# Patient Record
Sex: Male | Born: 1955
Health system: Southern US, Community
[De-identification: ages and names within clinical notes are randomized; demographics above are authoritative.]

## PROBLEM LIST (undated history)

## (undated) DIAGNOSIS — B019 Varicella without complication: Secondary | ICD-10-CM

## (undated) DIAGNOSIS — A389 Scarlet fever, uncomplicated: Secondary | ICD-10-CM

## (undated) DIAGNOSIS — H269 Unspecified cataract: Secondary | ICD-10-CM

## (undated) DIAGNOSIS — B069 Rubella without complication: Secondary | ICD-10-CM

## (undated) DIAGNOSIS — E1065 Type 1 diabetes mellitus with hyperglycemia: Secondary | ICD-10-CM

## (undated) DIAGNOSIS — R519 Headache, unspecified: Secondary | ICD-10-CM

## (undated) DIAGNOSIS — N1411 Contrast-induced nephropathy: Secondary | ICD-10-CM

## (undated) DIAGNOSIS — I509 Heart failure, unspecified: Secondary | ICD-10-CM

## (undated) DIAGNOSIS — I951 Orthostatic hypotension: Secondary | ICD-10-CM

## (undated) DIAGNOSIS — B269 Mumps without complication: Secondary | ICD-10-CM

## (undated) DIAGNOSIS — E785 Hyperlipidemia, unspecified: Secondary | ICD-10-CM

## (undated) DIAGNOSIS — G4733 Obstructive sleep apnea (adult) (pediatric): Principal | ICD-10-CM

## (undated) DIAGNOSIS — T508X5A Adverse effect of diagnostic agents, initial encounter: Secondary | ICD-10-CM

## (undated) DIAGNOSIS — I219 Acute myocardial infarction, unspecified: Secondary | ICD-10-CM

## (undated) DIAGNOSIS — IMO0002 Reserved for concepts with insufficient information to code with codable children: Secondary | ICD-10-CM

## (undated) DIAGNOSIS — E11319 Type 2 diabetes mellitus with unspecified diabetic retinopathy without macular edema: Secondary | ICD-10-CM

## (undated) DIAGNOSIS — I2510 Atherosclerotic heart disease of native coronary artery without angina pectoris: Secondary | ICD-10-CM

## (undated) DIAGNOSIS — N141 Nephropathy induced by other drugs, medicaments and biological substances: Secondary | ICD-10-CM

## (undated) DIAGNOSIS — R51 Headache: Secondary | ICD-10-CM

## (undated) HISTORY — DX: Atherosclerotic heart disease of native coronary artery without angina pectoris: I25.10

## (undated) HISTORY — DX: Varicella without complication: B01.9

## (undated) HISTORY — DX: Headache, unspecified: R51.9

## (undated) HISTORY — DX: Acute myocardial infarction, unspecified: I21.9

## (undated) HISTORY — DX: Scarlet fever, uncomplicated: A38.9

## (undated) HISTORY — DX: Hyperlipidemia, unspecified: E78.5

## (undated) HISTORY — PX: WISDOM TOOTH EXTRACTION: SHX21

## (undated) HISTORY — DX: Type 1 diabetes mellitus with hyperglycemia: E10.65

## (undated) HISTORY — DX: Unspecified cataract: H26.9

## (undated) HISTORY — DX: Mumps without complication: B26.9

## (undated) HISTORY — DX: Obstructive sleep apnea (adult) (pediatric): G47.33

## (undated) HISTORY — DX: Type 2 diabetes mellitus with unspecified diabetic retinopathy without macular edema: E11.319

## (undated) HISTORY — DX: Orthostatic hypotension: I95.1

## (undated) HISTORY — DX: Reserved for concepts with insufficient information to code with codable children: IMO0002

## (undated) HISTORY — DX: Rubella without complication: B06.9

## (undated) HISTORY — DX: Contrast-induced nephropathy: N14.11

## (undated) HISTORY — PX: CORONARY ANGIOPLASTY WITH STENT PLACEMENT: SHX49

## (undated) HISTORY — PX: REFRACTIVE SURGERY: SHX103

## (undated) HISTORY — DX: Adverse effect of diagnostic agents, initial encounter: T50.8X5A

## (undated) HISTORY — DX: Headache: R51

## (undated) HISTORY — DX: Nephropathy induced by other drugs, medicaments and biological substances: N14.1

---
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
## 2012-11-24 DIAGNOSIS — I219 Acute myocardial infarction, unspecified: Secondary | ICD-10-CM

## 2012-11-24 HISTORY — DX: Acute myocardial infarction, unspecified: I21.9

## 2013-03-29 ENCOUNTER — Encounter: Payer: Self-pay | Admitting: Physician Assistant

## 2013-03-29 ENCOUNTER — Ambulatory Visit (INDEPENDENT_AMBULATORY_CARE_PROVIDER_SITE_OTHER): Payer: No Typology Code available for payment source | Admitting: Physician Assistant

## 2013-03-29 VITALS — BP 82/58 | HR 68 | Temp 98.6°F | Resp 16 | Ht 66.0 in | Wt 169.2 lb

## 2013-03-29 DIAGNOSIS — Z299 Encounter for prophylactic measures, unspecified: Secondary | ICD-10-CM

## 2013-03-29 DIAGNOSIS — F3289 Other specified depressive episodes: Secondary | ICD-10-CM

## 2013-03-29 DIAGNOSIS — G4700 Insomnia, unspecified: Secondary | ICD-10-CM

## 2013-03-29 DIAGNOSIS — G589 Mononeuropathy, unspecified: Secondary | ICD-10-CM

## 2013-03-29 DIAGNOSIS — Z7189 Other specified counseling: Secondary | ICD-10-CM

## 2013-03-29 DIAGNOSIS — E785 Hyperlipidemia, unspecified: Secondary | ICD-10-CM

## 2013-03-29 DIAGNOSIS — G2581 Restless legs syndrome: Secondary | ICD-10-CM

## 2013-03-29 DIAGNOSIS — E119 Type 2 diabetes mellitus without complications: Secondary | ICD-10-CM

## 2013-03-29 DIAGNOSIS — F329 Major depressive disorder, single episode, unspecified: Secondary | ICD-10-CM

## 2013-03-29 DIAGNOSIS — G894 Chronic pain syndrome: Secondary | ICD-10-CM

## 2013-03-29 DIAGNOSIS — I951 Orthostatic hypotension: Secondary | ICD-10-CM

## 2013-03-29 DIAGNOSIS — Z7689 Persons encountering health services in other specified circumstances: Secondary | ICD-10-CM

## 2013-03-29 DIAGNOSIS — G629 Polyneuropathy, unspecified: Secondary | ICD-10-CM

## 2013-03-29 DIAGNOSIS — I2510 Atherosclerotic heart disease of native coronary artery without angina pectoris: Secondary | ICD-10-CM

## 2013-03-29 DIAGNOSIS — F32A Depression, unspecified: Secondary | ICD-10-CM

## 2013-03-29 LAB — CBC WITH DIFFERENTIAL/PLATELET
BASOS ABS: 0 10*3/uL (ref 0.0–0.1)
Basophils Relative: 1 % (ref 0–1)
EOS ABS: 0.2 10*3/uL (ref 0.0–0.7)
EOS PCT: 3 % (ref 0–5)
HEMATOCRIT: 39.3 % (ref 39.0–52.0)
Hemoglobin: 13.2 g/dL (ref 13.0–17.0)
LYMPHS ABS: 1.6 10*3/uL (ref 0.7–4.0)
Lymphocytes Relative: 27 % (ref 12–46)
MCH: 28.2 pg (ref 26.0–34.0)
MCHC: 33.6 g/dL (ref 30.0–36.0)
MCV: 84 fL (ref 78.0–100.0)
Monocytes Absolute: 0.4 10*3/uL (ref 0.1–1.0)
Monocytes Relative: 6 % (ref 3–12)
Neutro Abs: 3.8 10*3/uL (ref 1.7–7.7)
Neutrophils Relative %: 63 % (ref 43–77)
Platelets: 226 10*3/uL (ref 150–400)
RBC: 4.68 MIL/uL (ref 4.22–5.81)
RDW: 15 % (ref 11.5–15.5)
WBC: 6 10*3/uL (ref 4.0–10.5)

## 2013-03-29 MED ORDER — HYDROCODONE-ACETAMINOPHEN 10-325 MG PO TABS: 1.0000 | ORAL_TABLET | Freq: Three times a day (TID) | ORAL | Status: DC | PRN

## 2013-03-29 NOTE — Patient Instructions (Addendum)
Please obtain labs.  I will call you with your results. Continue medications.  Increase Midodrine to 10 mg three times daily.  Continue increasing fluids.  You will be contacted by Cardiology, Sleep Study and Pain management.  I will let you know when I have reviewed your records.  Hypertension As your heart beats, it forces blood through your arteries. This force is your blood pressure. If the pressure is too high, it is called hypertension (HTN) or high blood pressure. HTN is dangerous because you may have it and not know it. High blood pressure may mean that your heart has to work harder to pump blood. Your arteries may be narrow or stiff. The extra work puts you at risk for heart disease, stroke, and other problems.  Blood pressure consists of two numbers, a higher number over a lower, 110/72, for example. It is stated as "110 over 72." The ideal is below 120 for the top number (systolic) and under 80 for the bottom (diastolic). Write down your blood pressure today. You should pay close attention to your blood pressure if you have certain conditions such as:  Heart failure.  Prior heart attack.  Diabetes  Chronic kidney disease.  Prior stroke.  Multiple risk factors for heart disease. To see if you have HTN, your blood pressure should be measured while you are seated with your arm held at the level of the heart. It should be measured at least twice. A one-time elevated blood pressure reading (especially in the Emergency Department) does not mean that you need treatment. There may be conditions in which the blood pressure is different between your right and left arms. It is important to see your caregiver soon for a recheck. Most people have essential hypertension which means that there is not a specific cause. This type of high blood pressure may be lowered by changing lifestyle factors such as:  Stress.  Smoking.  Lack of exercise.  Excessive weight.  Drug/tobacco/alcohol  use.  Eating less salt. Most people do not have symptoms from high blood pressure until it has caused damage to the body. Effective treatment can often prevent, delay or reduce that damage. TREATMENT  When a cause has been identified, treatment for high blood pressure is directed at the cause. There are a large number of medications to treat HTN. These fall into several categories, and your caregiver will help you select the medicines that are best for you. Medications may have side effects. You should review side effects with your caregiver. If your blood pressure stays high after you have made lifestyle changes or started on medicines,   Your medication(s) may need to be changed.  Other problems may need to be addressed.  Be certain you understand your prescriptions, and know how and when to take your medicine.  Be sure to follow up with your caregiver within the time frame advised (usually within two weeks) to have your blood pressure rechecked and to review your medications.  If you are taking more than one medicine to lower your blood pressure, make sure you know how and at what times they should be taken. Taking two medicines at the same time can result in blood pressure that is too low. SEEK IMMEDIATE MEDICAL CARE IF:  You develop a severe headache, blurred or changing vision, or confusion.  You have unusual weakness or numbness, or a faint feeling.  You have severe chest or abdominal pain, vomiting, or breathing problems. MAKE SURE YOU:   Understand these instructions.  Will watch your condition.  Will get help right away if you are not doing well or get worse. Document Released: 02/28/2005 Document Revised: 05/23/2011 Document Reviewed: 10/19/2007 [facility] Patient Information 2014 [place].

## 2013-03-29 NOTE — Progress Notes (Signed)
Pre visit review using our clinic review tool, if applicable. No additional management support is needed unless otherwise documented below in the visit note/SLS  

## 2013-03-30 LAB — BASIC METABOLIC PANEL
BUN: 15 mg/dL (ref 6–23)
CHLORIDE: 101 meq/L (ref 96–112)
CO2: 31 meq/L (ref 19–32)
CREATININE: 1.14 mg/dL (ref 0.50–1.35)
Calcium: 8.8 mg/dL (ref 8.4–10.5)
Glucose, Bld: 113 mg/dL — ABNORMAL HIGH (ref 70–99)
Potassium: 4.6 mEq/L (ref 3.5–5.3)
Sodium: 139 mEq/L (ref 135–145)

## 2013-03-30 LAB — FERRITIN: Ferritin: 87 ng/mL (ref 22–322)

## 2013-04-01 ENCOUNTER — Telehealth: Payer: Self-pay | Admitting: *Deleted

## 2013-04-01 NOTE — Telephone Encounter (Signed)
Pt's spouse reports that they forgot to request refill on Sertraline when in office last week; pharmacy states that we need to send Rx in, patient does not need until next week/SLS Will Hold until time for refill.

## 2013-04-02 ENCOUNTER — Telehealth: Payer: Self-pay | Admitting: Physician Assistant

## 2013-04-02 DIAGNOSIS — E1140 Type 2 diabetes mellitus with diabetic neuropathy, unspecified: Secondary | ICD-10-CM

## 2013-04-02 NOTE — Telephone Encounter (Signed)
Patient wife called in stating that she hasn't heard from our office regarding neurology,pain clinic, and cardiology referrals. I do not see any referrals in patient chart.

## 2013-04-02 NOTE — Telephone Encounter (Signed)
Notified pt's wife and she voices understanding. She states pt continues to be in a lot of pain from his restless leg and diabetic neuropathy. They alternated the hydrocodone with tylenol through the night as she states the pain was too intense and pt was not able to go to sleep until 5 am this morning.

## 2013-04-02 NOTE — Telephone Encounter (Signed)
Referrals placed -- The orders had pended for some reason.  I made sure they were signed. They will be contacted hopefully at some point tomorrow or Thursday.  Pain Clinic referral may take longer. I will be back in the morning and call the patient and his wife personally to discuss patient's options for treatment of restless leg syndrome.  His iron level looked good.

## 2013-04-03 MED ORDER — DULOXETINE HCL 30 MG PO CPEP: ORAL_CAPSULE | ORAL | Status: DC

## 2013-04-03 NOTE — Telephone Encounter (Signed)
Spoke with patient and his wife about medications. Will start 30 mg capsule of Cymbalta daily for 7 days, while decreasing Zoloft to 1/2 tablet daily (50 mg) for 1 week.  Will discontinue Zoloft after 7 days, while increasing to 60 mg of the Cymbalta.

## 2013-04-03 NOTE — Telephone Encounter (Signed)
Requesting lab work results °

## 2013-04-05 MED ORDER — HYDROCODONE-ACETAMINOPHEN 10-325 MG PO TABS: 1.0000 | ORAL_TABLET | Freq: Four times a day (QID) | ORAL | Status: DC | PRN

## 2013-04-07 DIAGNOSIS — F32A Depression, unspecified: Secondary | ICD-10-CM | POA: Insufficient documentation

## 2013-04-07 DIAGNOSIS — G4700 Insomnia, unspecified: Secondary | ICD-10-CM | POA: Insufficient documentation

## 2013-04-07 DIAGNOSIS — E785 Hyperlipidemia, unspecified: Secondary | ICD-10-CM | POA: Insufficient documentation

## 2013-04-07 DIAGNOSIS — I951 Orthostatic hypotension: Secondary | ICD-10-CM | POA: Insufficient documentation

## 2013-04-07 DIAGNOSIS — Z9861 Coronary angioplasty status: Secondary | ICD-10-CM

## 2013-04-07 DIAGNOSIS — F329 Major depressive disorder, single episode, unspecified: Secondary | ICD-10-CM | POA: Insufficient documentation

## 2013-04-07 DIAGNOSIS — Z7689 Persons encountering health services in other specified circumstances: Secondary | ICD-10-CM | POA: Insufficient documentation

## 2013-04-07 DIAGNOSIS — I2510 Atherosclerotic heart disease of native coronary artery without angina pectoris: Secondary | ICD-10-CM | POA: Insufficient documentation

## 2013-04-07 DIAGNOSIS — E1129 Type 2 diabetes mellitus with other diabetic kidney complication: Secondary | ICD-10-CM | POA: Insufficient documentation

## 2013-04-07 DIAGNOSIS — G629 Polyneuropathy, unspecified: Secondary | ICD-10-CM | POA: Insufficient documentation

## 2013-04-07 DIAGNOSIS — G894 Chronic pain syndrome: Secondary | ICD-10-CM | POA: Insufficient documentation

## 2013-04-07 NOTE — Assessment & Plan Note (Signed)
Severe pain.  Possible concomitant RLS.  Will check iron level.  Discussed possibility of addition of Cymbalta in place of Sertraline for depressive symptoms to help with neuropathic pain.  Referral to Neurology.

## 2013-04-07 NOTE — Assessment & Plan Note (Signed)
BP low in clinic.  Will make referral to Cardiology for medication management of Anti-HTN agents due to patient's significant history of multiple MI.  Will increase Midodrine to 10 mg TID.  Follow-up in 2 weeks.

## 2013-04-07 NOTE — Assessment & Plan Note (Signed)
Good relief with Ambien. Will continue current medication regimen for now.

## 2013-04-07 NOTE — Assessment & Plan Note (Signed)
Patient followed by Endocrinology.  Will continue current regimen.

## 2013-04-07 NOTE — Assessment & Plan Note (Signed)
Rx Norco.  Will place referral to Pain Clinic in case longer-term pain medications are needed.

## 2013-04-07 NOTE — Progress Notes (Signed)
Patient presents to clinic today to establish care.  Acute Concerns: Diabetic Neuropathy -- Sever pain.  Patient currently on Gabapentin 900 mg at breakfast and dinner and 600 mg at lunch.  Endorses continued symptoms.  Patient needs referral to Neurology and Pain Clinic.  Patient endorses trying multiple medications in the past both oral and topical.  Patient has not been on combination of Cymbalta and Gabapentin. Patient also endorses RLS.  Denies history of iron deficiency.  Worst burning symptoms are in his hands and fingers bilaterally.   Chronic Issues: Diabetes Mellitus -- Patient currently on insulin pump of Humalog. Followed by Endocrinology at [facility].  Patient has complications of neuropathy and retinopathy. Patient followed by Opthalmology. Denies vision changes.  Insomnia -- Patient currently on Ambien nightly.  Patient rests well with medication except when he is having a flare-up of his neuropathic pain.  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 Patient currently on ASA 81 mg, Carvedilol 6.25 mg BID, Plavix 75 mg, Crestor 10 mg daily.  Patient also on Midodrine to help prevent Orthostatic hypotension.  Patient's BP is 82/58 in clinic today.  Denies lightheadedness, dizziness, chest pain, palpitations or shortness of breath.  Needs cardiology referral placed.  Health Maintenance: Dental -- UTD Vision -- UTD Immunizations -- UTD Colonoscopy -- UTD.  Past Medical History  Diagnosis Date  . Diabetes type 1, uncontrolled   . Frequent headaches   . Heart disease   . Heart attack 09.13.2014    x5  . Hypertension   . Hyperlipidemia   . Chicken pox   . Mumps   . Measles, [place] (rubella)   . Scarlet fever   . Diabetic retinopathy   . Cataracts, bilateral     Past Surgical History  Procedure Laterality Date  . Coronary angioplasty with stent placement    . Appendectomy  1966  . Wisdom tooth extraction    . Refractive  surgery      Retinopathy    No current outpatient prescriptions on file prior to visit.   No current facility-administered medications on file prior to visit.    No Known Allergies  Family History  Problem Relation Age of Onset  . Vascular Disease Father 49    Deceased  . Transient ischemic attack Father   . Hypertension  74    Deceased  . Liver cancer Paternal Grandfather   . Breast cancer Sister   . Heart disease Sister   . Healthy Brother     x1    History   Social History  . Marital Status: Married    Spouse Name: N/A    Number of Children: N/A  . Years of Education: N/A   Occupational History  . Not on file.   Social History Main Topics  . Smoking status: Never Smoker   . Smokeless tobacco: Not on file  . Alcohol Use: Not on file  . Drug Use: Not on file  . Sexual Activity: Not on file   Other Topics Concern  . Not on file   Social History Narrative  . No narrative on file   Review of Systems  Constitutional: Positive for malaise/fatigue. Negative for fever and weight loss.  HENT: Negative for ear pain, hearing loss and tinnitus.   Eyes: Negative for blurred vision, double vision, photophobia and pain.  Respiratory: Negative for cough, shortness of breath and wheezing.   Cardiovascular: Negative for chest pain and palpitations.  Gastrointestinal: Negative for heartburn, nausea, vomiting, abdominal pain, diarrhea, constipation, blood in stool and melena.  Genitourinary: Negative.   Musculoskeletal: Positive for myalgias.  Neurological: Positive for tingling and sensory change. Negative for dizziness, tremors, speech change, focal weakness, seizures, loss of consciousness and headaches.  Endo/Heme/Allergies: Negative for environmental allergies.  Psychiatric/Behavioral: Positive for depression. Negative for suicidal ideas, hallucinations and substance abuse. The patient has insomnia. The patient is not nervous/anxious.    BP 82/58  Pulse 68   Temp(Src) 98.6 F (37 C) (Oral)  Resp 16  Ht 5' 6"  (1.676 m)  Wt 169 lb 4 oz (76.771 kg)  BMI 27.33 kg/m2  SpO2 98%  Physical Exam  Vitals reviewed. Constitutional: He is oriented to person, place, and time and well-developed, well-nourished, and in no distress.  HENT:  Head: Normocephalic and atraumatic.  Right Ear: External ear normal.  Left Ear: External ear normal.  Nose: Nose normal.  Mouth/Throat: Oropharynx is clear and moist. No oropharyngeal exudate.  Eyes: Conjunctivae are normal. Pupils are equal, round, and reactive to light.  Neck: Neck supple. No thyromegaly present.  Cardiovascular: Normal rate, regular rhythm, normal heart sounds and intact distal pulses.   Pulmonary/Chest: Effort normal and breath sounds normal. No respiratory distress. He has no wheezes. He has no rales. He exhibits no tenderness.  Lymphadenopathy:    He has no cervical adenopathy.  Neurological: He is alert and oriented to person, place, and time. No cranial nerve deficit.  Skin: Skin is warm and dry. No rash noted.  Psychiatric: Affect normal.    Recent Results (from the past 2160 hour(s))  CBC WITH DIFFERENTIAL     Status: None   Collection Time    03/29/13  5:23 PM      Result Value Range   WBC 6.0  4.0 - 10.5 K/uL   RBC 4.68  4.22 - 5.81 MIL/uL   Hemoglobin 13.2  13.0 - 17.0 g/dL   HCT 39.3  39.0 - 52.0 %   MCV 84.0  78.0 - 100.0 fL   MCH 28.2  26.0 - 34.0 pg   MCHC 33.6  30.0 - 36.0 g/dL   RDW 15.0  11.5 - 15.5 %   Platelets 226  150 - 400 K/uL   Neutrophils Relative % 63  43 - 77 %   Neutro Abs 3.8  1.7 - 7.7 K/uL   Lymphocytes Relative 27  12 - 46 %   Lymphs Abs 1.6  0.7 - 4.0 K/uL   Monocytes Relative 6  3 - 12 %   Monocytes Absolute 0.4  0.1 - 1.0 K/uL   Eosinophils Relative 3  0 - 5 %   Eosinophils Absolute 0.2  0.0 - 0.7 K/uL   Basophils Relative 1  0 - 1 %   Basophils Absolute 0.0  0.0 - 0.1 K/uL   Smear Review Criteria for review not met    BASIC METABOLIC PANEL      Status: Abnormal   Collection Time    03/29/13  5:23 PM      Result Value Range   Sodium 139  135 - 145 mEq/L   Potassium 4.6  3.5 - 5.3 mEq/L   Chloride 101  96 - 112 mEq/L   CO2 31  19 - 32 mEq/L   Glucose, Bld 113 (*) 70 - 99 mg/dL   BUN 15  6 - 23 mg/dL   Creat 1.14  0.50 - 1.35 mg/dL   Calcium 8.8  8.4 - 10.5  mg/dL  FERRITIN     Status: None   Collection Time    03/29/13  5:23 PM      Result Value Range   Ferritin 87  22 - 322 ng/mL    Assessment/Plan: No problem-specific assessment & plan notes found for this encounter.

## 2013-04-07 NOTE — Assessment & Plan Note (Signed)
Symptoms controlled on Sertraline.

## 2013-04-07 NOTE — Assessment & Plan Note (Signed)
Medical history reviewed.  Health Maintenance UTD.  Will obtain records from previous PCP and specialists.  Will obtain labs.

## 2013-04-07 NOTE — Assessment & Plan Note (Signed)
Continue current medications.  Will place referral to Cardiology.

## 2013-04-07 NOTE — Assessment & Plan Note (Signed)
Patient currently on Crestor.  Denies myalgias.

## 2013-04-08 ENCOUNTER — Telehealth: Payer: Self-pay | Admitting: *Deleted

## 2013-04-08 DIAGNOSIS — R945 Abnormal results of liver function studies: Principal | ICD-10-CM

## 2013-04-08 DIAGNOSIS — R748 Abnormal levels of other serum enzymes: Secondary | ICD-10-CM

## 2013-04-08 DIAGNOSIS — R7989 Other specified abnormal findings of blood chemistry: Secondary | ICD-10-CM

## 2013-04-08 NOTE — Telephone Encounter (Signed)
LMOM with contact name and number for return call RE: scheduling appointment and further provider instructions/SLS

## 2013-04-08 NOTE — Telephone Encounter (Signed)
Pain Clinic referral in place.  Patient started on new pain medications which will take time to work effectively.  Tessalon Perles should be ok.  I do think he should be seen for an appointment.  Make it a 30-minute appointment as patient is very complex.

## 2013-04-08 NOTE — Telephone Encounter (Signed)
Returning call, states no message was left

## 2013-04-08 NOTE — Telephone Encounter (Signed)
Timothy ClimesWilliam Cody Martin, PA-C at 04/03/2013 6:35 PM     Status: Signed        Spoke with patient and his wife about medications. Will start 30 mg capsule of Cymbalta daily for 7 days, while decreasing Zoloft to 1/2 tablet daily (50 mg) for 1 week. Will discontinue Zoloft after 7 days, while increasing to 60 mg of the Cymbalta.

## 2013-04-08 NOTE — Telephone Encounter (Signed)
Patient's spouse called reporting that: 1) pt's pain [where??] was bad this weekend, but that he had taken the medication px for it 2) Pt's CBG was elevated on Saturday up to 498, but now resolved 3) Pt started coughing bad last night, she states as a nurse, she checked him with her stethoscope and found No wheezing and/or rails; called in refill on tussin pearles [?? Not on med list] to pharmacy; is Rx Ok to take with other medications? 4) Pt's cough is dry and with chest pain [from cough], does patient need to come in for appointment [?], as would like to avoid another respiratory infection like one he had in hospital [?] Please Advise/SLS

## 2013-04-09 ENCOUNTER — Emergency Department (HOSPITAL_BASED_OUTPATIENT_CLINIC_OR_DEPARTMENT_OTHER)
Admission: EM | Admit: 2013-04-09 | Discharge: 2013-04-09 | Disposition: A | Discharge status: Home / Self Care | Payer: No Typology Code available for payment source | Attending: Emergency Medicine | Admitting: Emergency Medicine

## 2013-04-09 ENCOUNTER — Encounter: Payer: Self-pay | Admitting: Family

## 2013-04-09 ENCOUNTER — Ambulatory Visit (INDEPENDENT_AMBULATORY_CARE_PROVIDER_SITE_OTHER): Payer: No Typology Code available for payment source | Admitting: Family

## 2013-04-09 ENCOUNTER — Emergency Department (HOSPITAL_BASED_OUTPATIENT_CLINIC_OR_DEPARTMENT_OTHER): Payer: No Typology Code available for payment source

## 2013-04-09 ENCOUNTER — Encounter (HOSPITAL_BASED_OUTPATIENT_CLINIC_OR_DEPARTMENT_OTHER): Payer: Self-pay | Admitting: Emergency Medicine

## 2013-04-09 VITALS — BP 156/78 | HR 71 | Temp 98.2°F | Resp 16 | Ht 66.0 in | Wt 173.1 lb

## 2013-04-09 DIAGNOSIS — IMO0002 Reserved for concepts with insufficient information to code with codable children: Secondary | ICD-10-CM | POA: Insufficient documentation

## 2013-04-09 DIAGNOSIS — Z8619 Personal history of other infectious and parasitic diseases: Secondary | ICD-10-CM | POA: Insufficient documentation

## 2013-04-09 DIAGNOSIS — E1039 Type 1 diabetes mellitus with other diabetic ophthalmic complication: Secondary | ICD-10-CM | POA: Insufficient documentation

## 2013-04-09 DIAGNOSIS — I252 Old myocardial infarction: Secondary | ICD-10-CM | POA: Insufficient documentation

## 2013-04-09 DIAGNOSIS — J189 Pneumonia, unspecified organism: Secondary | ICD-10-CM

## 2013-04-09 DIAGNOSIS — Z7982 Long term (current) use of aspirin: Secondary | ICD-10-CM | POA: Insufficient documentation

## 2013-04-09 DIAGNOSIS — R0789 Other chest pain: Secondary | ICD-10-CM

## 2013-04-09 DIAGNOSIS — J159 Unspecified bacterial pneumonia: Secondary | ICD-10-CM | POA: Insufficient documentation

## 2013-04-09 DIAGNOSIS — R059 Cough, unspecified: Secondary | ICD-10-CM

## 2013-04-09 DIAGNOSIS — I10 Essential (primary) hypertension: Secondary | ICD-10-CM | POA: Insufficient documentation

## 2013-04-09 DIAGNOSIS — Z79899 Other long term (current) drug therapy: Secondary | ICD-10-CM | POA: Insufficient documentation

## 2013-04-09 DIAGNOSIS — Z7902 Long term (current) use of antithrombotics/antiplatelets: Secondary | ICD-10-CM | POA: Insufficient documentation

## 2013-04-09 DIAGNOSIS — E11319 Type 2 diabetes mellitus with unspecified diabetic retinopathy without macular edema: Secondary | ICD-10-CM | POA: Insufficient documentation

## 2013-04-09 DIAGNOSIS — R05 Cough: Secondary | ICD-10-CM | POA: Insufficient documentation

## 2013-04-09 DIAGNOSIS — H269 Unspecified cataract: Secondary | ICD-10-CM | POA: Insufficient documentation

## 2013-04-09 DIAGNOSIS — R079 Chest pain, unspecified: Secondary | ICD-10-CM

## 2013-04-09 DIAGNOSIS — Z794 Long term (current) use of insulin: Secondary | ICD-10-CM | POA: Insufficient documentation

## 2013-04-09 DIAGNOSIS — E785 Hyperlipidemia, unspecified: Secondary | ICD-10-CM | POA: Insufficient documentation

## 2013-04-09 DIAGNOSIS — E1065 Type 1 diabetes mellitus with hyperglycemia: Secondary | ICD-10-CM

## 2013-04-09 LAB — BASIC METABOLIC PANEL
BUN: 15 mg/dL (ref 6–23)
CALCIUM: 9 mg/dL (ref 8.4–10.5)
CO2: 26 mEq/L (ref 19–32)
Chloride: 99 mEq/L (ref 96–112)
Creatinine, Ser: 0.8 mg/dL (ref 0.50–1.35)
GFR calc Af Amer: >90 mL/min (ref >90)
GLUCOSE: 260 mg/dL — AB (ref 70–99)
Potassium: 4.2 mEq/L (ref 3.7–5.3)
SODIUM: 139 meq/L (ref 137–147)

## 2013-04-09 LAB — CBC WITH DIFFERENTIAL/PLATELET
Basophils Absolute: 0 10*3/uL (ref 0.0–0.1)
Basophils Relative: 1 % (ref 0–1)
EOS ABS: 0.2 10*3/uL (ref 0.0–0.7)
Eosinophils Relative: 4 % (ref 0–5)
HCT: 35.3 % — ABNORMAL LOW (ref 39.0–52.0)
Hemoglobin: 11.8 g/dL — ABNORMAL LOW (ref 13.0–17.0)
LYMPHS ABS: 1.1 10*3/uL (ref 0.7–4.0)
Lymphocytes Relative: 19 % (ref 12–46)
MCH: 28.9 pg (ref 26.0–34.0)
MCHC: 33.4 g/dL (ref 30.0–36.0)
MCV: 86.3 fL (ref 78.0–100.0)
MONOS PCT: 10 % (ref 3–12)
Monocytes Absolute: 0.6 10*3/uL (ref 0.1–1.0)
Neutro Abs: 3.9 10*3/uL (ref 1.7–7.7)
Neutrophils Relative %: 67 % (ref 43–77)
Platelets: 204 10*3/uL (ref 150–400)
RBC: 4.09 MIL/uL — AB (ref 4.22–5.81)
RDW: 13.4 % (ref 11.5–15.5)
WBC: 5.8 10*3/uL (ref 4.0–10.5)

## 2013-04-09 LAB — TROPONIN I: Troponin I: <0.3 ng/mL (ref <0.30)

## 2013-04-09 MED ORDER — FLUDROCORTISONE ACETATE 0.1 MG PO TABS: 0.1000 mg | ORAL_TABLET | Freq: Two times a day (BID) | ORAL | Status: DC

## 2013-04-09 MED ORDER — CEFTRIAXONE SODIUM 1 G IJ SOLR
INTRAMUSCULAR | Status: AC
  Filled 2013-04-09: qty 10

## 2013-04-09 MED ORDER — AZITHROMYCIN 250 MG PO TABS: 250.0000 mg | ORAL_TABLET | Freq: Every day | ORAL | Status: DC

## 2013-04-09 MED ORDER — AZITHROMYCIN 250 MG PO TABS
500.0000 mg | ORAL_TABLET | Freq: Once | ORAL | Status: AC
  Administered 2013-04-09: 500 mg via ORAL
  Filled 2013-04-09: qty 2

## 2013-04-09 MED ORDER — ONDANSETRON HCL 4 MG/2ML IJ SOLN
4.0000 mg | Freq: Once | INTRAMUSCULAR | Status: AC
  Administered 2013-04-09: 4 mg via INTRAMUSCULAR
  Filled 2013-04-09: qty 2

## 2013-04-09 MED ORDER — DEXTROSE 5 % IV SOLN
1.0000 g | INTRAVENOUS | Status: DC
  Administered 2013-04-09: 1 g via INTRAVENOUS

## 2013-04-09 MED ORDER — HYDROMORPHONE HCL PF 1 MG/ML IJ SOLN
1.0000 mg | Freq: Once | INTRAMUSCULAR | Status: AC
  Administered 2013-04-09: 1 mg via INTRAVENOUS
  Filled 2013-04-09: qty 1

## 2013-04-09 MED ORDER — HYDROCODONE-ACETAMINOPHEN 5-325 MG PO TABS: 2.0000 | ORAL_TABLET | ORAL | Status: DC | PRN

## 2013-04-09 NOTE — Progress Notes (Signed)
Pre visit review using our clinic review tool, if applicable. No additional management support is needed unless otherwise documented below in the visit note. 

## 2013-04-09 NOTE — Telephone Encounter (Signed)
Pt was seen in clinic by NP today for cough. Do you still need to see him for a 30 minute visit?

## 2013-04-09 NOTE — ED Provider Notes (Signed)
CSN: [id number]     Arrival date & time 04/09/13  1442 History   First MD Initiated Contact with Patient 04/09/13 1548     Chief Complaint  Patient presents with  . Shortness of Breath   (Consider location/radiation/quality/duration/timing/severity/associated sxs/prior Treatment) Patient is a 58 y.o. male presenting with shortness of breath. The history is provided by the patient. No language interpreter was used.  Shortness of Breath Severity:  Moderate Onset quality:  Gradual Duration:  3 days Timing:  Constant Progression:  Worsening Chronicity:  New Relieved by:  Nothing Worsened by:  Nothing tried Ineffective treatments:  None tried Associated symptoms: cough   Associated symptoms: no vomiting   Pt complains of a cough and congestion.  Pt reports he is short of breath today.    Past Medical History  Diagnosis Date  . Diabetes type 1, uncontrolled   . Frequent headaches   . Heart disease   . Heart attack 09.13.2014    x5  . Hypertension   . Hyperlipidemia   . Chicken pox   . Mumps   . Measles, [place] (rubella)   . Scarlet fever   . Diabetic retinopathy   . Cataracts, bilateral    Past Surgical History  Procedure Laterality Date  . Coronary angioplasty with stent placement    . Appendectomy  1966  . Wisdom tooth extraction    . Refractive surgery      Retinopathy   Family History  Problem Relation Age of Onset  . Vascular Disease Father 43    Deceased  . Transient ischemic attack Father   . Hypertension  38    Deceased  . Liver cancer Paternal Grandfather   . Breast cancer Sister   . Heart disease Sister   . Healthy Brother     x1   History  Substance Use Topics  . Smoking status: Never Smoker   . Smokeless tobacco: Not on file  . Alcohol Use: Yes    Review of Systems  Respiratory: Positive for cough and shortness of breath.   Gastrointestinal: Negative for vomiting.  All other systems reviewed and are negative.    Allergies  Review of  patient's allergies indicates no known allergies.  Home Medications   Current Outpatient Rx  Name  Route  Sig  Dispense  Refill  . aspirin 81 MG tablet   Oral   Take 81 mg by mouth daily.         . benzonatate (TESSALON) 100 MG capsule   Oral   Take by mouth 3 (three) times daily as needed for cough.         . carvedilol (COREG) 12.5 MG tablet   Oral   Take 6.25 mg by mouth 2 (two) times daily with a meal.         . clopidogrel (PLAVIX) 75 MG tablet   Oral   Take 75 mg by mouth daily with breakfast.         . co-enzyme Q-10 30 MG capsule   Oral   Take 10 mg by mouth daily.         . DULoxetine (CYMBALTA) 30 MG capsule      Take 1 capsule by mouth for 7 days (25m).  Then increase to 2 capsules daily (615m.   60 capsule   0   . fludrocortisone (FLORINEF) 0.1 MG tablet   Oral   Take 1 tablet (0.1 mg total) by mouth 2 (two) times daily.   60 tablet  2   . gabapentin (NEURONTIN) 300 MG capsule   Oral   Take 300 mg by mouth as directed. Take 2 caps at Breakfast and 3 caps at Night         . glucagon 1 MG injection   Intravenous   Inject 1 mg into the vein once as needed (Emergency Test Kit).         Marland Kitchen glucose blood test strip   Other   1 each by Other route as directed. Use as instructed FREESTYLE IN VITRO         . HYDROcodone-acetaminophen (NORCO) 10-325 MG per tablet   Oral   Take 1 tablet by mouth every 6 (six) hours as needed for moderate pain or severe pain (pain).   60 tablet   0     Ok to fill on 04/05/13   . insulin lispro (HUMALOG) 100 UNIT/ML injection   Subcutaneous   Inject into the skin as directed. Use in Insulin Pump, Mas daily dose: 100 units/day         . L-Methylfolate-Algae-B12-B6 (METANX) 3-90.314-2-35 MG CAPS   Oral   Take 1 capsule by mouth 2 (two) times daily.         . midodrine (PROAMATINE) 10 MG tablet   Oral   Take 10 mg by mouth 2 (two) times daily.         . rosuvastatin (CRESTOR) 10 MG tablet    Oral   Take 10 mg by mouth daily.         Marland Kitchen zolpidem (AMBIEN CR) 12.5 MG CR tablet   Oral   Take 12.5 mg by mouth at bedtime.          BP 165/71  Pulse 73  Temp(Src) 98.3 F (36.8 C) (Oral)  Resp 16  Ht _0  (1.676 m)  Wt 173 lb (78.472 kg)  BMI 27.94 kg/m2  SpO2 95% Physical Exam  Nursing note and vitals reviewed. Constitutional: He is oriented to person, place, and time. He appears well-developed and well-nourished.  HENT:  Head: Normocephalic and atraumatic.  Right Ear: External ear normal.  Left Ear: External ear normal.  Nose: Nose normal.  Mouth/Throat: Oropharynx is clear and moist.  Eyes: Conjunctivae and EOM are normal. Pupils are equal, round, and reactive to light.  Neck: Normal range of motion. Neck supple.  Cardiovascular: Normal rate, regular rhythm and normal heart sounds.   Pulmonary/Chest: Effort normal and breath sounds normal. He exhibits tenderness.  Abdominal: He exhibits no distension.  Musculoskeletal: Normal range of motion.  Neurological: He is alert and oriented to person, place, and time.  Skin: Skin is warm.  Psychiatric: He has a normal mood and affect.    ED Course  Procedures (including critical care time) Labs Review Labs Reviewed  CBC WITH DIFFERENTIAL - Abnormal; Notable for the following:    RBC 4.09 (*)    Hemoglobin 11.8 (*)    HCT 35.3 (*)    All other components within normal limits  BASIC METABOLIC PANEL  TROPONIN I   Imaging Review Dg Chest 2 View  04/09/2013   CLINICAL DATA:  Cough, shortness of Breath  EXAM: CHEST  2 VIEW  COMPARISON:  01/22/2013  FINDINGS: Cardiomediastinal silhouette is stable. There is bilateral perihilar streaky airspace disease. Pneumonitis or perihilar infiltrates cannot be excluded. Follow-up to resolution recommended. No convincing pulmonary edema.  IMPRESSION: There is bilateral perihilar streaky airspace disease. Pneumonitis or perihilar infiltrates cannot be excluded. Follow-up to resolution  recommended.  No convincing pulmonary edema.   Electronically Signed   By: Lahoma Crocker M.D.   On: 04/09/2013 16:43    EKG Interpretation   None     Pt reports some relief from pain medication.   Pt counseled on pneumonia and follow up  MDM   1. Community acquired pneumonia     Perihilar infiltrates bilat,   Pt given rocephin and zithromax.   PORT score is 69 points,  Risk class II outpt tx reasonable.    Pt given rx for zithromax.  Hydrocodone for pain and cough   [place], [place] 04/09/13 2021

## 2013-04-09 NOTE — Assessment & Plan Note (Signed)
Likely viral etiology, but will need CXR- will defer to ED staff.

## 2013-04-09 NOTE — Telephone Encounter (Signed)
I had just told them to make him a 30-minute patient as he is complex.  He is supposed to have scheduled follow-up in a few weeks from last visit since we started new medications

## 2013-04-09 NOTE — Discharge Instructions (Signed)

## 2013-04-09 NOTE — Assessment & Plan Note (Addendum)
Pt has strong hx of CAD.  EKG performed today in the office notes anterior fascicular block.   No acute changes noted.  Unfortunately, there is no EKG on file for comparison.  While his chest pain and SOB could be pulmonary in nature, I am concerned re: his strong cardiac hx and have recommended that he proceed to the ED for further evaluation and to rule out MI.  Pt and wife are agreeable.  Report given to charge nurse in ED.

## 2013-04-09 NOTE — Progress Notes (Signed)
Subjective:    Patient ID: Timothy Alvarez, male    DOB: [birth year], 58 y.o.   MRN: [id number]  HPI  Mr. Bencomo is a 58 yr old male who presents today with chief complaint of cough. Cough started 2 days ago.  Wife reports that cough is dry. Wife reports improvement with tessalon pearls.  Pt reports that it feels "like a dog sitting on my chest."  Cough is worse with exertion.  He reports + DOE which is new for him. DOE started 2 days ago.  Friday afternoon his pain worsened (shoulders and hands- has peripheral neuropathy.) Wife reports that pt needs help since his last MI in September ([facility]) with his ADL's.  Reports that his blood sugars went into the 400's this weekend his pain.  Wife had to increas his pain medication dosing.  Reports that his blood sugars have been around 165.  Pt has had low grade temp 99.5 last night with associated chills.  He did have a flu shot this year.    Moved here from [facility].   He has upcoming apts for consult with neurology and cardiology.    Review of Systems    see HPI  Past Medical History  Diagnosis Date  . Diabetes type 1, uncontrolled   . Frequent headaches   . Heart disease   . Heart attack 09.13.2014    x5  . Hypertension   . Hyperlipidemia   . Chicken pox   . Mumps   . Measles, [place] (rubella)   . Scarlet fever   . Diabetic retinopathy   . Cataracts, bilateral     History   Social History  . Marital Status: Married    Spouse Name: N/A    Number of Children: N/A  . Years of Education: N/A   Occupational History  . Not on file.   Social History Main Topics  . Smoking status: Never Smoker   . Smokeless tobacco: Not on file  . Alcohol Use: Not on file  . Drug Use: Not on file  . Sexual Activity: Not on file   Other Topics Concern  . Not on file   Social History Narrative  . No narrative on file    Past Surgical History  Procedure Laterality Date  . Coronary angioplasty with stent placement    . Appendectomy   1966  . Wisdom tooth extraction    . Refractive surgery      Retinopathy    Family History  Problem Relation Age of Onset  . Vascular Disease Father 24    Deceased  . Transient ischemic attack Father   . Hypertension  31    Deceased  . Liver cancer Paternal Grandfather   . Breast cancer Sister   . Heart disease Sister   . Healthy Brother     x1    No Known Allergies  Current Outpatient Prescriptions on File Prior to Visit  Medication Sig Dispense Refill  . aspirin 81 MG tablet Take 81 mg by mouth daily.      . carvedilol (COREG) 12.5 MG tablet Take 6.25 mg by mouth 2 (two) times daily with a meal.      . clopidogrel (PLAVIX) 75 MG tablet Take 75 mg by mouth daily with breakfast.      . co-enzyme Q-10 30 MG capsule Take 10 mg by mouth daily.      . DULoxetine (CYMBALTA) 30 MG capsule Take 1 capsule by mouth for 7 days (64m).  Then increase to 2 capsules daily (32m).  60 capsule  0  . fludrocortisone (FLORINEF) 0.1 MG tablet Take 0.1 mg by mouth 2 (two) times daily.      .Marland Kitchengabapentin (NEURONTIN) 300 MG capsule Take 300 mg by mouth as directed. Take 2 caps at Breakfast and 3 caps at Night      . glucagon 1 MG injection Inject 1 mg into the vein once as needed (Emergency Test Kit).      .Marland Kitchenglucose blood test strip 1 each by Other route as directed. Use as instructed FREESTYLE IN VITRO      . HYDROcodone-acetaminophen (NORCO) 10-325 MG per tablet Take 1 tablet by mouth every 6 (six) hours as needed for moderate pain or severe pain (pain).  60 tablet  0  . insulin lispro (HUMALOG) 100 UNIT/ML injection Inject into the skin as directed. Use in Insulin Pump, Mas daily dose: 100 units/day      . L-Methylfolate-Algae-B12-B6 (METANX) 3-90.314-2-35 MG CAPS Take 1 capsule by mouth 2 (two) times daily.      . midodrine (PROAMATINE) 10 MG tablet Take 10 mg by mouth 2 (two) times daily.      . rosuvastatin (CRESTOR) 10 MG tablet Take 10 mg by mouth daily.      .Marland Kitchenzolpidem (AMBIEN CR) 12.5 MG CR  tablet Take 12.5 mg by mouth at bedtime.       No current facility-administered medications on file prior to visit.    BP 156/78  Pulse 71  Temp(Src) 98.2 F (36.8 C) (Oral)  Resp 16  Ht 5' 6"  (1.676 m)  Wt 173 lb 1.9 oz (78.527 kg)  BMI 27.96 kg/m2  SpO2 94%    Objective:   Physical Exam  Constitutional: He is oriented to person, place, and time.  Chronically ill appearing white male, appears tired but in NAD.  Laying on exam table  HENT:  Head: Normocephalic and atraumatic.  Right Ear: Tympanic membrane and ear canal normal.  Left Ear: Tympanic membrane and ear canal normal.  Mouth/Throat: No oropharyngeal exudate, posterior oropharyngeal edema or posterior oropharyngeal erythema.  Eyes: No scleral icterus.  Pulmonary/Chest: Effort normal and breath sounds normal.  Abdominal: Soft. Bowel sounds are normal.  Musculoskeletal: He exhibits no edema.  Neurological: He is alert and oriented to person, place, and time.  Skin: Skin is warm and dry.  Psychiatric: He has a normal mood and affect. His behavior is normal. Judgment and thought content normal.          Assessment & Plan:

## 2013-04-09 NOTE — ED Provider Notes (Signed)
Medical screening examination/treatment/procedure(s) were performed by non-physician practitioner and as supervising physician I was immediately available for consultation/collaboration.  EKG Interpretation   None         Noe Goyer N Jernie Schutt, DO 04/09/13 2328 

## 2013-04-09 NOTE — Patient Instructions (Signed)
Please proceed directly the ED on the first floor.

## 2013-04-09 NOTE — ED Notes (Signed)
Pt sent from [facility] in building-pt states he has hx of chronic bilat shoulder and arm pain-worse over the last 3-4 days-also c/o SOB, cough, elevated BS over the past 3-4 days-pt states once he c/o SOB with PCP, pt was sent to ED

## 2013-04-10 NOTE — Telephone Encounter (Signed)
Patient scheduled an ED follow up for Friday.

## 2013-04-12 ENCOUNTER — Encounter: Payer: Self-pay | Admitting: Physician Assistant

## 2013-04-12 ENCOUNTER — Ambulatory Visit (INDEPENDENT_AMBULATORY_CARE_PROVIDER_SITE_OTHER): Payer: No Typology Code available for payment source | Admitting: Physician Assistant

## 2013-04-12 VITALS — BP 133/72 | HR 74 | Temp 98.2°F | Resp 16 | Ht 66.0 in | Wt 168.0 lb

## 2013-04-12 DIAGNOSIS — G894 Chronic pain syndrome: Secondary | ICD-10-CM

## 2013-04-12 DIAGNOSIS — J189 Pneumonia, unspecified organism: Secondary | ICD-10-CM

## 2013-04-12 DIAGNOSIS — I951 Orthostatic hypotension: Secondary | ICD-10-CM

## 2013-04-12 DIAGNOSIS — R899 Unspecified abnormal finding in specimens from other organs, systems and tissues: Secondary | ICD-10-CM

## 2013-04-12 DIAGNOSIS — R6889 Other general symptoms and signs: Secondary | ICD-10-CM

## 2013-04-12 DIAGNOSIS — E109 Type 1 diabetes mellitus without complications: Secondary | ICD-10-CM

## 2013-04-12 LAB — COMPLETE METABOLIC PANEL WITH GFR
ALT: 145 U/L — AB (ref 0–53)
AST: 69 U/L — AB (ref 0–37)
Albumin: 3.5 g/dL (ref 3.5–5.2)
Alkaline Phosphatase: 198 U/L — ABNORMAL HIGH (ref 39–117)
BUN: 15 mg/dL (ref 6–23)
CO2: 29 meq/L (ref 19–32)
CREATININE: 1.08 mg/dL (ref 0.50–1.35)
Calcium: 8.8 mg/dL (ref 8.4–10.5)
Chloride: 101 mEq/L (ref 96–112)
GFR, Est African American: 86 mL/min
GFR, Est Non African American: 75 mL/min
Glucose, Bld: 370 mg/dL — ABNORMAL HIGH (ref 70–99)
Potassium: 4.4 mEq/L (ref 3.5–5.3)
Sodium: 137 mEq/L (ref 135–145)
Total Bilirubin: 0.7 mg/dL (ref 0.2–1.2)
Total Protein: 6.2 g/dL (ref 6.0–8.3)

## 2013-04-12 LAB — CBC WITH DIFFERENTIAL/PLATELET
BASOS ABS: 0 10*3/uL (ref 0.0–0.1)
Basophils Relative: 0 % (ref 0–1)
EOS ABS: 0.1 10*3/uL (ref 0.0–0.7)
EOS PCT: 2 % (ref 0–5)
HCT: 37.3 % — ABNORMAL LOW (ref 39.0–52.0)
Hemoglobin: 12.3 g/dL — ABNORMAL LOW (ref 13.0–17.0)
Lymphocytes Relative: 10 % — ABNORMAL LOW (ref 12–46)
Lymphs Abs: 0.9 10*3/uL (ref 0.7–4.0)
MCH: 28.2 pg (ref 26.0–34.0)
MCHC: 33 g/dL (ref 30.0–36.0)
MCV: 85.6 fL (ref 78.0–100.0)
Monocytes Absolute: 0.6 10*3/uL (ref 0.1–1.0)
Monocytes Relative: 6 % (ref 3–12)
Neutro Abs: 7.1 10*3/uL (ref 1.7–7.7)
Neutrophils Relative %: 82 % — ABNORMAL HIGH (ref 43–77)
PLATELETS: 282 10*3/uL (ref 150–400)
RBC: 4.36 MIL/uL (ref 4.22–5.81)
RDW: 15.1 % (ref 11.5–15.5)
WBC: 8.7 10*3/uL (ref 4.0–10.5)

## 2013-04-12 MED ORDER — OXYCODONE HCL 5 MG PO CAPS: 5.0000 mg | ORAL_CAPSULE | Freq: Four times a day (QID) | ORAL | Status: DC | PRN

## 2013-04-12 NOTE — Patient Instructions (Signed)
Please finish antibiotic.  Increase fluid intake.  Rest.  Saline nasal spray. Place a humidifier in the bedroom.  Please obtain labs. I will call you with your results. Return in 1 month for follow-up.  Hopefully you will hear from [facility] soon.

## 2013-04-12 NOTE — Progress Notes (Signed)
Pre visit review using our clinic review tool, if applicable. No additional management support is needed unless otherwise documented below in the visit note/SLS  

## 2013-04-12 NOTE — Progress Notes (Signed)
Patient presents to clinic today for ER follow-up.  Patient was seen in clinic by NP on 04/09/13 for chest pressure and fatigue.  Giving patient's chest complaints and history of numerous MI, patient was sent to the ER for more immediate evaluation of symptoms.  Patient was diagnosed with Pneumonia via CXR.  Started on IV Rocephin and Azithromycin.  Additional workup was negative for acute findings.  Patient was discharged from the ER with PO Azithromycin and RX for tessalon perles.  Patient given instruction to follow-up with PCP.  Since ER discharge, patient states he has been doing better.  Still endorses a cough but is now non-productive.  Denies fever, chills sweats, chest pain or shortness of breath.  Feels his energy is coming back.  Is taking antibiotic as prescribed.  Has a few more days of antibiotic left.  Patient states he was given a refill of his Norco by the ER physician.  The pharmacy would not allow the prescription to be filled stating he should not have tylenol due to liver function.  There is no abnormal liver function tests in our records.  Will need to change medication until labs can be obtained.  Past Medical History  Diagnosis Date  . Diabetes type 1, uncontrolled   . Frequent headaches   . Heart disease   . Heart attack 09.13.2014    x5  . Hypertension   . Hyperlipidemia   . Chicken pox   . Mumps   . Measles, [place] (rubella)   . Scarlet fever   . Diabetic retinopathy   . Cataracts, bilateral     Current Outpatient Prescriptions on File Prior to Visit  Medication Sig Dispense Refill  . aspirin 81 MG tablet Take 81 mg by mouth daily.      Marland Kitchen azithromycin (ZITHROMAX) 250 MG tablet Take 1 tablet (250 mg total) by mouth daily. Take first 2 tablets together, then 1 every day until finished.  6 tablet  0  . benzonatate (TESSALON) 100 MG capsule Take by mouth 3 (three) times daily as needed for cough.      . carvedilol (COREG) 12.5 MG tablet Take 6.25 mg by mouth 2 (two)  times daily with a meal.      . clopidogrel (PLAVIX) 75 MG tablet Take 75 mg by mouth daily with breakfast.      . co-enzyme Q-10 30 MG capsule Take 10 mg by mouth daily.      . DULoxetine (CYMBALTA) 30 MG capsule Take 1 capsule by mouth for 7 days (108m).  Then increase to 2 capsules daily (663m.  60 capsule  0  . fludrocortisone (FLORINEF) 0.1 MG tablet Take 1 tablet (0.1 mg total) by mouth 2 (two) times daily.  60 tablet  2  . gabapentin (NEURONTIN) 300 MG capsule Take 300 mg by mouth as directed. Take 2 caps at Breakfast and 3 caps at Night      . glucagon 1 MG injection Inject 1 mg into the vein once as needed (Emergency Test Kit).      . Marland Kitchenlucose blood test strip 1 each by Other route as directed. Use as instructed FREESTYLE IN VITRO      . insulin lispro (HUMALOG) 100 UNIT/ML injection Inject into the skin as directed. Use in Insulin Pump, Mas daily dose: 100 units/day      . L-Methylfolate-Algae-B12-B6 (METANX) 3-90.314-2-35 MG CAPS Take 1 capsule by mouth 2 (two) times daily.      . midodrine (PROAMATINE) 10 MG tablet Take  10 mg by mouth 2 (two) times daily.      . rosuvastatin (CRESTOR) 10 MG tablet Take 10 mg by mouth daily.      Marland Kitchen zolpidem (AMBIEN CR) 12.5 MG CR tablet Take 12.5 mg by mouth at bedtime.       No current facility-administered medications on file prior to visit.    No Known Allergies  Family History  Problem Relation Age of Onset  . Vascular Disease Father 72    Deceased  . Transient ischemic attack Father   . Hypertension  53    Deceased  . Liver cancer Paternal Grandfather   . Breast cancer Sister   . Heart disease Sister   . Healthy Brother     x1    History   Social History  . Marital Status: Married    Spouse Name: N/A    Number of Children: N/A  . Years of Education: N/A   Social History Main Topics  . Smoking status: Never Smoker   . Smokeless tobacco: None  . Alcohol Use: Yes  . Drug Use: No  . Sexual Activity: None   Other Topics  Concern  . None   Social History Narrative  . None   Review of Systems - See HPI.  All other ROS are negative.  Filed Vitals:   04/12/13 1344  BP: 133/72  Pulse: 74  Temp: 98.2 F (36.8 C)  Resp: 16   Physical Exam  Vitals reviewed. Constitutional: He is oriented to person, place, and time and well-developed, well-nourished, and in no distress.  HENT:  Head: Normocephalic and atraumatic.  Right Ear: External ear normal.  Left Ear: External ear normal.  Nose: Nose normal.  Mouth/Throat: Oropharynx is clear and moist. No oropharyngeal exudate.  Eyes: Conjunctivae and EOM are normal. Pupils are equal, round, and reactive to light.  Neck: Neck supple.  Cardiovascular: Normal rate, regular rhythm, normal heart sounds and intact distal pulses.   Pulmonary/Chest: Effort normal and breath sounds normal. No respiratory distress. He has no wheezes. He has no rales. He exhibits no tenderness.  Lymphadenopathy:    He has no cervical adenopathy.  Neurological: He is alert and oriented to person, place, and time.  Skin: Skin is warm and dry. No rash noted.  Psychiatric: Affect normal.   Recent Results (from the past 2160 hour(s))  CBC WITH DIFFERENTIAL     Status: None   Collection Time    03/29/13  5:23 PM      Result Value Range   WBC 6.0  4.0 - 10.5 K/uL   RBC 4.68  4.22 - 5.81 MIL/uL   Hemoglobin 13.2  13.0 - 17.0 g/dL   HCT 39.3  39.0 - 52.0 %   MCV 84.0  78.0 - 100.0 fL   MCH 28.2  26.0 - 34.0 pg   MCHC 33.6  30.0 - 36.0 g/dL   RDW 15.0  11.5 - 15.5 %   Platelets 226  150 - 400 K/uL   Neutrophils Relative % 63  43 - 77 %   Neutro Abs 3.8  1.7 - 7.7 K/uL   Lymphocytes Relative 27  12 - 46 %   Lymphs Abs 1.6  0.7 - 4.0 K/uL   Monocytes Relative 6  3 - 12 %   Monocytes Absolute 0.4  0.1 - 1.0 K/uL   Eosinophils Relative 3  0 - 5 %   Eosinophils Absolute 0.2  0.0 - 0.7 K/uL   Basophils Relative 1  0 - 1 %   Basophils Absolute 0.0  0.0 - 0.1 K/uL   Smear Review Criteria  for review not met    BASIC METABOLIC PANEL     Status: Abnormal   Collection Time    03/29/13  5:23 PM      Result Value Range   Sodium 139  135 - 145 mEq/L   Potassium 4.6  3.5 - 5.3 mEq/L   Chloride 101  96 - 112 mEq/L   CO2 31  19 - 32 mEq/L   Glucose, Bld 113 (*) 70 - 99 mg/dL   BUN 15  6 - 23 mg/dL   Creat 1.14  0.50 - 1.35 mg/dL   Calcium 8.8  8.4 - 10.5 mg/dL  FERRITIN     Status: None   Collection Time    03/29/13  5:23 PM      Result Value Range   Ferritin 87  22 - 322 ng/mL  CBC WITH DIFFERENTIAL     Status: Abnormal   Collection Time    04/09/13  4:30 PM      Result Value Range   WBC 5.8  4.0 - 10.5 K/uL   RBC 4.09 (*) 4.22 - 5.81 MIL/uL   Hemoglobin 11.8 (*) 13.0 - 17.0 g/dL   HCT 35.3 (*) 39.0 - 52.0 %   MCV 86.3  78.0 - 100.0 fL   MCH 28.9  26.0 - 34.0 pg   MCHC 33.4  30.0 - 36.0 g/dL   RDW 13.4  11.5 - 15.5 %   Platelets 204  150 - 400 K/uL   Neutrophils Relative % 67  43 - 77 %   Neutro Abs 3.9  1.7 - 7.7 K/uL   Lymphocytes Relative 19  12 - 46 %   Lymphs Abs 1.1  0.7 - 4.0 K/uL   Monocytes Relative 10  3 - 12 %   Monocytes Absolute 0.6  0.1 - 1.0 K/uL   Eosinophils Relative 4  0 - 5 %   Eosinophils Absolute 0.2  0.0 - 0.7 K/uL   Basophils Relative 1  0 - 1 %   Basophils Absolute 0.0  0.0 - 0.1 K/uL  BASIC METABOLIC PANEL     Status: Abnormal   Collection Time    04/09/13  4:30 PM      Result Value Range   Sodium 139  137 - 147 mEq/L   Potassium 4.2  3.7 - 5.3 mEq/L   Chloride 99  96 - 112 mEq/L   CO2 26  19 - 32 mEq/L   Glucose, Bld 260 (*) 70 - 99 mg/dL   BUN 15  6 - 23 mg/dL   Creatinine, Ser 0.80  0.50 - 1.35 mg/dL   Calcium 9.0  8.4 - 10.5 mg/dL   GFR calc non Af Amer >90  >90 mL/min   GFR calc Af Amer >90  >90 mL/min   Comment: (NOTE)     The eGFR has been calculated using the CKD EPI equation.     This calculation has not been validated in all clinical situations.     eGFR's persistently <90 mL/min signify possible Chronic Kidney      Disease.  TROPONIN I     Status: None   Collection Time    04/09/13  4:30 PM      Result Value Range   Troponin I <0.30  <0.30 ng/mL   Comment:            Due  to the release kinetics of cTnI,     a negative result within the first hours     of the onset of symptoms does not rule out     myocardial infarction with certainty.     If myocardial infarction is still suspected,     repeat the test at appropriate intervals.  CBC WITH DIFFERENTIAL     Status: Abnormal   Collection Time    04/12/13  2:42 PM      Result Value Range   WBC 8.7  4.0 - 10.5 K/uL   RBC 4.36  4.22 - 5.81 MIL/uL   Hemoglobin 12.3 (*) 13.0 - 17.0 g/dL   HCT 37.3 (*) 39.0 - 52.0 %   MCV 85.6  78.0 - 100.0 fL   MCH 28.2  26.0 - 34.0 pg   MCHC 33.0  30.0 - 36.0 g/dL   RDW 15.1  11.5 - 15.5 %   Platelets 282  150 - 400 K/uL   Neutrophils Relative % 82 (*) 43 - 77 %   Neutro Abs 7.1  1.7 - 7.7 K/uL   Lymphocytes Relative 10 (*) 12 - 46 %   Lymphs Abs 0.9  0.7 - 4.0 K/uL   Monocytes Relative 6  3 - 12 %   Monocytes Absolute 0.6  0.1 - 1.0 K/uL   Eosinophils Relative 2  0 - 5 %   Eosinophils Absolute 0.1  0.0 - 0.7 K/uL   Basophils Relative 0  0 - 1 %   Basophils Absolute 0.0  0.0 - 0.1 K/uL   Smear Review Criteria for review not met    COMPLETE METABOLIC PANEL WITH GFR     Status: Abnormal   Collection Time    04/12/13  2:42 PM      Result Value Range   Sodium 137  135 - 145 mEq/L   Potassium 4.4  3.5 - 5.3 mEq/L   Chloride 101  96 - 112 mEq/L   CO2 29  19 - 32 mEq/L   Glucose, Bld 370 (*) 70 - 99 mg/dL   BUN 15  6 - 23 mg/dL   Creat 1.08  0.50 - 1.35 mg/dL   Total Bilirubin 0.7  0.2 - 1.2 mg/dL   Comment: ** Please note change in reference range(s). **   Alkaline Phosphatase 198 (*) 39 - 117 U/L   AST 69 (*) 0 - 37 U/L   ALT 145 (*) 0 - 53 U/L   Total Protein 6.2  6.0 - 8.3 g/dL   Albumin 3.5  3.5 - 5.2 g/dL   Calcium 8.8  8.4 - 10.5 mg/dL   GFR, Est African American 86     GFR, Est Non African  American 75     Comment:       The estimated GFR is a calculation valid for adults (>=39 years old)     that uses the CKD-EPI algorithm to adjust for age and sex. It is       not to be used for children, pregnant women, hospitalized patients,        patients on dialysis, or with rapidly changing kidney function.     According to the NKDEP, eGFR >89 is normal, 60-89 shows mild     impairment, 30-59 shows moderate impairment, 15-29 shows severe     impairment and <15 is ESRD.         Assessment/Plan: Orthostatic hypotension BP much improved with increase in Midodrine.  CAP (community acquired  pneumonia) Diagnosed via CXR in ER on 1/27. Patient to finish antibiotic.  Increase fluid intake.  Rest.  Saline nasal spray.  Probiotic.  Tessalon for cough.  Humidifier in the bedroom.  Patient is to return if symptoms do not continue to improve.  Diabetes mellitus type 1 Will recheck blood glucose giving recent infection.  Abnormal laboratory test result Will recheck CBC and CMP.  Chronic pain syndrome Will stop Hydrocodone-APAP due to patient's new revelation of hx of elevated liver enzymes.  Will Rx Oxycodone. Will obtain CMP to assess liver function.

## 2013-04-13 DIAGNOSIS — J189 Pneumonia, unspecified organism: Secondary | ICD-10-CM | POA: Insufficient documentation

## 2013-04-13 DIAGNOSIS — R899 Unspecified abnormal finding in specimens from other organs, systems and tissues: Secondary | ICD-10-CM | POA: Insufficient documentation

## 2013-04-13 NOTE — Assessment & Plan Note (Signed)
BP much improved with increase in Midodrine.

## 2013-04-13 NOTE — Assessment & Plan Note (Signed)
Will recheck CBC and CMP

## 2013-04-13 NOTE — Assessment & Plan Note (Signed)
Will stop Hydrocodone-APAP due to patient's new revelation of hx of elevated liver enzymes.  Will Rx Oxycodone. Will obtain CMP to assess liver function.

## 2013-04-13 NOTE — Assessment & Plan Note (Signed)
Diagnosed via CXR in ER on 1/27. Patient to finish antibiotic.  Increase fluid intake.  Rest.  Saline nasal spray.  Probiotic.  Tessalon for cough.  Humidifier in the bedroom.  Patient is to return if symptoms do not continue to improve.

## 2013-04-13 NOTE — Assessment & Plan Note (Signed)
Will recheck blood glucose giving recent infection.

## 2013-04-15 NOTE — Telephone Encounter (Signed)
Patient Unavailable; spouse [Martha] informed, understood & agreed; new lab orders placed/SLS

## 2013-04-15 NOTE — Telephone Encounter (Signed)
Message copied by Regis BillSCATES, Sister Carbone L on Mon Apr 15, 2013  9:14 AM ------      Message from: Marcelline MatesMARTIN, WILLIAM      Created: Sun Apr 14, 2013  9:54 PM       Evidence of elevated liver enzymes.  Will need previous medical records for review.  Patient should continue Oxycodone for pain.  Will have to avoid products with tylenol in them.  No alcohol.  Will need to repeat liver function testing and hepatitis panel in 1-2 weeks now that he is off of tylenol.  Will need imaging if elevation persists. ------

## 2013-04-16 ENCOUNTER — Telehealth: Payer: Self-pay

## 2013-04-16 NOTE — Telephone Encounter (Signed)
Relevant patient education assigned to patient using Emmi. ° °

## 2013-04-19 ENCOUNTER — Ambulatory Visit: Payer: No Typology Code available for payment source | Admitting: Cardiology

## 2013-04-23 ENCOUNTER — Telehealth: Payer: Self-pay | Admitting: Physician Assistant

## 2013-04-23 DIAGNOSIS — J189 Pneumonia, unspecified organism: Secondary | ICD-10-CM

## 2013-04-23 NOTE — Telephone Encounter (Signed)
OK, placed x ray order.  Please verify that he does not have fever, or shortness of breath.  If so, he will also need office visit.

## 2013-04-23 NOTE — Telephone Encounter (Signed)
Pt is coming in tomorrow for lab work. Pt is still experiencing a cough. Wife is requesting this be done tomorrow as well.

## 2013-04-23 NOTE — Telephone Encounter (Signed)
Ordering a chest x-ray. 

## 2013-04-23 NOTE — Telephone Encounter (Signed)
Spoke with pt's wife. She denies fever but states pt is having some shortness of breath on exertion. Advised her that pt would need evaluation in the office as well and we could work pt in around 7:30am. She states it is very difficult to get pt moving early in the morning. Advised her that 9am was the latest we could work him in and she was agreeable to take that time.

## 2013-04-24 ENCOUNTER — Ambulatory Visit: Payer: No Typology Code available for payment source | Admitting: Family

## 2013-04-24 ENCOUNTER — Encounter: Payer: Self-pay | Admitting: Family

## 2013-04-24 ENCOUNTER — Ambulatory Visit (HOSPITAL_BASED_OUTPATIENT_CLINIC_OR_DEPARTMENT_OTHER)
Admission: RE | Admit: 2013-04-24 | Discharge: 2013-04-24 | Disposition: A | Discharge status: Home / Self Care | Payer: No Typology Code available for payment source | Source: Ambulatory Visit | Attending: Family | Admitting: Family

## 2013-04-24 ENCOUNTER — Ambulatory Visit (INDEPENDENT_AMBULATORY_CARE_PROVIDER_SITE_OTHER): Payer: No Typology Code available for payment source | Admitting: Family

## 2013-04-24 VITALS — BP 96/68 | HR 76 | Temp 98.1°F | Resp 14 | Ht 66.0 in | Wt 175.0 lb

## 2013-04-24 DIAGNOSIS — J9819 Other pulmonary collapse: Secondary | ICD-10-CM | POA: Insufficient documentation

## 2013-04-24 DIAGNOSIS — J189 Pneumonia, unspecified organism: Secondary | ICD-10-CM

## 2013-04-24 DIAGNOSIS — G894 Chronic pain syndrome: Secondary | ICD-10-CM

## 2013-04-24 LAB — HEPATIC FUNCTION PANEL
ALT: 32 U/L (ref 0–53)
AST: 26 U/L (ref 0–37)
Albumin: 3.6 g/dL (ref 3.5–5.2)
Alkaline Phosphatase: 103 U/L (ref 39–117)
BILIRUBIN DIRECT: 0.1 mg/dL (ref 0.0–0.3)
Indirect Bilirubin: 0.3 mg/dL (ref 0.2–1.2)
Total Bilirubin: 0.4 mg/dL (ref 0.2–1.2)
Total Protein: 6.2 g/dL (ref 6.0–8.3)

## 2013-04-24 LAB — HEPATITIS PANEL, ACUTE
HCV AB: NEGATIVE
HEP A IGM: NONREACTIVE
Hep B C IgM: NONREACTIVE
Hepatitis B Surface Ag: NEGATIVE

## 2013-04-24 MED ORDER — OXYCODONE HCL 5 MG PO CAPS
5.0000 mg | ORAL_CAPSULE | Freq: Four times a day (QID) | ORAL | Status: DC | PRN
Start: 2013-04-24 — End: 2013-05-02

## 2013-04-24 MED ORDER — SENNA 8.6 MG PO TABS: 1.0000 | ORAL_TABLET | Freq: Every day | ORAL | Status: DC

## 2013-04-24 MED ORDER — MIDODRINE HCL 10 MG PO TABS: 10.0000 mg | ORAL_TABLET | Freq: Two times a day (BID) | ORAL | Status: DC

## 2013-04-24 NOTE — Progress Notes (Signed)
Subjective:    Patient ID: Timothy Alvarez, male    DOB: 11-24-1955, 58 y.o.   MRN: [id number]  HPI  Mr. Mazzie is a 58 yr old male who presents today with chief complaint of cough.  I last saw patient on 04/09/13 when he reported chest discomfort along with cough. Due to his cardiac history, he was referred to the ED for further evaluation.  Tryponin was negative in the ED but CXR revealed bilateral airspace disease. He was treated with rocephin and zithromax in the ED.  Sent home with rx for PO zithromax, and hydrocodone for pain/cough. He was seen back in the office on 04/12/13. At that appointment he noted overall improvement but still had cough.    Wife reports that 2 nights ago he started that "horrible coughing again. Wife started tessalon perles and vaporizor.  Last night after cough medicine wore off he started coughing again.  Feels fatigued since his last MI.  Denies fever. Reports some some shortness of breath, but not as bad as the day we sent him to the ED.  Reports some chest heaviness, but not "heart pain." Pt reports some sob with exertion.  Thinks SOB is slightly worse than his baseline.    Review of Systems See HPI  Past Medical History  Diagnosis Date  . Diabetes type 1, uncontrolled   . Frequent headaches   . Heart disease   . Heart attack 09.13.2014    x5  . Hypertension   . Hyperlipidemia   . Chicken pox   . Mumps   . Measles, [place] (rubella)   . Scarlet fever   . Diabetic retinopathy   . Cataracts, bilateral     History   Social History  . Marital Status: Married    Spouse Name: N/A    Number of Children: N/A  . Years of Education: N/A   Occupational History  . Not on file.   Social History Main Topics  . Smoking status: Never Smoker   . Smokeless tobacco: Not on file  . Alcohol Use: Yes  . Drug Use: No  . Sexual Activity: Not on file   Other Topics Concern  . Not on file   Social History Narrative  . No narrative on file    Past Surgical  History  Procedure Laterality Date  . Coronary angioplasty with stent placement    . Appendectomy  1966  . Wisdom tooth extraction    . Refractive surgery      Retinopathy    Family History  Problem Relation Age of Onset  . Vascular Disease Father 36    Deceased  . Transient ischemic attack Father   . Hypertension  52    Deceased  . Liver cancer Paternal Grandfather   . Breast cancer Sister   . Heart disease Sister   . Healthy Brother     x1    No Known Allergies  Current Outpatient Prescriptions on File Prior to Visit  Medication Sig Dispense Refill  . aspirin 81 MG tablet Take 81 mg by mouth daily.      . benzonatate (TESSALON) 100 MG capsule Take by mouth 3 (three) times daily as needed for cough.      . carvedilol (COREG) 12.5 MG tablet Take 6.25 mg by mouth 2 (two) times daily with a meal.      . clopidogrel (PLAVIX) 75 MG tablet Take 75 mg by mouth daily with breakfast.      . co-enzyme Q-10 30  MG capsule Take 10 mg by mouth daily.      . DULoxetine (CYMBALTA) 30 MG capsule Take 1 capsule by mouth for 7 days (22m).  Then increase to 2 capsules daily (61m.  60 capsule  0  . fludrocortisone (FLORINEF) 0.1 MG tablet Take 1 tablet (0.1 mg total) by mouth 2 (two) times daily.  60 tablet  2  . gabapentin (NEURONTIN) 300 MG capsule Take 300 mg by mouth as directed. Take 2 caps at Breakfast and 3 caps at Night      . glucagon 1 MG injection Inject 1 mg into the vein once as needed (Emergency Test Kit).      . Marland Kitchenlucose blood test strip 1 each by Other route as directed. Use as instructed FREESTYLE IN VITRO      . insulin lispro (HUMALOG) 100 UNIT/ML injection Inject into the skin as directed. Use in Insulin Pump, Mas daily dose: 100 units/day      . L-Methylfolate-Algae-B12-B6 (METANX) 3-90.314-2-35 MG CAPS Take 1 capsule by mouth 2 (two) times daily.      . midodrine (PROAMATINE) 10 MG tablet Take 10 mg by mouth 2 (two) times daily.      . Marland Kitchenxycodone (OXY-IR) 5 MG capsule Take  1 capsule (5 mg total) by mouth every 6 (six) hours as needed for pain.  60 capsule  0  . rosuvastatin (CRESTOR) 10 MG tablet Take 10 mg by mouth daily.      . Marland Kitchenolpidem (AMBIEN CR) 12.5 MG CR tablet Take 12.5 mg by mouth at bedtime.       No current facility-administered medications on file prior to visit.    BP 96/68  Pulse 76  Temp(Src) 98.1 F (36.7 C) (Oral)  Resp 14  Ht 5' 6"  (1.676 m)  Wt 175 lb 0.6 oz (79.398 kg)  BMI 28.27 kg/m2  SpO2 96%       Objective:   Physical Exam  Constitutional: He is oriented to person, place, and time. He appears well-developed and well-nourished. No distress.  Cardiovascular: Normal rate and regular rhythm.   No murmur heard. Pulmonary/Chest: Effort normal and breath sounds normal. No respiratory distress. He has no wheezes. He has no rales. He exhibits no tenderness.  Musculoskeletal: He exhibits no edema.  Neurological: He is alert and oriented to person, place, and time.  Psychiatric: He has a normal mood and affect. His behavior is normal. Judgment and thought content normal.          Assessment & Plan:

## 2013-04-24 NOTE — Patient Instructions (Signed)
Please complete lab work and x ray before leaving. We will contact you with the results of your chest x ray. Call if symptoms worsen or if no improvement in 2-3 days.

## 2013-04-24 NOTE — Progress Notes (Signed)
Pre visit review using our clinic review tool, if applicable. No additional management support is needed unless otherwise documented below in the visit note. 

## 2013-04-24 NOTE — Assessment & Plan Note (Signed)
Obtain follow up chest x-ray due to failure to improve.  If persistent infiltrates, plan additional antibiotic therapy.

## 2013-04-30 ENCOUNTER — Ambulatory Visit: Payer: No Typology Code available for payment source | Admitting: Neurology

## 2013-05-02 ENCOUNTER — Telehealth: Payer: Self-pay | Admitting: Physician Assistant

## 2013-05-02 DIAGNOSIS — G894 Chronic pain syndrome: Secondary | ICD-10-CM

## 2013-05-02 MED ORDER — OXYCODONE HCL 5 MG PO CAPS
5.0000 mg | ORAL_CAPSULE | Freq: Four times a day (QID) | ORAL | Status: DC | PRN
Start: 2013-05-02 — End: 2013-05-16

## 2013-05-02 NOTE — Telephone Encounter (Signed)
Please Advise

## 2013-05-02 NOTE — Telephone Encounter (Signed)
Patient Unavailable; pt's spouse informed, understood & agreed, have Neuro rescheduled for Thursday next week & seeing Endo on Tues of next week/SLS

## 2013-05-02 NOTE — Telephone Encounter (Signed)
Patient was given 60 tablets on 04/24/13.  If taken as directed, this would be a 15 day supply.  Patient therefore should not be out of medication until 05/09/2013.  I will refill the medication and leave it at the front desk.  However, the pharmacy will not be able to fill the prescription until Monday.  He will need to stretch the pills out until medication can be obtained.  We will need to figure out other option for pain control if he is needing more pain medication.

## 2013-05-02 NOTE — Telephone Encounter (Signed)
The neurologist rescheduled his appointment because of the weather to next week.  He will be out of pain meds on Saturday

## 2013-05-03 ENCOUNTER — Encounter: Payer: Self-pay | Admitting: *Deleted

## 2013-05-03 ENCOUNTER — Other Ambulatory Visit: Payer: Self-pay | Admitting: Physician Assistant

## 2013-05-03 NOTE — Telephone Encounter (Signed)
Refill sent per [facility] refill protocol/SLS  

## 2013-05-07 ENCOUNTER — Encounter: Payer: Self-pay | Admitting: Cardiology

## 2013-05-07 ENCOUNTER — Ambulatory Visit (INDEPENDENT_AMBULATORY_CARE_PROVIDER_SITE_OTHER): Payer: No Typology Code available for payment source | Admitting: Neurology

## 2013-05-07 ENCOUNTER — Encounter: Payer: Self-pay | Admitting: Neurology

## 2013-05-07 VITALS — BP 118/70 | HR 76 | Ht 66.0 in | Wt 171.0 lb

## 2013-05-07 DIAGNOSIS — E1142 Type 2 diabetes mellitus with diabetic polyneuropathy: Secondary | ICD-10-CM

## 2013-05-07 DIAGNOSIS — E1140 Type 2 diabetes mellitus with diabetic neuropathy, unspecified: Secondary | ICD-10-CM

## 2013-05-07 DIAGNOSIS — E1149 Type 2 diabetes mellitus with other diabetic neurological complication: Secondary | ICD-10-CM

## 2013-05-07 MED ORDER — GABAPENTIN 600 MG PO TABS: 600.0000 mg | ORAL_TABLET | Freq: Three times a day (TID) | ORAL | Status: DC

## 2013-05-07 NOTE — Patient Instructions (Addendum)
1.  EMG of the right > left arm 2.  Start taking gabapentin as follows:    AM    Afternoon   PM  Week 1: Gabapentin 600mg   600mg    900mg   Week 2:    Gabapentin 900mg   600mg    900mg   Week 3:   Gabapentin 900mg   900mg    900mg  and continue thereafter 3.  If you develop side effects to medication (increased sleepiness, lightheadedness) stay at the lower dose 4.  Return to clinic 6-weeks

## 2013-05-07 NOTE — Progress Notes (Signed)
[facility] Clinic Note - Initial Visit   Date: 05/10/2013    Timothy Alvarez MRN: [id number] DOB: 1955/11/29   Dear Elyn Aquas, PA:  Thank you for your kind referral of Timothy Alvarez for consultation of diabetic neuropathy. Although his history is well known to you, please allow [place] to reiterate it for the purpose of our medical record. The patient was accompanied to the clinic by wife who also provides collateral information.     History of Present Illness: Timothy Alvarez is a 58 y.o. right-handed Caucasian male with history of long history of diabetes type I (HbA1c 6.9, followed at [facility]) with insulin pump c/b diabetic neuropathy and retinopathy, CAD with several MIs s/p stent, hypertension, hyperlipidemia, RLS, and insomnia presenting for evaluation of dysesthesias.  He reports having stabbing pain in the feet and numbness starting in the feet in late 1980s which has slowly progressed into the level of the mid-calf.  Around 2013, he developed pain/tingling/numbness of the hands in additional to bilateral hand weakness.  Pain also involves the shoulders which radiates into the back, which started in September 2014 after his 5th MI.  He had a 10-day ICU stay and had worsening pain since then.  His hospital course was notable for elevated transaminitis.  No pain in the forearms.  Pain is constant and is improved by massage, moist heat, and stretching the hands.  Denies any exacerbating factors. No relief with lidocaine ointment.  He has been taking gabapentin600-900 and was recently started on Cymbalta 47m daily.  He also has been on hydrocodone for the past 4-years for pain, but due to elevated LFTs, he was switched to oxyocodone.  No recent falls.  He uses a cane as needed for balance.   His hand weakness limits his ability to grip and perform fine motor tasks and he has noticed wasting of the hands.  There has no been a significant loss of weight or  severe or sudden onset of severe excruciating pain.  [place] [facility] [facility]  [place].    Out-side paper records, electronic medical record, and images have been reviewed where available and summarized as:  Lab Results  Component Value Date   ALT 32 04/24/2013   AST 26 04/24/2013   ALKPHOS 103 04/24/2013   BILITOT 0.4 04/24/2013   Lab Results  Component Value Date   FERRITIN 87 03/29/2013   Lab Results  Component Value Date   CREATININE 1.08 04/12/2013     Past Medical History  Diagnosis Date  . Diabetes type 1, uncontrolled   . Frequent headaches   . Heart attack 09.13.2014    x5  . Hyperlipidemia   . Chicken pox   . Mumps   . Measles, [place](rubella)   . Scarlet fever   . Diabetic retinopathy   . Cataracts, bilateral   . Orthostatic hypotension   . CAD (coronary artery disease)   . Contrast dye induced nephropathy     Past Surgical History  Procedure Laterality Date  . Coronary angioplasty with stent placement    . Appendectomy  1966  . Wisdom tooth extraction    . Refractive surgery      Retinopathy     Medications:  Current Outpatient Prescriptions on File Prior to Visit  Medication Sig Dispense Refill  . aspirin 81 MG tablet Take  81 mg by mouth daily.      . benzonatate (TESSALON) 100 MG capsule Take by mouth 3 (three) times daily as needed for cough.      . carvedilol (COREG) 12.5 MG tablet Take 6.25 mg by mouth 2 (two) times daily with a meal.      . clopidogrel (PLAVIX) 75 MG tablet Take 75 mg by mouth daily with breakfast.      . co-enzyme Q-10 30 MG capsule Take 10 mg by mouth daily.      . fludrocortisone (FLORINEF) 0.1 MG tablet Take 1 tablet (0.1 mg total) by mouth 2 (two) times daily.  60 tablet  2  . glucagon 1 MG injection Inject 1 mg into the vein once as needed (Emergency Test Kit).      Marland Kitchen glucose blood test strip  1 each by Other route as directed. Use as instructed FREESTYLE IN VITRO      . insulin lispro (HUMALOG) 100 UNIT/ML injection Inject into the skin as directed. Use in Insulin Pump, Mas daily dose: 100 units/day      . L-Methylfolate-Algae-B12-B6 (METANX) 3-90.314-2-35 MG CAPS Take 1 capsule by mouth 2 (two) times daily.      . midodrine (PROAMATINE) 10 MG tablet Take 1 tablet (10 mg total) by mouth 2 (two) times daily.  30 tablet  2  . oxycodone (OXY-IR) 5 MG capsule Take 1 capsule (5 mg total) by mouth every 6 (six) hours as needed for pain.  60 capsule  0  . rosuvastatin (CRESTOR) 10 MG tablet Take 10 mg by mouth daily.      Marland Kitchen senna (SENOKOT) 8.6 MG TABS tablet Take 1 tablet (8.6 mg total) by mouth daily.  30 each  2  . zolpidem (AMBIEN CR) 12.5 MG CR tablet Take 12.5 mg by mouth at bedtime.       No current facility-administered medications on file prior to visit.    Allergies: No Known Allergies  Family History: Family History  Problem Relation Age of Onset  . Vascular Disease Father 24    Deceased  . Transient ischemic attack Father   . Hypertension Mother 36    Deceased  . Liver cancer Paternal Grandfather   . Breast cancer Sister   . Heart disease Sister     Ablation for rhythm disturbance  . Healthy Brother     x1  . Other Mother     s/p PPM    Social History: History   Social History  . Marital Status: Married    Spouse Name: N/A    Number of Children: 1  . Years of Education: N/A   Occupational History  . Not on file.   Social History Main Topics  . Smoking status: Never Smoker   . Smokeless tobacco: Not on file  . Alcohol Use: Yes     Comment: Rare  . Drug Use: No  . Sexual Activity: Not on file   Other Topics Concern  . 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  They have one grown daughter.   Highest level of education:  Master's Degree    Review of Systems:  CONSTITUTIONAL: No  fevers, chills, night sweats, or weight loss.   EYES: +visual changes or eye pain ENT: No hearing changes.  No history of nose bleeds.   RESPIRATORY: No cough, wheezing and shortness of breath.   CARDIOVASCULAR: Negative for chest  pain, and palpitations.   GI: Negative for abdominal discomfort, blood in stools or black stools.  No recent change in bowel habits.   GU:  No history of incontinence.   MUSCLOSKELETAL: No history of joint pain or swelling.  No myalgias.   SKIN: Negative for lesions, rash, and itching.   HEMATOLOGY/ONCOLOGY: Negative for prolonged bleeding, bruising easily, and swollen nodes.   ENDOCRINE: +cold or heat intolerance, no polydipsia or goiter.   PSYCH:  + depression or anxiety symptoms.   NEURO: As Above.   Vital Signs:  BP 118/70  Pulse 76  Ht $R'5\' 6"'GC$  (1.676 m)  Wt 171 lb (77.565 kg)  BMI 27.61 kg/m2   Neurological Exam: MENTAL STATUS including orientation to time, place, person, recent and remote memory, attention span and concentration, language, and fund of knowledge is normal.  Speech is not dysarthric.  CRANIAL NERVES: II:  No visual field defects.  Retinopathy noted bilaterally.     III-IV-VI: Pupils equal round and reactive to light.  Normal conjugate, extra-ocular eye movements in all directions of gaze.  No nystagmus.  No ptosis.   V:  Normal facial sensation.    VII:  Normal facial symmetry and movements.    VIII:  Normal hearing and vestibular function.   IX-X:  Normal palatal movement.   XI:  Normal shoulder shrug and head rotation.   XII:  Normal tongue strength and range of motion, no deviation or fasciculation.  MOTOR:  Marked atrophy of instrinsic hand muscles (ADM, APB, FDI, distal forearm muscles, mild atrophy of TA and Med gastroc).  No fasciculations or abnormal movements.   Tone is normal.    Right Upper Extremity:    Left Upper Extremity:    Deltoid  5/5   Deltoid  5/5   Biceps  5/5   Biceps  5/5   Triceps  5/5   Triceps  5/5     Wrist extensors  5/5   Wrist extensors  5/5   Wrist flexors  5/5   Wrist flexors  5/5   Finger extensors  4/5   Finger extensors  4/5   Finger flexors  5-/5   Finger flexors  5-/5   Dorsal interossei  4-/5   Dorsal interossei  4-/5   Abductor pollicis  3/5   Abductor pollicis  3/5   Tone (Ashworth scale)  0  Tone (Ashworth scale)  0   Right Lower Extremity:    Left Lower Extremity:    Hip flexors  5/5   Hip flexors  5/5   Hip extensors  5/5   Hip extensors  5/5   Knee flexors  5/5   Knee flexors  5/5   Knee extensors  5/5   Knee extensors  5/5   Dorsiflexors  4/5   Dorsiflexors  4/5   Plantarflexors  4+/5   Plantarflexors  4+/5   Toe extensors  3+/5   Toe extensors  3+/5   Toe flexors  4/5   Toe flexors  4/5   Tone (Ashworth scale)  0  Tone (Ashworth scale)  0   MSRs:  Right                                                                 Left brachioradialis  2+  brachioradialis 2+  biceps 2+  biceps 2+  triceps 0  triceps 0  patellar 0  Patellar 0  ankle jerk 0  ankle jerk 0  Hoffman no  Hoffman no  plantar response mute  plantar response mute   SENSORY:  Vibration is 100% at elbows, 40% at MCP, trace at knees and absent distal to ankles bilaterally. Pin prick is reduced distal to the mid-thigh bilaterally, over entire left arm, and distal to right elbow in a circumfrential pattern.  Proprioception impaired in the legs.  Proprioception intact at the DIP.  Temperature intact only in proximal arms. Romberg's sign present.   COORDINATION/GAIT: Normal finger-to- nose-finger and heel-to-shin. Imprecision with finger tapping bilaterally due to weakness.  Able to rise from a chair without using arms.  Slight dragging of the L > R foot.  He is unable to perform stressed gait and very unsteady with tandem gait.   IMPRESSION: 1.  Severe painful diabetic sensorimotor neuropathy  Marked distal and symmetric weakness of hands and feet which is most likely due to his neuropathy, however I am  surprised at the extent of atrophy involving the hands than compared to the legs.  I would to make sure there is no a superimposed C8/T1 radiculopathy so will obtain EMG of the upper extremities  Discussed symptomatic management with nerve membrane stabilizing medications (Lyrica, would avoid TCAs given cardiac history, carbamazepine, alpha lipoic acid), but I would like to optimize neurontin before adding other agents.  His most recent LFTs and renal function was normal, so I feel going up on neurontin is reasonable.  Since he is already on oxycodone and continues to have pain, I have also recommended he sees pain management. 2.  Diabetes type I on insulin pump 3.  Diabetic retinopathy, followed by Dr. Antionette Fairy 4.  History of CAD with 5 MIs in the past, most recently had cardiac stent placed in 2014 5.  RLS 6.  HTN 7.  HPL   PLAN/RECOMMENDATIONS:  1.  EMG of the right > left arm 2.  Start taking gabapentin as follows:    AM    Afternoon   PM  Week 1: Gabapentin 660m  6072m  90029mWeek 2:    Gabapentin 900m74m00mg61m00mg 65mk 3:   Gabapentin 900mg  25mg   20mg and40mtinue thereafter 3.  Return to clinic 6-weeks    The duration of this appointment visit was 50 minutes of face-to-face time with the patient.  Greater than 50% of this time was spent in counseling, explanation of diagnosis, planning of further management, and coordination of care.   Thank you for allowing me to participate in patient's care.  If I can answer any additional questions, I would be pleased to do so.    Sincerely,    Donika K. Patel, DOPosey Pronto

## 2013-05-08 ENCOUNTER — Telehealth: Payer: Self-pay | Admitting: *Deleted

## 2013-05-08 ENCOUNTER — Ambulatory Visit (HOSPITAL_BASED_OUTPATIENT_CLINIC_OR_DEPARTMENT_OTHER)
Admission: RE | Admit: 2013-05-08 | Discharge: 2013-05-08 | Disposition: A | Discharge status: Home / Self Care | Payer: No Typology Code available for payment source | Source: Ambulatory Visit | Attending: Cardiology | Admitting: Cardiology

## 2013-05-08 ENCOUNTER — Ambulatory Visit (INDEPENDENT_AMBULATORY_CARE_PROVIDER_SITE_OTHER): Payer: No Typology Code available for payment source | Admitting: Cardiology

## 2013-05-08 ENCOUNTER — Encounter: Payer: Self-pay | Admitting: Cardiology

## 2013-05-08 VITALS — BP 118/70 | HR 74 | Ht 66.0 in | Wt 171.0 lb

## 2013-05-08 DIAGNOSIS — N1411 Contrast-induced nephropathy: Secondary | ICD-10-CM

## 2013-05-08 DIAGNOSIS — N170 Acute kidney failure with tubular necrosis: Secondary | ICD-10-CM

## 2013-05-08 DIAGNOSIS — I2510 Atherosclerotic heart disease of native coronary artery without angina pectoris: Secondary | ICD-10-CM

## 2013-05-08 DIAGNOSIS — N141 Nephropathy induced by other drugs, medicaments and biological substances: Secondary | ICD-10-CM

## 2013-05-08 DIAGNOSIS — E785 Hyperlipidemia, unspecified: Secondary | ICD-10-CM

## 2013-05-08 DIAGNOSIS — R0989 Other specified symptoms and signs involving the circulatory and respiratory systems: Secondary | ICD-10-CM

## 2013-05-08 DIAGNOSIS — T508X5A Adverse effect of diagnostic agents, initial encounter: Secondary | ICD-10-CM

## 2013-05-08 DIAGNOSIS — I6529 Occlusion and stenosis of unspecified carotid artery: Secondary | ICD-10-CM | POA: Insufficient documentation

## 2013-05-08 DIAGNOSIS — I951 Orthostatic hypotension: Secondary | ICD-10-CM

## 2013-05-08 LAB — LIPID PANEL
Cholesterol: 139 mg/dL (ref 0–200)
HDL: 57 mg/dL (ref >39)
LDL Cholesterol: 66 mg/dL (ref 0–99)
TRIGLYCERIDES: 79 mg/dL (ref <150)
Total CHOL/HDL Ratio: 2.4 Ratio
VLDL: 16 mg/dL (ref 0–40)

## 2013-05-08 NOTE — Assessment & Plan Note (Addendum)
Continue Florinef and midodrine. I will not add an ACE inhibitor given orthostasis.

## 2013-05-08 NOTE — Assessment & Plan Note (Signed)
Patient had contrast nephropathy at time of previous PCI. [place] [facility]. His most recent laboratories show that his renal function has improved. This will need to be considered if followup cardiac catheterization is necessary.

## 2013-05-08 NOTE — Assessment & Plan Note (Signed)
Check carotid Dopplers °

## 2013-05-08 NOTE — Assessment & Plan Note (Signed)
Continue aspirin Plavix and statin.

## 2013-05-08 NOTE — Telephone Encounter (Signed)
Pt's spouse called to inform that they had appt w/Dr. Allena KatzPatel and they appreciate the referral; pt's Gabapentin was increased to 900 mg daily; they also have seen Cardio & Endo. Endo believe the stress related pain is having affect on his CBGs and are having him up his baseline on Insulin pump & check blood sugars every 2-3 hours. Caller would like to know if it would be possible to have a pain patch prescribed for consistency on pain relief, as Oxycodone is not giving relief [not as good as Hydrocodone was]/SLS Please Advise.

## 2013-05-08 NOTE — Patient Instructions (Addendum)
Your physician wants you to follow-up in: 6 MONTHS WITH DR Jens SomRENSHAW [place] You will receive a reminder letter in the mail two months in advance. If you don't receive a letter, please call our office to schedule the follow-up appointment.   Your physician has requested that you have a carotid duplex. This test is an ultrasound of the carotid arteries in your neck. It looks at blood flow through these arteries that supply the brain with blood. Allow one hour for this exam. There are no restrictions or special instructions.   Your physician recommends that you HAVE LAB WORK TODAY

## 2013-05-08 NOTE — Progress Notes (Signed)
    HPI: 57 yo male for evaluation of CAD. Patient previously lived in [place]. He had his first myocardial infarction in 2010. He has had subsequent events. He moved here in May of 2014. This past fall he was admitted to [facility] with a myocardial infarction. He had PCI. Procedure was complicated by contrast nephropathy requiring transient dialysis. I do not have those records available. Since then he does have some fatigue. He has dyspnea with more extreme activities. No orthopnea, PND, pedal edema, claudication or chest pain. Note his symptoms at the time of his previous infarct were severe nausea and diaphoresis.   Current Outpatient Prescriptions  Medication Sig Dispense Refill  . aspirin 81 MG tablet Take 81 mg by mouth daily.      . benzonatate (TESSALON) 100 MG capsule Take by mouth 3 (three) times daily as needed for cough.      . carvedilol (COREG) 12.5 MG tablet Take 6.25 mg by mouth 2 (two) times daily with a meal.      . clopidogrel (PLAVIX) 75 MG tablet Take 75 mg by mouth daily with breakfast.      . co-enzyme Q-10 30 MG capsule Take 10 mg by mouth daily.      . DULoxetine (CYMBALTA) 30 MG capsule Take 2 capsules (60 mg total) by mouth daily.  60 capsule  1  . fludrocortisone (FLORINEF) 0.1 MG tablet Take 1 tablet (0.1 mg total) by mouth 2 (two) times daily.  60 tablet  2  . gabapentin (NEURONTIN) 600 MG tablet Take 1 tablet (600 mg total) by mouth 3 (three) times daily.  90 tablet  6  . glucagon 1 MG injection Inject 1 mg into the vein once as needed (Emergency Test Kit).      . glucose blood test strip 1 each by Other route as directed. Use as instructed FREESTYLE IN VITRO      . insulin lispro (HUMALOG) 100 UNIT/ML injection Inject into the skin as directed. Use in Insulin Pump, Mas daily dose: 100 units/day      . L-Methylfolate-Algae-B12-B6 (METANX) 3-90.314-2-35 MG CAPS Take 1 capsule by mouth 2 (two) times daily.      . midodrine (PROAMATINE) 10 MG tablet Take  1 tablet (10 mg total) by mouth 2 (two) times daily.  30 tablet  2  . oxycodone (OXY-IR) 5 MG capsule Take 1 capsule (5 mg total) by mouth every 6 (six) hours as needed for pain.  60 capsule  0  . rosuvastatin (CRESTOR) 10 MG tablet Take 10 mg by mouth daily.      . senna (SENOKOT) 8.6 MG TABS tablet Take 1 tablet (8.6 mg total) by mouth daily.  30 each  2  . zolpidem (AMBIEN CR) 12.5 MG CR tablet Take 12.5 mg by mouth at bedtime.       No current facility-administered medications for this visit.     Past Medical History  Diagnosis Date  . Diabetes type 1, uncontrolled   . Frequent headaches   . Heart attack 09.13.2014    x5  . Hyperlipidemia   . Chicken pox   . Mumps   . Measles, [place] (rubella)   . Scarlet fever   . Diabetic retinopathy   . Cataracts, bilateral   . Orthostatic hypotension   . CAD (coronary artery disease)   . Contrast dye induced nephropathy     Past Surgical History  Procedure Laterality Date  . Coronary angioplasty with stent placement    .   Appendectomy  1966  . Wisdom tooth extraction    . Refractive surgery      Retinopathy    History   Social History  . Marital Status: Married    Spouse Name: N/A    Number of Children: 1  . Years of Education: N/A   Occupational History  . Not on file.   Social History Main Topics  . Smoking status: Never Smoker   . Smokeless tobacco: Not on file  . Alcohol Use: Yes     Comment: Rare  . Drug Use: No  . Sexual Activity: Not on file   Other Topics Concern  . 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  They have one grown daughter.   Highest level of education:  Master's Degree    ROS: fatigue but no fevers or chills, productive cough, hemoptysis, dysphasia, odynophagia, melena, hematochezia, dysuria, hematuria, rash, seizure activity, orthopnea, PND, pedal edema, claudication. Remaining systems are negative.  Physical  Exam: Well-developed well-nourished in no acute distress.  Skin is warm and dry.  Back normal HEENT is normal. Normal eyelids Neck is supple. Positive carotid bruit. No jugular venous distention. No thyromegaly. Chest is clear to auscultation with normal expansion.  Cardiovascular exam is regular rate and rhythm. No murmurs rubs or gallops noted. Abdominal exam nontender or distended. No masses palpated. 2+ femoral pulses. No bruits noted. Extremities show no edema. 2+ dorsalis pedis pulses. 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

## 2013-05-08 NOTE — Assessment & Plan Note (Signed)
Check lipids. Increase Crestor as needed.

## 2013-05-10 ENCOUNTER — Other Ambulatory Visit: Payer: Self-pay | Admitting: Physician Assistant

## 2013-05-10 DIAGNOSIS — M792 Neuralgia and neuritis, unspecified: Secondary | ICD-10-CM

## 2013-05-10 MED ORDER — DULOXETINE HCL 60 MG PO CPEP: 60.0000 mg | ORAL_CAPSULE | Freq: Every day | ORAL | Status: DC

## 2013-05-10 NOTE — Progress Notes (Signed)
Note faxed.

## 2013-05-10 NOTE — Telephone Encounter (Signed)
Patient Unavailable; spouse informed, understood & agreed; appt scheduled Thurs, 03.05.15 at 1:30p/SLS

## 2013-05-10 NOTE — Telephone Encounter (Signed)
Patient was denied by pain clinic, stating he was not appropriate for their clinic.  I think a patch would be a great option for him in addition to his Gabapentin and Cymbalta.  I would like to start him on Butrans patches once his current narcotic medication prescription runs out.  Would like to see patient before addition of new medication so we can talk about accurate patch placement and correct dosing of medication.  They should just try to schedule appointment before his medication is about to run out.

## 2013-05-14 ENCOUNTER — Encounter: Payer: Self-pay | Admitting: *Deleted

## 2013-05-14 ENCOUNTER — Telehealth: Payer: Self-pay | Admitting: Cardiology

## 2013-05-14 NOTE — Telephone Encounter (Signed)
[facility]Dr.Timothy Gardner @ [phone]  Call back [phone]

## 2013-05-14 NOTE — Telephone Encounter (Signed)
[facility] to [facility] [phone]

## 2013-05-16 ENCOUNTER — Other Ambulatory Visit: Payer: Self-pay | Admitting: *Deleted

## 2013-05-16 ENCOUNTER — Encounter: Payer: Self-pay | Admitting: Physician Assistant

## 2013-05-16 ENCOUNTER — Telehealth: Payer: Self-pay | Admitting: Physician Assistant

## 2013-05-16 ENCOUNTER — Telehealth: Payer: Self-pay | Admitting: Neurology

## 2013-05-16 ENCOUNTER — Ambulatory Visit (INDEPENDENT_AMBULATORY_CARE_PROVIDER_SITE_OTHER): Payer: No Typology Code available for payment source | Admitting: Physician Assistant

## 2013-05-16 VITALS — BP 132/82 | HR 82 | Temp 98.1°F | Resp 16 | Ht 66.0 in | Wt 165.2 lb

## 2013-05-16 DIAGNOSIS — G894 Chronic pain syndrome: Secondary | ICD-10-CM

## 2013-05-16 DIAGNOSIS — H3581 Retinal edema: Secondary | ICD-10-CM

## 2013-05-16 MED ORDER — BUPRENORPHINE 5 MCG/HR TD PTWK: 5.0000 ug | MEDICATED_PATCH | TRANSDERMAL | Status: DC

## 2013-05-16 MED ORDER — BUPRENORPHINE 10 MCG/HR TD PTWK: 10.0000 ug | MEDICATED_PATCH | TRANSDERMAL | Status: DC

## 2013-05-16 NOTE — Telephone Encounter (Signed)
Verified with pharmacy that patient had p/u medication with new dosing & therapy instructions [changed to 60 mg once daily]; pt p/u Rx on 02.27.15 at [facility] pharmacy, faxed this information to PA requesting pharmacy [[facility]]/SLS

## 2013-05-16 NOTE — Telephone Encounter (Signed)
Returned call to Dr. Arnetha Gulaadionchenko.  She says that patient has severe macular edema and literature suggests that OSA can be a contributing factor so was requesting sleep study, which my office can coordinate.  Donika K. Allena KatzPatel, DO

## 2013-05-16 NOTE — Progress Notes (Signed)
Pre visit review using our clinic review tool, if applicable. No additional management support is needed unless otherwise documented below in the visit note/SLS  

## 2013-05-16 NOTE — Telephone Encounter (Signed)
Dr. Milinda CaveYulia Radionchenko called regarding this patient. She would like to speak with Dr. Allena KatzPatel. CB# [phone] / Sherri S.

## 2013-05-16 NOTE — Telephone Encounter (Signed)
Received PA form for Duloxetine DR 30mg , form forward to nurse

## 2013-05-16 NOTE — Patient Instructions (Addendum)
Please continue medications as prescribed.  Follow-up with Neurology as scheduled.  I am discontinuing your Oxy-IR as we are starting a new medication.   Please apply the 5mg  Butrans patch to either shoulder.  You need to read the instructions on the packaging for correct use.  After 1 week, you will remove the patch and then apply the next patch (10mg ) on the other arm.  You will change patch weekly. I need to see you within a month to assess your symptoms.  Also try adding a topical Salon Pas spray to your hands for the hand pain.

## 2013-05-16 NOTE — Telephone Encounter (Signed)
Called to set up sleep study.  LM [facility] to call me back.

## 2013-05-16 NOTE — Telephone Encounter (Signed)
PSG is set up for March 31 at 8 pm.  [facility].  They will be sending him a packet in the mail with instructions.  Phone: [phone]  LM with pt's wife for him to call me back.

## 2013-05-16 NOTE — Progress Notes (Signed)
Patient presents to clinic today to discuss new medication.  Patient with chronic pain syndrome and diabetic neuropathy, mostly affecting hands bilaterally.  Has taken multiple oral medications for pain in the past.  Patient also on multiple medications for chronic health problems.  Would like to begin a transdermal medication for better pain control.  Patient has not tried a transdermal medication before.  Has tried other topical preparations with minimal relief of symptoms.  Past Medical History  Diagnosis Date  . Diabetes type 1, uncontrolled   . Frequent headaches   . Heart attack 09.13.2014    x5  . Hyperlipidemia   . Chicken pox   . Mumps   . Measles, [place] (rubella)   . Scarlet fever   . Diabetic retinopathy   . Cataracts, bilateral   . Orthostatic hypotension   . CAD (coronary artery disease)   . Contrast dye induced nephropathy     Current Outpatient Prescriptions on File Prior to Visit  Medication Sig Dispense Refill  . aspirin 81 MG tablet Take 81 mg by mouth daily.      . carvedilol (COREG) 12.5 MG tablet Take 6.25 mg by mouth 2 (two) times daily with a meal.      . clopidogrel (PLAVIX) 75 MG tablet Take 75 mg by mouth daily with breakfast.      . co-enzyme Q-10 30 MG capsule Take 10 mg by mouth daily.      . DULoxetine (CYMBALTA) 60 MG capsule Take 1 capsule (60 mg total) by mouth daily.  30 capsule  2  . fludrocortisone (FLORINEF) 0.1 MG tablet Take 1 tablet (0.1 mg total) by mouth 2 (two) times daily.  60 tablet  2  . gabapentin (NEURONTIN) 600 MG tablet Take 1 tablet (600 mg total) by mouth 3 (three) times daily.  90 tablet  6  . glucagon 1 MG injection Inject 1 mg into the vein once as needed (Emergency Test Kit).      Marland Kitchen glucose blood test strip 1 each by Other route as directed. Use as instructed FREESTYLE IN VITRO      . insulin lispro (HUMALOG) 100 UNIT/ML injection Inject into the skin as directed. Use in Insulin Pump, Mas daily dose: 100 units/day      .  L-Methylfolate-Algae-B12-B6 (METANX) 3-90.314-2-35 MG CAPS Take 1 capsule by mouth 2 (two) times daily.      . midodrine (PROAMATINE) 10 MG tablet Take 1 tablet (10 mg total) by mouth 2 (two) times daily.  30 tablet  2  . rosuvastatin (CRESTOR) 10 MG tablet Take 10 mg by mouth daily.      Marland Kitchen senna (SENOKOT) 8.6 MG TABS tablet Take 1 tablet (8.6 mg total) by mouth daily.  30 each  2  . zolpidem (AMBIEN CR) 12.5 MG CR tablet Take 12.5 mg by mouth at bedtime.       No current facility-administered medications on file prior to visit.    No Known Allergies  Family History  Problem Relation Age of Onset  . Vascular Disease Father 75    Deceased  . Transient ischemic attack Father   . Hypertension Mother 66    Deceased  . Liver cancer Paternal Grandfather   . Breast cancer Sister   . Heart disease Sister     Ablation for rhythm disturbance  . Healthy Brother     x1  . Other Mother     s/p PPM    History   Social History  . Marital  Status: Married    Spouse Name: N/A    Number of Children: 1  . Years of Education: N/A   Social History Main Topics  . Smoking status: Never Smoker   . Smokeless tobacco: None  . Alcohol Use: Yes     Comment: Rare  . Drug Use: No  . Sexual Activity: None   Other Topics Concern  . 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  They have one grown daughter.   Highest level of education:  Master's Degree    Review of Systems - See HPI.  All other ROS are negative.  BP 132/82  Pulse 82  Temp(Src) 98.1 F (36.7 C) (Oral)  Resp 16  Ht 5' 6"  (1.676 m)  Wt 165 lb 4 oz (74.957 kg)  BMI 26.68 kg/m2  SpO2 98%  Physical Exam  Vitals reviewed. Constitutional: He is oriented to person, place, and time and well-developed, well-nourished, and in no distress.  HENT:  Head: Normocephalic and atraumatic.  Eyes: Conjunctivae are normal. Pupils are equal, round, and reactive to light.  Neck:  Neck supple.  Cardiovascular: Normal rate, regular rhythm and normal heart sounds.   Pulmonary/Chest: Effort normal.  Neurological: He is alert and oriented to person, place, and time.  Skin: Skin is warm and dry. No rash noted.  Psychiatric: Affect normal.    Recent Results (from the past 2160 hour(s))  CBC WITH DIFFERENTIAL     Status: None   Collection Time    03/29/13  5:23 PM      Result Value Ref Range   WBC 6.0  4.0 - 10.5 K/uL   RBC 4.68  4.22 - 5.81 MIL/uL   Hemoglobin 13.2  13.0 - 17.0 g/dL   HCT 39.3  39.0 - 52.0 %   MCV 84.0  78.0 - 100.0 fL   MCH 28.2  26.0 - 34.0 pg   MCHC 33.6  30.0 - 36.0 g/dL   RDW 15.0  11.5 - 15.5 %   Platelets 226  150 - 400 K/uL   Neutrophils Relative % 63  43 - 77 %   Neutro Abs 3.8  1.7 - 7.7 K/uL   Lymphocytes Relative 27  12 - 46 %   Lymphs Abs 1.6  0.7 - 4.0 K/uL   Monocytes Relative 6  3 - 12 %   Monocytes Absolute 0.4  0.1 - 1.0 K/uL   Eosinophils Relative 3  0 - 5 %   Eosinophils Absolute 0.2  0.0 - 0.7 K/uL   Basophils Relative 1  0 - 1 %   Basophils Absolute 0.0  0.0 - 0.1 K/uL   Smear Review Criteria for review not met    BASIC METABOLIC PANEL     Status: Abnormal   Collection Time    03/29/13  5:23 PM      Result Value Ref Range   Sodium 139  135 - 145 mEq/L   Potassium 4.6  3.5 - 5.3 mEq/L   Chloride 101  96 - 112 mEq/L   CO2 31  19 - 32 mEq/L   Glucose, Bld 113 (*) 70 - 99 mg/dL   BUN 15  6 - 23 mg/dL   Creat 1.14  0.50 - 1.35 mg/dL   Calcium 8.8  8.4 - 10.5 mg/dL  FERRITIN     Status: None   Collection Time    03/29/13  5:23 PM  Result Value Ref Range   Ferritin 87  22 - 322 ng/mL  CBC WITH DIFFERENTIAL     Status: Abnormal   Collection Time    04/09/13  4:30 PM      Result Value Ref Range   WBC 5.8  4.0 - 10.5 K/uL   RBC 4.09 (*) 4.22 - 5.81 MIL/uL   Hemoglobin 11.8 (*) 13.0 - 17.0 g/dL   HCT 35.3 (*) 39.0 - 52.0 %   MCV 86.3  78.0 - 100.0 fL   MCH 28.9  26.0 - 34.0 pg   MCHC 33.4  30.0 - 36.0 g/dL    RDW 13.4  11.5 - 15.5 %   Platelets 204  150 - 400 K/uL   Neutrophils Relative % 67  43 - 77 %   Neutro Abs 3.9  1.7 - 7.7 K/uL   Lymphocytes Relative 19  12 - 46 %   Lymphs Abs 1.1  0.7 - 4.0 K/uL   Monocytes Relative 10  3 - 12 %   Monocytes Absolute 0.6  0.1 - 1.0 K/uL   Eosinophils Relative 4  0 - 5 %   Eosinophils Absolute 0.2  0.0 - 0.7 K/uL   Basophils Relative 1  0 - 1 %   Basophils Absolute 0.0  0.0 - 0.1 K/uL  BASIC METABOLIC PANEL     Status: Abnormal   Collection Time    04/09/13  4:30 PM      Result Value Ref Range   Sodium 139  137 - 147 mEq/L   Potassium 4.2  3.7 - 5.3 mEq/L   Chloride 99  96 - 112 mEq/L   CO2 26  19 - 32 mEq/L   Glucose, Bld 260 (*) 70 - 99 mg/dL   BUN 15  6 - 23 mg/dL   Creatinine, Ser 0.80  0.50 - 1.35 mg/dL   Calcium 9.0  8.4 - 10.5 mg/dL   GFR calc non Af Amer >90  >90 mL/min   GFR calc Af Amer >90  >90 mL/min   Comment: (NOTE)     The eGFR has been calculated using the CKD EPI equation.     This calculation has not been validated in all clinical situations.     eGFR's persistently <90 mL/min signify possible Chronic Kidney     Disease.  TROPONIN I     Status: None   Collection Time    04/09/13  4:30 PM      Result Value Ref Range   Troponin I <0.30  <0.30 ng/mL   Comment:            Due to the release kinetics of cTnI,     a negative result within the first hours     of the onset of symptoms does not rule out     myocardial infarction with certainty.     If myocardial infarction is still suspected,     repeat the test at appropriate intervals.  CBC WITH DIFFERENTIAL     Status: Abnormal   Collection Time    04/12/13  2:42 PM      Result Value Ref Range   WBC 8.7  4.0 - 10.5 K/uL   RBC 4.36  4.22 - 5.81 MIL/uL   Hemoglobin 12.3 (*) 13.0 - 17.0 g/dL   HCT 37.3 (*) 39.0 - 52.0 %   MCV 85.6  78.0 - 100.0 fL   MCH 28.2  26.0 - 34.0 pg   MCHC 33.0  30.0 -  36.0 g/dL   RDW 15.1  11.5 - 15.5 %   Platelets 282  150 - 400 K/uL    Neutrophils Relative % 82 (*) 43 - 77 %   Neutro Abs 7.1  1.7 - 7.7 K/uL   Lymphocytes Relative 10 (*) 12 - 46 %   Lymphs Abs 0.9  0.7 - 4.0 K/uL   Monocytes Relative 6  3 - 12 %   Monocytes Absolute 0.6  0.1 - 1.0 K/uL   Eosinophils Relative 2  0 - 5 %   Eosinophils Absolute 0.1  0.0 - 0.7 K/uL   Basophils Relative 0  0 - 1 %   Basophils Absolute 0.0  0.0 - 0.1 K/uL   Smear Review Criteria for review not met    COMPLETE METABOLIC PANEL WITH GFR     Status: Abnormal   Collection Time    04/12/13  2:42 PM      Result Value Ref Range   Sodium 137  135 - 145 mEq/L   Potassium 4.4  3.5 - 5.3 mEq/L   Chloride 101  96 - 112 mEq/L   CO2 29  19 - 32 mEq/L   Glucose, Bld 370 (*) 70 - 99 mg/dL   BUN 15  6 - 23 mg/dL   Creat 1.08  0.50 - 1.35 mg/dL   Total Bilirubin 0.7  0.2 - 1.2 mg/dL   Comment: ** Please note change in reference range(s). **   Alkaline Phosphatase 198 (*) 39 - 117 U/L   AST 69 (*) 0 - 37 U/L   ALT 145 (*) 0 - 53 U/L   Total Protein 6.2  6.0 - 8.3 g/dL   Albumin 3.5  3.5 - 5.2 g/dL   Calcium 8.8  8.4 - 10.5 mg/dL   GFR, Est African American 86     GFR, Est Non African American 75     Comment:       The estimated GFR is a calculation valid for adults (>=68 years old)     that uses the CKD-EPI algorithm to adjust for age and sex. It is       not to be used for children, pregnant women, hospitalized patients,        patients on dialysis, or with rapidly changing kidney function.     According to the NKDEP, eGFR >89 is normal, 60-89 shows mild     impairment, 30-59 shows moderate impairment, 15-29 shows severe     impairment and <15 is ESRD.        HEPATIC FUNCTION PANEL     Status: None   Collection Time    04/24/13  9:58 AM      Result Value Ref Range   Total Bilirubin 0.4  0.2 - 1.2 mg/dL   Comment: ** Please note change in reference range(s). **   Bilirubin, Direct 0.1  0.0 - 0.3 mg/dL   Indirect Bilirubin 0.3  0.2 - 1.2 mg/dL   Comment: ** Please note change  in reference range(s). **   Alkaline Phosphatase 103  39 - 117 U/L   AST 26  0 - 37 U/L   ALT 32  0 - 53 U/L   Total Protein 6.2  6.0 - 8.3 g/dL   Albumin 3.6  3.5 - 5.2 g/dL  HEPATITIS PANEL, ACUTE     Status: None   Collection Time    04/24/13  9:58 AM      Result Value Ref Range   Hepatitis  B Surface Ag NEGATIVE  NEGATIVE   HCV Ab NEGATIVE  NEGATIVE   Hep B C IgM NON REACTIVE  NON REACTIVE   Comment: High levels of Hepatitis B Core IgM antibody are detectable     during the acute stage of Hepatitis B. This antibody is used     to differentiate current from past HBV infection.         Hep A IgM NON REACTIVE  NON REACTIVE  LIPID PANEL     Status: None   Collection Time    05/08/13 11:43 AM      Result Value Ref Range   Cholesterol 139  0 - 200 mg/dL   Comment: ATP III Classification:           < 200        mg/dL        Desirable          200 - 239     mg/dL        Borderline High          >= 240        mg/dL        High         Triglycerides 79  <150 mg/dL   HDL 57  >39 mg/dL   Total CHOL/HDL Ratio 2.4     VLDL 16  0 - 40 mg/dL   LDL Cholesterol 66  0 - 99 mg/dL   Comment:       Total Cholesterol/HDL Ratio:CHD Risk                            Coronary Heart Disease Risk Table                                            Men       Women              1/2 Average Risk              3.4        3.3                  Average Risk              5.0        4.4               2X Average Risk              9.6        7.1               3X Average Risk             23.4       11.0     Use the calculated Patient Ratio above and the CHD Risk table      to determine the patient's CHD Risk.     ATP III Classification (LDL):           < 100        mg/dL         Optimal          100 - 129     mg/dL         Near or Above Optimal  130 - 159     mg/dL         Borderline High          160 - 189     mg/dL         High           > 190        mg/dL         Very High          Assessment/Plan: Chronic pain syndrome Patient is not opioid naive.  Will start with Butrans 5 mg patch x 1 week.  Then increase to 10 mg patch.  Patient instructed on proper application of patch.  Follow-up in 1 month.  Again encouraged topical capsaicin for relief of hand pain.

## 2013-05-17 ENCOUNTER — Encounter: Payer: Self-pay | Admitting: *Deleted

## 2013-05-17 NOTE — Telephone Encounter (Signed)
Mailed letter about sleep study.  Will also remind him on Monday when he comes in for EMG.

## 2013-05-20 ENCOUNTER — Encounter: Payer: Self-pay | Admitting: Neurology

## 2013-05-20 ENCOUNTER — Ambulatory Visit (INDEPENDENT_AMBULATORY_CARE_PROVIDER_SITE_OTHER): Payer: No Typology Code available for payment source | Admitting: Neurology

## 2013-05-20 DIAGNOSIS — E1149 Type 2 diabetes mellitus with other diabetic neurological complication: Secondary | ICD-10-CM

## 2013-05-20 DIAGNOSIS — E1140 Type 2 diabetes mellitus with diabetic neuropathy, unspecified: Secondary | ICD-10-CM

## 2013-05-20 DIAGNOSIS — E1142 Type 2 diabetes mellitus with diabetic polyneuropathy: Secondary | ICD-10-CM

## 2013-05-20 NOTE — Progress Notes (Signed)
See procedure note for EMG results.  Donika K. Patel, DO  

## 2013-05-20 NOTE — Procedures (Signed)
[facility]  [address] [place], [address]  [place], [place] [address] Tel: [phone] Fax:  [phone] Test Date:  05/20/2013  Patient: Timothy Alvarez DOB: February 17, 1956 Physician: Nita Sickle, DO  Sex: Male Height: 5\' 6"  Ref Phys: Nita Sickle  ID#: [id number] Temp: 33.2C Technician:    Patient Complaints: This is a 58 year-old gentleman with bilateral hand paresthesia, weakness, and atrophy.  NCV & EMG Findings: Extensive evaluation of the right upper extremity with additional studies of the left reveals: 1. Absent median, ulnar, and radial responses bilaterally. 2. Absent median motor responses and barely present ulnar motor responses bilaterally. 3. Chronic motor axon loss changes are seen affecting the first dorsal interosseous, abductor digiti minimi, extensor indicis proprius, and triceps muscles bilaterally.  No motor units were recruited in the abductor pollicis brevis.  There is no evidence of active denervation.  Impression: There is electrophysiological evidence of a severe distal, symmetric, axonal polyneuropathy affecting bilateral upper extremities.   There is also evidence of a superimposed mild C8 radiculopathy.    ___________________________ Nita Sickle, DO    Nerve Conduction Studies Anti Sensory Summary Table   Site NR Peak (ms) Norm Peak (ms) P-T Amp (V) Norm P-T Amp  Left Median Anti Sensory (2nd Digit)  Wrist NR  <3.6  >15  Right Median Anti Sensory (2nd Digit)  Wrist NR  <3.6  >15  Left Radial Anti Sensory (Base 1st Digit)  Wrist NR  <2.7  >14  Right Radial Anti Sensory (Base 1st Digit)  Wrist NR  <2.7  >14  Left Ulnar Anti Sensory (5th Digit)  Wrist NR  <3.1  >10  Right Ulnar Anti Sensory (5th Digit)  Wrist NR  <3.1  >10   Motor Summary Table   Site NR Onset (ms) Norm Onset (ms) O-P Amp (mV) Norm O-P Amp Site1 Site2 Delta-0 (ms) Dist (cm) Vel (m/s) Norm Vel (m/s)  Left Median Motor (Abd Poll Brev)  Wrist NR  <4.0  >6 Elbow Wrist   0.0  >50  Elbow NR            Right Median Motor (Abd Poll Brev)  Wrist NR  <4.0  >6 Elbow Wrist  30.0  >50  Elbow NR            Left Ulnar Motor (Abd Dig Minimi)  Wrist    3.6 <3.1 0.8 >7 B Elbow Wrist 5.2 0.0  >50  B Elbow    8.8  0.4  A Elbow B Elbow  0.0  >50  A Elbow NR            Right Ulnar Motor (Abd Dig Minimi)  Wrist    4.3 <3.1 1.1 >7 B Elbow Wrist 5.7 25.0 44 >50  B Elbow    10.0  0.3  A Elbow B Elbow 3.8 10.0 26 >50  A Elbow    13.8  0.0  Post-exercise A Elbow 9.7 0.0    Post-exercise    4.1  1.0         Right Ulnar (FDI) Motor (1st DI)  Wrist    4.2 <4.5 0.1 >7         EMG   Side Muscle Ins Act Fibs Psw Fasc Number Recrt Dur Dur. Amp Amp. Poly Poly. Comment  Right 1stDorInt Nml Nml Nml Nml [facility]U Rapid All 1+ Nml Nml Nml Nml N/A  Right Abd Poll Brev Nml Nml Nml Nml NE None - - - - - - N/A  Right FlexPolLong Nml Nml Nml Nml 1- Mod-V Few 1- Nml Nml Nml Nml N/A  Right ABD Dig Min Nml Nml Nml Nml SMU Rapid All 1+ Nml Nml Nml Nml N/A  Right Ext Indicis Nml Nml Nml Nml 1- Mod-R Some 1+ Nml Nml Nml Nml N/A  Right PronatorTeres Nml Nml Nml Nml Nml Nml Nml Nml Nml Nml Nml Nml N/A  Right Biceps Nml Nml Nml Nml Nml Nml Nml Nml Nml Nml Nml Nml N/A  Right Triceps Nml Nml Nml Nml 1- Mod-R Some 1+ Nml Nml Nml Nml N/A  Right Deltoid Nml Nml Nml Nml Nml Nml Nml Nml Nml Nml Nml Nml N/A  Left 1stDorInt Nml Nml Nml Nml SMU Rapid All 1+ Nml Nml Nml Nml N/A  Left FlexPolLong Nml Nml Nml Nml Nml Nml Nml Nml Nml Nml Nml Nml N/A  Left Ext Indicis Nml Nml Nml Nml 1- Mod-R Some 1+ Nml Nml Nml Nml N/A  Left Triceps Nml Nml Nml Nml 1- Mod-R Some 1+ Nml Nml Nml Nml N/A  Left Abd Poll Brev Nml Nml Nml Nml NE None - - - - - - N/A      Waveforms:

## 2013-05-26 NOTE — Assessment & Plan Note (Signed)
Patient is not opioid naive.  Will start with Butrans 5 mg patch x 1 week.  Then increase to 10 mg patch.  Patient instructed on proper application of patch.  Follow-up in 1 month.  Again encouraged topical capsaicin for relief of hand pain.

## 2013-05-31 ENCOUNTER — Telehealth: Payer: Self-pay | Admitting: Cardiology

## 2013-05-31 NOTE — Telephone Encounter (Signed)
Records rec From Dr.Gardner Office gave to Victorio PalmDebra M 3.20.15/kdm

## 2013-06-06 ENCOUNTER — Ambulatory Visit: Payer: No Typology Code available for payment source | Admitting: Physician Assistant

## 2013-06-11 ENCOUNTER — Ambulatory Visit (HOSPITAL_BASED_OUTPATIENT_CLINIC_OR_DEPARTMENT_OTHER): Payer: No Typology Code available for payment source | Attending: Neurology | Admitting: Radiology

## 2013-06-11 VITALS — Ht 66.0 in | Wt 167.0 lb

## 2013-06-11 DIAGNOSIS — H3581 Retinal edema: Secondary | ICD-10-CM | POA: Insufficient documentation

## 2013-06-12 ENCOUNTER — Ambulatory Visit (INDEPENDENT_AMBULATORY_CARE_PROVIDER_SITE_OTHER): Payer: No Typology Code available for payment source | Admitting: Physician Assistant

## 2013-06-12 ENCOUNTER — Telehealth: Payer: Self-pay | Admitting: Physician Assistant

## 2013-06-12 ENCOUNTER — Encounter: Payer: Self-pay | Admitting: Physician Assistant

## 2013-06-12 VITALS — BP 118/76 | HR 79 | Temp 97.9°F | Resp 16 | Ht 66.0 in | Wt 168.8 lb

## 2013-06-12 DIAGNOSIS — G894 Chronic pain syndrome: Secondary | ICD-10-CM

## 2013-06-12 DIAGNOSIS — Z299 Encounter for prophylactic measures, unspecified: Secondary | ICD-10-CM

## 2013-06-12 DIAGNOSIS — M542 Cervicalgia: Secondary | ICD-10-CM

## 2013-06-12 MED ORDER — BUPRENORPHINE 10 MCG/HR TD PTWK: 10.0000 ug | MEDICATED_PATCH | TRANSDERMAL | Status: DC

## 2013-06-12 NOTE — Telephone Encounter (Signed)
Paperwork received for PA on Duloxetine HCL, form forward to nurse

## 2013-06-12 NOTE — Assessment & Plan Note (Signed)
Patient doing very well on Butrans 10 mg.  Increase fluid intake and take a daily fiber supplement to help prevent constipation. Will check CMP and Vitamin D level in 1 month.  Follow-up 3 months.

## 2013-06-12 NOTE — Telephone Encounter (Signed)
Received paperwork for PA on Butrans , forward to nurse

## 2013-06-12 NOTE — Progress Notes (Signed)
Patient presents to clinic today for follow-up of chronic pain.  At last visit patient was starts on Butrans patch.  Patient states the patch is controlling his pain well.  Denies nausea, vomiting or localized skin reaction. Patient endorses occasional constipation with medication.  Patient still taking other medications as directed.  Is walking more now that the weather has improved.  Would like to go to physical therapy to help with stretching and pain relief exercises.  Past Medical History  Diagnosis Date  . Diabetes type 1, uncontrolled   . Frequent headaches   . Heart attack 09.13.2014    x5  . Hyperlipidemia   . Chicken pox   . Mumps   . Measles, [place] (rubella)   . Scarlet fever   . Diabetic retinopathy   . Cataracts, bilateral   . Orthostatic hypotension   . CAD (coronary artery disease)   . Contrast dye induced nephropathy     Current Outpatient Prescriptions on File Prior to Visit  Medication Sig Dispense Refill  . aspirin 81 MG tablet Take 81 mg by mouth daily.      . carvedilol (COREG) 12.5 MG tablet Take 6.25 mg by mouth 2 (two) times daily with a meal.      . clopidogrel (PLAVIX) 75 MG tablet Take 75 mg by mouth daily with breakfast.      . co-enzyme Q-10 30 MG capsule Take 10 mg by mouth daily.      . DULoxetine (CYMBALTA) 60 MG capsule Take 1 capsule (60 mg total) by mouth daily.  30 capsule  2  . fludrocortisone (FLORINEF) 0.1 MG tablet Take 1 tablet (0.1 mg total) by mouth 2 (two) times daily.  60 tablet  2  . gabapentin (NEURONTIN) 600 MG tablet Take 1 tablet (600 mg total) by mouth 3 (three) times daily.  90 tablet  6  . glucagon 1 MG injection Inject 1 mg into the vein once as needed (Emergency Test Kit).      Marland Kitchen glucose blood test strip 1 each by Other route as directed. Use as instructed FREESTYLE IN VITRO      . insulin lispro (HUMALOG) 100 UNIT/ML injection Inject into the skin as directed. Use in Insulin Pump, Mas daily dose: 100 units/day      .  L-Methylfolate-Algae-B12-B6 (METANX) 3-90.314-2-35 MG CAPS Take 1 capsule by mouth 2 (two) times daily.      . midodrine (PROAMATINE) 10 MG tablet Take 1 tablet (10 mg total) by mouth 2 (two) times daily.  30 tablet  2  . rosuvastatin (CRESTOR) 10 MG tablet Take 10 mg by mouth daily.      Marland Kitchen senna (SENOKOT) 8.6 MG TABS tablet Take 1 tablet (8.6 mg total) by mouth daily.  30 each  2  . zolpidem (AMBIEN CR) 12.5 MG CR tablet Take 12.5 mg by mouth at bedtime.       No current facility-administered medications on file prior to visit.   No Known Allergies  Family History  Problem Relation Age of Onset  . Vascular Disease Father 76    Deceased  . Transient ischemic attack Father   . Hypertension Mother 63    Deceased  . Liver cancer Paternal Grandfather   . Breast cancer Sister   . Heart disease Sister     Ablation for rhythm disturbance  . Healthy Brother     x1  . Other Mother     s/p PPM    History   Social History  .  Marital Status: Married    Spouse Name: N/A    Number of Children: 1  . Years of Education: N/A   Social History Main Topics  . Smoking status: Never Smoker   . Smokeless tobacco: None  . Alcohol Use: Yes     Comment: Rare  . Drug Use: No  . Sexual Activity: None   Other Topics Concern  . 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  They have one grown daughter.   Highest level of education:  Master's Degree   Review of Systems - See HPI.  All other ROS are negative.  BP 118/76  Pulse 79  Temp(Src) 97.9 F (36.6 C) (Oral)  Resp 16  Ht 5' 6"  (1.676 m)  Wt 168 lb 12 oz (76.544 kg)  BMI 27.25 kg/m2  SpO2 99%  Physical Exam  Vitals reviewed. Constitutional: He is oriented to person, place, and time and well-developed, well-nourished, and in no distress.  HENT:  Head: Normocephalic and atraumatic.  Eyes: Conjunctivae are normal. Pupils are equal, round, and reactive to light.  Neck: Neck  supple.  Cardiovascular: Normal rate, regular rhythm, normal heart sounds and intact distal pulses.   Pulmonary/Chest: Effort normal and breath sounds normal. No respiratory distress. He has no wheezes. He has no rales. He exhibits no tenderness.  Neurological: He is alert and oriented to person, place, and time.  Psychiatric: Affect normal.    Recent Results (from the past 2160 hour(s))  CBC WITH DIFFERENTIAL     Status: None   Collection Time    03/29/13  5:23 PM      Result Value Ref Range   WBC 6.0  4.0 - 10.5 K/uL   RBC 4.68  4.22 - 5.81 MIL/uL   Hemoglobin 13.2  13.0 - 17.0 g/dL   HCT 39.3  39.0 - 52.0 %   MCV 84.0  78.0 - 100.0 fL   MCH 28.2  26.0 - 34.0 pg   MCHC 33.6  30.0 - 36.0 g/dL   RDW 15.0  11.5 - 15.5 %   Platelets 226  150 - 400 K/uL   Neutrophils Relative % 63  43 - 77 %   Neutro Abs 3.8  1.7 - 7.7 K/uL   Lymphocytes Relative 27  12 - 46 %   Lymphs Abs 1.6  0.7 - 4.0 K/uL   Monocytes Relative 6  3 - 12 %   Monocytes Absolute 0.4  0.1 - 1.0 K/uL   Eosinophils Relative 3  0 - 5 %   Eosinophils Absolute 0.2  0.0 - 0.7 K/uL   Basophils Relative 1  0 - 1 %   Basophils Absolute 0.0  0.0 - 0.1 K/uL   Smear Review Criteria for review not met    BASIC METABOLIC PANEL     Status: Abnormal   Collection Time    03/29/13  5:23 PM      Result Value Ref Range   Sodium 139  135 - 145 mEq/L   Potassium 4.6  3.5 - 5.3 mEq/L   Chloride 101  96 - 112 mEq/L   CO2 31  19 - 32 mEq/L   Glucose, Bld 113 (*) 70 - 99 mg/dL   BUN 15  6 - 23 mg/dL   Creat 1.14  0.50 - 1.35 mg/dL   Calcium 8.8  8.4 - 10.5 mg/dL  FERRITIN     Status: None  Collection Time    03/29/13  5:23 PM      Result Value Ref Range   Ferritin 87  22 - 322 ng/mL  CBC WITH DIFFERENTIAL     Status: Abnormal   Collection Time    04/09/13  4:30 PM      Result Value Ref Range   WBC 5.8  4.0 - 10.5 K/uL   RBC 4.09 (*) 4.22 - 5.81 MIL/uL   Hemoglobin 11.8 (*) 13.0 - 17.0 g/dL   HCT 35.3 (*) 39.0 - 52.0 %    MCV 86.3  78.0 - 100.0 fL   MCH 28.9  26.0 - 34.0 pg   MCHC 33.4  30.0 - 36.0 g/dL   RDW 13.4  11.5 - 15.5 %   Platelets 204  150 - 400 K/uL   Neutrophils Relative % 67  43 - 77 %   Neutro Abs 3.9  1.7 - 7.7 K/uL   Lymphocytes Relative 19  12 - 46 %   Lymphs Abs 1.1  0.7 - 4.0 K/uL   Monocytes Relative 10  3 - 12 %   Monocytes Absolute 0.6  0.1 - 1.0 K/uL   Eosinophils Relative 4  0 - 5 %   Eosinophils Absolute 0.2  0.0 - 0.7 K/uL   Basophils Relative 1  0 - 1 %   Basophils Absolute 0.0  0.0 - 0.1 K/uL  BASIC METABOLIC PANEL     Status: Abnormal   Collection Time    04/09/13  4:30 PM      Result Value Ref Range   Sodium 139  137 - 147 mEq/L   Potassium 4.2  3.7 - 5.3 mEq/L   Chloride 99  96 - 112 mEq/L   CO2 26  19 - 32 mEq/L   Glucose, Bld 260 (*) 70 - 99 mg/dL   BUN 15  6 - 23 mg/dL   Creatinine, Ser 0.80  0.50 - 1.35 mg/dL   Calcium 9.0  8.4 - 10.5 mg/dL   GFR calc non Af Amer >90  >90 mL/min   GFR calc Af Amer >90  >90 mL/min   Comment: (NOTE)     The eGFR has been calculated using the CKD EPI equation.     This calculation has not been validated in all clinical situations.     eGFR's persistently <90 mL/min signify possible Chronic Kidney     Disease.  TROPONIN I     Status: None   Collection Time    04/09/13  4:30 PM      Result Value Ref Range   Troponin I <0.30  <0.30 ng/mL   Comment:            Due to the release kinetics of cTnI,     a negative result within the first hours     of the onset of symptoms does not rule out     myocardial infarction with certainty.     If myocardial infarction is still suspected,     repeat the test at appropriate intervals.  CBC WITH DIFFERENTIAL     Status: Abnormal   Collection Time    04/12/13  2:42 PM      Result Value Ref Range   WBC 8.7  4.0 - 10.5 K/uL   RBC 4.36  4.22 - 5.81 MIL/uL   Hemoglobin 12.3 (*) 13.0 - 17.0 g/dL   HCT 37.3 (*) 39.0 - 52.0 %   MCV 85.6  78.0 - 100.0 fL  MCH 28.2  26.0 - 34.0 pg   MCHC  33.0  30.0 - 36.0 g/dL   RDW 15.1  11.5 - 15.5 %   Platelets 282  150 - 400 K/uL   Neutrophils Relative % 82 (*) 43 - 77 %   Neutro Abs 7.1  1.7 - 7.7 K/uL   Lymphocytes Relative 10 (*) 12 - 46 %   Lymphs Abs 0.9  0.7 - 4.0 K/uL   Monocytes Relative 6  3 - 12 %   Monocytes Absolute 0.6  0.1 - 1.0 K/uL   Eosinophils Relative 2  0 - 5 %   Eosinophils Absolute 0.1  0.0 - 0.7 K/uL   Basophils Relative 0  0 - 1 %   Basophils Absolute 0.0  0.0 - 0.1 K/uL   Smear Review Criteria for review not met    COMPLETE METABOLIC PANEL WITH GFR     Status: Abnormal   Collection Time    04/12/13  2:42 PM      Result Value Ref Range   Sodium 137  135 - 145 mEq/L   Potassium 4.4  3.5 - 5.3 mEq/L   Chloride 101  96 - 112 mEq/L   CO2 29  19 - 32 mEq/L   Glucose, Bld 370 (*) 70 - 99 mg/dL   BUN 15  6 - 23 mg/dL   Creat 1.08  0.50 - 1.35 mg/dL   Total Bilirubin 0.7  0.2 - 1.2 mg/dL   Comment: ** Please note change in reference range(s). **   Alkaline Phosphatase 198 (*) 39 - 117 U/L   AST 69 (*) 0 - 37 U/L   ALT 145 (*) 0 - 53 U/L   Total Protein 6.2  6.0 - 8.3 g/dL   Albumin 3.5  3.5 - 5.2 g/dL   Calcium 8.8  8.4 - 10.5 mg/dL   GFR, Est African American 86     GFR, Est Non African American 75     Comment:       The estimated GFR is a calculation valid for adults (>=61 years old)     that uses the CKD-EPI algorithm to adjust for age and sex. It is       not to be used for children, pregnant women, hospitalized patients,        patients on dialysis, or with rapidly changing kidney function.     According to the NKDEP, eGFR >89 is normal, 60-89 shows mild     impairment, 30-59 shows moderate impairment, 15-29 shows severe     impairment and <15 is ESRD.        HEPATIC FUNCTION PANEL     Status: None   Collection Time    04/24/13  9:58 AM      Result Value Ref Range   Total Bilirubin 0.4  0.2 - 1.2 mg/dL   Comment: ** Please note change in reference range(s). **   Bilirubin, Direct 0.1  0.0 -  0.3 mg/dL   Indirect Bilirubin 0.3  0.2 - 1.2 mg/dL   Comment: ** Please note change in reference range(s). **   Alkaline Phosphatase 103  39 - 117 U/L   AST 26  0 - 37 U/L   ALT 32  0 - 53 U/L   Total Protein 6.2  6.0 - 8.3 g/dL   Albumin 3.6  3.5 - 5.2 g/dL  HEPATITIS PANEL, ACUTE     Status: None   Collection Time    04/24/13  9:58 AM      Result Value Ref Range   Hepatitis B Surface Ag NEGATIVE  NEGATIVE   HCV Ab NEGATIVE  NEGATIVE   Hep B C IgM NON REACTIVE  NON REACTIVE   Comment: High levels of Hepatitis B Core IgM antibody are detectable     during the acute stage of Hepatitis B. This antibody is used     to differentiate current from past HBV infection.         Hep A IgM NON REACTIVE  NON REACTIVE  LIPID PANEL     Status: None   Collection Time    05/08/13 11:43 AM      Result Value Ref Range   Cholesterol 139  0 - 200 mg/dL   Comment: ATP III Classification:           < 200        mg/dL        Desirable          200 - 239     mg/dL        Borderline High          >= 240        mg/dL        High         Triglycerides 79  <150 mg/dL   HDL 57  >39 mg/dL   Total CHOL/HDL Ratio 2.4     VLDL 16  0 - 40 mg/dL   LDL Cholesterol 66  0 - 99 mg/dL   Comment:       Total Cholesterol/HDL Ratio:CHD Risk                            Coronary Heart Disease Risk Table                                            Men       Women              1/2 Average Risk              3.4        3.3                  Average Risk              5.0        4.4               2X Average Risk              9.6        7.1               3X Average Risk             23.4       11.0     Use the calculated Patient Ratio above and the CHD Risk table      to determine the patient's CHD Risk.     ATP III Classification (LDL):           < 100        mg/dL         Optimal          100 - 129     mg/dL  Near or Above Optimal          130 - 159     mg/dL         Borderline High          160 - 189     mg/dL          High           > 190        mg/dL         Very High          Assessment/Plan: Chronic pain syndrome Patient doing very well on Butrans 10 mg.  Increase fluid intake and take a daily fiber supplement to help prevent constipation. Will check CMP and Vitamin D level in 1 month.  Follow-up 3 months.

## 2013-06-12 NOTE — Patient Instructions (Signed)
Continue medications as prescribed.  Return to clinic in 1 month for repeat lab work to assess liver function and vitamin D level.  Follow-up with specialists as directed.  You will be contacted for appointment with Physical Therapy.

## 2013-06-14 ENCOUNTER — Ambulatory Visit: Payer: No Typology Code available for payment source | Admitting: Neurology

## 2013-06-17 NOTE — Telephone Encounter (Signed)
[facility] [phone]

## 2013-06-17 NOTE — Telephone Encounter (Signed)
Please advise pt on status of PA. He is a patient of Malva CoganCody Martin, this form did not come to [facility].  Note forwarded to Burnard LeighSharon Scates, CMA for follow up

## 2013-06-18 NOTE — Telephone Encounter (Signed)
Insurance faxed another form, form forward to nurse

## 2013-06-18 NOTE — Telephone Encounter (Signed)
Patient wife called back regarding this. Would like a callback as soon as possible.

## 2013-06-20 NOTE — Telephone Encounter (Signed)
Approval faxed to pharmacy/sls

## 2013-06-20 NOTE — Telephone Encounter (Signed)
Insurance approved from 06/20/13-06/20/16

## 2013-06-21 ENCOUNTER — Telehealth: Payer: Self-pay | Admitting: Neurology

## 2013-06-21 ENCOUNTER — Ambulatory Visit: Payer: No Typology Code available for payment source | Admitting: Neurology

## 2013-06-21 ENCOUNTER — Encounter: Payer: Self-pay | Admitting: Neurology

## 2013-06-21 DIAGNOSIS — G4730 Sleep apnea, unspecified: Secondary | ICD-10-CM

## 2013-06-21 DIAGNOSIS — G4710 Hypersomnia, unspecified: Secondary | ICD-10-CM

## 2013-06-21 NOTE — Telephone Encounter (Signed)
Spoke with patient's wife.  She has submitted an appeal concerning rejection of Butrans.  They paid OOP for the prescription this month because the medication is working so well.  If appeal is denied will start trial of Opana or generic Morphine Sulfate to satisfy PA requirements.

## 2013-06-21 NOTE — Telephone Encounter (Signed)
Received Denial on pt's Butrans Patch request, forwarded to provider for further medical necessity and instructions/SLS

## 2013-06-21 NOTE — Sleep Study (Signed)
   NAME: Timothy Alvarez DATE OF BIRTH:  10-08-1955 MEDICAL RECORD NUMBER [id number]  LOCATION: [facility]  PHYSICIAN: Maree KrabbeKeith M Clance  DATE OF STUDY: 06/11/2013  SLEEP STUDY TYPE: Nocturnal Polysomnogram               REFERRING PHYSICIAN: Glendale ChardPatel, Donika K, MD  INDICATION FOR STUDY: Hypersomnia with sleep apnea  EPWORTH SLEEPINESS SCORE:  15 HEIGHT: 5\' 6"  (167.6 cm)  WEIGHT: 167 lb (75.751 kg)    Body mass index is 26.97 kg/(m^2).  NECK SIZE: 15 in.  MEDICATIONS: Reviewed in the sleep record  SLEEP ARCHITECTURE: The patient had a total sleep time of 248 minutes with no slow-wave sleep and only 3 minutes of REM. Sleep onset latency was very prolonged at 158 minutes, and REM latency was prolonged at 137 minutes. Sleep efficiency was poor at 59%.  RESPIRATORY DATA: The patient was found to have 31 central and obstructive apneas, as well as 49 obstructive hypopneas. This gave him an apnea hypopnea index of 19 events per hour. The events occurred in all body positions, and there was mild snoring noted throughout. It should be noted the patient had very little REM during the night.    OXYGEN DATA: There was oxygen desaturation as low as 77% with the patient's obstructive events  CARDIAC DATA: Occasional PAC and PVC noted  MOVEMENT/PARASOMNIA: No significant periodic limb movements or abnormal behaviors were noted.  IMPRESSION/ RECOMMENDATION:    1)  moderate obstructive and central sleep apnea, with an AHI of 19 events per hour and oxygen desaturation as low as 77%. Treatment for this degree of sleep apnea can include a trial of weight loss alone if applicable, upper airway surgery, dental appliance, and also some type of positive pressure device. Clinical correlation is suggested.  2)  occasional PAC and PVC noted.     Barbaraann ShareKeith M Clance Diplomate, American Board of Sleep Medicine  ELECTRONICALLY SIGNED ON:  06/21/2013, 5:55 PM [facility] PH:  [phone]   FX: [phone] ACCREDITED BY THE AMERICAN ACADEMY OF SLEEP MEDICINE

## 2013-06-21 NOTE — Telephone Encounter (Signed)
Pt no showed today's follow up appt with Dr. Jaffe. No show letter mailed to pt / Sherri S.  °

## 2013-06-24 ENCOUNTER — Ambulatory Visit: Payer: No Typology Code available for payment source | Attending: Physician Assistant | Admitting: Physical Therapy

## 2013-06-24 DIAGNOSIS — M25519 Pain in unspecified shoulder: Secondary | ICD-10-CM | POA: Insufficient documentation

## 2013-06-24 DIAGNOSIS — IMO0001 Reserved for inherently not codable concepts without codable children: Secondary | ICD-10-CM | POA: Insufficient documentation

## 2013-06-24 DIAGNOSIS — M6281 Muscle weakness (generalized): Secondary | ICD-10-CM | POA: Insufficient documentation

## 2013-06-24 DIAGNOSIS — R5381 Other malaise: Secondary | ICD-10-CM | POA: Insufficient documentation

## 2013-06-24 DIAGNOSIS — M542 Cervicalgia: Secondary | ICD-10-CM | POA: Insufficient documentation

## 2013-06-24 DIAGNOSIS — R293 Abnormal posture: Secondary | ICD-10-CM | POA: Insufficient documentation

## 2013-06-24 NOTE — Telephone Encounter (Signed)
If letter has not already been sent, let's hold off on sending it.    Discuss results of sleep study which shows evidence of obstructive sleep apnea, so will send a referral to pulmonology for management. He apparently has had 2 types of face masks in the past which he did not tolerate.   Donika K. Allena KatzPatel, DO

## 2013-06-25 NOTE — Telephone Encounter (Signed)
LM for pt to call me back. 

## 2013-06-26 NOTE — Telephone Encounter (Signed)
LM for pt to call me back. 

## 2013-06-27 ENCOUNTER — Encounter: Payer: Self-pay | Admitting: *Deleted

## 2013-06-27 ENCOUNTER — Other Ambulatory Visit: Payer: Self-pay | Admitting: *Deleted

## 2013-06-27 DIAGNOSIS — G4733 Obstructive sleep apnea (adult) (pediatric): Secondary | ICD-10-CM

## 2013-06-27 NOTE — Telephone Encounter (Signed)
LM for pt to call me back. 

## 2013-06-27 NOTE — Telephone Encounter (Signed)
Letter mailed.  Waiting for pt to call me back also.

## 2013-07-03 ENCOUNTER — Ambulatory Visit: Payer: No Typology Code available for payment source | Admitting: Rehabilitation

## 2013-07-03 ENCOUNTER — Telehealth: Payer: Self-pay | Admitting: Physician Assistant

## 2013-07-03 NOTE — Telephone Encounter (Signed)
Spoke w/pt's spouse [Martha]; she states that the appeal failed w/Insurance not giving them a reason as to why medication was previously covered and was only told that "Rx is not on formulary"?. Patient is willing to pay OOP, d/t medication being satisfactory for pain relief, instead of changing to formulary suggested by [employer]. Pt has been able to get Med Rx Assistance in form of discount card to help with cost of medication; provider informed/SLS

## 2013-07-03 NOTE — Telephone Encounter (Signed)
Can we contacted the patient and his wife to verify if the appeal for Butrans went through?  I have not seen anything across my desk, nor have I heard anything from patient or his wife.

## 2013-07-04 ENCOUNTER — Other Ambulatory Visit: Payer: Self-pay | Admitting: *Deleted

## 2013-07-04 DIAGNOSIS — G4733 Obstructive sleep apnea (adult) (pediatric): Secondary | ICD-10-CM

## 2013-07-04 NOTE — Telephone Encounter (Signed)
Appointment scheduled for May 6 at 10:00 with Dr. Craige CottaSood.  Attempted to contact patient several times and letter has been sent.

## 2013-07-05 ENCOUNTER — Ambulatory Visit: Payer: No Typology Code available for payment source | Admitting: Rehabilitation

## 2013-07-08 ENCOUNTER — Ambulatory Visit: Payer: No Typology Code available for payment source | Admitting: Rehabilitation

## 2013-07-10 ENCOUNTER — Other Ambulatory Visit: Payer: Self-pay | Admitting: Physician Assistant

## 2013-07-10 ENCOUNTER — Telehealth: Payer: Self-pay | Admitting: Physician Assistant

## 2013-07-10 DIAGNOSIS — R5383 Other fatigue: Secondary | ICD-10-CM

## 2013-07-10 DIAGNOSIS — R5381 Other malaise: Secondary | ICD-10-CM

## 2013-07-10 DIAGNOSIS — G894 Chronic pain syndrome: Secondary | ICD-10-CM

## 2013-07-10 MED ORDER — BUPRENORPHINE 10 MCG/HR TD PTWK
10.0000 ug | MEDICATED_PATCH | TRANSDERMAL | Status: DC
Start: 2013-07-10 — End: 2013-08-09

## 2013-07-10 NOTE — Telephone Encounter (Signed)
Lab orders are placed for CMP and Vitamin D levels.  Patient can come to lab at his convenience.  Will call him with the results.

## 2013-07-10 NOTE — Telephone Encounter (Signed)
Rx printed for fill on or after 05.01.15; lab orders placed, pt's spouse informed, understood & agreed/SLS

## 2013-07-11 ENCOUNTER — Ambulatory Visit: Payer: No Typology Code available for payment source | Admitting: Rehabilitation

## 2013-07-12 ENCOUNTER — Ambulatory Visit: Payer: No Typology Code available for payment source | Attending: Physician Assistant | Admitting: Rehabilitation

## 2013-07-12 DIAGNOSIS — M6281 Muscle weakness (generalized): Secondary | ICD-10-CM | POA: Diagnosis not present

## 2013-07-12 DIAGNOSIS — IMO0001 Reserved for inherently not codable concepts without codable children: Secondary | ICD-10-CM | POA: Diagnosis present

## 2013-07-12 DIAGNOSIS — R293 Abnormal posture: Secondary | ICD-10-CM | POA: Diagnosis not present

## 2013-07-12 DIAGNOSIS — M542 Cervicalgia: Secondary | ICD-10-CM | POA: Diagnosis not present

## 2013-07-12 DIAGNOSIS — M25519 Pain in unspecified shoulder: Secondary | ICD-10-CM | POA: Insufficient documentation

## 2013-07-12 DIAGNOSIS — R5381 Other malaise: Secondary | ICD-10-CM | POA: Insufficient documentation

## 2013-07-14 ENCOUNTER — Other Ambulatory Visit: Payer: Self-pay | Admitting: Family

## 2013-07-14 DIAGNOSIS — E271 Primary adrenocortical insufficiency: Secondary | ICD-10-CM

## 2013-07-15 ENCOUNTER — Ambulatory Visit: Payer: No Typology Code available for payment source | Admitting: Rehabilitation

## 2013-07-15 DIAGNOSIS — IMO0001 Reserved for inherently not codable concepts without codable children: Secondary | ICD-10-CM | POA: Diagnosis not present

## 2013-07-15 NOTE — Telephone Encounter (Signed)
Refill for Florinef sent to [facility] in [place]

## 2013-07-16 LAB — COMPREHENSIVE METABOLIC PANEL
ALBUMIN: 3.9 g/dL (ref 3.5–5.2)
ALT: 25 U/L (ref 0–53)
AST: 20 U/L (ref 0–37)
Alkaline Phosphatase: 106 U/L (ref 39–117)
BUN: 20 mg/dL (ref 6–23)
CALCIUM: 9.1 mg/dL (ref 8.4–10.5)
CHLORIDE: 103 meq/L (ref 96–112)
CO2: 27 mEq/L (ref 19–32)
Creat: 1.2 mg/dL (ref 0.50–1.35)
Glucose, Bld: 119 mg/dL — ABNORMAL HIGH (ref 70–99)
Potassium: 4.2 mEq/L (ref 3.5–5.3)
Sodium: 143 mEq/L (ref 135–145)
Total Bilirubin: 0.3 mg/dL (ref 0.2–1.2)
Total Protein: 6.3 g/dL (ref 6.0–8.3)

## 2013-07-17 ENCOUNTER — Ambulatory Visit (INDEPENDENT_AMBULATORY_CARE_PROVIDER_SITE_OTHER): Payer: No Typology Code available for payment source | Admitting: Pulmonary Disease

## 2013-07-17 ENCOUNTER — Telehealth: Payer: Self-pay | Admitting: Pulmonary Disease

## 2013-07-17 ENCOUNTER — Encounter: Payer: Self-pay | Admitting: Pulmonary Disease

## 2013-07-17 VITALS — BP 110/84 | HR 76 | Ht 66.0 in | Wt 176.0 lb

## 2013-07-17 DIAGNOSIS — G4733 Obstructive sleep apnea (adult) (pediatric): Secondary | ICD-10-CM

## 2013-07-17 HISTORY — DX: Obstructive sleep apnea (adult) (pediatric): G47.33

## 2013-07-17 NOTE — Progress Notes (Deleted)
   Subjective:    Patient ID: Ignacia Bayleyhomas Loera, male    DOB: 08/10/1955, 58 y.o.   MRN: [id number]  HPI    Review of Systems  Constitutional: Negative for fever and unexpected weight change.  HENT: Negative for congestion, dental problem, ear pain, nosebleeds, postnasal drip, rhinorrhea, sinus pressure, sneezing, sore throat and trouble swallowing.   Eyes: Negative for redness and itching.  Respiratory: Negative for cough, chest tightness, shortness of breath and wheezing.   Cardiovascular: Positive for palpitations. Negative for leg swelling.  Gastrointestinal: Negative for nausea and vomiting.  Genitourinary: Negative for dysuria.  Musculoskeletal: Negative for joint swelling.  Skin: Negative for rash.  Neurological: Positive for headaches.  Hematological: Does not bruise/bleed easily.  Psychiatric/Behavioral: Negative for dysphoric mood. The patient is not nervous/anxious.        Objective:   Physical Exam        Assessment & Plan:

## 2013-07-17 NOTE — Patient Instructions (Signed)
Will arrange for CPAP set up Follow up in 2 months 

## 2013-07-17 NOTE — Telephone Encounter (Signed)
Order has been sent to [facility] for cpap/supplies.  This is out of network for the pt.  [facility]  Kim from [facility] is aware.

## 2013-07-17 NOTE — Progress Notes (Signed)
Chief Complaint  Patient presents with  . Sleep Consult    referred by Dr. Posey Pronto for OSA    History of Present Illness: Timothy Alvarez is a 58 y.o. male for evaluation of sleep problems.  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  He is followed by ophthalmology for pressure behind his eyes.  There was concern sleep apnea could be contributing to this.  He was noted to have difficulty with his sleep, and has history of sleep apnea.  He had sleep study which showed moderate sleep apnea.  He was previously diagnosed with sleep apnea while living in [place] and [place].  He was tried on CPAP.  This helped his sleep.  He felt like he was getting too much pressure from his machine and his mask wouldn't fit correctly.  He was tried on full face mask and nasal pillows.  He had full beard at that time, and this caused trouble with his mask fit.  He is tired all the time.  He snores, stops breathing, and wakes up with a gasp.  He takes ambien at 10 pm, and then goes to sleep at midnight.  He falls asleep after 15.  He wakes up two times to use the bathroom.  He gets out of bed at 830 am.  He feels tired in the morning.  He denies morning headache.  He does not use anything to help him stay awake.  His neuropathy pain also caused trouble with his sleep >> this has improved considerably since he was started on buprenorphine patch.  He denies sleep walking, sleep talking, bruxism, or nightmares.  There is no history of restless legs.  He denies sleep hallucinations, sleep paralysis, or cataplexy.  The Epworth score is 12 out of 24.  Tests: PSG 06/11/13 >> AHI 19, SaO2 low 77%  Timothy Alvarez  has a past medical history of Diabetes type 1, uncontrolled; Frequent headaches; Heart attack (09.13.2014); Hyperlipidemia; Chicken pox; Mumps; Measles, [place] (rubella); Scarlet fever; Diabetic retinopathy; Cataracts, bilateral; Orthostatic hypotension; CAD (coronary artery disease); and Contrast dye  induced nephropathy.  Timothy Alvarez  has past surgical history that includes Coronary angioplasty with stent; Appendectomy (1966); Wisdom tooth extraction; and Refractive surgery.  Prior to Admission medications   Medication Sig Start Date End Date Taking? Authorizing Provider  aspirin 81 MG tablet Take 81 mg by mouth daily.   Yes Historical Provider, MD  buprenorphine (BUTRANS) 10 MCG/HR PTWK patch Place 1 patch (10 mcg total) onto the skin once a week. 07/10/13  Yes Leeanne Rio, PA-C  carvedilol (COREG) 12.5 MG tablet Take 6.25 mg by mouth 2 (two) times daily with a meal.   Yes Historical Provider, MD  clopidogrel (PLAVIX) 75 MG tablet Take 75 mg by mouth daily with breakfast.   Yes Historical Provider, MD  co-enzyme Q-10 30 MG capsule Take 10 mg by mouth daily.   Yes Historical Provider, MD  DULoxetine (CYMBALTA) 60 MG capsule Take 1 capsule (60 mg total) by mouth daily. 05/10/13  Yes Leeanne Rio, PA-C  fludrocortisone (FLORINEF) 0.1 MG tablet TAKE 1 TABLET BY MOUTH TWICE DAILY   Yes Debbrah Alar, NP  gabapentin (NEURONTIN) 600 MG tablet 67m AM, 3057mwith lunch, 60083mM 05/07/13  Yes DonAlda BertholdD  glucagon 1 MG injection Inject 1 mg into the vein once as needed (Emergency Test Kit).   Yes Historical Provider, MD  glucose blood test strip 1 each by Other route as directed. Use  as instructed FREESTYLE IN VITRO   Yes Historical Provider, MD  insulin lispro (HUMALOG) 100 UNIT/ML injection Inject into the skin as directed. Use in Insulin Pump, Mas daily dose: 100 units/day   Yes Historical Provider, MD  L-Methylfolate-Algae-B12-B6 Glade Stanford) 3-90.314-2-35 MG CAPS Take 1 capsule by mouth 2 (two) times daily.   Yes Historical Provider, MD  midodrine (PROAMATINE) 10 MG tablet Take 1 tablet (10 mg total) by mouth 2 (two) times daily. 04/24/13  Yes Debbrah Alar, NP  rosuvastatin (CRESTOR) 10 MG tablet Take 10 mg by mouth daily.   Yes Historical Provider, MD  senna (SENOKOT)  8.6 MG TABS tablet Take 1 tablet (8.6 mg total) by mouth daily. 04/24/13  Yes Debbrah Alar, NP  zolpidem (AMBIEN CR) 12.5 MG CR tablet Take 12.5 mg by mouth at bedtime.   Yes Historical Provider, MD    No Known Allergies  His family history includes Breast cancer in his sister; Healthy in his brother; Heart disease in his sister; Hypertension (age of onset: 30) in his mother; Liver cancer in his paternal grandfather; Other in his mother; Transient ischemic attack in his father; Vascular Disease (age of onset: 18) in his father.  He  reports that he has never smoked. He does not have any smokeless tobacco history on file. He reports that he drinks alcohol. He reports that he does not use illicit drugs.  Review of Systems  Constitutional: Negative for fever and unexpected weight change.  HENT: Negative for congestion, dental problem, ear pain, nosebleeds, postnasal drip, rhinorrhea, sinus pressure, sneezing, sore throat and trouble swallowing.   Eyes: Negative for redness and itching.  Respiratory: Negative for cough, chest tightness, shortness of breath and wheezing.   Cardiovascular: Positive for palpitations. Negative for leg swelling.  Gastrointestinal: Negative for nausea and vomiting.  Genitourinary: Negative for dysuria.  Musculoskeletal: Negative for joint swelling.  Skin: Negative for rash.  Neurological: Positive for headaches.  Hematological: Does not bruise/bleed easily.  Psychiatric/Behavioral: Negative for dysphoric mood. The patient is not nervous/anxious.    Physical Exam:  General - No distress ENT - No sinus tenderness, no oral exudate, no LAN, no thyromegaly, TM clear, pupils equal/reactive, MP 3, scalloped tongue, high arched palate, decreased AP diameter Cardiac - s1s2 regular, no murmur, pulses symmetric Chest - No wheeze/rales/dullness, good air entry, normal respiratory excursion Back - No focal tenderness Abd - Soft, non-tender, no organomegaly, + bowel  sounds Ext - No edema Neuro - Normal strength, cranial nerves intact Skin - No rashes Psych - Normal mood, and behavior  Assessment/plan:  Chesley Mires, M.D. Pager [phone]

## 2013-07-17 NOTE — Assessment & Plan Note (Signed)
He has moderate sleep apnea.  He has hx of CAD, DM, and chronic pain.  I have reviewed the recent sleep study results with the patient.  We discussed how sleep apnea can affect various health problems including risks for hypertension, cardiovascular disease, and diabetes.  We also discussed how sleep disruption can increase risks for accident, such as while driving.  Weight loss as a means of improving sleep apnea was also reviewed.  Additional treatment options discussed were CPAP therapy, oral appliance, and surgical intervention.  Will arrange for auto CPAP set up.

## 2013-07-18 ENCOUNTER — Ambulatory Visit: Payer: No Typology Code available for payment source | Admitting: Rehabilitation

## 2013-07-18 ENCOUNTER — Telehealth: Payer: Self-pay | Admitting: Physician Assistant

## 2013-07-18 DIAGNOSIS — G4700 Insomnia, unspecified: Secondary | ICD-10-CM

## 2013-07-18 DIAGNOSIS — IMO0001 Reserved for inherently not codable concepts without codable children: Secondary | ICD-10-CM | POA: Diagnosis not present

## 2013-07-18 MED ORDER — ZOLPIDEM TARTRATE ER 12.5 MG PO TBCR: 12.5000 mg | EXTENDED_RELEASE_TABLET | Freq: Every day | ORAL | Status: DC

## 2013-07-18 NOTE — Telephone Encounter (Signed)
Printed, signed, handed to CMA for fax to pharmacy.  Please call patient to tell him Rx should be ready for pick up this afternoon.

## 2013-07-18 NOTE — Telephone Encounter (Signed)
Requesting refill on Zolpidem ER 12.5 mg  [facility]

## 2013-07-18 NOTE — Telephone Encounter (Signed)
Rx faxed to the pharmacy.  LMOM @ (1:46pm) informing the pt that rx he requested is ready for him to pick-up this afternoon.//AB/CMA

## 2013-07-19 ENCOUNTER — Other Ambulatory Visit: Payer: Self-pay | Admitting: Family

## 2013-07-21 LAB — VITAMIN D 1,25 DIHYDROXY
VITAMIN D 1, 25 (OH) TOTAL: 48 pg/mL (ref 18–72)
VITAMIN D3 1, 25 (OH): 48 pg/mL

## 2013-07-22 ENCOUNTER — Ambulatory Visit: Payer: No Typology Code available for payment source | Admitting: Rehabilitation

## 2013-07-22 DIAGNOSIS — IMO0001 Reserved for inherently not codable concepts without codable children: Secondary | ICD-10-CM | POA: Diagnosis not present

## 2013-07-23 ENCOUNTER — Telehealth: Payer: Self-pay | Admitting: Pulmonary Disease

## 2013-07-23 NOTE — Telephone Encounter (Signed)
I'm not showing anything in EPIC were a nurse tried to call.  She did schedule pt an appt to see VS in July. Nothing further needed

## 2013-07-25 ENCOUNTER — Ambulatory Visit: Payer: No Typology Code available for payment source | Admitting: Rehabilitation

## 2013-07-25 ENCOUNTER — Telehealth: Payer: Self-pay | Admitting: Pulmonary Disease

## 2013-07-25 DIAGNOSIS — G4733 Obstructive sleep apnea (adult) (pediatric): Secondary | ICD-10-CM

## 2013-07-25 NOTE — Telephone Encounter (Signed)
[facility].

## 2013-07-29 ENCOUNTER — Ambulatory Visit: Payer: No Typology Code available for payment source | Admitting: Rehabilitation

## 2013-07-29 DIAGNOSIS — IMO0001 Reserved for inherently not codable concepts without codable children: Secondary | ICD-10-CM | POA: Diagnosis not present

## 2013-07-31 ENCOUNTER — Ambulatory Visit: Payer: No Typology Code available for payment source | Admitting: Rehabilitation

## 2013-08-07 ENCOUNTER — Ambulatory Visit: Payer: No Typology Code available for payment source | Admitting: Rehabilitation

## 2013-08-09 ENCOUNTER — Other Ambulatory Visit: Payer: Self-pay | Admitting: Physician Assistant

## 2013-08-12 ENCOUNTER — Ambulatory Visit: Payer: No Typology Code available for payment source | Attending: Physician Assistant | Admitting: Rehabilitation

## 2013-08-12 DIAGNOSIS — M6281 Muscle weakness (generalized): Secondary | ICD-10-CM | POA: Insufficient documentation

## 2013-08-12 DIAGNOSIS — M542 Cervicalgia: Secondary | ICD-10-CM | POA: Diagnosis not present

## 2013-08-12 DIAGNOSIS — IMO0001 Reserved for inherently not codable concepts without codable children: Secondary | ICD-10-CM | POA: Diagnosis present

## 2013-08-12 DIAGNOSIS — R293 Abnormal posture: Secondary | ICD-10-CM | POA: Insufficient documentation

## 2013-08-12 DIAGNOSIS — M25519 Pain in unspecified shoulder: Secondary | ICD-10-CM | POA: Diagnosis not present

## 2013-08-12 DIAGNOSIS — R5381 Other malaise: Secondary | ICD-10-CM | POA: Diagnosis not present

## 2013-08-12 NOTE — Telephone Encounter (Signed)
Requested Prescriptions   Signed Prescriptions Disp Refills  . BUTRANS 10 MCG/HR PTWK patch 4 patch 0    Sig: PLACE 1 PATCH ONTO THE SKIN ONCE A WEEK    Authorizing Provider: Piedad Climes    Ordering User: Regis Bill   Rx printed & signed by provider; pt's spouse informed Rx will be ready for p/u tomorrow morning after 7am, understood & agreed/SLS

## 2013-08-13 ENCOUNTER — Other Ambulatory Visit: Payer: Self-pay | Admitting: Physician Assistant

## 2013-08-14 NOTE — Telephone Encounter (Signed)
Refill sent per [facility] refill protocol and VO by provider/SLS

## 2013-08-15 ENCOUNTER — Ambulatory Visit: Payer: No Typology Code available for payment source | Admitting: Rehabilitation

## 2013-08-15 DIAGNOSIS — IMO0001 Reserved for inherently not codable concepts without codable children: Secondary | ICD-10-CM | POA: Diagnosis not present

## 2013-08-16 ENCOUNTER — Telehealth: Payer: Self-pay | Admitting: Physician Assistant

## 2013-08-16 DIAGNOSIS — G4700 Insomnia, unspecified: Secondary | ICD-10-CM

## 2013-08-16 MED ORDER — ZOLPIDEM TARTRATE ER 12.5 MG PO TBCR: 12.5000 mg | EXTENDED_RELEASE_TABLET | Freq: Every day | ORAL | Status: DC

## 2013-08-16 NOTE — Telephone Encounter (Signed)
Rx request faxed to pharmacy/SLS  

## 2013-08-16 NOTE — Telephone Encounter (Signed)
Refill-zolpidem  [facility]

## 2013-08-16 NOTE — Telephone Encounter (Signed)
Medication refill granted.  Rx printed, signed and given to CMA for faxing.

## 2013-08-19 ENCOUNTER — Ambulatory Visit: Payer: No Typology Code available for payment source | Admitting: Rehabilitation

## 2013-08-19 DIAGNOSIS — IMO0001 Reserved for inherently not codable concepts without codable children: Secondary | ICD-10-CM | POA: Diagnosis not present

## 2013-08-22 ENCOUNTER — Ambulatory Visit: Payer: No Typology Code available for payment source | Admitting: Rehabilitation

## 2013-08-22 DIAGNOSIS — IMO0001 Reserved for inherently not codable concepts without codable children: Secondary | ICD-10-CM | POA: Diagnosis not present

## 2013-08-26 ENCOUNTER — Ambulatory Visit: Payer: No Typology Code available for payment source | Admitting: Rehabilitation

## 2013-08-26 DIAGNOSIS — IMO0001 Reserved for inherently not codable concepts without codable children: Secondary | ICD-10-CM | POA: Diagnosis not present

## 2013-08-29 ENCOUNTER — Ambulatory Visit: Payer: No Typology Code available for payment source | Admitting: Rehabilitation

## 2013-08-29 DIAGNOSIS — IMO0001 Reserved for inherently not codable concepts without codable children: Secondary | ICD-10-CM | POA: Diagnosis not present

## 2013-09-02 ENCOUNTER — Ambulatory Visit: Payer: No Typology Code available for payment source | Admitting: Rehabilitation

## 2013-09-02 DIAGNOSIS — IMO0001 Reserved for inherently not codable concepts without codable children: Secondary | ICD-10-CM | POA: Diagnosis not present

## 2013-09-05 ENCOUNTER — Ambulatory Visit: Payer: No Typology Code available for payment source | Admitting: Rehabilitation

## 2013-09-06 ENCOUNTER — Telehealth: Payer: Self-pay | Admitting: *Deleted

## 2013-09-06 DIAGNOSIS — G8929 Other chronic pain: Secondary | ICD-10-CM

## 2013-09-06 MED ORDER — BUPRENORPHINE 10 MCG/HR TD PTWK: MEDICATED_PATCH | TRANSDERMAL | Status: DC

## 2013-09-06 NOTE — Telephone Encounter (Signed)
Patient's spouse called to request refill on Butrans patch; he will need them on Tues & she would like to p/u Rx on Tuesday while pt is having PT/SLS Please Advise on refills.

## 2013-09-06 NOTE — Telephone Encounter (Signed)
Mediation refilled.  Ready for pick up.

## 2013-09-06 NOTE — Telephone Encounter (Signed)
Logged Rx into Controlled Substance Log; LMOM with contact name and number for return call RE: Rx ready for p/u on Monday after 7am/SLS

## 2013-09-09 ENCOUNTER — Ambulatory Visit: Payer: No Typology Code available for payment source | Admitting: Rehabilitation

## 2013-09-09 DIAGNOSIS — IMO0001 Reserved for inherently not codable concepts without codable children: Secondary | ICD-10-CM | POA: Diagnosis not present

## 2013-09-11 ENCOUNTER — Ambulatory Visit: Payer: No Typology Code available for payment source | Admitting: Rehabilitation

## 2013-09-12 ENCOUNTER — Ambulatory Visit: Payer: No Typology Code available for payment source | Attending: Physician Assistant | Admitting: Rehabilitation

## 2013-09-12 DIAGNOSIS — M6281 Muscle weakness (generalized): Secondary | ICD-10-CM | POA: Insufficient documentation

## 2013-09-12 DIAGNOSIS — IMO0001 Reserved for inherently not codable concepts without codable children: Secondary | ICD-10-CM | POA: Insufficient documentation

## 2013-09-12 DIAGNOSIS — M25519 Pain in unspecified shoulder: Secondary | ICD-10-CM | POA: Insufficient documentation

## 2013-09-12 DIAGNOSIS — M542 Cervicalgia: Secondary | ICD-10-CM | POA: Insufficient documentation

## 2013-09-12 DIAGNOSIS — R5381 Other malaise: Secondary | ICD-10-CM | POA: Diagnosis not present

## 2013-09-12 DIAGNOSIS — R293 Abnormal posture: Secondary | ICD-10-CM | POA: Diagnosis not present

## 2013-09-17 ENCOUNTER — Other Ambulatory Visit: Payer: Self-pay | Admitting: Physician Assistant

## 2013-09-17 ENCOUNTER — Ambulatory Visit: Payer: No Typology Code available for payment source | Admitting: Rehabilitation

## 2013-09-17 DIAGNOSIS — G4700 Insomnia, unspecified: Secondary | ICD-10-CM

## 2013-09-17 DIAGNOSIS — IMO0001 Reserved for inherently not codable concepts without codable children: Secondary | ICD-10-CM | POA: Diagnosis not present

## 2013-09-17 MED ORDER — ZOLPIDEM TARTRATE ER 12.5 MG PO TBCR: 12.5000 mg | EXTENDED_RELEASE_TABLET | Freq: Every day | ORAL | Status: DC

## 2013-09-17 NOTE — Telephone Encounter (Signed)
Refill-zolpidem  [facility]

## 2013-09-17 NOTE — Telephone Encounter (Signed)
OK to send 30 tabs zero refills.  

## 2013-09-17 NOTE — Telephone Encounter (Signed)
Please advise RX refill? Last RX was wrote on 08-16-13 quantity 30 with 0 refills

## 2013-09-19 ENCOUNTER — Ambulatory Visit: Payer: No Typology Code available for payment source | Admitting: Physical Therapy

## 2013-09-23 ENCOUNTER — Ambulatory Visit: Payer: No Typology Code available for payment source | Admitting: Physical Therapy

## 2013-09-23 DIAGNOSIS — IMO0001 Reserved for inherently not codable concepts without codable children: Secondary | ICD-10-CM | POA: Diagnosis not present

## 2013-09-26 ENCOUNTER — Ambulatory Visit: Payer: No Typology Code available for payment source | Admitting: Rehabilitation

## 2013-09-26 ENCOUNTER — Telehealth: Payer: Self-pay

## 2013-09-26 ENCOUNTER — Telehealth: Payer: Self-pay | Admitting: Physician Assistant

## 2013-09-26 DIAGNOSIS — E271 Primary adrenocortical insufficiency: Secondary | ICD-10-CM

## 2013-09-26 DIAGNOSIS — E109 Type 1 diabetes mellitus without complications: Secondary | ICD-10-CM

## 2013-09-26 DIAGNOSIS — I951 Orthostatic hypotension: Secondary | ICD-10-CM

## 2013-09-26 MED ORDER — FLUDROCORTISONE ACETATE 0.1 MG PO TABS: ORAL_TABLET | ORAL | Status: DC

## 2013-09-26 NOTE — Telephone Encounter (Signed)
Refill granted 

## 2013-09-26 NOTE — Telephone Encounter (Signed)
Refill-fludrocortisone  [facility]

## 2013-09-26 NOTE — Telephone Encounter (Signed)
eScribe request from [facility] for refill on Florinef 0.1 mg BID Last filled - 05.04.15, #60x0 Last AEX - 04.01.15 Next AEX - Not stated AVS Please Advise on refills/SLS

## 2013-09-26 NOTE — Telephone Encounter (Signed)
Diabetic bundle:  Left a detailed message for patient to come in at his earliest convenience to get A1C checked.  Lab order placed

## 2013-09-27 LAB — HEMOGLOBIN A1C
Hgb A1c MFr Bld: 8.2 % — ABNORMAL HIGH (ref <5.7)
Mean Plasma Glucose: 189 mg/dL — ABNORMAL HIGH (ref <117)

## 2013-10-01 ENCOUNTER — Telehealth: Payer: Self-pay

## 2013-10-01 NOTE — Telephone Encounter (Signed)
Message copied by Basilia JumboALLEN, Ryli Standlee on Tue Oct 01, 2013 10:02 AM ------      Message from: Marcelline MatesMARTIN, WILLIAM      Created: Sun Sep 29, 2013  3:25 PM       A1c above goal at 8.2.  Please verify that the patient is taking insulin as directed.  Should be checking sugars 3 times per day. [facility].  Can we verify he is still seeing specialist.  If not,  I would like for him to come in for a visit and bring a log of his blood sugar recordings with him.  We will need to get his diabetes under better control. ------

## 2013-10-01 NOTE — Telephone Encounter (Signed)
Pt was contacted VM was left to contact office for most recent lab results

## 2013-10-03 ENCOUNTER — Encounter: Payer: Self-pay | Admitting: Pulmonary Disease

## 2013-10-03 ENCOUNTER — Ambulatory Visit (INDEPENDENT_AMBULATORY_CARE_PROVIDER_SITE_OTHER): Payer: No Typology Code available for payment source | Admitting: Pulmonary Disease

## 2013-10-03 VITALS — BP 160/90 | HR 78 | Temp 97.1°F | Ht 66.0 in | Wt 179.0 lb

## 2013-10-03 DIAGNOSIS — G4733 Obstructive sleep apnea (adult) (pediatric): Secondary | ICD-10-CM

## 2013-10-03 DIAGNOSIS — Z9989 Dependence on other enabling machines and devices: Principal | ICD-10-CM

## 2013-10-03 NOTE — Progress Notes (Signed)
Chief Complaint  Patient presents with  . Follow-up    Pt reports better sleep but still not fully rested in the mornings. Pt reports some breakthru sleepniess during daytime. Denies pressure or mask issues.     History of Present Illness: Timothy Alvarez is a 58 y.o. male with OSA.  He has nasal mask, and this fits well.  He occasionally opens his mouth at night.  He is sleeping better, and feeling better.  He still naps for 1 to 2 hours >> not using CPAP then.  He still has some daytime sleepiness.  TESTS: PSG 06/11/13 >> AHI 19, SaO2 low 77%   Past medical hx, Past surgical hx, Medications, Allergies, Family hx, Social hx all reviewed.   Physical Exam:  General - No distress ENT - No sinus tenderness, no oral exudate, no LAN, MP 3, scalloped tongue, high arched palate, decreased AP diameter Cardiac - s1s2 regular, no murmur Chest - No wheeze/rales/dullness Back - No focal tenderness Abd - Soft, non-tender Ext - No edema Neuro - Normal strength Skin - No rashes Psych - normal mood, and behavior   Assessment/Plan:  Timothy HellingVineet Caster Fayette, MD [facility] Pager:  [phone]

## 2013-10-03 NOTE — Patient Instructions (Signed)
Will get CPAP report and call with results Follow up in 1 year 

## 2013-10-03 NOTE — Assessment & Plan Note (Signed)
He is compliant with CPAP and reports benefit.  Advised him to use CPAP with naps.  Will get his download and call him with results.

## 2013-10-04 ENCOUNTER — Telehealth: Payer: Self-pay | Admitting: Physician Assistant

## 2013-10-04 DIAGNOSIS — G8929 Other chronic pain: Secondary | ICD-10-CM

## 2013-10-04 MED ORDER — BUPRENORPHINE 10 MCG/HR TD PTWK: MEDICATED_PATCH | TRANSDERMAL | Status: DC

## 2013-10-04 NOTE — Telephone Encounter (Signed)
Patient left message requesting refill on Burtrans patches be called in to [facility]

## 2013-10-04 NOTE — Telephone Encounter (Signed)
Ok to refill.  Rx signed and placed at front desk.  Please call patient to inform he can pick up Rx.

## 2013-10-04 NOTE — Telephone Encounter (Signed)
Do you approve of refill? Please advise

## 2013-10-04 NOTE — Telephone Encounter (Signed)
Pt.notified

## 2013-10-08 ENCOUNTER — Telehealth: Payer: Self-pay | Admitting: Pulmonary Disease

## 2013-10-08 NOTE — Telephone Encounter (Signed)
Auto CPAP 09/05/13 to 10/04/13 >> used on 29 of 30 nights with average 3 hrs and 49 min.  Average AHI is 3.7 with median CPAP 9 cm H2O and 95 th percentile CPAP 12 cm H20.  Will have my nurse inform pt that CPAP report shows good control of sleep apnea.  He needs to use CPAP for entire time he is asleep to get maximal benefit.

## 2013-10-09 NOTE — Telephone Encounter (Signed)
lmtcb x1 

## 2013-10-10 ENCOUNTER — Ambulatory Visit (INDEPENDENT_AMBULATORY_CARE_PROVIDER_SITE_OTHER): Payer: No Typology Code available for payment source | Admitting: Physician Assistant

## 2013-10-10 ENCOUNTER — Encounter: Payer: Self-pay | Admitting: Physician Assistant

## 2013-10-10 VITALS — BP 138/78 | HR 79 | Temp 98.1°F | Resp 16 | Ht 66.0 in | Wt 178.8 lb

## 2013-10-10 DIAGNOSIS — Z23 Encounter for immunization: Secondary | ICD-10-CM

## 2013-10-10 DIAGNOSIS — G894 Chronic pain syndrome: Secondary | ICD-10-CM

## 2013-10-10 NOTE — Progress Notes (Signed)
Patient presents to clinic today for follow-up and medication management.  Patient is still doing extremely well with his Butrans patch.   Is able to exercise without significant pain.  Is enjoying more outdoor activities with his family.  Recent labs reveal no increase in liver enzymes while on medication.  Patient continues to be followed by Endocrinology for his DM I.   Past Medical History  Diagnosis Date  . Diabetes type 1, uncontrolled   . Frequent headaches   . Heart attack 09.13.2014    x5  . Hyperlipidemia   . Chicken pox   . Mumps   . Measles, [place] (rubella)   . Scarlet fever   . Diabetic retinopathy   . Cataracts, bilateral   . Orthostatic hypotension   . CAD (coronary artery disease)   . Contrast dye induced nephropathy   . OSA (obstructive sleep apnea) 07/17/2013    Current Outpatient Prescriptions on File Prior to Visit  Medication Sig Dispense Refill  . aspirin 81 MG tablet Take 81 mg by mouth daily.      . buprenorphine (BUTRANS) 10 MCG/HR PTWK patch PLACE 1 PATCH ONTO THE SKIN ONCE A WEEK  4 patch  0  . carvedilol (COREG) 12.5 MG tablet Take 6.25 mg by mouth 2 (two) times daily with a meal.      . clopidogrel (PLAVIX) 75 MG tablet Take 75 mg by mouth daily with breakfast.      . co-enzyme Q-10 30 MG capsule Take 30 mg by mouth daily.       . DOK PLUS 50-8.6 MG per tablet TAKE 1 TABLET (8.6 MG TOTAL) BY MOUTH DAILY.  30 tablet  2  . DULoxetine (CYMBALTA) 60 MG capsule TAKE 1 CAPSULE BY MOUTH DAILY.  30 capsule  2  . fludrocortisone (FLORINEF) 0.1 MG tablet TAKE 1 TABLET BY MOUTH TWICE DAILY  60 tablet  0  . gabapentin (NEURONTIN) 600 MG tablet 624m AM, 3030mwith lunch, 60069mM      . glucagon 1 MG injection Inject 1 mg into the vein once as needed (Emergency Test Kit).      . gMarland Kitchenucose blood test strip 1 each by Other route as directed. Use as instructed FREESTYLE IN VITRO      . insulin lispro (HUMALOG) 100 UNIT/ML injection Inject into the skin as directed. Use in  Insulin Pump, Max daily dose: 100 units/day      . L-Methylfolate-Algae-B12-B6 (METANX) 3-90.314-2-35 MG CAPS Take 1 capsule by mouth 2 (two) times daily.      . midodrine (PROAMATINE) 10 MG tablet TAKE 1 TABLET (10 MG TOTAL) BY MOUTH 2 (TWO) TIMES DAILY.  60 tablet  2  . rosuvastatin (CRESTOR) 10 MG tablet Take 10 mg by mouth daily.      . zMarland Kitchenlpidem (AMBIEN CR) 12.5 MG CR tablet Take 1 tablet (12.5 mg total) by mouth at bedtime.  30 tablet  0   No current facility-administered medications on file prior to visit.    No Known Allergies  Family History  Problem Relation Age of Onset  . Vascular Disease Father 96 24 Deceased  . Transient ischemic attack Father   . Hypertension Mother 93 74 Deceased  . Liver cancer Paternal Grandfather   . Breast cancer Sister   . Heart disease Sister     Ablation for rhythm disturbance  . Healthy Brother     x1  . Other Mother     s/p PPM  History   Social History  . Marital Status: Married    Spouse Name: N/A    Number of Children: 1  . Years of Education: N/A   Social History Main Topics  . Smoking status: Never Smoker   . Smokeless tobacco: None  . Alcohol Use: Yes     Comment: Rare  . Drug Use: No  . Sexual Activity: None   Other Topics Concern  . 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  They have one grown daughter.   Highest level of education:  Master's Degree   Review of Systems - See HPI.  All other ROS are negative.  BP 138/78  Pulse 79  Temp(Src) 98.1 F (36.7 C) (Oral)  Resp 16  Ht 5' 6"  (1.676 m)  Wt 178 lb 12 oz (81.08 kg)  BMI 28.86 kg/m2  SpO2 97%  Physical Exam  Vitals reviewed. Constitutional: He is oriented to person, place, and time and well-developed, well-nourished, and in no distress.  HENT:  Head: Normocephalic and atraumatic.  Eyes: Conjunctivae are normal.  Cardiovascular: Normal rate, regular rhythm, normal heart sounds and intact  distal pulses.   Pulmonary/Chest: Effort normal and breath sounds normal. No respiratory distress. He has no wheezes. He has no rales. He exhibits no tenderness.  Neurological: He is alert and oriented to person, place, and time.  Skin: Skin is warm and dry. No rash noted.  Psychiatric: Affect normal.    Recent Results (from the past 2160 hour(s))  COMPREHENSIVE METABOLIC PANEL     Status: Abnormal   Collection Time    07/15/13  2:36 PM      Result Value Ref Range   Sodium 143  135 - 145 mEq/L   Potassium 4.2  3.5 - 5.3 mEq/L   Chloride 103  96 - 112 mEq/L   CO2 27  19 - 32 mEq/L   Glucose, Bld 119 (*) 70 - 99 mg/dL   BUN 20  6 - 23 mg/dL   Creat 1.20  0.50 - 1.35 mg/dL   Total Bilirubin 0.3  0.2 - 1.2 mg/dL   Alkaline Phosphatase 106  39 - 117 U/L   AST 20  0 - 37 U/L   ALT 25  0 - 53 U/L   Total Protein 6.3  6.0 - 8.3 g/dL   Albumin 3.9  3.5 - 5.2 g/dL   Calcium 9.1  8.4 - 10.5 mg/dL  VITAMIN D 1,25 DIHYDROXY     Status: None   Collection Time    07/15/13  2:36 PM      Result Value Ref Range   Vitamin D 1, 25 (OH)2 Total 48  18 - 72 pg/mL   Vitamin D3 1, 25 (OH)2 48     Vitamin D2 1, 25 (OH)2 <8     Comment: Vitamin D3, 1,25(OH)2 indicates both endogenous     production and supplementation.  Vitamin D2, 1,25(OH)2     is an indicator of exogeous sources, such as diet or     supplementation.  Interpretation and therapy are based     on measurement of Vitamin D,1,25(OH)2, Total.     This test was developed and its performance     characteristics have been determined by [facility], [place], [place].     Performance characteristics refer to the     analytical performance of the test.  HEMOGLOBIN  A1C     Status: Abnormal   Collection Time    09/26/13  9:31 AM      Result Value Ref Range   Hemoglobin A1C 8.2 (*) <5.7 %   Comment:                                                                            According to the ADA Clinical Practice  Recommendations for 2011, when     HbA1c is used as a screening test:             >=6.5%   Diagnostic of Diabetes Mellitus                (if abnormal result is confirmed)           5.7-6.4%   Increased risk of developing Diabetes Mellitus           References:Diagnosis and Classification of Diabetes Mellitus,Diabetes     WSFK,8127,51(ZGYFV 1):S62-S69 and Standards of Medical Care in             Diabetes - 2011,Diabetes Care,2011,34 (Suppl 1):S11-S61.         Mean Plasma Glucose 189 (*) <117 mg/dL    Assessment/Plan: Chronic pain syndrome Doing very well.  Continue current regimen.  Need for prophylactic vaccination with combined diphtheria-tetanus-pertussis (DTP) vaccine Immunization given by nursing staff.

## 2013-10-10 NOTE — Progress Notes (Signed)
Pre visit review using our clinic review tool, if applicable. No additional management support is needed unless otherwise documented below in the visit note/SLS  

## 2013-10-10 NOTE — Patient Instructions (Signed)
Please continue medications as directed.  Follow-up with Cardiology and Endocrinology as directed.  Follow-up in 3 months.  Return sooner if needed.  I am glad you are doing well.

## 2013-10-11 DIAGNOSIS — Z23 Encounter for immunization: Secondary | ICD-10-CM | POA: Insufficient documentation

## 2013-10-11 NOTE — Assessment & Plan Note (Signed)
Doing very well. Continue current regimen.  

## 2013-10-11 NOTE — Telephone Encounter (Signed)
lmtcb x2 

## 2013-10-11 NOTE — Assessment & Plan Note (Signed)
Immunization given by nursing staff. 

## 2013-10-15 NOTE — Telephone Encounter (Signed)
lmtcb x 3 Will try one more time then send letter to contact our office

## 2013-10-16 ENCOUNTER — Other Ambulatory Visit: Payer: Self-pay | Admitting: Family

## 2013-10-16 NOTE — Telephone Encounter (Signed)
Rx request faxed to pharmacy; pt informed/SLS

## 2013-10-16 NOTE — Telephone Encounter (Signed)
Ok to fax refill to pharmacy.  Same quantity and strength.  Rx printed and signed.

## 2013-10-16 NOTE — Telephone Encounter (Signed)
eScribe request from [facility] for refill on Zolpidem Last filled - 07.07.15, #30x0 Last AEX - 07.30.15 Next AEX - 3-Mths. Please Advise on refills/SLS

## 2013-10-16 NOTE — Telephone Encounter (Signed)
lmtcb x 4 Letter mailed Will close message.

## 2013-10-18 ENCOUNTER — Other Ambulatory Visit: Payer: Self-pay | Admitting: Physician Assistant

## 2013-10-18 NOTE — Telephone Encounter (Signed)
Rx request to pharmacy/SLS  

## 2013-10-24 ENCOUNTER — Telehealth: Payer: Self-pay | Admitting: Pulmonary Disease

## 2013-10-24 NOTE — Telephone Encounter (Signed)
Timothy HellingVineet Sood, MD at 10/08/2013 11:52 AM     Status: Signed        Auto CPAP 09/05/13 to 10/04/13 >> used on 29 of 30 nights with average 3 hrs and 49 min. Average AHI is 3.7 with median CPAP 9 cm H2O and 95 th percentile CPAP 12 cm H20.  Will have my nurse inform pt that CPAP report shows good control of sleep apnea. He needs to use CPAP for entire time he is asleep to get maximal ben  ---  I spoke with patient about results and he verbalized understanding and had no questions.

## 2013-10-28 ENCOUNTER — Other Ambulatory Visit: Payer: Self-pay | Admitting: Physician Assistant

## 2013-10-28 NOTE — Telephone Encounter (Signed)
eScribe request from [facility] for refill on Butrans Last filled - 07.24.15, #4 Last AEX - 07.30.15 Next AEX - 3-mths. Will Hold Until Rx is due/SLS

## 2013-10-31 ENCOUNTER — Other Ambulatory Visit: Payer: Self-pay | Admitting: Family

## 2013-10-31 ENCOUNTER — Telehealth: Payer: Self-pay

## 2013-10-31 NOTE — Telephone Encounter (Signed)
Pt stated that " he need refill on  Pain patch."LDM

## 2013-10-31 NOTE — Telephone Encounter (Signed)
Holding Rx under [employer] until due for refill/SLS

## 2013-11-01 NOTE — Telephone Encounter (Signed)
Medication refilled.  Rx printed, signed and up at front desk for pickup.

## 2013-11-01 NOTE — Telephone Encounter (Deleted)
eScribe request from [facility] for refill on Butrans Last filled - 07.24.15, #4x0 Last AEX - *** Next AEX - ***

## 2013-11-01 NOTE — Telephone Encounter (Signed)
Done by provider 08.21.15/SLS

## 2013-11-13 ENCOUNTER — Other Ambulatory Visit: Payer: Self-pay | Admitting: Physician Assistant

## 2013-11-19 ENCOUNTER — Telehealth: Payer: Self-pay | Admitting: Physician Assistant

## 2013-11-19 MED ORDER — ZOLPIDEM TARTRATE ER 12.5 MG PO TBCR: EXTENDED_RELEASE_TABLET | ORAL | Status: DC

## 2013-11-19 NOTE — Telephone Encounter (Signed)
zolpidem (AMBIEN CR) 12.5 MG CR tablet  Last OV- 10/10/13  Last refilled- 10/16/13 #30 / 0 rf

## 2013-11-19 NOTE — Telephone Encounter (Signed)
Taken care of.  Rx faxed to pharmacy.

## 2013-11-19 NOTE — Telephone Encounter (Signed)
Caller name: Jaryn  Call back [phone] Pharmacy:  Reason for call:  Pt is wanting a refill on Rx zolpidem (AMBIEN CR) 12.5 MG CR tablet ,  We did receive a request from Pharmacy on 9/2.

## 2013-11-20 ENCOUNTER — Encounter: Payer: Self-pay | Admitting: Cardiology

## 2013-11-20 ENCOUNTER — Ambulatory Visit (INDEPENDENT_AMBULATORY_CARE_PROVIDER_SITE_OTHER): Payer: No Typology Code available for payment source | Admitting: Cardiology

## 2013-11-20 VITALS — BP 140/80 | HR 68 | Ht 66.0 in | Wt 181.3 lb

## 2013-11-20 DIAGNOSIS — N1411 Contrast-induced nephropathy: Secondary | ICD-10-CM

## 2013-11-20 DIAGNOSIS — T508X5A Adverse effect of diagnostic agents, initial encounter: Secondary | ICD-10-CM

## 2013-11-20 DIAGNOSIS — I951 Orthostatic hypotension: Secondary | ICD-10-CM

## 2013-11-20 DIAGNOSIS — E785 Hyperlipidemia, unspecified: Secondary | ICD-10-CM

## 2013-11-20 DIAGNOSIS — E1029 Type 1 diabetes mellitus with other diabetic kidney complication: Secondary | ICD-10-CM

## 2013-11-20 DIAGNOSIS — R0989 Other specified symptoms and signs involving the circulatory and respiratory systems: Secondary | ICD-10-CM

## 2013-11-20 DIAGNOSIS — E1022 Type 1 diabetes mellitus with diabetic chronic kidney disease: Secondary | ICD-10-CM

## 2013-11-20 DIAGNOSIS — R0609 Other forms of dyspnea: Secondary | ICD-10-CM

## 2013-11-20 DIAGNOSIS — N189 Chronic kidney disease, unspecified: Secondary | ICD-10-CM

## 2013-11-20 DIAGNOSIS — G4733 Obstructive sleep apnea (adult) (pediatric): Secondary | ICD-10-CM

## 2013-11-20 DIAGNOSIS — N141 Nephropathy induced by other drugs, medicaments and biological substances: Secondary | ICD-10-CM

## 2013-11-20 DIAGNOSIS — I2510 Atherosclerotic heart disease of native coronary artery without angina pectoris: Secondary | ICD-10-CM

## 2013-11-20 DIAGNOSIS — N170 Acute kidney failure with tubular necrosis: Secondary | ICD-10-CM

## 2013-11-20 MED ORDER — ISOSORBIDE MONONITRATE ER 30 MG PO TB24
30.0000 mg | ORAL_TABLET | Freq: Every day | ORAL | Status: DC
Start: 2013-11-20 — End: 2014-02-27

## 2013-11-20 NOTE — Progress Notes (Signed)
11/24/2013   PCP: Leeanne Rio, PA-C   Chief Complaint  Patient presents with  . 6 mo rov    complains of DOE. symptoms started about 1 month ago.    Primary Cardiologist: Dr. Thresa Ross   HPI:  58 yo male for evaluation of CAD. Patient previously lived in [place]. He had his first myocardial infarction in 2010. He has had subsequent events. He moved here in May of 2014. This past fall he was admitted to [facility] with a myocardial infarction. He had PCI. Procedure was complicated by contrast nephropathy requiring transient dialysis.  Since then he does have some fatigue. He has dyspnea with more extreme activities. No orthopnea, PND, pedal edema, claudication or chest pain. Note his symptoms at the time of his previous infarct were severe nausea and diaphoresis.  Last seen by Dr. Stanford Breed 05/08/13  Today he does complain of DOE that began 1 month ago.  He could walk a 1/3 mile without stopping now he can only work half of that before he has to stop and rest.    His last cath 11/26/12 with stent to mid RCA, with Resolute DES, LAD with mid 90% stenosis, LAD was small caliber, not suitable for PCI or grafting.  EF 60%   Note his symptoms at the time of his previous infarct were severe nausea and diaphoresis.   No Known Allergies  Current Outpatient Prescriptions  Medication Sig Dispense Refill  . aspirin 81 MG tablet Take 81 mg by mouth daily.      . carvedilol (COREG) 12.5 MG tablet Take 6.25 mg by mouth 2 (two) times daily with a meal.      . clopidogrel (PLAVIX) 75 MG tablet Take 75 mg by mouth daily with breakfast.      . co-enzyme Q-10 30 MG capsule Take 30 mg by mouth daily.       . DOK PLUS 50-8.6 MG per tablet TAKE 1 TABLET BY MOUTH DAILY  100 tablet  0  . DULoxetine (CYMBALTA) 60 MG capsule TAKE 1 CAPSULE BY MOUTH DAILY.  30 capsule  1  . fludrocortisone (FLORINEF) 0.1 MG tablet TAKE 1 TABLET BY MOUTH TWICE DAILY  60 tablet  2  . gabapentin  (NEURONTIN) 600 MG tablet 656m AM, 3046mwith lunch, 60077mM      . glucagon 1 MG injection Inject 1 mg into the vein once as needed (Emergency Test Kit).      . gMarland Kitchenucose blood test strip 1 each by Other route as directed. Use as instructed FREESTYLE IN VITRO      . insulin lispro (HUMALOG) 100 UNIT/ML injection Inject into the skin as directed. Use in Insulin Pump, Max daily dose: 100 units/day      . L-Methylfolate-Algae-B12-B6 (METANX) 3-90.314-2-35 MG CAPS Take 1 capsule by mouth 2 (two) times daily.      . midodrine (PROAMATINE) 10 MG tablet TAKE 1 TABLET BY MOUTH TWICE DAILY  60 tablet  2  . rosuvastatin (CRESTOR) 10 MG tablet Take 10 mg by mouth daily.      . zMarland Kitchenlpidem (AMBIEN CR) 12.5 MG CR tablet TAKE 1 TABLET BY MOUTH AT BEDTIME  30 tablet  2  . isosorbide mononitrate (IMDUR) 30 MG 24 hr tablet Take 1 tablet (30 mg total) by mouth daily.  30 tablet  6   No current facility-administered medications for this visit.    Past Medical History  Diagnosis Date  .  Diabetes type 1, uncontrolled   . Frequent headaches   . Heart attack 09.13.2014    x5  . Hyperlipidemia   . Chicken pox   . Mumps   . Measles, [place] (rubella)   . Scarlet fever   . Diabetic retinopathy   . Cataracts, bilateral   . Orthostatic hypotension   . CAD (coronary artery disease)   . Contrast dye induced nephropathy   . OSA (obstructive sleep apnea) 07/17/2013    Past Surgical History  Procedure Laterality Date  . Coronary angioplasty with stent placement    . Appendectomy  1966  . Wisdom tooth extraction    . Refractive surgery      Retinopathy    LMR:AJHHIDU:PB colds or fevers, mild increase in weight Skin:no rashes or ulcers HEENT:no blurred vision, no congestion CV:see HPI PUL:see HPI GI:no diarrhea constipation or melena, no indigestion GU:no hematuria, no dysuria MS:no joint pain, no claudication Neuro:no syncope, no lightheadedness Endo:+ diabetes- insulin pump, no thyroid disease  Wt  Readings from Last 3 Encounters:  11/22/13 178 lb (80.74 kg)  11/20/13 181 lb 4.8 oz (82.237 kg)  10/10/13 178 lb 12 oz (81.08 kg)    PHYSICAL EXAM BP 140/80  Pulse 68  Ht 5' 6"  (1.676 m)  Wt 181 lb 4.8 oz (82.237 kg)  BMI 29.28 kg/m2 General:Pleasant affect, NAD Skin:Warm and dry, brisk capillary refill HEENT:normocephalic, sclera clear, mucus membranes moist Neck:supple, no JVD, no bruits  Heart:S1S2 RRR without murmur, gallup, rub or click Lungs:clear without rales, rhonchi, or wheezes DHD:IXBO, non tender, + BS, do not palpate liver spleen or masses Ext:no lower ext edema, 2+ pedal pulses, 2+ radial pulses Neuro:alert and oriented, MAE, follows commands, + facial symmetry ERQ:SXQKSK of 68 no acute changes from previous  ASSESSMENT AND PLAN DOE (dyspnea on exertion) Over last month increasing DOE relieved with rest.  Discussed with Dr. Adele Schilder now plan to add imdur 30 mg daily.  May be the LAD causing problems. He will follow up with Dr. Thresa Ross in  4-6 weeks though if the symptoms increase he will call.  CAD (coronary artery disease) With mRCA stent in 11/2012.  See above.  Contrast dye induced nephropathy Needing dialysis at last cath.  Diabetes mellitus type 1 Followed by PCP  OSA (obstructive sleep apnea) Uses CPAP  Orthostatic hypotension Treated, but with DOE as his angina imdur added monitor for further BP issues  Other and unspecified hyperlipidemia Lipid Panel     Component Value Date/Time   CHOL 139 05/08/2013 1143   TRIG 79 05/08/2013 1143   HDL 57 05/08/2013 1143   CHOLHDL 2.4 05/08/2013 1143   VLDL 16 05/08/2013 1143   LDLCALC 66 05/08/2013 1143   On crestor

## 2013-11-20 NOTE — Patient Instructions (Signed)
Your physician has recommended you make the following change in your medication: start new prescription for isosorbide 30 mg this has already been sent to your pharmacy.  Your physician recommends that you schedule a follow-up appointment in: 4-6 weeks with dr. Jens Som in this office or [facility] office.

## 2013-11-22 ENCOUNTER — Encounter: Payer: Self-pay | Admitting: Physician Assistant

## 2013-11-22 ENCOUNTER — Ambulatory Visit (INDEPENDENT_AMBULATORY_CARE_PROVIDER_SITE_OTHER): Payer: No Typology Code available for payment source | Admitting: Physician Assistant

## 2013-11-22 VITALS — BP 130/77 | HR 74 | Temp 97.9°F | Wt 178.0 lb

## 2013-11-22 DIAGNOSIS — G894 Chronic pain syndrome: Secondary | ICD-10-CM

## 2013-11-22 NOTE — Progress Notes (Signed)
Pre visit review using our clinic review tool, if applicable. No additional management support is needed unless otherwise documented below in the visit note. 

## 2013-11-22 NOTE — Patient Instructions (Signed)
Lets try to wean you off of the butrans.  Since you have been off for a week and feeling fine, I recommend you stay off of the patch.  Tylenol as needed for pain.  If you notice your pain start to return, please let me know.  We can find alternatives for your pain.  For sleep, get some melatonin at the pharmacy and begin taking as directed.  A glass of warm milk before bed may also ease you to sleep due to the tryptophan that is released from the warm milk.  Follow-up with me in October as scheduled.  Return sooner if needed.

## 2013-11-24 DIAGNOSIS — R0609 Other forms of dyspnea: Secondary | ICD-10-CM | POA: Insufficient documentation

## 2013-11-24 NOTE — Assessment & Plan Note (Signed)
Treated, but with DOE as his angina imdur added monitor for further BP issues

## 2013-11-24 NOTE — Assessment & Plan Note (Addendum)
Over last month increasing DOE relieved with rest.  Discussed with Dr. Douglass Rivers now plan to add imdur 30 mg daily.  May be the LAD causing problems. He will follow up with Dr. Velta Addison in  4-6 weeks though if the symptoms increase he will call.

## 2013-11-24 NOTE — Assessment & Plan Note (Signed)
Lipid Panel     Component Value Date/Time   CHOL 139 05/08/2013 1143   TRIG 79 05/08/2013 1143   HDL 57 05/08/2013 1143   CHOLHDL 2.4 05/08/2013 1143   VLDL 16 05/08/2013 1143   LDLCALC 66 05/08/2013 1143   On crestor

## 2013-11-24 NOTE — Assessment & Plan Note (Signed)
Followed by PCP

## 2013-11-24 NOTE — Assessment & Plan Note (Signed)
With mRCA stent in 11/2012.  See above.

## 2013-11-24 NOTE — Assessment & Plan Note (Signed)
I feel that patient's request is admirable and should be given a fair attempt.  Patient without withdrawal symptoms 8 days out from stopping the Butrans.  Will continue staying off medication over the next month.  Will continue Cymbalta and Gabapentin.  Continue physical activity. Will readdress pain level and medication regimen at follow-up in 1 month.

## 2013-11-24 NOTE — Progress Notes (Signed)
Patient presents to clinic today for medication management.  Patient has been previously on regimen of Butrans, Cymbalta and Gabapentin for chronic neuropathic pain.  Patient has been doing very well.  States his las Butrans patch fell off and he has been without medication for 8 days.  States he still feels well and is wondering if we can attempt a trial off of the medication.  Patient continues to take other medications as directed. Denies new complaints.  Past Medical History  Diagnosis Date  . Diabetes type 1, uncontrolled   . Frequent headaches   . Heart attack 09.13.2014    x5  . Hyperlipidemia   . Chicken pox   . Mumps   . Measles, [place] (rubella)   . Scarlet fever   . Diabetic retinopathy   . Cataracts, bilateral   . Orthostatic hypotension   . CAD (coronary artery disease)   . Contrast dye induced nephropathy   . OSA (obstructive sleep apnea) 07/17/2013    Current Outpatient Prescriptions on File Prior to Visit  Medication Sig Dispense Refill  . aspirin 81 MG tablet Take 81 mg by mouth daily.      . carvedilol (COREG) 12.5 MG tablet Take 6.25 mg by mouth 2 (two) times daily with a meal.      . clopidogrel (PLAVIX) 75 MG tablet Take 75 mg by mouth daily with breakfast.      . co-enzyme Q-10 30 MG capsule Take 30 mg by mouth daily.       . DOK PLUS 50-8.6 MG per tablet TAKE 1 TABLET BY MOUTH DAILY  100 tablet  0  . DULoxetine (CYMBALTA) 60 MG capsule TAKE 1 CAPSULE BY MOUTH DAILY.  30 capsule  1  . fludrocortisone (FLORINEF) 0.1 MG tablet TAKE 1 TABLET BY MOUTH TWICE DAILY  60 tablet  2  . gabapentin (NEURONTIN) 600 MG tablet 668m AM, 3038mwith lunch, 60064mM      . glucagon 1 MG injection Inject 1 mg into the vein once as needed (Emergency Test Kit).      . gMarland Kitchenucose blood test strip 1 each by Other route as directed. Use as instructed FREESTYLE IN VITRO      . insulin lispro (HUMALOG) 100 UNIT/ML injection Inject into the skin as directed. Use in Insulin Pump, Max daily  dose: 100 units/day      . isosorbide mononitrate (IMDUR) 30 MG 24 hr tablet Take 1 tablet (30 mg total) by mouth daily.  30 tablet  6  . L-Methylfolate-Algae-B12-B6 (METANX) 3-90.314-2-35 MG CAPS Take 1 capsule by mouth 2 (two) times daily.      . midodrine (PROAMATINE) 10 MG tablet TAKE 1 TABLET BY MOUTH TWICE DAILY  60 tablet  2  . rosuvastatin (CRESTOR) 10 MG tablet Take 10 mg by mouth daily.      . zMarland Kitchenlpidem (AMBIEN CR) 12.5 MG CR tablet TAKE 1 TABLET BY MOUTH AT BEDTIME  30 tablet  2   No current facility-administered medications on file prior to visit.    No Known Allergies  Family History  Problem Relation Age of Onset  . Vascular Disease Father 96 63 Deceased  . Transient ischemic attack Father   . Hypertension Mother 93 76 Deceased  . Liver cancer Paternal Grandfather   . Breast cancer Sister   . Heart disease Sister     Ablation for rhythm disturbance  . Healthy Brother     x1  . Other Mother  s/p PPM    History   Social History  . Marital Status: Married    Spouse Name: N/A    Number of Children: 1  . Years of Education: N/A   Social History Main Topics  . Smoking status: Never Smoker   . Smokeless tobacco: None  . Alcohol Use: Yes     Comment: Rare  . Drug Use: No  . Sexual Activity: None   Other Topics Concern  . 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  They have one grown daughter.   Highest level of education:  Master's Degree   Review of Systems - See HPI.  All other ROS are negative.  BP 130/77  Pulse 74  Temp(Src) 97.9 F (36.6 C)  Wt 178 lb (80.74 kg)  SpO2 97%  Physical Exam  Vitals reviewed. Constitutional: He is oriented to person, place, and time and well-developed, well-nourished, and in no distress.  HENT:  Head: Normocephalic and atraumatic.  Eyes: Conjunctivae are normal.  Cardiovascular: Normal rate, regular rhythm, normal heart sounds and intact distal pulses.    Pulmonary/Chest: Effort normal and breath sounds normal. No respiratory distress. He has no wheezes. He has no rales. He exhibits no tenderness.  Neurological: He is alert and oriented to person, place, and time.  Skin: Skin is warm and dry. No rash noted.  Psychiatric: Affect normal.    Recent Results (from the past 2160 hour(s))  HEMOGLOBIN A1C     Status: Abnormal   Collection Time    09/26/13  9:31 AM      Result Value Ref Range   Hemoglobin A1C 8.2 (*) <5.7 %   Comment:                                                                            According to the ADA Clinical Practice Recommendations for 2011, when     HbA1c is used as a screening test:             >=6.5%   Diagnostic of Diabetes Mellitus                (if abnormal result is confirmed)           5.7-6.4%   Increased risk of developing Diabetes Mellitus           References:Diagnosis and Classification of Diabetes Mellitus,Diabetes     TMAU,6333,54(TGYBW 1):S62-S69 and Standards of Medical Care in             Diabetes - 2011,Diabetes Care,2011,34 (Suppl 1):S11-S61.         Mean Plasma Glucose 189 (*) <117 mg/dL    Assessment/Plan: Chronic pain syndrome I feel that patient's request is admirable and should be given a fair attempt.  Patient without withdrawal symptoms 8 days out from stopping the Butrans.  Will continue staying off medication over the next month.  Will continue Cymbalta and Gabapentin.  Continue physical activity. Will readdress pain level and medication regimen at follow-up in 1 month.

## 2013-11-24 NOTE — Assessment & Plan Note (Signed)
Needing dialysis at last cath.

## 2013-11-24 NOTE — Assessment & Plan Note (Signed)
Uses CPAP 

## 2013-12-12 ENCOUNTER — Telehealth: Payer: Self-pay | Admitting: Physician Assistant

## 2013-12-12 NOTE — Telephone Encounter (Signed)
Caller name: Maisie Fushomas  Relation to pt: self  Call back number:[phone] Pharmacy: [facility] [phone]   Reason for call:   patient stated you wanted him to make you aware if body pain persisted and it has patient is requesting a RX. Please advise

## 2013-12-13 ENCOUNTER — Encounter: Payer: Self-pay | Admitting: Family

## 2013-12-13 ENCOUNTER — Ambulatory Visit (INDEPENDENT_AMBULATORY_CARE_PROVIDER_SITE_OTHER): Payer: No Typology Code available for payment source | Admitting: Family

## 2013-12-13 VITALS — BP 109/65 | HR 73 | Temp 98.2°F | Resp 16 | Ht 66.0 in | Wt 179.4 lb

## 2013-12-13 DIAGNOSIS — Z23 Encounter for immunization: Secondary | ICD-10-CM

## 2013-12-13 DIAGNOSIS — G894 Chronic pain syndrome: Secondary | ICD-10-CM

## 2013-12-13 MED ORDER — PREGABALIN 50 MG PO CAPS: 50.0000 mg | ORAL_CAPSULE | Freq: Three times a day (TID) | ORAL | Status: DC

## 2013-12-13 NOTE — Patient Instructions (Signed)
Please go to lab for urine test. Start lyrica for pain.  [facility]

## 2013-12-13 NOTE — Telephone Encounter (Signed)
Acute Visit pts insurance changed. medications not covered Butran patch and ambien CR  Chronic pain syndrome - Waldon MerlWilliam C Martin, PA-C at 11/24/2013  7:50 PM      Status: Written Related Problem: Chronic pain syndrome    I feel that patient's request is admirable and should be given a fair attempt.  Patient without withdrawal symptoms 8 days out from stopping the Butrans.  Will continue staying off medication over the next month.  Will continue Cymbalta and Gabapentin.  Continue physical activity. Will readdress pain level and medication regimen at follow-up in 1 month.     Encounter-Level Documents:       Electronic signature on 11/22/2013 1:56 PM     Discontinued Medications      Reason for Discontinue      BUTRANS 10 MCG/HR PTWK patch         Patient Instructions      Lets try to wean you off of the butrans.  Since you have been off for a week and feeling fine, I recommend you stay off of the patch.  Tylenol as needed for pain.  If you notice your pain start to return, please let me know.  We can find alternatives for your pain.  For sleep, get some melatonin at the pharmacy and begin taking as directed.  A glass of warm milk before bed may also ease you to sleep due to the tryptophan that is released from the warm milk.  Follow-up with me in October as scheduled.  Return sooner if needed.  Spoke with Sandford CrazeMelissa O'Sullivan regarding patient situation and she agreed to see patient today at 2:00p to work out Rx for pain until PCP [Cody Martin] returns to office on 10.09.15; pt scheduled, understood & agreed/SLS

## 2013-12-13 NOTE — Progress Notes (Signed)
Pre visit review using our clinic review tool, if applicable. No additional management support is needed unless otherwise documented below in the visit note. 

## 2013-12-13 NOTE — Telephone Encounter (Signed)
I reviewed not. Timothy BattenCody saw patient so I am not fully aware of what is going on. If patient could wait and let Cody handle that would be best. If he expresses need to be addressed now let me know what is going on and what medication he may need. Chronic pain syndrome.

## 2013-12-13 NOTE — Progress Notes (Signed)
Subjective:    Patient ID: Timothy Alvarez, male    DOB: 1955/09/30, 58 y.o.   MRN: [id number]  HPI  Timothy Alvarez is a 58 yr old male who presents today to discuss pain management. He has hx or chronic neuropathic pain. Regimen has included Butrans patch, cymbalta and gabapentin. Reports that he was paying 288/month out of pocket for butrans patch. He stopped butrans patch.  Reports that pain has worsened over the last 1 week.   He starts medicare next month.     Review of Systems See HPI  Past Medical History  Diagnosis Date  . Diabetes type 1, uncontrolled   . Frequent headaches   . Heart attack 09.13.2014    x5  . Hyperlipidemia   . Chicken pox   . Mumps   . Measles, [place] (rubella)   . Scarlet fever   . Diabetic retinopathy   . Cataracts, bilateral   . Orthostatic hypotension   . CAD (coronary artery disease)   . Contrast dye induced nephropathy   . OSA (obstructive sleep apnea) 07/17/2013    History   Social History  . Marital Status: Married    Spouse Name: N/A    Number of Children: 1  . Years of Education: N/A   Occupational History  . Not on file.   Social History Main Topics  . Smoking status: Never Smoker   . Smokeless tobacco: Not on file  . Alcohol Use: Yes     Comment: Rare  . Drug Use: No  . Sexual Activity: Not on file   Other Topics Concern  . 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  They have one grown daughter.   Highest level of education:  Master's Degree    Past Surgical History  Procedure Laterality Date  . Coronary angioplasty with stent placement    . Appendectomy  1966  . Wisdom tooth extraction    . Refractive surgery      Retinopathy    Family History  Problem Relation Age of Onset  . Vascular Disease Father 38    Deceased  . Transient ischemic attack Father   . Hypertension Mother 3    Deceased  . Liver cancer Paternal Grandfather   . Breast cancer  Sister   . Heart disease Sister     Ablation for rhythm disturbance  . Healthy Brother     x1  . Other Mother     s/p PPM    No Known Allergies  Current Outpatient Prescriptions on File Prior to Visit  Medication Sig Dispense Refill  . aspirin 81 MG tablet Take 81 mg by mouth daily.      . carvedilol (COREG) 12.5 MG tablet Take 6.25 mg by mouth 2 (two) times daily with a meal.      . clopidogrel (PLAVIX) 75 MG tablet Take 75 mg by mouth daily with breakfast.      . co-enzyme Q-10 30 MG capsule Take 30 mg by mouth daily.       . DOK PLUS 50-8.6 MG per tablet TAKE 1 TABLET BY MOUTH DAILY  100 tablet  0  . DULoxetine (CYMBALTA) 60 MG capsule TAKE 1 CAPSULE BY MOUTH DAILY.  30 capsule  1  . fludrocortisone (FLORINEF) 0.1 MG tablet TAKE 1 TABLET BY MOUTH TWICE DAILY  60 tablet  2  . gabapentin (NEURONTIN) 600 MG tablet 641m AM, 3081mwith  lunch, 632m PM      . glucagon 1 MG injection Inject 1 mg into the vein once as needed (Emergency Test Kit).      .Marland Kitchenglucose blood test strip 1 each by Other route as directed. Use as instructed FREESTYLE IN VITRO      . insulin lispro (HUMALOG) 100 UNIT/ML injection Inject into the skin as directed. Use in Insulin Pump, Max daily dose: 100 units/day      . isosorbide mononitrate (IMDUR) 30 MG 24 hr tablet Take 1 tablet (30 mg total) by mouth daily.  30 tablet  6  . L-Methylfolate-Algae-B12-B6 (METANX) 3-90.314-2-35 MG CAPS Take 1 capsule by mouth 2 (two) times daily.      . midodrine (PROAMATINE) 10 MG tablet TAKE 1 TABLET BY MOUTH TWICE DAILY  60 tablet  2  . rosuvastatin (CRESTOR) 10 MG tablet Take 10 mg by mouth daily.      .Marland Kitchenzolpidem (AMBIEN CR) 12.5 MG CR tablet TAKE 1 TABLET BY MOUTH AT BEDTIME  30 tablet  2   No current facility-administered medications on file prior to visit.    BP 109/65  Pulse 73  Temp(Src) 98.2 F (36.8 C) (Oral)  Resp 16  Ht 5' 6"  (1.676 m)  Wt 179 lb 6.4 oz (81.375 kg)  BMI 28.97 kg/m2  SpO2 99%         Objective:   Physical Exam  Constitutional: He is oriented to person, place, and time. He appears well-developed and well-nourished. No distress.  Cardiovascular: Normal rate and regular rhythm.   No murmur heard. Pulmonary/Chest: Effort normal and breath sounds normal. No respiratory distress. He has no wheezes. He has no rales. He exhibits no tenderness.  Neurological: He is alert and oriented to person, place, and time.  Psychiatric: He has a normal mood and affect. His behavior is normal. Judgment and thought content normal.          Assessment & Plan:

## 2013-12-13 NOTE — Telephone Encounter (Signed)
Just a FYI

## 2013-12-15 NOTE — Assessment & Plan Note (Signed)
Secondary to neuropathy. Remain off Butrans patch.  Will initiate Lyrica in addition to cymbalta and gabapentin.  A controlled substance contract is signed today and pt will be sent for urine drug toxicology.

## 2013-12-16 ENCOUNTER — Telehealth: Payer: Self-pay | Admitting: *Deleted

## 2013-12-16 NOTE — Telephone Encounter (Signed)
Received email from pharmacy that Lyrica is requiring prior authorization with insurance. PA completed cover my meds. Awaiting approval / denial.

## 2013-12-17 NOTE — Telephone Encounter (Signed)
PA for Lyrica approved effective 12/17/2013 through 12/17/2016. Approval letter sent for scanning. JG//CMA

## 2013-12-18 NOTE — Telephone Encounter (Signed)
Notified pt of Rx approval.

## 2013-12-18 NOTE — Telephone Encounter (Signed)
Left message on pharmacy voicemail that approval has been received if they have not already run Rx through system and to call if any problem.

## 2014-01-01 ENCOUNTER — Encounter: Payer: Self-pay | Admitting: Physician Assistant

## 2014-01-01 ENCOUNTER — Ambulatory Visit (INDEPENDENT_AMBULATORY_CARE_PROVIDER_SITE_OTHER): Payer: No Typology Code available for payment source | Admitting: Physician Assistant

## 2014-01-01 ENCOUNTER — Encounter: Payer: Self-pay | Admitting: Cardiology

## 2014-01-01 ENCOUNTER — Ambulatory Visit (INDEPENDENT_AMBULATORY_CARE_PROVIDER_SITE_OTHER): Payer: No Typology Code available for payment source | Admitting: Cardiology

## 2014-01-01 ENCOUNTER — Ambulatory Visit: Payer: No Typology Code available for payment source | Admitting: Cardiology

## 2014-01-01 VITALS — BP 115/62 | HR 76 | Temp 98.4°F | Resp 16 | Ht 66.0 in | Wt 182.4 lb

## 2014-01-01 VITALS — BP 130/68 | HR 73 | Ht 66.0 in | Wt 181.0 lb

## 2014-01-01 DIAGNOSIS — G894 Chronic pain syndrome: Secondary | ICD-10-CM | POA: Diagnosis not present

## 2014-01-01 DIAGNOSIS — R0609 Other forms of dyspnea: Secondary | ICD-10-CM

## 2014-01-01 DIAGNOSIS — I2510 Atherosclerotic heart disease of native coronary artery without angina pectoris: Secondary | ICD-10-CM

## 2014-01-01 DIAGNOSIS — I2583 Coronary atherosclerosis due to lipid rich plaque: Principal | ICD-10-CM

## 2014-01-01 DIAGNOSIS — I951 Orthostatic hypotension: Secondary | ICD-10-CM

## 2014-01-01 MED ORDER — HYDROCODONE-ACETAMINOPHEN 5-325 MG PO TABS: 1.0000 | ORAL_TABLET | Freq: Three times a day (TID) | ORAL | Status: DC

## 2014-01-01 MED ORDER — GABAPENTIN 600 MG PO TABS: 600.0000 mg | ORAL_TABLET | Freq: Three times a day (TID) | ORAL | Status: DC

## 2014-01-01 NOTE — Progress Notes (Signed)
Pre visit review using our clinic review tool, if applicable. No additional management support is needed unless otherwise documented below in the visit note/SLS  

## 2014-01-01 NOTE — Assessment & Plan Note (Signed)
Continue aspirin and statin. It has been over one year since his previous stent. Discontinue Plavix.

## 2014-01-01 NOTE — Progress Notes (Signed)
HPI: Timothy Alvarez. Patient previously lived in [place]. He had his first myocardial infarction in 2010. He has had subsequent events. He moved here in May of 2014. Echocardiogram October 2013 showed normal LV function and grade 2 diastolic dysfunction. Last cardiac catheterization in September 2014 and had stent to the mid right coronary artery. There was note of a mid-90% LAD at the LAD was small caliber and not suitable for PCI of grafting. Ejection fraction 60%. Procedure complicated by contrast nephropathy requiring dialysis. Carotid Dopplers February 2015 showed mild focal plaques in both carotid bifurcations but no hemodynamically significant stenosis. Seen in the office in September of 2015 with increased dyspnea. Nitrates added to his medical regimen. Since then He denies dyspnea, chest pain, palpitations or syncope.   Current Outpatient Prescriptions  Medication Sig Dispense Refill  . aspirin 81 MG tablet Take 81 mg by mouth daily.      . clopidogrel (PLAVIX) 75 MG tablet Take 75 mg by mouth daily with breakfast.      . co-enzyme Q-10 30 MG capsule Take 30 mg by mouth daily.       . DOK PLUS 50-8.6 MG per tablet TAKE 1 TABLET BY MOUTH DAILY  100 tablet  0  . DULoxetine (CYMBALTA) 60 MG capsule TAKE 1 CAPSULE BY MOUTH DAILY.  30 capsule  1  . fludrocortisone (FLORINEF) 0.1 MG tablet TAKE 1 TABLET BY MOUTH TWICE DAILY  60 tablet  2  . gabapentin (NEURONTIN) 600 MG tablet 672m AM, 3041mwith lunch, 60040mM      . glucagon 1 MG injection Inject 1 mg into the vein once as needed (Emergency Test Kit).      . gMarland Kitchenucose blood test strip 1 each by Other route as directed. Use as instructed FREESTYLE IN VITRO      . insulin lispro (HUMALOG) 100 UNIT/ML injection Inject into the skin as directed. Use in Insulin Pump, Max daily dose: 100 units/day      . isosorbide mononitrate (IMDUR) 30 MG 24 hr tablet Take 1 tablet (30 mg total) by mouth daily.  30 tablet  6  . L-Methylfolate-Algae-B12-B6  (METANX) 3-90.314-2-35 MG CAPS Take 1 capsule by mouth 2 (two) times daily.      . metoCLOPramide (REGLAN) 10 MG tablet       . midodrine (PROAMATINE) 10 MG tablet TAKE 1 TABLET BY MOUTH TWICE DAILY  60 tablet  2  . rosuvastatin (CRESTOR) 10 MG tablet Take 10 mg by mouth daily.      . Sennosides (SENOKOT PO) Take 1 tablet by mouth 2 (two) times daily.      . zMarland Kitchenlpidem (AMBIEN CR) 12.5 MG CR tablet TAKE 1 TABLET BY MOUTH AT BEDTIME  30 tablet  2  . carvedilol (COREG) 12.5 MG tablet Take 6.25 mg by mouth 2 (two) times daily with a meal.       No current facility-administered medications for this visit.     Past Medical History  Diagnosis Date  . Diabetes type 1, uncontrolled   . Frequent headaches   . Heart attack 09.13.2014    x5  . Hyperlipidemia   . Chicken pox   . Mumps   . Measles, [place]ella)   . Scarlet fever   . Diabetic retinopathy   . Cataracts, bilateral   . Orthostatic hypotension   . Alvarez (coronary artery disease)   . Contrast dye induced nephropathy   . OSA (obstructive sleep apnea) 07/17/2013  Past Surgical History  Procedure Laterality Date  . Coronary angioplasty with stent placement    . Appendectomy  1966  . Wisdom tooth extraction    . Refractive surgery      Retinopathy    History   Social History  . Marital Status: Married    Spouse Name: N/A    Number of Children: 1  . Years of Education: N/A   Occupational History  . Not on file.   Social History Main Topics  . Smoking status: Never Smoker   . Smokeless tobacco: Not on file  . Alcohol Use: Yes     Comment: Rare  . Drug Use: No  . Sexual Activity: Not on file   Other Topics Concern  . 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  They have one grown daughter.   Highest level of education:  Master's Degree    ROS: no fevers or chills, productive cough, hemoptysis, dysphasia, odynophagia, melena, hematochezia,  dysuria, hematuria, rash, seizure activity, orthopnea, PND, pedal edema, claudication. Remaining systems are negative.  Physical Exam: Well-developed well-nourished in no acute distress.  Skin is warm and dry.  HEENT is normal.  Neck is supple.  Chest is clear to auscultation with normal expansion.  Cardiovascular exam is regular rate and rhythm.  Abdominal exam nontender or distended. No masses palpated. Extremities show no edema. neuro grossly intact

## 2014-01-01 NOTE — Assessment & Plan Note (Signed)
Continue present dose of Crestor. He apparently had issues with myalgias at higher doses in the past.

## 2014-01-01 NOTE — Assessment & Plan Note (Signed)
Hold off of Lyrica.  Increase Gabapentin to 600 mg TID.  Norco given (15 tablets) only for breakthrough pain.  To be used very sparingly. Reiterated CSC with patient.  Follow-up in 4-6 weeks.

## 2014-01-01 NOTE — Patient Instructions (Signed)
Increase gabapentin to 600 mg three times daily.  Use Norco only if needed for severe pain.  Use sparingly.  This is just to help you through the winter months. Follow-up with me in 4-6 weeks.  Return sooner if needed.

## 2014-01-01 NOTE — Patient Instructions (Signed)
Your physician wants you to follow-up in: 6 MONTHS WITH DR CRENSHAW You will receive a reminder letter in the mail two months in advance. If you don't receive a letter, please call our office to schedule the follow-up appointment.   STOP PLAVIX   

## 2014-01-01 NOTE — Assessment & Plan Note (Signed)
Continue midodrine  

## 2014-01-01 NOTE — Progress Notes (Signed)
Patient presents to clinic today for follow-up of chronic pain syndrome.  Patient was started on Lyrica 1 month ago in addition to his gabapentin and Cymbalta.  Patient states he never picked up medication due to reading reviews about the medicne.  Endorses some worsening in bilateral shoulder pain -- described and achy and burning in nature.  Past Medical History  Diagnosis Date  . Diabetes type 1, uncontrolled   . Frequent headaches   . Heart attack 09.13.2014    x5  . Hyperlipidemia   . Chicken pox   . Mumps   . Measles, [place] (rubella)   . Scarlet fever   . Diabetic retinopathy   . Cataracts, bilateral   . Orthostatic hypotension   . CAD (coronary artery disease)   . Contrast dye induced nephropathy   . OSA (obstructive sleep apnea) 07/17/2013    Current Outpatient Prescriptions on File Prior to Visit  Medication Sig Dispense Refill  . aspirin 81 MG tablet Take 81 mg by mouth daily.      . carvedilol (COREG) 12.5 MG tablet Take 6.25 mg by mouth 2 (two) times daily with a meal.      . co-enzyme Q-10 30 MG capsule Take 30 mg by mouth daily.       . DOK PLUS 50-8.6 MG per tablet TAKE 1 TABLET BY MOUTH DAILY  100 tablet  0  . DULoxetine (CYMBALTA) 60 MG capsule TAKE 1 CAPSULE BY MOUTH DAILY.  30 capsule  1  . fludrocortisone (FLORINEF) 0.1 MG tablet TAKE 1 TABLET BY MOUTH TWICE DAILY  60 tablet  2  . glucagon 1 MG injection Inject 1 mg into the vein once as needed (Emergency Test Kit).      Marland Kitchen glucose blood test strip 1 each by Other route as directed. Use as instructed FREESTYLE IN VITRO      . insulin lispro (HUMALOG) 100 UNIT/ML injection Inject into the skin as directed. Use in Insulin Pump, Max daily dose: 100 units/day      . isosorbide mononitrate (IMDUR) 30 MG 24 hr tablet Take 1 tablet (30 mg total) by mouth daily.  30 tablet  6  . L-Methylfolate-Algae-B12-B6 (METANX) 3-90.314-2-35 MG CAPS Take 1 capsule by mouth 2 (two) times daily.      . midodrine (PROAMATINE) 10 MG  tablet TAKE 1 TABLET BY MOUTH TWICE DAILY  60 tablet  2  . rosuvastatin (CRESTOR) 10 MG tablet Take 10 mg by mouth daily.      . Sennosides (SENOKOT PO) Take 1 tablet by mouth 2 (two) times daily.      Marland Kitchen zolpidem (AMBIEN CR) 12.5 MG CR tablet TAKE 1 TABLET BY MOUTH AT BEDTIME  30 tablet  2   No current facility-administered medications on file prior to visit.    No Known Allergies  Family History  Problem Relation Age of Onset  . Vascular Disease Father 43    Deceased  . Transient ischemic attack Father   . Hypertension Mother 68    Deceased  . Liver cancer Paternal Grandfather   . Breast cancer Sister   . Heart disease Sister     Ablation for rhythm disturbance  . Healthy Brother     x1  . Other Mother     s/p PPM    History   Social History  . Marital Status: Married    Spouse Name: N/A    Number of Children: 1  . Years of Education: N/A   Social  History Main Topics  . Smoking status: Never Smoker   . Smokeless tobacco: None  . Alcohol Use: Yes     Comment: Rare  . Drug Use: No  . Sexual Activity: None   Other Topics Concern  . 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  They have one grown daughter.   Highest level of education:  Master's Degree   Review of Systems - See HPI.  All other ROS are negative.  BP 115/62  Pulse 76  Temp(Src) 98.4 F (36.9 C) (Oral)  Resp 16  Ht 5' 6" (1.676 m)  Wt 182 lb 6 oz (82.725 kg)  BMI 29.45 kg/m2  SpO2 97%  Physical Exam  Vitals reviewed. Constitutional: He is oriented to person, place, and time and well-developed, well-nourished, and in no distress.  HENT:  Head: Normocephalic and atraumatic.  Eyes: Conjunctivae are normal.  Cardiovascular: Normal rate, regular rhythm, normal heart sounds and intact distal pulses.   Pulmonary/Chest: Effort normal and breath sounds normal. No respiratory distress. He has no wheezes. He has no rales. He exhibits no  tenderness.  Neurological: He is alert and oriented to person, place, and time.  Skin: Skin is warm and dry. No rash noted.  Psychiatric: Affect normal.    No results found for this or any previous visit (from the past 2160 hour(s)).  Assessment/Plan: Chronic pain syndrome Hold off of Lyrica.  Increase Gabapentin to 600 mg TID.  Norco given (15 tablets) only for breakthrough pain.  To be used very sparingly. Reiterated CSC with patient.  Follow-up in 4-6 weeks.

## 2014-01-01 NOTE — Assessment & Plan Note (Signed)
Dyspnea has improved since last evaluation. Continue nitrates. This may be related to previous LAD disease which was not amenable to PCI based on outside report.

## 2014-01-06 ENCOUNTER — Other Ambulatory Visit: Payer: Self-pay | Admitting: *Deleted

## 2014-01-06 MED ORDER — METOCLOPRAMIDE HCL 10 MG PO TABS: 10.0000 mg | ORAL_TABLET | Freq: Three times a day (TID) | ORAL | Status: DC | PRN

## 2014-01-06 NOTE — Progress Notes (Signed)
Per pharmacy fax request/SLS

## 2014-01-20 ENCOUNTER — Other Ambulatory Visit: Payer: Self-pay | Admitting: Physician Assistant

## 2014-01-20 NOTE — Telephone Encounter (Signed)
Refill sent per [facility] refill protocol/SLS  

## 2014-01-27 ENCOUNTER — Telehealth: Payer: Self-pay | Admitting: Pulmonary Disease

## 2014-01-27 ENCOUNTER — Other Ambulatory Visit: Payer: Self-pay | Admitting: Physician Assistant

## 2014-01-27 NOTE — Telephone Encounter (Signed)
LMTCB

## 2014-01-27 NOTE — Telephone Encounter (Signed)
Rx request to pharmacy/SLS  

## 2014-01-28 NOTE — Telephone Encounter (Signed)
The number pt gave us was for [employer].  I spoke with Misty StanleyLisa at [facility] and she states that pt's download from 09/05/13 through 10/04/13 showed non compliance and that there is nothing can do to reverse the insurance decision.  I informed pt of my conversation with Christoper Allegrapria and he states he does not understand because he uses the machine at least 8 hrs/night.  I called [employer] back and spoke to WhitestoneJessica and she states the only thing the pt can do is file an appeal with [employer].  I notified the pt of this and he will start the process for the appeal.

## 2014-01-28 NOTE — Telephone Encounter (Signed)
Spoke with pt.  He states that his [employer] insurance denied coverage for his CPAP and charged it to his credit card because they said he does not use it regularly.  PT states that he wears it every night with an average of 8 hrs/ night and also wears it during daytime naps.  Pt was not sure if they had a download or not.  He ask that we call [employer] and speak to Well[employer] geologistmedical director.  Phone # is [phone] and fax # [phone].

## 2014-01-31 ENCOUNTER — Encounter: Payer: Self-pay | Admitting: Physician Assistant

## 2014-01-31 ENCOUNTER — Ambulatory Visit (INDEPENDENT_AMBULATORY_CARE_PROVIDER_SITE_OTHER): Payer: Medicare Other | Admitting: Physician Assistant

## 2014-01-31 VITALS — BP 126/78 | HR 76 | Temp 98.1°F | Resp 16 | Wt 184.1 lb

## 2014-01-31 DIAGNOSIS — R5382 Chronic fatigue, unspecified: Secondary | ICD-10-CM

## 2014-01-31 DIAGNOSIS — G894 Chronic pain syndrome: Secondary | ICD-10-CM

## 2014-01-31 DIAGNOSIS — I2510 Atherosclerotic heart disease of native coronary artery without angina pectoris: Secondary | ICD-10-CM

## 2014-01-31 DIAGNOSIS — Z79899 Other long term (current) drug therapy: Secondary | ICD-10-CM

## 2014-01-31 LAB — CBC
HEMATOCRIT: 43.6 % (ref 39.0–52.0)
HEMOGLOBIN: 14.1 g/dL (ref 13.0–17.0)
MCHC: 32.2 g/dL (ref 30.0–36.0)
MCV: 88.6 fl (ref 78.0–100.0)
Platelets: 235 10*3/uL (ref 150.0–400.0)
RBC: 4.93 Mil/uL (ref 4.22–5.81)
RDW: 13.7 % (ref 11.5–15.5)
WBC: 6.1 10*3/uL (ref 4.0–10.5)

## 2014-01-31 LAB — VITAMIN D 25 HYDROXY (VIT D DEFICIENCY, FRACTURES): VITD: 16.39 ng/mL — ABNORMAL LOW (ref 30.00–100.00)

## 2014-01-31 LAB — VITAMIN B12: Vitamin B-12: 788 pg/mL (ref 211–911)

## 2014-01-31 LAB — TSH: TSH: 2.08 u[IU]/mL (ref 0.35–4.50)

## 2014-01-31 MED ORDER — HYDROCODONE-ACETAMINOPHEN 5-325 MG PO TABS: 1.0000 | ORAL_TABLET | Freq: Three times a day (TID) | ORAL | Status: DC

## 2014-01-31 NOTE — Patient Instructions (Addendum)
Please continue medications as directed.  Stay well hydrated and try to stay active.  I will call you with your results.  We will alter therapy based on results.  Follow-up with Cardiology as directed.  Chronic Fatigue Syndrome Chronic fatigue syndrome (CFS) is a condition in which there is lasting, extreme tiredness (fatigue) that does not improve with rest. CFS affects women up to four times more often than men. If you have CFS, fatigue and other symptoms can make it hard for you to get through your day. There is no treatment or cure. You will need to work closely with your health care provider to come up with a treatment plan that works for you. CAUSES  No one knows what causes CFS. It may be triggered by a flu-like illness or by mono. Other triggers may include:  An abnormal immune system.  Low blood pressure.  Poor diet.  Physical or emotional stress. SIGNS AND SYMPTOMS The main symptom is fatigue that lasts all day, especially after physical or mental stress. Other common symptoms include:  An extreme loss of energy with no obvious cause.  Muscle or joint soreness.  Severe weakness.  Frequent headaches.  Fever.  Sore throat.  Swollen lymph glands.  Sleep is not refreshing.  Loss of concentration or memory. Less common symptoms may include:  Chills.  Night sweats.  Tingling or numbness.  Blurred vision.  Dizziness.  Sensitivity to noise or odors.  Mood swings.  Anxiety, panic attacks, and depression. Your symptoms may come and go, or you may have them all the time. DIAGNOSIS  There are no tests that can help health care providers diagnose CFS. It may take a long time for you to get a correct diagnosis. Your health care provider may need to do a number of tests to rule out other conditions that could be causing your symptoms. You may be diagnosed with CFS if:  You have fatigue that has lasted for at least six months.  Your fatigue is not relieved by  rest.  Your fatigue is not caused by another condition.  Your fatigue is severe enough to interfere with work and daily activities.  You have at least four common symptoms of CFS. TREATMENT  There is no cure for CFS at this time. The condition affects everyone differently. You will need to work with your health care provider to find the best treatment for your symptoms. Treatment may include:  Improving sleep with a regular bedtime routine.  Avoiding caffeine, alcohol, and tobacco.  Doing light exercise and stretching during the day.  Taking medicine to help you sleep.  Taking over-the-counter medicines to relieve joint or muscle pain.  Learning and practicing relaxation techniques.  Using memory aids or doing brain teasers to improve memory and concentration.  Seeing a mental health professional to evaluate and treat depression, if necessary.  Trying massage therapy, acupuncture, and movement exercises, like yoga or tai chi. [place] Work closely with your health care provider to follow your treatment plan at home. You may need to make major lifestyle changes. If treatment does not seem to help, get a second opinion. You may get help from many health care providers, including doctors, mental health specialists, physical therapists, and rehabilitation therapists. Having the support of friends and loved ones is also important. SEEK MEDICAL CARE IF:  Your symptoms are not responding to treatment.  You are having strong feelings of anger, guilt, anxiety, or depression. Document Released: 04/07/2004 Document Revised: 07/15/2013 Document Reviewed:  01/18/2013 ExitCare Patient Information 2015 [place], [place]. This information is not intended to replace advice given to you by your health care provider. Make sure you discuss any questions you have with your health care provider.

## 2014-01-31 NOTE — Progress Notes (Signed)
Patient presents to clinic today for management of chronic pain.  Patient endorses grogginess with increase in midday dose of gabapentin. Has resumed original dosing of 600 mg AM, 300 mg Noon and 600 mg PM. Endorses relief with PRN Norco.  Some days are better than others.  Patient always seeming to have worsening of symptoms in the winter months.  Is aware he will need UDS before more chronic course of narcotic will be given.  Patient also complains of significant fatigue over the past couple of months.  Would like his Vitamin D level rechecked as he has a history of deficiency.  Past Medical History  Diagnosis Date  . Diabetes type 1, uncontrolled   . Frequent headaches   . Heart attack 09.13.2014    x5  . Hyperlipidemia   . Chicken pox   . Mumps   . Measles, [place] (rubella)   . Scarlet fever   . Diabetic retinopathy   . Cataracts, bilateral   . Orthostatic hypotension   . CAD (coronary artery disease)   . Contrast dye induced nephropathy   . OSA (obstructive sleep apnea) 07/17/2013    Current Outpatient Prescriptions on File Prior to Visit  Medication Sig Dispense Refill  . aspirin 81 MG tablet Take 81 mg by mouth daily.    Marland Kitchen co-enzyme Q-10 30 MG capsule Take 30 mg by mouth daily.     . DOK PLUS 50-8.6 MG per tablet TAKE 1 TABLET BY MOUTH DAILY 100 tablet 0  . DULoxetine (CYMBALTA) 60 MG capsule TAKE 1 CAPSULE BY MOUTH DAILY. 30 capsule 1  . fludrocortisone (FLORINEF) 0.1 MG tablet TAKE 1 TABLET BY MOUTH TWICE DAILY 60 tablet 2  . gabapentin (NEURONTIN) 600 MG tablet Take 1 tablet (600 mg total) by mouth 3 (three) times daily. 625m AM, 3076mwith lunch, 60042mM 270 tablet 1  . glucagon 1 MG injection Inject 1 mg into the vein once as needed (Emergency Test Kit).    . gMarland Kitchenucose blood test strip 1 each by Other route as directed. Use as instructed FREESTYLE IN VITRO    . insulin lispro (HUMALOG) 100 UNIT/ML injection Inject into the skin as directed. Use in Insulin Pump, Max  daily dose: 100 units/day    . isosorbide mononitrate (IMDUR) 30 MG 24 hr tablet Take 1 tablet (30 mg total) by mouth daily. 30 tablet 6  . L-Methylfolate-Algae-B12-B6 (METANX) 3-90.314-2-35 MG CAPS Take 1 capsule by mouth 2 (two) times daily.    . metoCLOPramide (REGLAN) 10 MG tablet Take 1 tablet (10 mg total) by mouth every 8 (eight) hours as needed for nausea. 30 tablet 0  . midodrine (PROAMATINE) 10 MG tablet TAKE 1 TABLET BY MOUTH TWICE DAILY 60 tablet 2  . rosuvastatin (CRESTOR) 10 MG tablet Take 10 mg by mouth daily.    . Sennosides (SENOKOT PO) Take 1 tablet by mouth 2 (two) times daily.    . zMarland Kitchenlpidem (AMBIEN CR) 12.5 MG CR tablet TAKE 1 TABLET BY MOUTH AT BEDTIME 30 tablet 2   No current facility-administered medications on file prior to visit.    No Known Allergies  Family History  Problem Relation Age of Onset  . Vascular Disease Father 96 34 Deceased  . Transient ischemic attack Father   . Hypertension Mother 93 78 Deceased  . Liver cancer Paternal Grandfather   . Breast cancer Sister   . Heart disease Sister     Ablation for rhythm disturbance  .  Healthy Brother     x1  . Other Mother     s/p PPM    History   Social History  . Marital Status: Married    Spouse Name: N/A    Number of Children: 1  . Years of Education: N/A   Social History Main Topics  . Smoking status: Never Smoker   . Smokeless tobacco: None  . Alcohol Use: Yes     Comment: Rare  . Drug Use: No  . Sexual Activity: None   Other Topics Concern  . 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  They have one grown daughter.   Highest level of education:  Master's Degree   Review of Systems - See HPI.  All other ROS are negative.  BP 126/78 mmHg  Pulse 76  Temp(Src) 98.1 F (36.7 C) (Oral)  Resp 16  Wt 184 lb 2 oz (83.519 kg)  SpO2 97%  Physical Exam  Constitutional: He is oriented to person, place, and time and  well-developed, well-nourished, and in no distress.  HENT:  Head: Normocephalic and atraumatic.  Cardiovascular: Normal rate, regular rhythm, normal heart sounds and intact distal pulses.   Pulmonary/Chest: Effort normal and breath sounds normal.  Neurological: He is alert and oriented to person, place, and time.  Skin: Skin is warm and dry. No rash noted.  Vitals reviewed.  Assessment/Plan: Chronic pain syndrome UDS obtained today,  Norco refilled. Continue gabapentin as directed.  Follow-up in 1 month.

## 2014-02-03 ENCOUNTER — Telehealth: Payer: Self-pay | Admitting: Physician Assistant

## 2014-02-03 DIAGNOSIS — R5383 Other fatigue: Secondary | ICD-10-CM | POA: Insufficient documentation

## 2014-02-03 DIAGNOSIS — E559 Vitamin D deficiency, unspecified: Secondary | ICD-10-CM

## 2014-02-03 MED ORDER — VITAMIN D (ERGOCALCIFEROL) 1.25 MG (50000 UNIT) PO CAPS: 50000.0000 [IU] | ORAL_CAPSULE | ORAL | Status: DC

## 2014-02-03 NOTE — Assessment & Plan Note (Signed)
Will obtain labs today to assess.  Fatigue likely partially due to multiple co-morbidities, especially neuropathy and chronic pain.  Patient also with Vit D deficiency.  Will treat based on results. Supportive measures discussed with apteitn.

## 2014-02-03 NOTE — Telephone Encounter (Signed)
Labs look good overall.  Vitamin D level is significantly decreased which can be a contributing factor to his fatigue, along with his chronic medical conditions.  I have sent in a Rx for Vitamin D 50,000 units.  He is to take 1 pill each week for 8 weeks.  We will recheck Vitamin D level in 2 months.

## 2014-02-03 NOTE — Assessment & Plan Note (Signed)
UDS obtained today,  Norco refilled. Continue gabapentin as directed.  Follow-up in 1 month.

## 2014-02-04 NOTE — Telephone Encounter (Signed)
Called and left message for patient to return call regarding lab results. JG//CMA

## 2014-02-05 NOTE — Telephone Encounter (Signed)
Patient informed, understood & agreed/SLS  

## 2014-02-12 ENCOUNTER — Ambulatory Visit: Payer: No Typology Code available for payment source | Admitting: Physician Assistant

## 2014-02-14 ENCOUNTER — Telehealth: Payer: Self-pay | Admitting: Physician Assistant

## 2014-02-14 ENCOUNTER — Telehealth: Payer: Self-pay | Admitting: *Deleted

## 2014-02-14 MED ORDER — ZOLPIDEM TARTRATE ER 12.5 MG PO TBCR: EXTENDED_RELEASE_TABLET | ORAL | Status: DC

## 2014-02-14 NOTE — Telephone Encounter (Signed)
Medication refilled. Please fax to pharmacy.

## 2014-02-14 NOTE — Telephone Encounter (Signed)
rx sent to pt's pharmacy

## 2014-02-14 NOTE — Telephone Encounter (Signed)
rx refill- ambien 12.5mg  Last OV- 01/31/14  Last refilled - 11/19/13 #/30 / 2 rf.  UDS- 12/16/13 LOW risk.

## 2014-02-14 NOTE — Telephone Encounter (Signed)
See below

## 2014-02-27 ENCOUNTER — Encounter: Payer: Self-pay | Admitting: Medical

## 2014-02-27 ENCOUNTER — Ambulatory Visit (HOSPITAL_BASED_OUTPATIENT_CLINIC_OR_DEPARTMENT_OTHER)
Admission: RE | Admit: 2014-02-27 | Discharge: 2014-02-27 | Disposition: A | Discharge status: Home / Self Care | Payer: Medicare Other | Source: Ambulatory Visit | Attending: Medical | Admitting: Medical

## 2014-02-27 ENCOUNTER — Ambulatory Visit (INDEPENDENT_AMBULATORY_CARE_PROVIDER_SITE_OTHER): Payer: Medicare Other | Admitting: Medical

## 2014-02-27 VITALS — BP 135/84 | HR 75 | Temp 97.9°F | Ht 66.0 in | Wt 179.8 lb

## 2014-02-27 DIAGNOSIS — R51 Headache: Secondary | ICD-10-CM | POA: Diagnosis present

## 2014-02-27 DIAGNOSIS — R42 Dizziness and giddiness: Secondary | ICD-10-CM

## 2014-02-27 DIAGNOSIS — R5383 Other fatigue: Secondary | ICD-10-CM

## 2014-02-27 DIAGNOSIS — G319 Degenerative disease of nervous system, unspecified: Secondary | ICD-10-CM | POA: Diagnosis not present

## 2014-02-27 DIAGNOSIS — R2689 Other abnormalities of gait and mobility: Secondary | ICD-10-CM | POA: Insufficient documentation

## 2014-02-27 DIAGNOSIS — J0100 Acute maxillary sinusitis, unspecified: Secondary | ICD-10-CM

## 2014-02-27 DIAGNOSIS — R519 Headache, unspecified: Secondary | ICD-10-CM

## 2014-02-27 MED ORDER — CEFDINIR 300 MG PO CAPS: 300.0000 mg | ORAL_CAPSULE | Freq: Two times a day (BID) | ORAL | Status: DC

## 2014-02-27 MED ORDER — FLUTICASONE PROPIONATE 50 MCG/ACT NA SUSP: 2.0000 | Freq: Every day | NASAL | Status: DC

## 2014-02-27 MED ORDER — MECLIZINE HCL 12.5 MG PO TABS: 12.5000 mg | ORAL_TABLET | Freq: Three times a day (TID) | ORAL | Status: DC | PRN

## 2014-02-27 NOTE — Patient Instructions (Signed)
Your appear to have a sinus infection. I am prescribing cefdinir  antibiotic for the infection. To help with the nasal congestion I prescribed flonase nasal steroid.   For your profound dizziness(with temporal ha)so much so that you can't stand without concern for fall, I want to get ct of head without contrast. If study negative then can use meclizine rx and call us with update tomorrow  on dizziness.  If study negative but worse neurologic symptoms as discussed then ED evaluation.  Rest, hydrate, tylenol for fever.  Follow up in 7 days or as needed.

## 2014-02-27 NOTE — Assessment & Plan Note (Signed)
Your appear to have a sinus infection. I am prescribing cefdinir  antibiotic for the infection. To help with the nasal congestion I prescribed flonase nasal steroid.

## 2014-02-27 NOTE — Assessment & Plan Note (Signed)
For your profound dizziness(with temporal ha)so much so that you can't stand without concern for fall, I want to get ct of head without contrast. If study negative then can use meclizine rx and call us with update tomorrow  on dizziness.  If study negative but worse neurologic symptoms as discussed then ED evaluation  Pt instructed to wait until the test results back. Pt still left for home. Will call him with the results.

## 2014-02-27 NOTE — Progress Notes (Signed)
Subjective:    Patient ID: Timothy Alvarez, male    DOB: 07-Dec-1955, 58 y.o.   MRN: [id number]  HPI   Pt in today reporting mostly  nasal congestion, pnd, some chest congestion and runny nose for  7  Days. Pt has poor balance and this after onset of illness. Pt states sitting feels fine. But when ambulates he is off balance.   Off and on balance issues in the past. Pt had some frontal sinus pressure. Some temporal area pain. No nausea, no vomiting, no vision changes. No light flashing in visual field. No arms or leg weakness.   Associated symptoms( below yes or no)  Fever-yes fever subjective. Chills-yes Chest congestion-yes Sneezing- yes Itching eyes-no Sore throat- yes mild scratchy. Post-nasal drainage-yes. Wheezing-NO Purulent nasal drainage-no Fatigue-mild  Progressively worsening severe dizziness. Worse last 2 days and does not use cane very often on clarication by wife.  Past Medical History  Diagnosis Date  . Diabetes type 1, uncontrolled   . Frequent headaches   . Heart attack 09.13.2014    x5  . Hyperlipidemia   . Chicken pox   . Mumps   . Measles, [place] (rubella)   . Scarlet fever   . Diabetic retinopathy   . Cataracts, bilateral   . Orthostatic hypotension   . CAD (coronary artery disease)   . Contrast dye induced nephropathy   . OSA (obstructive sleep apnea) 07/17/2013    History   Social History  . Marital Status: Married    Spouse Name: N/A    Number of Children: 1  . Years of Education: N/A   Occupational History  . Not on file.   Social History Main Topics  . Smoking status: Never Smoker   . Smokeless tobacco: Not on file  . Alcohol Use: Yes     Comment: Rare  . Drug Use: No  . Sexual Activity: Not on file   Other Topics Concern  . 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  They have one grown daughter.   Highest level of education:  Master's Degree    Past Surgical  History  Procedure Laterality Date  . Coronary angioplasty with stent placement    . Appendectomy  1966  . Wisdom tooth extraction    . Refractive surgery      Retinopathy    Family History  Problem Relation Age of Onset  . Vascular Disease Father 55    Deceased  . Transient ischemic attack Father   . Hypertension Mother 75    Deceased  . Liver cancer Paternal Grandfather   . Breast cancer Sister   . Heart disease Sister     Ablation for rhythm disturbance  . Healthy Brother     x1  . Other Mother     s/p PPM    No Known Allergies  Current Outpatient Prescriptions on File Prior to Visit  Medication Sig Dispense Refill  . aspirin 81 MG tablet Take 81 mg by mouth daily.    Marland Kitchen co-enzyme Q-10 30 MG capsule Take 30 mg by mouth daily.     . DOK PLUS 50-8.6 MG per tablet TAKE 1 TABLET BY MOUTH DAILY 100 tablet 0  . DULoxetine (CYMBALTA) 60 MG capsule TAKE 1 CAPSULE BY MOUTH DAILY. 30 capsule 1  . fludrocortisone (FLORINEF) 0.1 MG tablet TAKE 1 TABLET BY MOUTH TWICE DAILY 60 tablet 2  . gabapentin (  NEURONTIN) 600 MG tablet Take 1 tablet (600 mg total) by mouth 3 (three) times daily. 648m AM, 3051mwith lunch, 60060mM 270 tablet 1  . glucagon 1 MG injection Inject 1 mg into the vein once as needed (Emergency Test Kit).    . gMarland Kitchenucose blood test strip 1 each by Other route as directed. Use as instructed FREESTYLE IN VITRO    . HYDROcodone-acetaminophen (NORCO) 5-325 MG per tablet Take 1 tablet by mouth every 8 (eight) hours. Use sparingly. 30 tablet 0  . insulin lispro (HUMALOG) 100 UNIT/ML injection Inject into the skin as directed. Use in Insulin Pump, Max daily dose: 100 units/day    . L-Methylfolate-Algae-B12-B6 (METANX) 3-90.314-2-35 MG CAPS Take 1 capsule by mouth 2 (two) times daily.    . metoCLOPramide (REGLAN) 10 MG tablet Take 1 tablet (10 mg total) by mouth every 8 (eight) hours as needed for nausea. 30 tablet 0  . midodrine (PROAMATINE) 10 MG tablet TAKE 1 TABLET BY MOUTH  TWICE DAILY 60 tablet 2  . Sennosides (SENOKOT PO) Take 1 tablet by mouth 2 (two) times daily.    . Vitamin D, Ergocalciferol, (DRISDOL) 50000 UNITS CAPS capsule Take 1 capsule (50,000 Units total) by mouth every 7 (seven) days. 8 capsule 0  . zolpidem (AMBIEN CR) 12.5 MG CR tablet TAKE 1 TABLET BY MOUTH AT BEDTIME 30 tablet 2   No current facility-administered medications on file prior to visit.    BP 135/84 mmHg  Pulse 75  Temp(Src) 97.9 F (36.6 C) (Oral)  Ht 5' 6" (1.676 m)  Wt 179 lb 12.8 oz (81.557 kg)  BMI 29.03 kg/m2  SpO2 96%        Review of Systems  Constitutional: Positive for fever, chills and fatigue.  HENT: Positive for congestion, postnasal drip, sinus pressure and sore throat.   Respiratory: Negative for cough and wheezing.        Chest congestion.  Cardiovascular: Negative for chest pain and palpitations.  Musculoskeletal: Negative for back pain and neck pain.  Neurological: Positive for dizziness and headaches. Negative for tremors, seizures, syncope, speech difficulty, weakness, light-headedness and numbness.       Frontal early in week now some bitemporal.  Hematological: Negative for adenopathy. Does not bruise/bleed easily.       Objective:   Physical Exam   General  Mental Status - Alert. General Appearance - Well groomed. Not in acute distress.  Skin Rashes- No Rashes.  HEENT Head- Normal. Ear Auditory Canal - Left- Normal. Right - Normal.Tympanic Membrane- Left- Normal. Right- Normal. Eye Sclera/Conjunctiva- Left- Normal. Right- Normal. Nose & Sinuses Nasal Mucosa- Left-  Boggy and Congested. Right-  Boggy and  Congested.faint Bilateral maxillary and frontal sinus pressure. Mouth & Throat Lips: Upper Lip- Normal: no dryness, cracking, pallor, cyanosis, or vesicular eruption. Lower Lip-Normal: no dryness, cracking, pallor, cyanosis or vesicular eruption. Buccal Mucosa- Bilateral- No Aphthous ulcers. Oropharynx- No Discharge or  Erythema. Tonsils: Characteristics- Bilateral- No Erythema or Congestion. Size/Enlargement- Bilateral- No enlargement. Discharge- bilateral-None.  Neck Neck- Supple. No Masses.   Chest and Lung Exam Auscultation: Breath Sounds:-Clear even and unlabored.  Cardiovascular Auscultation:Rythm- Regular, rate and rhythm. Murmurs & Other Heart Sounds:Ausculatation of the heart reveal- No Murmurs.  Lymphatic Head & Neck General Head & Neck Lymphatics: Bilateral: Description- No Localized lymphadenopathy.   Neurologic Cranial Nerve exam:- CN III-XII intact(No nystagmus), symmetric smile. Drift Test:- No drift. Romberg Exam:- obvious very poor balance. Very shaky on testing. Heal to Toe Gait exam:-Not done  since romberg was very poor. Finger to Nose:- Normal/Intact Strength:- 5/5 equal and symmetric strength both upper and lower extremities.           Assessment & Plan:

## 2014-02-27 NOTE — Telephone Encounter (Signed)
I called pt twice but could not get through to advise him on negative CT result. So will give lpn message to call pt and advise.

## 2014-02-27 NOTE — Progress Notes (Signed)
Pre visit review using our clinic review tool, if applicable. No additional management support is needed unless otherwise documented below in the visit note. 

## 2014-03-05 ENCOUNTER — Telehealth: Payer: Self-pay | Admitting: Medical

## 2014-03-05 NOTE — Telephone Encounter (Signed)
I called pt today. He states he is about the exact same in terms of his light headed sensation. His faint temporal ha is slightly better and mild improvement of sinus pressure. He has no vision changes, no slurred speech. No arms or leg weakness or numbness. No confusion reported. States always feels tired but that is not new. The meclizine is not helping the light headed sensation.  I advised pt to continue with current treatment advised him on last visit. I advised him that I would consult with one of MD I work with or radiologist and we may do more imaging.  During the interim I advised him that if his symptoms worsen such as increasing dizziness or other signs and symptoms(as  Reviewed above) then go to the ED directly tonight. Pt agreed that he would.

## 2014-03-06 ENCOUNTER — Telehealth: Payer: Self-pay | Admitting: Medical

## 2014-03-06 DIAGNOSIS — R42 Dizziness and giddiness: Secondary | ICD-10-CM

## 2014-03-06 NOTE — Telephone Encounter (Signed)
I called pt today around 10:30 am.   He states that he was still dizzy. The same. No new signs or symptoms.I advised him that since he is the same (but not improving) that I decided I wanted him [facility](in event ct missed subtle early process). I explained best to get this done now rather than later. Pt states that he has too many things going on with family coming in town. He states he does not see the need to go in since he is not any worse than before. I explained to him that prior ct scan not always conclusive. Pt tells me he can't do anything until jan 4th due to family obligations.  I explained to him that I  did not recommend further delay and would like to try to schedule for dec 28th but he still says he can't do study then. Since I could not convince him to go today . I advised pt I would go ahead and refer him to Dr. Allena KatzPatel his neurologist  and try to get a MRI scheduled. I did get patient to agree that if he gets any worsening signs or symptoms then go to ED. He agreed he would.

## 2014-03-17 ENCOUNTER — Telehealth: Payer: Self-pay

## 2014-03-17 NOTE — Telephone Encounter (Signed)
[name] [phone] [facility]  [facility] called to say new insurance will cover the [facility] patch and he would like prescription for that. Call when ready for pick up.

## 2014-03-18 MED ORDER — BUPRENORPHINE 10 MCG/HR TD PTWK: 10.0000 ug | MEDICATED_PATCH | TRANSDERMAL | Status: DC

## 2014-03-18 NOTE — Telephone Encounter (Signed)
Patient informed, understood & agreed/SLS  

## 2014-03-18 NOTE — Telephone Encounter (Signed)
Hydrocodone discontinued.  Rx Butrans 10 mg.  Patient can pick up at front desk.

## 2014-03-20 NOTE — Telephone Encounter (Signed)
Pt is yet to return call from neurology

## 2014-03-26 ENCOUNTER — Other Ambulatory Visit: Payer: Self-pay | Admitting: Physician Assistant

## 2014-03-26 DIAGNOSIS — E559 Vitamin D deficiency, unspecified: Secondary | ICD-10-CM

## 2014-03-26 NOTE — Telephone Encounter (Signed)
Medication Detail      Disp Refills Start End     DULoxetine (CYMBALTA) 60 MG capsule 30 capsule 1 01/20/2014     Sig: TAKE 1 CAPSULE BY MOUTH DAILY.    E-Prescribing Status: Receipt confirmed by pharmacy (01/20/2014 12:24 PM EST)      Rx request for Cymbalta to pharmacy; Vit D2 weekly therapy Denied per VO provider, patient needs Vit D level recheck at lab prior to refill authorization/SLS

## 2014-04-02 ENCOUNTER — Other Ambulatory Visit: Payer: Self-pay | Admitting: Physician Assistant

## 2014-04-02 DIAGNOSIS — E559 Vitamin D deficiency, unspecified: Secondary | ICD-10-CM

## 2014-04-02 NOTE — Telephone Encounter (Signed)
Rx request Denied second time, pt must have lab prior to refill authorization for Vit D 50,000 IU therapy per provider instructions/SLS Lab scheduled for Thurs, 01.21.16 at 2:00p; future order placed/sls

## 2014-04-03 ENCOUNTER — Other Ambulatory Visit (INDEPENDENT_AMBULATORY_CARE_PROVIDER_SITE_OTHER): Payer: No Typology Code available for payment source

## 2014-04-03 DIAGNOSIS — E559 Vitamin D deficiency, unspecified: Secondary | ICD-10-CM

## 2014-04-03 LAB — VITAMIN D 25 HYDROXY (VIT D DEFICIENCY, FRACTURES): VITD: 22.35 ng/mL — ABNORMAL LOW (ref 30.00–100.00)

## 2014-04-05 MED ORDER — VITAMIN D (ERGOCALCIFEROL) 1.25 MG (50000 UNIT) PO CAPS
50000.0000 [IU] | ORAL_CAPSULE | ORAL | Status: DC
Start: 2014-04-05 — End: 2014-06-18

## 2014-04-05 NOTE — Telephone Encounter (Signed)
Vitamin D has improved but still very low. Will continue Vitamin D supplementation -- 1 tablet weekly for 3 more months. Rx sent to pharmacy. Will then switch to maintenance dose. Follow-up in 3 months.

## 2014-04-07 NOTE — Telephone Encounter (Signed)
Patient informed, understood & agreed/SLS  

## 2014-04-10 ENCOUNTER — Telehealth: Payer: Self-pay | Admitting: Physician Assistant

## 2014-04-10 NOTE — Telephone Encounter (Signed)
Caller name: Briston Relation to pt: self Call back number: [phone] Pharmacy:  Reason for call:   Patient just put on his last buprenorphine (BUTRANS) patch and would like to pick up rx early next week.

## 2014-04-11 MED ORDER — BUPRENORPHINE 10 MCG/HR TD PTWK: 10.0000 ug | MEDICATED_PATCH | TRANSDERMAL | Status: DC

## 2014-04-11 NOTE — Telephone Encounter (Signed)
Medication refilled.  At front desk for pickup Monday.

## 2014-04-11 NOTE — Telephone Encounter (Signed)
Medication Detail      Disp Refills Start End     buprenorphine (BUTRANS) 10 MCG/HR PTWK patch 4 patch 0 03/18/2014     Sig - Route: Place 1 patch (10 mcg total) onto the skin once a week. - Transdermal    Class: Print     Pharmacy    [facility] [facility] - [place] - [address]     Please Advise on refills/SLS

## 2014-04-14 NOTE — Telephone Encounter (Signed)
LMOM with contact name and number [for return call, if needed] RE: requested medication ready for pick-up during regular business hours per provider instructions/SLS

## 2014-04-22 ENCOUNTER — Ambulatory Visit (INDEPENDENT_AMBULATORY_CARE_PROVIDER_SITE_OTHER): Payer: Medicare Other | Admitting: Physician Assistant

## 2014-04-22 ENCOUNTER — Encounter: Payer: Self-pay | Admitting: Physician Assistant

## 2014-04-22 VITALS — BP 118/66 | HR 83 | Temp 98.3°F | Resp 16 | Ht 66.0 in | Wt 180.5 lb

## 2014-04-22 DIAGNOSIS — L729 Follicular cyst of the skin and subcutaneous tissue, unspecified: Secondary | ICD-10-CM

## 2014-04-22 DIAGNOSIS — L089 Local infection of the skin and subcutaneous tissue, unspecified: Secondary | ICD-10-CM

## 2014-04-22 MED ORDER — DOXYCYCLINE HYCLATE 100 MG PO CAPS: 100.0000 mg | ORAL_CAPSULE | Freq: Two times a day (BID) | ORAL | Status: DC

## 2014-04-22 NOTE — Progress Notes (Signed)
Pre visit review using our clinic review tool, if applicable. No additional management support is needed unless otherwise documented below in the visit note/SLS  

## 2014-04-22 NOTE — Patient Instructions (Signed)
Please take antibiotic as directed.  Keep warm compresses on area to promote drainage.  Apply topical Bacitracin ointment over the area.  You will be contacted by Dermatology for further management and possible removal of cyst.

## 2014-04-27 DIAGNOSIS — L089 Local infection of the skin and subcutaneous tissue, unspecified: Secondary | ICD-10-CM | POA: Insufficient documentation

## 2014-04-27 DIAGNOSIS — L729 Follicular cyst of the skin and subcutaneous tissue, unspecified: Principal | ICD-10-CM

## 2014-04-27 NOTE — Assessment & Plan Note (Signed)
Rx Doxycycline.  Supportive measures discussed.  Giving chronicity of lesion and recurrent infections, will refer to Dermatology for complete excision

## 2014-04-27 NOTE — Progress Notes (Signed)
Patient presents to clinic today c/o chronic cyst of upper left back with intermittent infections.  Endorses flare-up x 1 week with redness, warmth, tenderness and some scant drainage.  Denies fever, chills, fatigue.  Denies lesion elsewhere.  Past Medical History  Diagnosis Date  . Diabetes type 1, uncontrolled   . Frequent headaches   . Heart attack 09.13.2014    x5  . Hyperlipidemia   . Chicken pox   . Mumps   . Measles, [place] (rubella)   . Scarlet fever   . Diabetic retinopathy   . Cataracts, bilateral   . Orthostatic hypotension   . CAD (coronary artery disease)   . Contrast dye induced nephropathy   . OSA (obstructive sleep apnea) 07/17/2013    Current Outpatient Prescriptions on File Prior to Visit  Medication Sig Dispense Refill  . aspirin 81 MG tablet Take 81 mg by mouth daily.    . buprenorphine (BUTRANS) 10 MCG/HR PTWK patch Place 1 patch (10 mcg total) onto the skin once a week. 4 patch 0  . co-enzyme Q-10 30 MG capsule Take 30 mg by mouth daily.     . DOK PLUS 50-8.6 MG per tablet TAKE 1 TABLET BY MOUTH DAILY 100 tablet 0  . DULoxetine (CYMBALTA) 60 MG capsule TAKE 1 CAPSULE BY MOUTH DAILY. 30 capsule 1  . fludrocortisone (FLORINEF) 0.1 MG tablet TAKE 1 TABLET BY MOUTH TWICE DAILY 60 tablet 2  . fluticasone (FLONASE) 50 MCG/ACT nasal spray Place 2 sprays into both nostrils daily. (Patient taking differently: Place 2 sprays into both nostrils daily as needed. ) 16 g 1  . gabapentin (NEURONTIN) 600 MG tablet Take 1 tablet (600 mg total) by mouth 3 (three) times daily. 667m AM, 3075mwith lunch, 60034mM 270 tablet 1  . glucagon 1 MG injection Inject 1 mg into the vein once as needed (Emergency Test Kit).    . gMarland Kitchenucose blood test strip 1 each by Other route as directed. Use as instructed FREESTYLE IN VITRO    . insulin lispro (HUMALOG) 100 UNIT/ML injection Inject into the skin as directed. Use in Insulin Pump, Max daily dose: 100 units/day    . meclizine (ANTIVERT)  12.5 MG tablet Take 1 tablet (12.5 mg total) by mouth 3 (three) times daily as needed for dizziness. 30 tablet 0  . metoCLOPramide (REGLAN) 10 MG tablet Take 1 tablet (10 mg total) by mouth every 8 (eight) hours as needed for nausea. 30 tablet 0  . midodrine (PROAMATINE) 10 MG tablet TAKE 1 TABLET BY MOUTH TWICE DAILY 60 tablet 2  . Vitamin D, Ergocalciferol, (DRISDOL) 50000 UNITS CAPS capsule Take 1 capsule (50,000 Units total) by mouth every 7 (seven) days. 12 capsule 0  . zolpidem (AMBIEN CR) 12.5 MG CR tablet TAKE 1 TABLET BY MOUTH AT BEDTIME 30 tablet 2   No current facility-administered medications on file prior to visit.    No Known Allergies  Family History  Problem Relation Age of Onset  . Vascular Disease Father 96 101 Deceased  . Transient ischemic attack Father   . Hypertension Mother 93 44 Deceased  . Liver cancer Paternal Grandfather   . Breast cancer Sister   . Heart disease Sister     Ablation for rhythm disturbance  . Healthy Brother     x1  . Other Mother     s/p PPM    History   Social History  . Marital Status: Married  Spouse Name: N/A  . Number of Children: 1  . Years of Education: N/A   Social History Main Topics  . Smoking status: Never Smoker   . Smokeless tobacco: Not on file  . Alcohol Use: Yes     Comment: Rare  . Drug Use: No  . Sexual Activity: Not on file   Other Topics Concern  . 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  They have one grown daughter.   Highest level of education:  Master's Degree    Review of Systems - See HPI.  All other ROS are negative.  BP 118/66 mmHg  Pulse 83  Temp(Src) 98.3 F (36.8 C) (Oral)  Resp 16  Ht _0  (1.676 m)  Wt 180 lb 8 oz (81.874 kg)  BMI 29.15 kg/m2  SpO2 100%  Physical Exam  Constitutional: He is oriented to person, place, and time and well-developed, well-nourished, and in no distress.  HENT:  Head: Normocephalic and  atraumatic.  Cardiovascular: Normal rate, regular rhythm, normal heart sounds and intact distal pulses.   Pulmonary/Chest: Effort normal and breath sounds normal. No respiratory distress. He has no wheezes. He has no rales. He exhibits no tenderness.  Neurological: He is alert and oriented to person, place, and time.  Skin: Skin is warm and dry.     Psychiatric: Affect normal.  Vitals reviewed.   Recent Results (from the past 2160 hour(s))  CBC     Status: None   Collection Time: 01/31/14 10:57 AM  Result Value Ref Range   WBC 6.1 4.0 - 10.5 K/uL   RBC 4.93 4.22 - 5.81 Mil/uL   Platelets 235.0 150.0 - 400.0 K/uL   Hemoglobin 14.1 13.0 - 17.0 g/dL   HCT 43.6 39.0 - 52.0 %   MCV 88.6 78.0 - 100.0 fl   MCHC 32.2 30.0 - 36.0 g/dL   RDW 13.7 11.5 - 15.5 %  TSH     Status: None   Collection Time: 01/31/14 10:57 AM  Result Value Ref Range   TSH 2.08 0.35 - 4.50 uIU/mL  Vitamin D (25 hydroxy)     Status: Abnormal   Collection Time: 01/31/14 10:57 AM  Result Value Ref Range   VITD 16.39 (L) 30.00 - 100.00 ng/mL  B12     Status: None   Collection Time: 01/31/14 10:57 AM  Result Value Ref Range   Vitamin B-12 788 211 - 911 pg/mL  Vitamin D (25 hydroxy)     Status: Abnormal   Collection Time: 04/03/14  2:12 PM  Result Value Ref Range   VITD 22.35 (L) 30.00 - 100.00 ng/mL    Assessment/Plan: Infected cyst of skin Rx Doxycycline.  Supportive measures discussed.  Giving chronicity of lesion and recurrent infections, will refer to Dermatology for complete excision

## 2014-04-28 ENCOUNTER — Other Ambulatory Visit: Payer: Self-pay | Admitting: Physician Assistant

## 2014-04-28 NOTE — Telephone Encounter (Signed)
Rx request to pharmacy/SLS  

## 2014-04-30 ENCOUNTER — Other Ambulatory Visit: Payer: Self-pay | Admitting: *Deleted

## 2014-04-30 NOTE — Telephone Encounter (Signed)
Received PA Approval on pt's [facility]artrate [facility] valid 01.01.16 - 12.31.16; faxed to pharmacy/SLS

## 2014-05-08 ENCOUNTER — Other Ambulatory Visit: Payer: Self-pay | Admitting: Physician Assistant

## 2014-05-08 NOTE — Telephone Encounter (Signed)
Caller name:  Timothy Alvarez, Devery Relation to pt: self  Call back number: [phone] Pharmacy: [facility] [place] [facility] - [place], [place] - [address] [phone] (Phone) [phone] (Fax)         Reason for call:  Pt requesting a refill buprenorphine (BUTRANS) 10 MCG/HR PTWK patch and pt indicated RX needs a prior-auth. Please advise

## 2014-05-08 NOTE — Telephone Encounter (Signed)
PA submitted, awaiting determination. JG//CMA  

## 2014-05-08 NOTE — Telephone Encounter (Signed)
Rx's sent to the pharmacy by e-script.//AB/CMA 

## 2014-05-09 MED ORDER — BUPRENORPHINE 10 MCG/HR TD PTWK: 10.0000 ug | MEDICATED_PATCH | TRANSDERMAL | Status: DC

## 2014-05-09 NOTE — Telephone Encounter (Signed)
LMOM with contact name and number [for return call, if needed] RE: what expired on Thurs, 02.25.16 when we were out of the office on his [employer] RX; informed that I have spoken with the pharmacy that does show any necessity for PA and that his Rx is ready for pick-up before closing at 5:00pm today/SLS

## 2014-05-29 ENCOUNTER — Other Ambulatory Visit: Payer: Self-pay | Admitting: Physician Assistant

## 2014-05-29 ENCOUNTER — Other Ambulatory Visit: Payer: Self-pay | Admitting: Neurology

## 2014-05-29 NOTE — Telephone Encounter (Signed)
Rx sent 

## 2014-05-30 ENCOUNTER — Emergency Department (HOSPITAL_BASED_OUTPATIENT_CLINIC_OR_DEPARTMENT_OTHER)
Admission: EM | Admit: 2014-05-30 | Discharge: 2014-05-30 | Disposition: A | Discharge status: Home / Self Care | Payer: Medicare Other | Attending: Emergency Medicine | Admitting: Emergency Medicine

## 2014-05-30 ENCOUNTER — Encounter (HOSPITAL_BASED_OUTPATIENT_CLINIC_OR_DEPARTMENT_OTHER): Payer: Self-pay

## 2014-05-30 ENCOUNTER — Emergency Department (HOSPITAL_BASED_OUTPATIENT_CLINIC_OR_DEPARTMENT_OTHER): Payer: Medicare Other

## 2014-05-30 DIAGNOSIS — Z794 Long term (current) use of insulin: Secondary | ICD-10-CM | POA: Insufficient documentation

## 2014-05-30 DIAGNOSIS — Z7982 Long term (current) use of aspirin: Secondary | ICD-10-CM | POA: Diagnosis not present

## 2014-05-30 DIAGNOSIS — Z9861 Coronary angioplasty status: Secondary | ICD-10-CM | POA: Diagnosis not present

## 2014-05-30 DIAGNOSIS — Z8669 Personal history of other diseases of the nervous system and sense organs: Secondary | ICD-10-CM | POA: Diagnosis not present

## 2014-05-30 DIAGNOSIS — Z79899 Other long term (current) drug therapy: Secondary | ICD-10-CM | POA: Diagnosis not present

## 2014-05-30 DIAGNOSIS — Y9389 Activity, other specified: Secondary | ICD-10-CM | POA: Diagnosis not present

## 2014-05-30 DIAGNOSIS — Y9289 Other specified places as the place of occurrence of the external cause: Secondary | ICD-10-CM | POA: Diagnosis not present

## 2014-05-30 DIAGNOSIS — E785 Hyperlipidemia, unspecified: Secondary | ICD-10-CM | POA: Insufficient documentation

## 2014-05-30 DIAGNOSIS — Z87828 Personal history of other (healed) physical injury and trauma: Secondary | ICD-10-CM | POA: Insufficient documentation

## 2014-05-30 DIAGNOSIS — H269 Unspecified cataract: Secondary | ICD-10-CM | POA: Diagnosis not present

## 2014-05-30 DIAGNOSIS — Y998 Other external cause status: Secondary | ICD-10-CM | POA: Diagnosis not present

## 2014-05-30 DIAGNOSIS — Z8619 Personal history of other infectious and parasitic diseases: Secondary | ICD-10-CM | POA: Insufficient documentation

## 2014-05-30 DIAGNOSIS — I252 Old myocardial infarction: Secondary | ICD-10-CM | POA: Insufficient documentation

## 2014-05-30 DIAGNOSIS — I2510 Atherosclerotic heart disease of native coronary artery without angina pectoris: Secondary | ICD-10-CM | POA: Diagnosis not present

## 2014-05-30 DIAGNOSIS — S6992XA Unspecified injury of left wrist, hand and finger(s), initial encounter: Secondary | ICD-10-CM | POA: Diagnosis present

## 2014-05-30 DIAGNOSIS — S52502A Unspecified fracture of the lower end of left radius, initial encounter for closed fracture: Secondary | ICD-10-CM | POA: Insufficient documentation

## 2014-05-30 DIAGNOSIS — Z7952 Long term (current) use of systemic steroids: Secondary | ICD-10-CM | POA: Insufficient documentation

## 2014-05-30 DIAGNOSIS — S52612A Displaced fracture of left ulna styloid process, initial encounter for closed fracture: Secondary | ICD-10-CM | POA: Insufficient documentation

## 2014-05-30 DIAGNOSIS — Z792 Long term (current) use of antibiotics: Secondary | ICD-10-CM | POA: Diagnosis not present

## 2014-05-30 DIAGNOSIS — Z7951 Long term (current) use of inhaled steroids: Secondary | ICD-10-CM | POA: Diagnosis not present

## 2014-05-30 DIAGNOSIS — E109 Type 1 diabetes mellitus without complications: Secondary | ICD-10-CM | POA: Insufficient documentation

## 2014-05-30 DIAGNOSIS — W101XXA Fall (on)(from) sidewalk curb, initial encounter: Secondary | ICD-10-CM | POA: Insufficient documentation

## 2014-05-30 MED ORDER — OXYCODONE-ACETAMINOPHEN 5-325 MG PO TABS
1.0000 | ORAL_TABLET | Freq: Once | ORAL | Status: AC
  Administered 2014-05-30: 1 via ORAL
  Filled 2014-05-30: qty 1

## 2014-05-30 MED ORDER — OXYCODONE-ACETAMINOPHEN 5-325 MG PO TABS: 1.0000 | ORAL_TABLET | Freq: Four times a day (QID) | ORAL | Status: DC | PRN

## 2014-05-30 NOTE — Discharge Instructions (Signed)
Forearm Fracture °Your caregiver has diagnosed you as having a broken bone (fracture) of the forearm. This is the part of your arm between the elbow and your wrist. Your forearm is made up of two bones. These are the radius and ulna. A fracture is a break in one or both bones. A cast or splint is used to protect and keep your injured bone from moving. The cast or splint will be on generally for about 5 to 6 weeks, with individual variations. °HOME CARE INSTRUCTIONS  °· Keep the injured part elevated while sitting or lying down. Keeping the injury above the level of your heart (the center of the chest). This will decrease swelling and pain. °· Apply ice to the injury for 15-20 minutes, 03-04 times per day while awake, for 2 days. Put the ice in a plastic bag and place a thin towel between the bag of ice and your cast or splint. °· If you have a plaster or fiberglass cast: °¨ Do not try to scratch the skin under the cast using sharp or pointed objects. °¨ Check the skin around the cast every day. You may put lotion on any red or sore areas. °¨ Keep your cast dry and clean. °· If you have a plaster splint: °¨ Wear the splint as directed. °¨ You may loosen the elastic around the splint if your fingers become numb, tingle, or turn cold or blue. °· Do not put pressure on any part of your cast or splint. It may break. Rest your cast only on a pillow the first 24 hours until it is fully hardened. °· Your cast or splint can be protected during bathing with a plastic bag. Do not lower the cast or splint into water. °· Only take over-the-counter or prescription medicines for pain, discomfort, or fever as directed by your caregiver. °SEEK IMMEDIATE MEDICAL CARE IF:  °· Your cast gets damaged or breaks. °· You have more severe pain or swelling than you did before the cast. °· Your skin or nails below the injury turn blue or gray, or feel cold or numb. °· There is a bad smell or new stains and/or pus like (purulent) drainage  coming from under the cast. °MAKE SURE YOU:  °· Understand these instructions. °· Will watch your condition. °· Will get help right away if you are not doing well or get worse. °Document Released: 02/26/2000 Document Revised: 05/23/2011 Document Reviewed: 10/18/2007 °Exit[employer]® Patient Information ©2015 [employer]. This information is not intended to replace advice given to you by your health care provider. Make sure you discuss any questions you have with your health care provider. ° °

## 2014-05-30 NOTE — ED Provider Notes (Signed)
CSN: [id number]     Arrival date & time 05/30/14  1239 History   First MD Initiated Contact with Patient 05/30/14 1323     Chief Complaint  Patient presents with  . Wrist Injury     (Consider location/radiation/quality/duration/timing/severity/associated sxs/prior Treatment) Patient is a 59 y.o. male presenting with wrist injury. The history is provided by the patient.  Wrist Injury Associated symptoms: no back pain, no fever and no neck pain    patient with wrist injury. Tripped and fell landing on his outstretched hand on the left side. Only pain on the Wrist. No elbow or shoulder pain. Skin intact. No previous wrist injury on this side. States he did not hit his head. No neck chest or abdominal pain.  Past Medical History  Diagnosis Date  . Diabetes type 1, uncontrolled   . Frequent headaches   . Heart attack 09.13.2014    x5  . Hyperlipidemia   . Chicken pox   . Mumps   . Measles, [place] (rubella)   . Scarlet fever   . Diabetic retinopathy   . Cataracts, bilateral   . Orthostatic hypotension   . CAD (coronary artery disease)   . Contrast dye induced nephropathy   . OSA (obstructive sleep apnea) 07/17/2013   Past Surgical History  Procedure Laterality Date  . Coronary angioplasty with stent placement    . Appendectomy  1966  . Wisdom tooth extraction    . Refractive surgery      Retinopathy   Family History  Problem Relation Age of Onset  . Vascular Disease Father 68    Deceased  . Transient ischemic attack Father   . Hypertension Mother 68    Deceased  . Liver cancer Paternal Grandfather   . Breast cancer Sister   . Heart disease Sister     Ablation for rhythm disturbance  . Healthy Brother     x1  . Other Mother     s/p PPM   History  Substance Use Topics  . Smoking status: Never Smoker   . Smokeless tobacco: Not on file  . Alcohol Use: Yes     Comment: Rare    Review of Systems  Constitutional: Negative for fever and appetite change.  HENT:  Negative for drooling.   Eyes: Negative for pain.  Respiratory: Negative for wheezing.   Musculoskeletal: Positive for joint swelling. Negative for back pain, gait problem and neck pain.  Skin: Negative for wound.  Neurological: Negative for syncope.  Psychiatric/Behavioral: Negative for confusion.      Allergies  Review of patient's allergies indicates no known allergies.  Home Medications   Prior to Admission medications   Medication Sig Start Date End Date Taking? Authorizing Provider  aspirin 81 MG tablet Take 81 mg by mouth daily.    Historical Provider, MD  buprenorphine (BUTRANS) 10 MCG/HR PTWK patch Place 1 patch (10 mcg total) onto the skin once a week. 05/09/14   Brunetta Jeans, PA-C  co-enzyme Q-10 30 MG capsule Take 30 mg by mouth daily.     Historical Provider, MD  DOC-Q-LAX 8.6-50 MG per tablet TAKE 1 TABLET BY MOUTH DAILY 04/28/14   Brunetta Jeans, PA-C  doxycycline (VIBRAMYCIN) 100 MG capsule Take 1 capsule (100 mg total) by mouth 2 (two) times daily. 04/22/14   Brunetta Jeans, PA-C  DULoxetine (CYMBALTA) 60 MG capsule TAKE 1 CAPSULE BY MOUTH DAILY. 05/29/14   Brunetta Jeans, PA-C  fludrocortisone (FLORINEF) 0.1 MG tablet TAKE 1 TABLET  BY MOUTH TWICE DAILY 05/08/14   Brunetta Jeans, PA-C  fluticasone [facility]) 50 MCG/ACT nasal spray Place 2 sprays into both nostrils daily. Patient taking differently: Place 2 sprays into both nostrils daily as needed.  02/27/14   Meriam Sprague Saguier, PA-C  gabapentin (NEURONTIN) 600 MG tablet Take 1 tablet (600 mg total) by mouth 3 (three) times daily. 652m AM, 3060mwith lunch, 60034mM 01/01/14   WilBrunetta JeansA-C  gabapentin (NEURONTIN) 600 MG tablet TAKE 1 TABLET (600 MG TOTAL) BY MOUTH 3 (THREE) TIMES DAILY. 05/29/14   Donika K Patel, DO  glucagon 1 MG injection Inject 1 mg into the vein once as needed (Emergency Test Kit).    Historical Provider, MD  glucose blood test strip 1 each by Other route as directed. Use as instructed  FREESTYLE IN VITRO    Historical Provider, MD  insulin lispro (HUMALOG) 100 UNIT/ML injection Inject into the skin as directed. Use in Insulin Pump, Max daily dose: 100 units/day    Historical Provider, MD  meclizine (ANTIVERT) 12.5 MG tablet Take 1 tablet (12.5 mg total) by mouth 3 (three) times daily as needed for dizziness. 02/27/14   EdwMeriam Spragueguier, PA-C  metoCLOPramide (REGLAN) 10 MG tablet Take 1 tablet (10 mg total) by mouth every 8 (eight) hours as needed for nausea. 01/06/14   WilBrunetta JeansA-C  midodrine (PROAMATINE) 10 MG tablet TAKE 1 TABLET BY MOUTH TWICE DAILY 05/08/14   WilBrunetta JeansA-C  oxyCODONE-acetaminophen (PERCOCET/ROXICET) 5-325 MG per tablet Take 1-2 tablets by mouth every 6 (six) hours as needed for moderate pain or severe pain. 05/30/14   NatDavonna BellingD  Vitamin D, Ergocalciferol, (DRISDOL) 50000 UNITS CAPS capsule Take 1 capsule (50,000 Units total) by mouth every 7 (seven) days. 04/05/14   WilBrunetta JeansA-C  zolpidem (AMBIEN CR) 12.5 MG CR tablet TAKE 1 TABLET BY MOUTH AT BEDTIME 02/14/14   WilBrunetta JeansA-C   BP 113/70 mmHg  Pulse 68  Temp(Src) 97.9 F (36.6 C) (Oral)  Resp 18  Ht 5' 6"  (1.676 m)  Wt 180 lb (81.647 kg)  BMI 29.07 kg/m2  SpO2 98% Physical Exam  Constitutional: He appears well-developed.  HENT:  Head: Atraumatic.  Cardiovascular: Normal rate and regular rhythm.   Pulmonary/Chest: Effort normal.  Musculoskeletal: He exhibits tenderness.  Tenderness to distal left wrist. There is some dorsal angulation of the wrist. Skin is intact. Good sensation over radial median and ulnar distribution of the hands. Radial pulse intact. Wedding ring is in place with some swelling around it and will be removed.  Neurological: He is alert.  Skin: There is erythema.    ED Course  Procedures (including critical care time) Labs Review Labs Reviewed - No data to display  Imaging Review Dg Wrist Complete Left  05/30/2014   CLINICAL  DATA:  Fall with left wrist pain and swelling. Initial encounter.  EXAM: LEFT WRIST - COMPLETE 3+ VIEW  COMPARISON:  None.  FINDINGS: Transverse extra-articular fracture through the distal radial metaphysis with posterior impaction causing wrist tilting and posterior displacement by 3 mm. There is a nondisplaced ulnar styloid process fracture.  Radiocarpal alignment is maintained.  Prominent osteopenia for age.  IMPRESSION: 1. Extra-articular distal radius fracture with impaction and displacement. 2. Ulnar styloid fracture.   Electronically Signed   By: JonMonte FantasiaD.   On: 05/30/2014 13:43     EKG Interpretation None      MDM   Final  diagnoses:  Distal radius fracture, left, closed, initial encounter  Fracture of ulnar styloid, left, closed, initial encounter    Patient with distal radius and ulnar styloid fracture. Closed. Discussed with Dr. Grandville Silos, who reviewed the x-rays will see the patient follow-up. Patient was splinted    Davonna Belling, MD 05/30/14 1530

## 2014-05-30 NOTE — ED Notes (Signed)
Pt reports stepping off of a curb and falling today - reports left wrist pain, edema. Denies hitting head.

## 2014-06-04 ENCOUNTER — Telehealth: Payer: Self-pay | Admitting: Physician Assistant

## 2014-06-04 MED ORDER — BUPRENORPHINE 10 MCG/HR TD PTWK: 10.0000 ug | MEDICATED_PATCH | TRANSDERMAL | Status: DC

## 2014-06-04 NOTE — Telephone Encounter (Signed)
Medication Detail      Disp Refills Start End     buprenorphine (BUTRANS) 10 MCG/HR PTWK patch 4 patch 0 05/09/2014     Sig - Route: Place 1 patch (10 mcg total) onto the skin once a week. - Transdermal    Class: Print   Pharmacy    [facility] [facility] [place] [address]

## 2014-06-04 NOTE — Telephone Encounter (Signed)
Caller name: Ignacia BayleyWebb, Equan Relation to pt: self  Call back number: [phone]   Reason for call:  Pt requesting a refill buprenorphine (BUTRANS) 10 MCG/HR PTWK patch

## 2014-06-04 NOTE — Telephone Encounter (Signed)
Rx at front desk for pick up.

## 2014-06-04 NOTE — Telephone Encounter (Signed)
Patient informed, understood & agreed; informed that office will be closed on Friday for Holiday/SLS

## 2014-06-16 ENCOUNTER — Other Ambulatory Visit: Payer: Self-pay | Admitting: Cardiology

## 2014-06-18 ENCOUNTER — Other Ambulatory Visit: Payer: Self-pay | Admitting: Physician Assistant

## 2014-06-19 NOTE — Telephone Encounter (Signed)
I have sent in the Rx with the new dosing instructions.  Please call patient and let him know I will recheck his Vitamin D level at next visit.  Thank you.

## 2014-06-19 NOTE — Telephone Encounter (Signed)
Requesting refill on Ergocalciferol, did you want Pt to continue taking or stop at 12 week mark? Please advise.

## 2014-06-20 ENCOUNTER — Other Ambulatory Visit: Payer: Self-pay | Admitting: *Deleted

## 2014-06-20 MED ORDER — ZOLPIDEM TARTRATE ER 12.5 MG PO TBCR: 12.5000 mg | EXTENDED_RELEASE_TABLET | Freq: Every day | ORAL | Status: DC

## 2014-06-20 NOTE — Telephone Encounter (Signed)
OK to phone in refill to pharmacy.  Will get CSC at next visit.

## 2014-06-20 NOTE — Telephone Encounter (Signed)
Rx printed, awaiting signature. 

## 2014-06-20 NOTE — Telephone Encounter (Signed)
Rx faxed to pharmacy/SLS 

## 2014-06-20 NOTE — Telephone Encounter (Signed)
Requesting Zolpidem Tart ER 12.5mg -Take 1 tablet by mouth at bedtime. Refill:02-14-14;#30,2  Last OV:04-22-14 UDS:12-16-13-Low risk-next:06-17-14-needs CSC signed. Please advise.//AB/CMA

## 2014-07-02 ENCOUNTER — Telehealth: Payer: Self-pay | Admitting: Physician Assistant

## 2014-07-02 MED ORDER — BUPRENORPHINE 10 MCG/HR TD PTWK: 10.0000 ug | MEDICATED_PATCH | TRANSDERMAL | Status: DC

## 2014-07-02 NOTE — Telephone Encounter (Signed)
Relation to pt: self  Call back number:[phone]   Reason for call:  Pt requesting a refill buprenorphine (BUTRANS) 10 MCG/HR PTWK patch

## 2014-07-02 NOTE — Telephone Encounter (Signed)
Patient informed, understood & agreed/SLS  

## 2014-07-02 NOTE — Telephone Encounter (Signed)
Medication Detail      Disp Refills Start End     buprenorphine (BUTRANS) 10 MCG/HR PTWK patch 4 patch 0 06/04/2014     Sig - Route: Place 1 patch (10 mcg total) onto the skin once a week. - Transdermal    Class: Print   Pharmacy    [facility] [facility] - [place] [address]   Rx printed for provider signature/SLS

## 2014-07-03 ENCOUNTER — Other Ambulatory Visit: Payer: Self-pay | Admitting: Neurology

## 2014-07-03 ENCOUNTER — Telehealth: Payer: Self-pay | Admitting: *Deleted

## 2014-07-03 NOTE — Telephone Encounter (Signed)
Called pharmacy and let them know that this is not our patient.

## 2014-07-03 NOTE — Telephone Encounter (Signed)
Pharmacy called back and said that Rx was originally from Dr. Allena KatzPatel.  I ok'd to fill it but patient will have to be seen before any more refills.

## 2014-07-21 ENCOUNTER — Ambulatory Visit: Payer: Medicare Other | Attending: Orthopedic Surgery | Admitting: Physical Therapy

## 2014-07-21 ENCOUNTER — Encounter: Payer: Self-pay | Admitting: Physical Therapy

## 2014-07-21 DIAGNOSIS — R29898 Other symptoms and signs involving the musculoskeletal system: Secondary | ICD-10-CM | POA: Insufficient documentation

## 2014-07-21 DIAGNOSIS — M25532 Pain in left wrist: Secondary | ICD-10-CM | POA: Diagnosis not present

## 2014-07-21 DIAGNOSIS — M25632 Stiffness of left wrist, not elsewhere classified: Secondary | ICD-10-CM | POA: Insufficient documentation

## 2014-07-21 NOTE — Therapy (Signed)
[facility] [place] [address]  [address] [place], [place], [address] Phone: [phone]   Fax:  [phone]  Physical Therapy Evaluation  Patient Details  Name: Timothy Alvarez MRN: [id number] Date of Birth: 12/09/1955 Referring Provider:  Mack Hookhompson, David, MD  Encounter Date: 07/21/2014      PT End of Session - 07/21/14 1514    Visit Number 1   Number of Visits 12   Date for PT Re-Evaluation 09/01/14   PT Start Time 1456   PT Stop Time 1540   PT Time Calculation (min) 44 min      Past Medical History  Diagnosis Date  . Diabetes type 1, uncontrolled   . Frequent headaches   . Heart attack 09.13.2014    x5  . Hyperlipidemia   . Chicken pox   . Mumps   . Measles, [place] (rubella)   . Scarlet fever   . Diabetic retinopathy   . Cataracts, bilateral   . Orthostatic hypotension   . CAD (coronary artery disease)   . Contrast dye induced nephropathy   . OSA (obstructive sleep apnea) 07/17/2013    Past Surgical History  Procedure Laterality Date  . Coronary angioplasty with stent placement    . Appendectomy  1966  . Wisdom tooth extraction    . Refractive surgery      Retinopathy    There were no vitals filed for this visit.  Visit Diagnosis:  Wrist stiffness, left - Plan: PT plan of care cert/re-cert  Wrist pain, acute, left - Plan: PT plan of care cert/re-cert  Wrist weakness - Plan: PT plan of care cert/re-cert      Subjective Assessment - 07/21/14 1504    Subjective pt here for PT initial eval and treatment due to s/p L distal radius fracture on 05/30/14.  He states he did not see a curb as he was walking and fell backwards onto buttocks and B extended wrists.  Notes dull ache in L at times and increased pain if uses L Hand (pt is R hand dominant).  He progressed from cast to splint on 07/14/14.   Patient Stated Goals obtain maximum mobility to L wrist   Currently in Pain? Yes   Pain Score --  0/10 at rest, 3-4/10  on AVG with activity, 8/10 at worst   Pain Location Wrist   Pain Orientation Left   Pain Onset More than a month ago   Pain Frequency Intermittent   Aggravating Factors  using L Hand (especially overhead and with UD activities)   Multiple Pain Sites No            [facility] PT Assessment - 07/21/14 0001    Assessment   Medical Diagnosis L Distal Radius Fx   Onset Date 05/30/14   Balance Screen   Has the patient fallen in the past 6 months Yes   How many times? 1   Has the patient had a decrease in activity level because of a fear of falling?  No   Is the patient reluctant to leave their home because of a fear of falling?  No   Observation/Other Assessments   Focus on Therapeutic Outcomes (FOTO)  56% limitation   ROM / Strength   AROM / PROM / Strength PROM;Strength   PROM   PROM Assessment Site Wrist;Forearm   Right/Left Forearm Right;Left   Left Forearm Pronation 60 Degrees   Left Forearm Supination 62 Degrees   Right/Left Wrist Right;Left  Right Wrist Extension 64 Degrees   Right Wrist Flexion 70 Degrees   Right Wrist Radial Deviation 18 Degrees   Right Wrist Ulnar Deviation 60 Degrees   Left Wrist Extension 50 Degrees   Left Wrist Flexion 45 Degrees   Left Wrist Radial Deviation 23 Degrees   Left Wrist Ulnar Deviation 30 Degrees   Strength   Overall Strength Comments L Hand Grip 0-7# with dynmometer.  R is around 30#.       TODAY'S TREATMENT: TherEx - Stretch L wrist all planes for HEP instruction Manual - L wrist distraction and with grade 2 and some 3 AP and PA mobes          PT Education - 31-Jul-2014 1644    Education provided Yes   Education Details Wrist and forearm self-stretches   Person(s) Educated Patient   Methods Explanation;Demonstration   Comprehension Verbalized understanding;Returned demonstration          PT Short Term Goals - 2014-07-31 1641    PT SHORT TERM GOAL #1   Title pt independent with initial HEP by 07/28/14   Status New            PT Long Term Goals - Jul 31, 2014 1641    PT LONG TERM GOAL #1   Title L wrist and forearm PROM and AROM within 10% of R all planes by 09/01/14   Status New   PT LONG TERM GOAL #2   Title L Grip at least 85% of R by 09/01/14   Status New   PT LONG TERM GOAL #3   Title pt able to use L UE with all ADLs and chores without restriction by pain, weakness, or LOM by 09/01/14   Status New               Plan - 2014/07/31 1637    Clinical Impression Statement pt s/p L distal radius fracture currently with limited wrist and forearm PROM and grip strength due to joint stiffness, edema, and tendon shortening.  Initial focus will be on mobility and will progress to strengthening and function once ROM is [facility].   Pt will benefit from skilled therapeutic intervention in order to improve on the following deficits Pain;Impaired UE functional use;Decreased mobility;Decreased strength;Decreased range of motion;Increased edema   Rehab Potential Good   PT Frequency 2x / week   PT Duration 6 weeks   PT Treatment/Interventions Manual techniques;Therapeutic exercise;Therapeutic activities;Patient/family education;ADLs/Self Care Home Management;Electrical Stimulation;Cryotherapy   PT Next Visit Plan manual for ROM, stretching for ROM, modalities for pain PRN.  Progress to strengthening once ROM is [facility].   Consulted and Agree with Plan of Care Patient          G-Codes - 2014-07-31 1643    Functional Assessment Tool Used FOTO 56% limitation   Functional Limitation Carrying, moving and handling objects   Carrying, Moving and Handling Objects Current Status [phone]) At least 40 percent but less than 60 percent impaired, limited or restricted   Carrying, Moving and Handling Objects Goal Status (U0454) At least 20 percent but less than 40 percent impaired, limited or restricted       Problem List Patient Active Problem List   Diagnosis Date Noted  . Infected cyst of skin 04/27/2014  . Sinusitis, acute  maxillary 02/27/2014  . Dizziness 02/27/2014  . Chronic fatigue 02/03/2014  . DOE (dyspnea on exertion) 11/24/2013  . OSA (obstructive sleep apnea) 07/17/2013  . Bruit 05/08/2013  . 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  Abnormal laboratory test result 04/13/2013  . CAP (community acquired pneumonia) 04/13/2013  . Chronic pain syndrome 04/07/2013  . CAD (coronary artery disease) 04/07/2013  . Neuropathy 04/07/2013  . Orthostatic hypotension 04/07/2013  . Diabetes mellitus type 1 04/07/2013  . Depression 04/07/2013  . Insomnia 04/07/2013  . Other and unspecified hyperlipidemia 04/07/2013  . Encounter to establish care 04/07/2013    [facility] JacksonvilleALL,Justina Bertini PT, OCS 07/21/2014, 4:51 PM  [facility] [place] [address]  [address] [place], [place], [address] Phone: [phone]   Fax:  [phone]

## 2014-07-23 ENCOUNTER — Ambulatory Visit: Payer: Medicare Other | Admitting: Rehabilitation

## 2014-07-23 DIAGNOSIS — M25632 Stiffness of left wrist, not elsewhere classified: Secondary | ICD-10-CM

## 2014-07-23 DIAGNOSIS — M25532 Pain in left wrist: Secondary | ICD-10-CM

## 2014-07-23 DIAGNOSIS — R29898 Other symptoms and signs involving the musculoskeletal system: Secondary | ICD-10-CM

## 2014-07-23 NOTE — Therapy (Signed)
[facility] [place] [address]  [address] [place], [place], [address] Phone: [phone]   Fax:  [phone]  Physical Therapy Treatment  Patient Details  Name: Timothy Alvarez MRN: [id number] Date of Birth: 01-Nov-1955 Referring Provider:  Mack Hookhompson, David, MD  Encounter Date: 07/23/2014      PT End of Session - 07/23/14 1450    Visit Number 2   Number of Visits 12   Date for PT Re-Evaluation 09/01/14   PT Start Time 1445   PT Stop Time 1525   PT Time Calculation (min) 40 min      Past Medical History  Diagnosis Date  . Diabetes type 1, uncontrolled   . Frequent headaches   . Heart attack 09.13.2014    x5  . Hyperlipidemia   . Chicken pox   . Mumps   . Measles, [place] (rubella)   . Scarlet fever   . Diabetic retinopathy   . Cataracts, bilateral   . Orthostatic hypotension   . CAD (coronary artery disease)   . Contrast dye induced nephropathy   . OSA (obstructive sleep apnea) 07/17/2013    Past Surgical History  Procedure Laterality Date  . Coronary angioplasty with stent placement    . Appendectomy  1966  . Wisdom tooth extraction    . Refractive surgery      Retinopathy    There were no vitals filed for this visit.  Visit Diagnosis:  Wrist stiffness, left  Wrist pain, acute, left  Wrist weakness      Subjective Assessment - 07/23/14 1448    Subjective Feeling good today and not wearing the brace. Did ice earlier due to it looking puffy.    Currently in Pain? No/denies            TODAY'S TREATMENT: TherEx -  PROM all directions to pt tolerance with prolonged stretches Then PROM with gentle distraction to pt tolerance  Wrist flexion and extension stretch with 2# weight and wrist off table 3x15" each  Wrist radial deviation stretch with 2# weight and wrist off table 3x15"  Manual - L wrist distraction and with grade II-III AP and PA mobes Distal radioulnar mobs to tolerance, grade I-II.   STM  to anterior and posterior wrist to help decrease edema and loosen tissue.            PT Short Term Goals - 07/23/14 1538    PT SHORT TERM GOAL #1   Title pt independent with initial HEP by 07/28/14   Status Achieved           PT Long Term Goals - 07/23/14 1451    PT LONG TERM GOAL #1   Title L wrist and forearm PROM and AROM within 10% of R all planes by 09/01/14   Status On-going   PT LONG TERM GOAL #2   Title L Grip at least 85% of R by 09/01/14   Status On-going   PT LONG TERM GOAL #3   Title pt able to use L UE with all ADLs and chores without restriction by pain, weakness, or LOM by 09/01/14   Status On-going               Plan - 07/23/14 1537    Clinical Impression Statement Good tolerance to stretch and mobilizations with minimal pain reports. Pt is Independent with original HEP. Focused on ROM today.         Problem List Patient Active Problem List  Diagnosis Date Noted  . Infected cyst of skin 04/27/2014  . Sinusitis, acute maxillary 02/27/2014  . Dizziness 02/27/2014  . Chronic fatigue 02/03/2014  . DOE (dyspnea on exertion) 11/24/2013  . OSA (obstructive sleep apnea) 07/17/2013  . Bruit 05/08/2013  . 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 Abnormal laboratory test result 04/13/2013  . CAP (community acquired pneumonia) 04/13/2013  . Chronic pain syndrome 04/07/2013  . CAD (coronary artery disease) 04/07/2013  . Neuropathy 04/07/2013  . Orthostatic hypotension 04/07/2013  . Diabetes mellitus type 1 04/07/2013  . Depression 04/07/2013  . Insomnia 04/07/2013  . Other and unspecified hyperlipidemia 04/07/2013  . Encounter to establish care 04/07/2013    Ronney LionDUCKER, Dontavian Marchi J, PTA 07/23/2014, 3:42 PM  [facility] [place] [address]  [address] [place], [place], [address] Phone: [phone]   Fax:  [phone]

## 2014-07-28 ENCOUNTER — Ambulatory Visit: Payer: Medicare Other | Admitting: Rehabilitation

## 2014-07-28 DIAGNOSIS — M25632 Stiffness of left wrist, not elsewhere classified: Secondary | ICD-10-CM

## 2014-07-28 DIAGNOSIS — R29898 Other symptoms and signs involving the musculoskeletal system: Secondary | ICD-10-CM

## 2014-07-28 DIAGNOSIS — M25532 Pain in left wrist: Secondary | ICD-10-CM

## 2014-07-28 NOTE — Therapy (Signed)
[facility] [place] [address]  [address] [place], [place], [address] Phone: [phone]   Fax:  [phone]  Physical Therapy Treatment  Patient Details  Name: Timothy Alvarez MRN: [id number] Date of Birth: 1955/09/28 Referring Provider:  Mack Hookhompson, David, MD  Encounter Date: 07/28/2014      PT End of Session - 07/28/14 1439    Visit Number 3   Number of Visits 12   Date for PT Re-Evaluation 09/01/14   PT Start Time 1405   PT Stop Time 1447   PT Time Calculation (min) 42 min   Activity Tolerance Patient tolerated treatment well      Past Medical History  Diagnosis Date  . Diabetes type 1, uncontrolled   . Frequent headaches   . Heart attack 09.13.2014    x5  . Hyperlipidemia   . Chicken pox   . Mumps   . Measles, [place] (rubella)   . Scarlet fever   . Diabetic retinopathy   . Cataracts, bilateral   . Orthostatic hypotension   . CAD (coronary artery disease)   . Contrast dye induced nephropathy   . OSA (obstructive sleep apnea) 07/17/2013    Past Surgical History  Procedure Laterality Date  . Coronary angioplasty with stent placement    . Appendectomy  1966  . Wisdom tooth extraction    . Refractive surgery      Retinopathy    There were no vitals filed for this visit.  Visit Diagnosis:  Wrist stiffness, left  Wrist pain, acute, left  Wrist weakness      Subjective Assessment - 07/28/14 1406    Subjective brace only at night now. reports maybe increased flexibility at the wrist.    Currently in Pain? Yes   Pain Score 3    Pain Location Wrist   Pain Orientation Left   Aggravating Factors  using the hand   Pain Relieving Factors rest, ice                                   PT Short Term Goals - 07/23/14 1538    PT SHORT TERM GOAL #1   Title pt independent with initial HEP by 07/28/14   Status Achieved           PT Long Term Goals - 07/23/14 1451    PT LONG TERM GOAL  #1   Title L wrist and forearm PROM and AROM within 10% of R all planes by 09/01/14   Status On-going   PT LONG TERM GOAL #2   Title L Grip at least 85% of R by 09/01/14   Status On-going   PT LONG TERM GOAL #3   Title pt able to use L UE with all ADLs and chores without restriction by pain, weakness, or LOM by 09/01/14   Status On-going               Plan - 07/28/14 1439    Clinical Impression Statement ROM and gentle strength focus today   PT Next Visit Plan manual for ROM, stretching for ROM, modalities for pain PRN.  Progress to strengthening once ROM is [facility].   Consulted and Agree with Plan of Care Patient        Problem List Patient Active Problem List   Diagnosis Date Noted  . Infected cyst of skin 04/27/2014  . Sinusitis, acute maxillary 02/27/2014  .  Dizziness 02/27/2014  . Chronic fatigue 02/03/2014  . DOE (dyspnea on exertion) 11/24/2013  . OSA (obstructive sleep apnea) 07/17/2013  . Bruit 05/08/2013  . 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 Abnormal laboratory test result 04/13/2013  . CAP (community acquired pneumonia) 04/13/2013  . Chronic pain syndrome 04/07/2013  . CAD (coronary artery disease) 04/07/2013  . Neuropathy 04/07/2013  . Orthostatic hypotension 04/07/2013  . Diabetes mellitus type 1 04/07/2013  . Depression 04/07/2013  . Insomnia 04/07/2013  . Other and unspecified hyperlipidemia 04/07/2013  . Encounter to establish care 04/07/2013     TODAY'S TREATMENT: TherEx -  PROM all directions to pt tolerance with prolonged stretches Then PROM with gentle distraction to pt tolerance  Wrist extension AROM x 20 and stretches into ext 2x30" Elbow/wrist: pro/sup AROM x 20 each and AAROM for ROM Putty grip work x 60" and given for home use; education on use Prayer stretch 2x30" flexion and extension Strengthening gripper 15# x 20  Manual - L wrist distraction and with grade II-III AP and PA mobes Distal radioulnar mobs to tolerance,  grade I-II.  STM to anterior and posterior wrist to help decrease edema and loosen tissue  Ice x10' R wrist   Idamae Lusherevis, Zareen Jamison R, DPT, CMP 07/28/2014, 2:41 PM  [facility] [place] [address]  [address] [place], [place], [address] Phone: [phone]   Fax:  [phone]

## 2014-07-29 ENCOUNTER — Other Ambulatory Visit: Payer: Self-pay | Admitting: Physician Assistant

## 2014-07-31 ENCOUNTER — Ambulatory Visit: Payer: Medicare Other | Admitting: Rehabilitation

## 2014-07-31 DIAGNOSIS — R29898 Other symptoms and signs involving the musculoskeletal system: Secondary | ICD-10-CM

## 2014-07-31 DIAGNOSIS — M25632 Stiffness of left wrist, not elsewhere classified: Secondary | ICD-10-CM

## 2014-07-31 DIAGNOSIS — M25532 Pain in left wrist: Secondary | ICD-10-CM

## 2014-07-31 NOTE — Therapy (Signed)
[facility] [place] [address]  [address] [place], [place], [address] Phone: [phone]   Fax:  [phone]  Physical Therapy Treatment  Patient Details  Name: Timothy Alvarez MRN: [id number] Date of Birth: Jul 10, 1955 Referring Provider:  Mack Hookhompson, David, MD  Encounter Date: 07/31/2014      PT End of Session - 07/31/14 1451    Visit Number 4   Number of Visits 12   Date for PT Re-Evaluation 09/01/14   PT Start Time 1446   PT Stop Time 1528   PT Time Calculation (min) 42 min      Past Medical History  Diagnosis Date  . Diabetes type 1, uncontrolled   . Frequent headaches   . Heart attack 09.13.2014    x5  . Hyperlipidemia   . Chicken pox   . Mumps   . Measles, [place] (rubella)   . Scarlet fever   . Diabetic retinopathy   . Cataracts, bilateral   . Orthostatic hypotension   . CAD (coronary artery disease)   . Contrast dye induced nephropathy   . OSA (obstructive sleep apnea) 07/17/2013    Past Surgical History  Procedure Laterality Date  . Coronary angioplasty with stent placement    . Appendectomy  1966  . Wisdom tooth extraction    . Refractive surgery      Retinopathy    There were no vitals filed for this visit.  Visit Diagnosis:  Wrist stiffness, left  Wrist pain, acute, left  Wrist weakness      Subjective Assessment - 07/31/14 1450    Subjective Reports being sore/tender since last time. Says he also thinks his wrist is swollen so he has been icing it.    Currently in Pain? Yes   Pain Score 4    Pain Location Wrist   Pain Orientation Left   Pain Descriptors / Indicators Aching;Tender      TODAY'S TREATMENT: TherEx -  PROM all directions to pt tolerance with prolonged stretches Then PROM with gentle distraction to pt tolerance  Wrist extension AROM x 20 and and AAROM Elbow/wrist: pro/sup AROM x 20 each and AAROM for ROM Prayer stretch 3"x10 flexion and extension  Manual - L wrist  distraction and with grade II-III AP and PA mobes Distal radioulnar mobs to tolerance, grade I-II STM to anterior and posterior wrist to help decrease edema, loosen tissue, and decrease tenderness                             PT Short Term Goals - 07/23/14 1538    PT SHORT TERM GOAL #1   Title pt independent with initial HEP by 07/28/14   Status Achieved           PT Long Term Goals - 07/23/14 1451    PT LONG TERM GOAL #1   Title L wrist and forearm PROM and AROM within 10% of R all planes by 09/01/14   Status On-going   PT LONG TERM GOAL #2   Title L Grip at least 85% of R by 09/01/14   Status On-going   PT LONG TERM GOAL #3   Title pt able to use L UE with all ADLs and chores without restriction by pain, weakness, or LOM by 09/01/14   Status On-going               Plan - 07/31/14 1519    Clinical Impression  Statement Limited tolerance to exercise today due to higher pain levels.    PT Next Visit Plan manual for ROM, stretching for ROM, modalities for pain PRN.  Progress to strengthening once ROM is [facility].   Consulted and Agree with Plan of Care Patient        Problem List Patient Active Problem List   Diagnosis Date Noted  . Infected cyst of skin 04/27/2014  . Sinusitis, acute maxillary 02/27/2014  . Dizziness 02/27/2014  . Chronic fatigue 02/03/2014  . DOE (dyspnea on exertion) 11/24/2013  . OSA (obstructive sleep apnea) 07/17/2013  . Bruit 05/08/2013  . 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 Abnormal laboratory test result 04/13/2013  . CAP (community acquired pneumonia) 04/13/2013  . Chronic pain syndrome 04/07/2013  . CAD (coronary artery disease) 04/07/2013  . Neuropathy 04/07/2013  . Orthostatic hypotension 04/07/2013  . Diabetes mellitus type 1 04/07/2013  . Depression 04/07/2013  . Insomnia 04/07/2013  . Other and unspecified hyperlipidemia 04/07/2013  . Encounter to establish care 04/07/2013    Ronney LionDUCKER, Glenn Christo J,  PTA 07/31/2014, 3:23 PM  [facility] [place] [address]  [address] [place], [place], [address] Phone: [phone]   Fax:  [phone]

## 2014-08-04 ENCOUNTER — Telehealth: Payer: Self-pay | Admitting: Physician Assistant

## 2014-08-04 ENCOUNTER — Ambulatory Visit: Payer: Medicare Other | Admitting: Physical Therapy

## 2014-08-04 NOTE — Telephone Encounter (Signed)
Caller name:martha Relationship to patient:wife Can be reached: Pharmacy:  Reason for call:butran patch written rx

## 2014-08-05 ENCOUNTER — Other Ambulatory Visit: Payer: Self-pay | Admitting: Physician Assistant

## 2014-08-06 MED ORDER — BUPRENORPHINE 10 MCG/HR TD PTWK: 10.0000 ug | MEDICATED_PATCH | TRANSDERMAL | Status: DC

## 2014-08-06 NOTE — Telephone Encounter (Signed)
Pt aware.//AB/CMA 

## 2014-08-06 NOTE — Telephone Encounter (Signed)
Requesting Butran patch 3210mcg/HR-Place 1 patch onto the skin once a week. Last refill:07-02-14;#4,0 Last OV:04/22/14 UDS:12/16/13-Low risk-Next due:06/17/14 Please advise.//AB/CMA

## 2014-08-06 NOTE — Telephone Encounter (Signed)
Patient wife called back in. Requesting this to be picked up today.

## 2014-08-06 NOTE — Telephone Encounter (Signed)
Refill granted. At front desk for pickup -- patient is due for urine drug screen so will have to give urine sample at time of pickup.

## 2014-08-07 ENCOUNTER — Ambulatory Visit: Payer: Medicare Other | Admitting: Rehabilitation

## 2014-08-07 DIAGNOSIS — M25532 Pain in left wrist: Secondary | ICD-10-CM

## 2014-08-07 DIAGNOSIS — M25632 Stiffness of left wrist, not elsewhere classified: Secondary | ICD-10-CM | POA: Diagnosis not present

## 2014-08-07 DIAGNOSIS — R29898 Other symptoms and signs involving the musculoskeletal system: Secondary | ICD-10-CM

## 2014-08-07 NOTE — Therapy (Signed)
[facility] [place] [address]  [address] [place], [place], [address] Phone: [phone]   Fax:  [phone]  Physical Therapy Treatment  Patient Details  Name: Timothy Alvarez MRN: [id number] Date of Birth: Jul 31, 1955 Referring Provider:  Mack Hookhompson, David, MD  Encounter Date: 08/07/2014      PT End of Session - 08/07/14 1400    Visit Number 5   Number of Visits 12   Date for PT Re-Evaluation 09/01/14   PT Start Time 1400   PT Stop Time 1450   PT Time Calculation (min) 50 min      Past Medical History  Diagnosis Date  . Diabetes type 1, uncontrolled   . Frequent headaches   . Heart attack 09.13.2014    x5  . Hyperlipidemia   . Chicken pox   . Mumps   . Measles, [place] (rubella)   . Scarlet fever   . Diabetic retinopathy   . Cataracts, bilateral   . Orthostatic hypotension   . CAD (coronary artery disease)   . Contrast dye induced nephropathy   . OSA (obstructive sleep apnea) 07/17/2013    Past Surgical History  Procedure Laterality Date  . Coronary angioplasty with stent placement    . Appendectomy  1966  . Wisdom tooth extraction    . Refractive surgery      Retinopathy    There were no vitals filed for this visit.  Visit Diagnosis:  Wrist stiffness, left  Wrist pain, acute, left  Wrist weakness      Subjective Assessment - 08/07/14 1405    Subjective Reports no pain, just some soreness. Reports compliance with HEP and that he is trying to use his Lt hand more. Feels 70% back to normal with his Lt hand.   Currently in Pain? No/denies            [facility] - 08/07/14 0001    PROM   Left Wrist Extension 60 Degrees   Left Wrist Flexion 50 Degrees   Left Wrist Radial Deviation 28 Degrees   Left Wrist Ulnar Deviation 40 Degrees      TODAY'S TREATMENT: TherEx -  PROM all directions to pt tolerance with prolonged stretches Then PROM with gentle distraction to pt tolerance  Forearm  pronation/supination with 2# x10 Wrist flexion and extension with 2# x10 Elbow/wrist: pro/sup AROM x 20 each and AAROM for ROM   Manual - L wrist distraction and with grade II-III AP and PA mobes Distal radioulnar mobs to tolerance, grade I-II STM to anterior and posterior wrist to help decrease edema, loosen tissue, and decrease tenderness        PT Short Term Goals - 07/23/14 1538    PT SHORT TERM GOAL #1   Title pt independent with initial HEP by 07/28/14   Status Achieved           PT Long Term Goals - 07/23/14 1451    PT LONG TERM GOAL #1   Title L wrist and forearm PROM and AROM within 10% of R all planes by 09/01/14   Status On-going   PT LONG TERM GOAL #2   Title L Grip at least 85% of R by 09/01/14   Status On-going   PT LONG TERM GOAL #3   Title pt able to use L UE with all ADLs and chores without restriction by pain, weakness, or LOM by 09/01/14   Status On-going  Plan - 08/07/14 1441    Clinical Impression Statement Good tolerance to new exercises with some reports of pain. Still present swelling but improved ROM.    PT Next Visit Plan manual for ROM, stretching for ROM, modalities for pain PRN.  Progress to strengthening once ROM is [facility].   Consulted and Agree with Plan of Care Patient        Problem List Patient Active Problem List   Diagnosis Date Noted  . Infected cyst of skin 04/27/2014  . Sinusitis, acute maxillary 02/27/2014  . Dizziness 02/27/2014  . Chronic fatigue 02/03/2014  . DOE (dyspnea on exertion) 11/24/2013  . OSA (obstructive sleep apnea) 07/17/2013  . Bruit 05/08/2013  . 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 Abnormal laboratory test result 04/13/2013  . CAP (community acquired pneumonia) 04/13/2013  . Chronic pain syndrome 04/07/2013  . CAD (coronary artery disease) 04/07/2013  . Neuropathy 04/07/2013  . Orthostatic hypotension 04/07/2013  . Diabetes mellitus type 1 04/07/2013  . Depression  04/07/2013  . Insomnia 04/07/2013  . Other and unspecified hyperlipidemia 04/07/2013  . Encounter to establish care 04/07/2013    Ronney Lion, PTA 08/07/2014, 2:42 PM  [facility] [address]  [address] [place], [place], [address] Phone: [phone]   Fax:  [phone]

## 2014-08-12 ENCOUNTER — Encounter: Payer: Self-pay | Admitting: Physician Assistant

## 2014-08-12 ENCOUNTER — Ambulatory Visit (INDEPENDENT_AMBULATORY_CARE_PROVIDER_SITE_OTHER): Payer: Medicare Other | Admitting: Physician Assistant

## 2014-08-12 ENCOUNTER — Ambulatory Visit: Payer: Medicare Other | Admitting: Rehabilitation

## 2014-08-12 VITALS — BP 124/67 | HR 83 | Temp 98.6°F | Ht 66.0 in | Wt 186.8 lb

## 2014-08-12 DIAGNOSIS — G894 Chronic pain syndrome: Secondary | ICD-10-CM

## 2014-08-12 DIAGNOSIS — M25532 Pain in left wrist: Secondary | ICD-10-CM

## 2014-08-12 DIAGNOSIS — R29898 Other symptoms and signs involving the musculoskeletal system: Secondary | ICD-10-CM

## 2014-08-12 DIAGNOSIS — M25632 Stiffness of left wrist, not elsewhere classified: Secondary | ICD-10-CM

## 2014-08-12 DIAGNOSIS — R0602 Shortness of breath: Secondary | ICD-10-CM | POA: Insufficient documentation

## 2014-08-12 MED ORDER — HYDROCODONE-ACETAMINOPHEN 5-325 MG PO TABS: 1.0000 | ORAL_TABLET | Freq: Four times a day (QID) | ORAL | Status: DC | PRN

## 2014-08-12 NOTE — Assessment & Plan Note (Signed)
Patient does much better once winter months have passed.  Will wean down from Butrans patches to Norco only PRN for severe pain. Continue other chronic medication regimen.  Will check LFTs today.

## 2014-08-12 NOTE — Progress Notes (Signed)
Patient presents to clinic today c/o an episode of mid-sternal chest pain at rest accompanied by some shortness of breath.  Pain resolved within 30 minutes but shortness of breath stayed around the next day but was only with exertion.  Has history of 5 separate prior MIs.  Is followed by Cardiology and has appointment coming up in the next week.  Also has appointment with Endocrinology regarding his Diabetes Mellitus I.  In terms of pain, patient would like to go back to oral medications for pain.  Past Medical History  Diagnosis Date  . Diabetes type 1, uncontrolled   . Frequent headaches   . Heart attack 09.13.2014    x5  . Hyperlipidemia   . Chicken pox   . Mumps   . Measles, [place] (rubella)   . Scarlet fever   . Diabetic retinopathy   . Cataracts, bilateral   . Orthostatic hypotension   . CAD (coronary artery disease)   . Contrast dye induced nephropathy   . OSA (obstructive sleep apnea) 07/17/2013    Current Outpatient Prescriptions on File Prior to Visit  Medication Sig Dispense Refill  . aspirin 81 MG tablet Take 81 mg by mouth daily.    Marland Kitchen co-enzyme Q-10 30 MG capsule Take 30 mg by mouth daily.     . DULoxetine (CYMBALTA) 60 MG capsule TAKE 1 CAPSULE BY MOUTH DAILY. 30 capsule 3  . fludrocortisone (FLORINEF) 0.1 MG tablet TAKE 1 TABLET BY MOUTH TWICE DAILY 60 tablet 2  . gabapentin (NEURONTIN) 600 MG tablet Take 1 tablet (600 mg total) by mouth 3 (three) times daily. 674m AM, 309mwith lunch, 60086mM 270 tablet 1  . gabapentin (NEURONTIN) 600 MG tablet TAKE 1 TABLET (600 MG TOTAL) BY MOUTH 3 (THREE) TIMES DAILY. 90 tablet 0  . glucagon 1 MG injection Inject 1 mg into the vein once as needed (Emergency Test Kit).    . gMarland Kitchenucose blood test strip 1 each by Other route as directed. Use as instructed FREESTYLE IN VITRO    . insulin lispro (HUMALOG) 100 UNIT/ML injection Inject into the skin as directed. Use in Insulin Pump, Max daily dose: 100 units/day    . isosorbide  mononitrate (IMDUR) 30 MG 24 hr tablet TAKE 1 TABLET BY MOUTH DAILY 30 tablet 1  . meclizine (ANTIVERT) 12.5 MG tablet Take 1 tablet (12.5 mg total) by mouth 3 (three) times daily as needed for dizziness. 30 tablet 0  . metoCLOPramide (REGLAN) 10 MG tablet Take 1 tablet (10 mg total) by mouth every 8 (eight) hours as needed for nausea. 30 tablet 0  . midodrine (PROAMATINE) 10 MG tablet TAKE 1 TABLET BY MOUTH TWICE DAILY 60 tablet 2  . SENEXON-S 8.6-50 MG per tablet TAKE 1 TABLET BY MOUTH DAILY 100 tablet 0  . Vitamin D, Ergocalciferol, (DRISDOL) 50000 UNITS CAPS capsule Take 1 capsule (50,000 Units total) by mouth every 14 (fourteen) days. 10 capsule 0  . zolpidem (AMBIEN CR) 12.5 MG CR tablet Take 1 tablet (12.5 mg total) by mouth at bedtime. 30 tablet 2   No current facility-administered medications on file prior to visit.    No Known Allergies  Family History  Problem Relation Age of Onset  . Vascular Disease Father 96 5 Deceased  . Transient ischemic attack Father   . Hypertension Mother 93 41 Deceased  . Liver cancer Paternal Grandfather   . Breast cancer Sister   . Heart disease Sister  Ablation for rhythm disturbance  . Healthy Brother     x1  . Other Mother     s/p PPM    History   Social History  . Marital Status: Married    Spouse Name: N/A  . Number of Children: 1  . Years of Education: N/A   Social History Main Topics  . Smoking status: Never Smoker   . Smokeless tobacco: Not on file  . Alcohol Use: Yes     Comment: Rare  . Drug Use: No  . Sexual Activity: Not on file   Other Topics Concern  . 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  They have one grown daughter.   Highest level of education:  Master's Degree   Review of Systems - See HPI.  All other ROS are negative.  BP 124/67 mmHg  Pulse 83  Temp(Src) 98.6 F (37 C) (Oral)  Ht _0  (1.676 m)  Wt 186 lb 12.8 oz (84.732 kg)  BMI  30.16 kg/m2  SpO2 97%  Physical Exam  Constitutional: He is oriented to person, place, and time and well-developed, well-nourished, and in no distress.  HENT:  Head: Normocephalic and atraumatic.  Eyes: Conjunctivae are normal. Pupils are equal, round, and reactive to light.  Neck: Neck supple.  Cardiovascular: Normal rate, regular rhythm, normal heart sounds and intact distal pulses.   Pulmonary/Chest: Effort normal and breath sounds normal. No respiratory distress. He has no wheezes. He has no rales. He exhibits no tenderness.  Neurological: He is alert and oriented to person, place, and time.  Skin: Skin is warm and dry. No rash noted.  Psychiatric: Affect normal.  Vitals reviewed.  Assessment/Plan: SOB (shortness of breath) With short episode of chest pain without radiation.  EKG reveals some concern for anterolateral ischemia in this patient with extensive CV history. BP and pulse within normal limits.  Discussed compliance with ASA and Imdur. Follow-up with Cardiology as scheduled for assessment and myoview.  Will see if Cardiology appointment next week can be expedited.  Alarm signs/symptoms discussed with patient.  If anything recurs he is to call 911.   Chronic pain syndrome Patient does much better once winter months have passed.  Will wean down from Butrans patches to Norco only PRN for severe pain. Continue other chronic medication regimen.  Will check LFTs today.

## 2014-08-12 NOTE — Patient Instructions (Addendum)
Please keep your appointment with Dr. Jens Somrenshaw next week. Continue your aspirin and Imdur daily as directed.  Limit aerobic activity and overexertion until you see him.  If you develop any recurrence of symptoms, please call 911.   I am sending a message to Dr. Jens Somrenshaw to see about expediting your appointment/stress testing.  Please stop the Butrans and begin the Hydrocodone as directed. Do not forget to stop by the lab.  Refills will only be given if drug screen is passed.

## 2014-08-12 NOTE — Therapy (Signed)
[facility] [address]  [address] [place], [place], [address] Phone: [phone]   Fax:  [phone]  Physical Therapy Treatment  Patient Details  Name: Timothy Alvarez MRN: [id number] Date of Birth: Aug 09, 1955 Referring Provider:  Mack Hook, MD  Encounter Date: 08/12/2014      PT End of Session - 08/12/14 1449    Visit Number 6   Number of Visits 12   Date for PT Re-Evaluation 09/01/14   PT Start Time 1446   PT Stop Time 1525   PT Time Calculation (min) 39 min      Past Medical History  Diagnosis Date  . Diabetes type 1, uncontrolled   . Frequent headaches   . Heart attack 09.13.2014    x5  . Hyperlipidemia   . Chicken pox   . Mumps   . Measles, [place] (rubella)   . Scarlet fever   . Diabetic retinopathy   . Cataracts, bilateral   . Orthostatic hypotension   . CAD (coronary artery disease)   . Contrast dye induced nephropathy   . OSA (obstructive sleep apnea) 07/17/2013    Past Surgical History  Procedure Laterality Date  . Coronary angioplasty with stent placement    . Appendectomy  1966  . Wisdom tooth extraction    . Refractive surgery      Retinopathy    There were no vitals filed for this visit.  Visit Diagnosis:  Wrist stiffness, left  Wrist pain, acute, left  Wrist weakness      Subjective Assessment - 08/12/14 1447    Subjective Reports his wrist didn't start hurting until today. Hurt pretty bad after last time.    Currently in Pain? No/denies      TODAY'S TREATMENT: TherEx -  Wrist flexion and extension with 2# x10 with pause for stretch PROM all directions to pt tolerance with prolonged stretches Forearm pronation/supination with 2# x10 Velcro board supination/pronation (down and back =1) x2  Manual - L wrist distraction and with grade II-III AP and PA mobes STM to anterior and posterior wrist to help decrease edema, loosen tissue, and decrease tenderness  CPx5'        PT Short Term Goals - 07/23/14 1538    PT SHORT TERM GOAL #1   Title pt independent with initial HEP by 07/28/14   Status Achieved           PT Long Term Goals - 07/23/14 1451    PT LONG TERM GOAL #1   Title L wrist and forearm PROM and AROM within 10% of R all planes by 09/01/14   Status On-going   PT LONG TERM GOAL #2   Title L Grip at least 85% of R by 09/01/14   Status On-going   PT LONG TERM GOAL #3   Title pt able to use L UE with all ADLs and chores without restriction by pain, weakness, or LOM by 09/01/14   Status On-going               Plan - 08/12/14 1523    Clinical Impression Statement Difficult time with velcro board due to limited finger motion. Improved swelling today.    PT Next Visit Plan manual for ROM, stretching for ROM, modalities for pain PRN.  Progress to strengthening once ROM is [facility].   Consulted and Agree with Plan of Care Patient        Problem List Patient Active Problem List   Diagnosis  Date Noted  . Infected cyst of skin 04/27/2014  . Sinusitis, acute maxillary 02/27/2014  . Dizziness 02/27/2014  . Chronic fatigue 02/03/2014  . DOE (dyspnea on exertion) 11/24/2013  . OSA (obstructive sleep apnea) 07/17/2013  . Bruit 05/08/2013  . 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 Abnormal laboratory test result 04/13/2013  . CAP (community acquired pneumonia) 04/13/2013  . Chronic pain syndrome 04/07/2013  . CAD (coronary artery disease) 04/07/2013  . Neuropathy 04/07/2013  . Orthostatic hypotension 04/07/2013  . Diabetes mellitus type 1 04/07/2013  . Depression 04/07/2013  . Insomnia 04/07/2013  . Other and unspecified hyperlipidemia 04/07/2013  . Encounter to establish care 04/07/2013    Ronney LionDUCKER, Nash Bolls J, PTA 08/12/2014, 3:33 PM  [facility] [place] [address]  [address] [place], [place], [address] Phone: [phone]   Fax:  [phone]

## 2014-08-12 NOTE — Progress Notes (Signed)
Pre visit review using our clinic review tool, if applicable. No additional management support is needed unless otherwise documented below in the visit note. 

## 2014-08-12 NOTE — Assessment & Plan Note (Signed)
With short episode of chest pain without radiation.  EKG reveals some concern for anterolateral ischemia in this patient with extensive CV history. BP and pulse within normal limits.  Discussed compliance with ASA and Imdur. Follow-up with Cardiology as scheduled for assessment and myoview.  Will see if Cardiology appointment next week can be expedited.  Alarm signs/symptoms discussed with patient.  If anything recurs he is to call 911.

## 2014-08-13 ENCOUNTER — Encounter: Payer: Self-pay | Admitting: *Deleted

## 2014-08-13 LAB — HEPATIC FUNCTION PANEL
ALBUMIN: 3.7 g/dL (ref 3.5–5.2)
ALK PHOS: 138 U/L — AB (ref 39–117)
ALT: 27 U/L (ref 0–53)
AST: 27 U/L (ref 0–37)
Bilirubin, Direct: 0.1 mg/dL (ref 0.0–0.3)
Total Bilirubin: 0.5 mg/dL (ref 0.2–1.2)
Total Protein: 6.7 g/dL (ref 6.0–8.3)

## 2014-08-13 LAB — HM DIABETES EYE EXAM

## 2014-08-14 ENCOUNTER — Ambulatory Visit: Payer: Medicare Other | Attending: Orthopedic Surgery | Admitting: Rehabilitation

## 2014-08-14 DIAGNOSIS — M25632 Stiffness of left wrist, not elsewhere classified: Secondary | ICD-10-CM | POA: Insufficient documentation

## 2014-08-14 DIAGNOSIS — R29898 Other symptoms and signs involving the musculoskeletal system: Secondary | ICD-10-CM | POA: Insufficient documentation

## 2014-08-14 DIAGNOSIS — M25532 Pain in left wrist: Secondary | ICD-10-CM | POA: Diagnosis present

## 2014-08-14 NOTE — Therapy (Signed)
[facility] [address]  [address] [place], [place], [address] Phone: [phone]   Fax:  [phone]  Physical Therapy Treatment  Patient Details  Name: Timothy Alvarez MRN: [id number] Date of Birth: 05/08/1955 Referring Provider:  Mack Hook, MD  Encounter Date: 08/14/2014      PT End of Session - 08/14/14 1404    Visit Number 7   Number of Visits 12   Date for PT Re-Evaluation 09/01/14   PT Start Time 1401   PT Stop Time 1446   PT Time Calculation (min) 45 min      Past Medical History  Diagnosis Date  . Diabetes type 1, uncontrolled   . Frequent headaches   . Heart attack 09.13.2014    x5  . Hyperlipidemia   . Chicken pox   . Mumps   . Measles, [place] (rubella)   . Scarlet fever   . Diabetic retinopathy   . Cataracts, bilateral   . Orthostatic hypotension   . CAD (coronary artery disease)   . Contrast dye induced nephropathy   . OSA (obstructive sleep apnea) 07/17/2013    Past Surgical History  Procedure Laterality Date  . Coronary angioplasty with stent placement    . Appendectomy  1966  . Wisdom tooth extraction    . Refractive surgery      Retinopathy    There were no vitals filed for this visit.  Visit Diagnosis:  Wrist stiffness, left  Wrist pain, acute, left  Wrist weakness      Subjective Assessment - 08/14/14 1404    Subjective "No pain but it doesn't move the way I want." Notes a little increased pain after last visit but nothing bad.    Currently in Pain? No/denies      TODAY'S TREATMENT: TherEx -  PROM all directions to Lt wrist and forearm pt tolerance with prolonged stretches Forearm pronation/supination with 2# x15 Wrist flexion and extension with 2# x15 with pause for stretch Radial deviation 2# x10 Velcro board supination/pronation (down and back =1) x3  Manual - L wrist distraction and with grade II-III AP and PA mobes  CPx10'            PT Short Term  Goals - 07/23/14 1538    PT SHORT TERM GOAL #1   Title pt independent with initial HEP by 07/28/14   Status Achieved           PT Long Term Goals - 07/23/14 1451    PT LONG TERM GOAL #1   Title L wrist and forearm PROM and AROM within 10% of R all planes by 09/01/14   Status On-going   PT LONG TERM GOAL #2   Title L Grip at least 85% of R by 09/01/14   Status On-going   PT LONG TERM GOAL #3   Title pt able to use L UE with all ADLs and chores without restriction by pain, weakness, or LOM by 09/01/14   Status On-going               Plan - 08/14/14 1438    Clinical Impression Statement More stretching today due to pt feeling like his wrist was more stiff and worked on more supination and pronation as well.    PT Next Visit Plan manual for ROM, stretching for ROM, modalities for pain PRN.  Progress to strengthening once ROM is [facility].   Consulted and Agree with Plan of Care Patient  Problem List Patient Active Problem List   Diagnosis Date Noted  . SOB (shortness of breath) 08/12/2014  . Infected cyst of skin 04/27/2014  . Sinusitis, acute maxillary 02/27/2014  . Dizziness 02/27/2014  . Chronic fatigue 02/03/2014  . DOE (dyspnea on exertion) 11/24/2013  . OSA (obstructive sleep apnea) 07/17/2013  . Bruit 05/08/2013  . 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 Abnormal laboratory test result 04/13/2013  . CAP (community acquired pneumonia) 04/13/2013  . Chronic pain syndrome 04/07/2013  . CAD (coronary artery disease) 04/07/2013  . Neuropathy 04/07/2013  . Orthostatic hypotension 04/07/2013  . Diabetes mellitus type 1 04/07/2013  . Depression 04/07/2013  . Insomnia 04/07/2013  . Other and unspecified hyperlipidemia 04/07/2013  . Encounter to establish care 04/07/2013    Ronney LionDUCKER, Lynsi Dooner J, PTA 08/14/2014, 2:39 PM  [facility] [place] [address]  [address] [place], [place], [address] Phone: [phone]    Fax:  [phone]

## 2014-08-15 LAB — HEMOGLOBIN A1C: Hgb A1c MFr Bld: 7.5 % — AB (ref 4.0–6.0)

## 2014-08-19 ENCOUNTER — Other Ambulatory Visit: Payer: Self-pay | Admitting: Neurology

## 2014-08-19 ENCOUNTER — Other Ambulatory Visit: Payer: Self-pay | Admitting: Cardiology

## 2014-08-19 ENCOUNTER — Ambulatory Visit: Payer: Medicare Other | Admitting: Rehabilitation

## 2014-08-19 ENCOUNTER — Telehealth: Payer: Self-pay | Admitting: Cardiology

## 2014-08-19 DIAGNOSIS — M25632 Stiffness of left wrist, not elsewhere classified: Secondary | ICD-10-CM

## 2014-08-19 DIAGNOSIS — R29898 Other symptoms and signs involving the musculoskeletal system: Secondary | ICD-10-CM

## 2014-08-19 DIAGNOSIS — M25532 Pain in left wrist: Secondary | ICD-10-CM

## 2014-08-19 NOTE — Telephone Encounter (Signed)
New message        Pt PCP would like for pt to be seen this week by Ike Benerenshaw   Marge states pt has had an episode while in their office and also SOB   please give pt a call

## 2014-08-19 NOTE — Therapy (Signed)
[facility] [address]  [address] [place], [place], [address] Phone: [phone]   Fax:  [phone]  Physical Therapy Treatment  Patient Details  Name: Timothy Alvarez MRN: [id number] Date of Birth: 01-12-1956 Referring Provider:  Mack Hook, MD  Encounter Date: 08/19/2014      PT End of Session - 08/19/14 1406    Visit Number 8   Number of Visits 12   Date for PT Re-Evaluation 09/01/14   PT Start Time 1403   PT Stop Time 1440   PT Time Calculation (min) 37 min      Past Medical History  Diagnosis Date  . Diabetes type 1, uncontrolled   . Frequent headaches   . Heart attack 09.13.2014    x5  . Hyperlipidemia   . Chicken pox   . Mumps   . Measles, [place] (rubella)   . Scarlet fever   . Diabetic retinopathy   . Cataracts, bilateral   . Orthostatic hypotension   . CAD (coronary artery disease)   . Contrast dye induced nephropathy   . OSA (obstructive sleep apnea) 07/17/2013    Past Surgical History  Procedure Laterality Date  . Coronary angioplasty with stent placement    . Appendectomy  1966  . Wisdom tooth extraction    . Refractive surgery      Retinopathy    There were no vitals filed for this visit.  Visit Diagnosis:  Wrist stiffness, left  Wrist weakness  Wrist pain, acute, left      Subjective Assessment - 08/19/14 1404    Subjective Reports no pain. Went to the orthopedic yesterday (reports he forgot to tell us) and MD was very pleased with his progress. Reports bones are coming together wonderfully.    Currently in Pain? No/denies      TODAY'S TREATMENT: TherEx -  PROM all directions to Lt wrist and forearm pt tolerance with prolonged stretches Forearm pronation/supination with 2# x20 with 3" hold Wrist flexion and extension with 2# x20  Radial deviation 2# x10 Velcro board supination/pronation (down and back =1) x3 Forearm Supination stretch with 2# 5"x10 Wrist flexion and  extension stretch with 2# 5"x10  Manual - L wrist distraction and with grade II-III AP and PA mobes         PT Short Term Goals - 07/23/14 1538    PT SHORT TERM GOAL #1   Title pt independent with initial HEP by 07/28/14   Status Achieved           PT Long Term Goals - 07/23/14 1451    PT LONG TERM GOAL #1   Title L wrist and forearm PROM and AROM within 10% of R all planes by 09/01/14   Status On-going   PT LONG TERM GOAL #2   Title L Grip at least 85% of R by 09/01/14   Status On-going   PT LONG TERM GOAL #3   Title pt able to use L UE with all ADLs and chores without restriction by pain, weakness, or LOM by 09/01/14   Status On-going               Plan - 08/19/14 1443    Clinical Impression Statement Improved tolerance to weight exercises today with less report of pain. Pt reported he would go look for weights to use at home to continue these stretching/strengthening exercises.    PT Next Visit Plan manual for ROM, stretching for ROM, modalities for  pain PRN.  Progress to strengthening [facility] is [facility]. Check ROM.    Consulted and Agree with Plan of Care Patient        Problem List Patient Active Problem List   Diagnosis Date Noted  . SOB (shortness of breath) 08/12/2014  . Infected cyst of skin 04/27/2014  . Sinusitis, acute maxillary 02/27/2014  . Dizziness 02/27/2014  . Chronic fatigue 02/03/2014  . DOE (dyspnea on exertion) 11/24/2013  . OSA (obstructive sleep apnea) 07/17/2013  . Bruit 05/08/2013  . 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 Abnormal laboratory test result 04/13/2013  . CAP (community acquired pneumonia) 04/13/2013  . Chronic pain syndrome 04/07/2013  . CAD (coronary artery disease) 04/07/2013  . Neuropathy 04/07/2013  . Orthostatic hypotension 04/07/2013  . Diabetes mellitus type 1 04/07/2013  . Depression 04/07/2013  . Insomnia 04/07/2013  . Other and unspecified hyperlipidemia 04/07/2013  . Encounter to establish care  04/07/2013    Ronney LionDUCKER, Alaila Pillard J, PTA 08/19/2014, 2:44 PM  [facility] [place] [address]  [address] [place], [place], [address] Phone: [phone]   Fax:  [phone]

## 2014-08-19 NOTE — Telephone Encounter (Signed)
Left message to call.

## 2014-08-19 NOTE — Telephone Encounter (Signed)
Rx(s) sent to pharmacy electronically.  

## 2014-08-20 ENCOUNTER — Other Ambulatory Visit: Payer: Self-pay | Admitting: *Deleted

## 2014-08-20 ENCOUNTER — Telehealth: Payer: Self-pay | Admitting: *Deleted

## 2014-08-20 DIAGNOSIS — G894 Chronic pain syndrome: Secondary | ICD-10-CM

## 2014-08-20 MED ORDER — GABAPENTIN 600 MG PO TABS: ORAL_TABLET | ORAL | Status: DC

## 2014-08-20 NOTE — Telephone Encounter (Signed)
Note faxed back denying refill.

## 2014-08-20 NOTE — Telephone Encounter (Signed)
Refill sent per [facility] refill protocol/SLS  

## 2014-08-21 ENCOUNTER — Ambulatory Visit: Payer: Medicare Other | Admitting: Rehabilitation

## 2014-08-21 DIAGNOSIS — M25632 Stiffness of left wrist, not elsewhere classified: Secondary | ICD-10-CM | POA: Diagnosis not present

## 2014-08-21 DIAGNOSIS — R29898 Other symptoms and signs involving the musculoskeletal system: Secondary | ICD-10-CM

## 2014-08-21 DIAGNOSIS — M25532 Pain in left wrist: Secondary | ICD-10-CM

## 2014-08-21 NOTE — Therapy (Addendum)
[facility] [address]  [place], [place], [address] Phone: [phone]   Fax:  [phone]  Physical Therapy Treatment  Patient Details  Name: Timothy Alvarez MRN: [id number] Date of Birth: 04/26/1955 Referring Provider:  Milly Jakob, MD  Encounter Date: 08/21/2014      PT End of Session - 08/21/14 1447    Visit Number 9   Number of Visits 12   Date for PT Re-Evaluation 09/01/14   PT Start Time 1448   PT Stop Time 1525   PT Time Calculation (min) 37 min      Past Medical History  Diagnosis Date  . Diabetes type 1, uncontrolled   . Frequent headaches   . Heart attack 09.13.2014    x5  . Hyperlipidemia   . Chicken pox   . Mumps   . Measles, [place] (rubella)   . Scarlet fever   . Diabetic retinopathy   . Cataracts, bilateral   . Orthostatic hypotension   . CAD (coronary artery disease)   . Contrast dye induced nephropathy   . OSA (obstructive sleep apnea) 07/17/2013    Past Surgical History  Procedure Laterality Date  . Coronary angioplasty with stent placement    . Appendectomy  1966  . Wisdom tooth extraction    . Refractive surgery      Retinopathy    There were no vitals filed for this visit.  Visit Diagnosis:  Wrist stiffness, left  Wrist weakness  Wrist pain, acute, left      Subjective Assessment - 08/21/14 1451    Subjective Reports he has gotten some weights and been doing exercises at home without problems. States pain at best has been 0/10 and at worst an 8/10. Feels about 70% back to normal with ROM.    Currently in Pain? No/denies            [facility] PT Assessment - 08/21/14 0001    PROM   Left Wrist Extension 58 Degrees   Left Wrist Flexion 50 Degrees   Left Wrist Radial Deviation 28 Degrees   Left Wrist Ulnar Deviation 38 Degrees      TODAY'S TREATMENT: TherEx -  Forearm pronation/supination with 2# x20 with 3" hold Wrist flexion and extension with 2# x20   Radial deviation 2# x10 Forearm Supination stretch with 2# 5"x10 Wrist flexion and extension stretch with 2# 5"x10 Wall push ups x10 with focus on trying to keep hand on wall Prayer stretch 3x20"  Wrist flexion stretch 3x20" Velcro board supination/pronation (down and back =1) x3 with large tab roll, then long handle x1 (very hard)        PT Short Term Goals - 07/23/14 1538    PT SHORT TERM GOAL #1   Title pt independent with initial HEP by 07/28/14   Status Achieved           PT Long Term Goals - 07/23/14 1451    PT LONG TERM GOAL #1   Title L wrist and forearm PROM and AROM within 10% of R all planes by 09/01/14   Status On-going   PT LONG TERM GOAL #2   Title L Grip at least 85% of R by 09/01/14   Status On-going   PT LONG TERM GOAL #3   Title pt able to use L UE with all ADLs and chores without restriction by pain, weakness, or LOM by 09/01/14   Status On-going      G-Code: Carry -  Current and Discharge 20-40% limitation (CL), Goal was CL, Initial was 40-60% limitation (CK)        Plan - 08/21/14 1526    Clinical Impression Statement Excellent tolerace to all strengthening exercises and give strengthening HEP today. Pt feels he will be ready to d/c soon. ROM has minimal progress from last time.    PT Next Visit Plan manual for ROM, stretching for ROM, modalities for pain PRN.  Progress to strengthening once ROM is [facility]. G-code   Consulted and Agree with Plan of Care Patient        Problem List Patient Active Problem List   Diagnosis Date Noted  . SOB (shortness of breath) 08/12/2014  . Infected cyst of skin 04/27/2014  . Sinusitis, acute maxillary 02/27/2014  . Dizziness 02/27/2014  . Chronic fatigue 02/03/2014  . DOE (dyspnea on exertion) 11/24/2013  . OSA (obstructive sleep apnea) 07/17/2013  . Bruit 05/08/2013  . 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 Abnormal laboratory test result 04/13/2013  . CAP (community acquired pneumonia) 04/13/2013   . Chronic pain syndrome 04/07/2013  . CAD (coronary artery disease) 04/07/2013  . Neuropathy 04/07/2013  . Orthostatic hypotension 04/07/2013  . Diabetes mellitus type 1 04/07/2013  . Depression 04/07/2013  . Insomnia 04/07/2013  . Other and unspecified hyperlipidemia 04/07/2013  . Encounter to establish care 04/07/2013    Timothy Alvarez, PTA  08/21/2014, 3:27 PM  [facility] [address]  [place], [place], [address] Phone: [phone]   Fax:  [phone]     PHYSICAL THERAPY DISCHARGE SUMMARY  Visits from Start of Care: 9  Current functional level related to goals / functional outcomes: improved   Remaining deficits: Mild LOM and weakness   Education / Equipment: HEP Plan: Patient agrees to discharge.  Patient goals were partially met. Patient is being discharged due to being pleased with the current functional level.  ?????       Timothy Alvarez attended his 9th PT session and was scheduled for 10th visit with PT at which time re-assessment was to be performed; however, he called and cancelled stating he felt fine and no longer required physical therapy intervention. He is therefore being discharged from our care at this time.  Timothy Alvarez PT, OCS 09/29/2014 9:00 AM

## 2014-08-25 ENCOUNTER — Ambulatory Visit: Payer: Medicare Other | Admitting: Rehabilitation

## 2014-08-28 ENCOUNTER — Ambulatory Visit: Payer: Medicare Other | Admitting: Physical Therapy

## 2014-09-02 ENCOUNTER — Other Ambulatory Visit: Payer: Self-pay | Admitting: Physician Assistant

## 2014-09-10 ENCOUNTER — Encounter: Payer: Self-pay | Admitting: Cardiology

## 2014-09-10 ENCOUNTER — Ambulatory Visit (INDEPENDENT_AMBULATORY_CARE_PROVIDER_SITE_OTHER): Payer: Medicare Other | Admitting: Cardiology

## 2014-09-10 VITALS — BP 165/89 | HR 73 | Ht 66.0 in | Wt 187.7 lb

## 2014-09-10 DIAGNOSIS — I2510 Atherosclerotic heart disease of native coronary artery without angina pectoris: Secondary | ICD-10-CM | POA: Diagnosis not present

## 2014-09-10 DIAGNOSIS — I2583 Coronary atherosclerosis due to lipid rich plaque: Secondary | ICD-10-CM

## 2014-09-10 DIAGNOSIS — I951 Orthostatic hypotension: Secondary | ICD-10-CM

## 2014-09-10 DIAGNOSIS — E785 Hyperlipidemia, unspecified: Secondary | ICD-10-CM | POA: Diagnosis not present

## 2014-09-10 MED ORDER — ATORVASTATIN CALCIUM 40 MG PO TABS: 40.0000 mg | ORAL_TABLET | Freq: Every day | ORAL | Status: DC

## 2014-09-10 NOTE — Patient Instructions (Signed)
Your physician wants you to follow-up in: 6 MONTHS WITH DR Jens SomRENSHAW You will receive a reminder letter in the mail two months in advance. If you don't receive a letter, please call our office to schedule the follow-up appointment.   START ATORVASTATIN 40 MG ONCE DAILY  Your physician recommends that you return for lab work in:4 WEEKS AFTER STARTING ATORVASTATIN= DO NOT EAT PRIOR TO HAVING LAB WORK

## 2014-09-10 NOTE — Assessment & Plan Note (Signed)
Add Lipitor 40 mg daily.check lipids and liver in 4 weeks.

## 2014-09-10 NOTE — Progress Notes (Signed)
HPI: FU CAD. Patient previously lived in [place]. He had his first myocardial infarction in 2010. He has had subsequent events. He moved here in May of 2014. Echocardiogram October 2013 showed normal LV function and grade 2 diastolic dysfunction. Last cardiac catheterization in September 2014 and had stent to the mid right coronary artery. There was note of a mid-90% LAD; small caliber and not suitable for PCI of grafting. Ejection fraction 60%. Procedure complicated by contrast nephropathy requiring dialysis. Carotid Dopplers February 2015 showed mild focal plaques in both carotid bifurcations but no hemodynamically significant stenosis. Also with h/o orthostatic hypotension. Since last seen, he has dyspnea with more extreme activities but not routine activities. No orthopnea or PND. Recent mild pedal edema that resolved. No syncope. He had an episode of sharp chest pain approximately one month ago that was substernal and lasted 2 seconds. Otherwise no exertional chest pain.  Current Outpatient Prescriptions  Medication Sig Dispense Refill  . aspirin 81 MG tablet Take 81 mg by mouth daily.    Marland Kitchen co-enzyme Q-10 30 MG capsule Take 30 mg by mouth daily.     . DULoxetine (CYMBALTA) 60 MG capsule TAKE 1 CAPSULE BY MOUTH DAILY. 30 capsule 3  . fludrocortisone (FLORINEF) 0.1 MG tablet TAKE 1 TABLET BY MOUTH TWICE DAILY 60 tablet 2  . gabapentin (NEURONTIN) 600 MG tablet TAKE 1 TABLET (600 MG TOTAL) BY MOUTH 3 (THREE) TIMES DAILY. 90 tablet 0  . glucagon 1 MG injection Inject 1 mg into the vein once as needed (Emergency Test Kit).    Marland Kitchen glucose blood test strip 1 each by Other route as directed. Use as instructed FREESTYLE IN VITRO    . HYDROcodone-acetaminophen (NORCO/VICODIN) 5-325 MG per tablet Take 1 tablet by mouth every 6 (six) hours as needed for severe pain. 60 tablet 0  . insulin lispro (HUMALOG) 100 UNIT/ML injection Inject into the skin as directed. Use in Insulin Pump, Max daily dose: 100  units/day    . isosorbide mononitrate (IMDUR) 30 MG 24 hr tablet Take 1 tablet (30 mg total) by mouth daily. 30 tablet 4  . meclizine (ANTIVERT) 12.5 MG tablet Take 1 tablet (12.5 mg total) by mouth 3 (three) times daily as needed for dizziness. 30 tablet 0  . metoCLOPramide (REGLAN) 10 MG tablet Take 1 tablet (10 mg total) by mouth every 8 (eight) hours as needed for nausea. 30 tablet 0  . midodrine (PROAMATINE) 10 MG tablet TAKE 1 TABLET BY MOUTH TWICE DAILY 60 tablet 2  . oxyCODONE-acetaminophen (PERCOCET/ROXICET) 5-325 MG per tablet     . SENEXON-S 8.6-50 MG per tablet TAKE 1 TABLET BY MOUTH DAILY 100 tablet 0  . Vitamin D, Ergocalciferol, (DRISDOL) 50000 UNITS CAPS capsule TAKE 1 CAPSULE (50,000 UNITS TOTAL) BY MOUTH EVERY 14 (FOURTEEN) DAYS. 10 capsule 0  . zolpidem (AMBIEN CR) 12.5 MG CR tablet Take 1 tablet (12.5 mg total) by mouth at bedtime. 30 tablet 2   No current facility-administered medications for this visit.     Past Medical History  Diagnosis Date  . Diabetes type 1, uncontrolled   . Frequent headaches   . Heart attack 09.13.2014    x5  . Hyperlipidemia   . Chicken pox   . Mumps   . Measles, [place] (rubella)   . Scarlet fever   . Diabetic retinopathy   . Cataracts, bilateral   . Orthostatic hypotension   . CAD (coronary artery disease)   . Contrast dye induced nephropathy   .  OSA (obstructive sleep apnea) 07/17/2013    Past Surgical History  Procedure Laterality Date  . Coronary angioplasty with stent placement    . Appendectomy  1966  . Wisdom tooth extraction    . Refractive surgery      Retinopathy    History   Social History  . Marital Status: Married    Spouse Name: N/A  . Number of Children: 1  . Years of Education: N/A   Occupational History  . Not on file.   Social History Main Topics  . Smoking status: Never Smoker   . Smokeless tobacco: Not on file  . Alcohol Use: Yes     Comment: Rare  . Drug Use: No  . Sexual Activity: Not on file    Other Topics Concern  . 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  They have one grown daughter.   Highest level of education:  Master's Degree    ROS: no fevers or chills, productive cough, hemoptysis, dysphasia, odynophagia, melena, hematochezia, dysuria, hematuria, rash, seizure activity, orthopnea, PND, pedal edema, claudication. Remaining systems are negative.  Physical Exam: Well-developed well-nourished in no acute distress.  Skin is warm and dry.  HEENT is normal.  Neck is supple.  Chest is clear to auscultation with normal expansion.  Cardiovascular exam is regular rate and rhythm.  Abdominal exam nontender or distended. No masses palpated. Extremities show no edema. neuro grossly intact  Electrocardiogram 08/12/2014 showed sinus rhythm, left anterior fascicular block and nonspecific ST changes.

## 2014-09-10 NOTE — Assessment & Plan Note (Signed)
Continue Midodrine and Florinef. His blood pressure is mildly elevated today but I will not add additional antihypertensives given orthostatic hypotension.

## 2014-09-10 NOTE — Assessment & Plan Note (Signed)
Continue aspirin.patient is not taking a statin. Begin Lipitor 40 mg daily. Check lipids and liver in 4 weeks. He has had some myalgias with higher doses in the past. He did have an episode of chest pain approximately one month ago but it lasted only 2 seconds. The electrocardiogram was unremarkable. I'm not convinced this was cardiac. Plan continued medical therapy unless recurrent symptoms.

## 2014-09-12 ENCOUNTER — Telehealth: Payer: Self-pay | Admitting: Physician Assistant

## 2014-09-12 NOTE — Telephone Encounter (Signed)
Relation to pt: self  Call back number: [phone] Pharmacy: [facility] [facility] [place] [phone] (Phone) [phone])         Reason for call:  Pt requesting zolpidem (AMBIEN CR) 12.5 MG CR tablet. Pt would like script called in today.

## 2014-09-12 NOTE — Telephone Encounter (Addendum)
Called and spoke with the pt and informed him [facility] that because the rx is a Controlled Substance we will not be able to refill it this soon .  But we maybe able to refill it next week.  Pt verbalized understanding and agreed.//AB/CMA

## 2014-09-19 ENCOUNTER — Other Ambulatory Visit: Payer: Self-pay | Admitting: Physician Assistant

## 2014-09-19 MED ORDER — ZOLPIDEM TARTRATE ER 12.5 MG PO TBCR: 12.5000 mg | EXTENDED_RELEASE_TABLET | Freq: Every day | ORAL | Status: DC

## 2014-09-19 NOTE — Telephone Encounter (Signed)
[facility] 4:49pm @ [phone]) informing the pt that the Ambien rx has been approved and sent to the pharmacy.//AB/CMA

## 2014-09-19 NOTE — Telephone Encounter (Addendum)
Caller name: Thressa ShellerWebb,Martha Relation to pt: spouse  Call back number: [phone] Pharmacy: [facility] [facility] [place]  [address] [phone] (Phone) [phone])         Reason for call:  Spouse checking on the status of medication request.

## 2014-10-09 ENCOUNTER — Other Ambulatory Visit: Payer: Self-pay | Admitting: Physician Assistant

## 2014-10-09 MED ORDER — GABAPENTIN 600 MG PO TABS: ORAL_TABLET | ORAL | Status: DC

## 2014-10-09 NOTE — Telephone Encounter (Signed)
Last filled:  08/20/14 Amt: 90, 0 Last OV: 08/12/14  Rx sent.

## 2014-10-09 NOTE — Telephone Encounter (Signed)
Received call from [place] [facility] requesting refill on gabapentin for pt

## 2014-11-07 ENCOUNTER — Other Ambulatory Visit: Payer: Self-pay | Admitting: Physician Assistant

## 2014-11-10 ENCOUNTER — Telehealth: Payer: Self-pay | Admitting: Physician Assistant

## 2014-11-10 ENCOUNTER — Other Ambulatory Visit: Payer: Self-pay | Admitting: Physician Assistant

## 2014-11-10 MED ORDER — HYDROCODONE-ACETAMINOPHEN 5-325 MG PO TABS: 1.0000 | ORAL_TABLET | Freq: Four times a day (QID) | ORAL | Status: DC | PRN

## 2014-11-10 NOTE — Telephone Encounter (Signed)
Rx request to pharmacy/SLS  

## 2014-11-10 NOTE — Telephone Encounter (Signed)
Medication Detail      Disp Refills Start End     HYDROcodone-acetaminophen (NORCO/VICODIN) 5-325 MG per tablet 60 tablet 0 08/12/2014     Sig - Route: Take 1 tablet by mouth every 6 (six) hours as needed for severe pain. - Oral    Class: Print   Pharmacy    [facility] [facility] [place] - [address]   Patient informed, understood & agreed RX ready for p/u during regular office hours/SLS

## 2014-11-10 NOTE — Telephone Encounter (Signed)
Caller name:Quavion Hartfield  Relationship to patient:self  Can be reached: [phone]  Pharmacy:  Reason for call: pt called in to request a refill on his HYDROcodone Rx. Please call back to confirm when ready for pick up.

## 2014-11-10 NOTE — Telephone Encounter (Signed)
Refill granted. Rx placed at front desk.

## 2014-11-12 ENCOUNTER — Other Ambulatory Visit: Payer: Self-pay | Admitting: Physician Assistant

## 2014-11-12 MED ORDER — SENNOSIDES-DOCUSATE SODIUM 8.6-50 MG PO TABS: 1.0000 | ORAL_TABLET | Freq: Every day | ORAL | Status: DC

## 2014-11-25 ENCOUNTER — Other Ambulatory Visit: Payer: Self-pay | Admitting: *Deleted

## 2014-11-25 DIAGNOSIS — E785 Hyperlipidemia, unspecified: Secondary | ICD-10-CM

## 2014-11-25 MED ORDER — ATORVASTATIN CALCIUM 40 MG PO TABS
40.0000 mg | ORAL_TABLET | Freq: Every day | ORAL | Status: DC
Start: 2014-11-25 — End: 2015-07-01

## 2014-12-08 ENCOUNTER — Other Ambulatory Visit: Payer: Self-pay | Admitting: Physician Assistant

## 2014-12-08 NOTE — Telephone Encounter (Signed)
Rx request faxed to pharmacy; [facility] with contact name and number [for return call, if needed] RE: Rx request faxed to pharmacy/SLS

## 2014-12-11 ENCOUNTER — Encounter: Payer: Self-pay | Admitting: *Deleted

## 2014-12-18 ENCOUNTER — Telehealth: Payer: Self-pay | Admitting: Pulmonary Disease

## 2014-12-18 NOTE — Telephone Encounter (Signed)
Spoke with pt, needs ov for cpap coverage per his insurance.  [facility] has no openings.  Pt scheduled next Friday with TP.  Nothing further needed.

## 2014-12-26 ENCOUNTER — Encounter: Payer: Self-pay | Admitting: Adult Health

## 2014-12-26 ENCOUNTER — Ambulatory Visit (INDEPENDENT_AMBULATORY_CARE_PROVIDER_SITE_OTHER): Payer: Medicare Other | Admitting: Adult Health

## 2014-12-26 VITALS — BP 108/62 | HR 70 | Temp 97.7°F | Ht 66.0 in | Wt 189.4 lb

## 2014-12-26 DIAGNOSIS — G4733 Obstructive sleep apnea (adult) (pediatric): Secondary | ICD-10-CM | POA: Diagnosis not present

## 2014-12-26 NOTE — Progress Notes (Signed)
   Subjective:    Patient ID: Timothy Alvarez, male    DOB: 04-08-1955, 59 y.o.   MRN: [id number]  HPI 59 year old male with moderate sleep apnea followed by Dr. Craige CottaSood   TESTS: PSG 06/11/13 >> AHI 19, SaO2 low 77%  12/26/14 Follow up : OSA  Patient returns for yearly follow-up for sleep apnea. Says overall he is doing well. Wears C Pap machine 6-7 hours each night. Needs order for new supplies. He denies any significant daytime sleepiness. Download requested.   Review of Systems Constitutional:   No  weight loss, night sweats,  Fevers, chills, fatigue, or  lassitude.  HEENT:   No headaches,  Difficulty swallowing,  Tooth/dental problems, or  Sore throat,                No sneezing, itching, ear ache, nasal congestion, post nasal drip,   CV:  No chest pain,  Orthopnea, PND, swelling in lower extremities, anasarca, dizziness, palpitations, syncope.   GI  No heartburn, indigestion, abdominal pain, nausea, vomiting, diarrhea, change in bowel habits, loss of appetite, bloody stools.   Resp: No shortness of breath with exertion or at rest.  No excess mucus, no productive cough,  No non-productive cough,  No coughing up of blood.  No change in color of mucus.  No wheezing.  No chest wall deformity  Skin: no rash or lesions.  GU: no dysuria, change in color of urine, no urgency or frequency.  No flank pain, no hematuria   MS:  No joint pain or swelling.  No decreased range of motion.  No back pain.  Psych:  No change in mood or affect. No depression or anxiety.  No memory loss.         Objective:   Physical Exam GEN: A/Ox3; pleasant , NAD, well nourished   HEENT:  [place]/AT,  EACs-clear, TMs-wnl, NOSE-clear, THROAT-clear, no lesions, no postnasal drip or exudate noted.   NECK:  Supple w/ fair ROM; no JVD; normal carotid impulses w/o bruits; no thyromegaly or nodules palpated; no lymphadenopathy.  RESP  Clear  P & A; w/o, wheezes/ rales/ or rhonchi.no accessory muscle use, no  dullness to percussion  CARD:  RRR, no m/r/g  , no peripheral edema, pulses intact, no cyanosis or clubbing.  GI:   Soft & nt; nml bowel sounds; no organomegaly or masses detected.  Musco: Warm bil, no deformities or joint swelling noted.   Neuro: alert, no focal deficits noted.    Skin: Warm, no lesions or rashes         Assessment & Plan:

## 2014-12-26 NOTE — Patient Instructions (Signed)
Continue on CPAP At bedtime   Wear at least 6hr each night  CPAP download requested.  Do not drive if sleepy.  follow up Dr. Craige CottaSood  In 1 year and As needed

## 2014-12-31 NOTE — Assessment & Plan Note (Signed)
OSA controlled on CPAP  Plan   Continue on CPAP At bedtime   Wear at least 6hr each night  CPAP download requested.  Do not drive if sleepy.  follow up Dr. Craige CottaSood  In 1 year and As needed

## 2015-01-05 ENCOUNTER — Ambulatory Visit (INDEPENDENT_AMBULATORY_CARE_PROVIDER_SITE_OTHER): Payer: Medicare Other | Admitting: Physician Assistant

## 2015-01-05 ENCOUNTER — Encounter: Payer: Self-pay | Admitting: Physician Assistant

## 2015-01-05 VITALS — BP 101/56 | HR 74 | Temp 98.2°F | Resp 16 | Ht 66.0 in | Wt 188.0 lb

## 2015-01-05 DIAGNOSIS — E1022 Type 1 diabetes mellitus with diabetic chronic kidney disease: Secondary | ICD-10-CM | POA: Diagnosis not present

## 2015-01-05 DIAGNOSIS — G894 Chronic pain syndrome: Secondary | ICD-10-CM

## 2015-01-05 DIAGNOSIS — N189 Chronic kidney disease, unspecified: Secondary | ICD-10-CM

## 2015-01-05 DIAGNOSIS — R5382 Chronic fatigue, unspecified: Secondary | ICD-10-CM

## 2015-01-05 DIAGNOSIS — G629 Polyneuropathy, unspecified: Secondary | ICD-10-CM

## 2015-01-05 DIAGNOSIS — Z23 Encounter for immunization: Secondary | ICD-10-CM | POA: Diagnosis not present

## 2015-01-05 MED ORDER — HYDROCODONE-ACETAMINOPHEN 5-325 MG PO TABS: 1.0000 | ORAL_TABLET | Freq: Four times a day (QID) | ORAL | Status: DC | PRN

## 2015-01-05 NOTE — Progress Notes (Signed)
Patient presents to clinic today for 6 months follow-up of chronic pain syndrome. Is currently on Norco prn, Cymbalta 60 mg daily and Gabapentin 600 mg TID. Is taking medications as directed. Has recently separated from wife which is making him have to do more physical activities which has been beneficial to him. Is eating better and exercising more.  Pain is doing well with current regimen.  Patient also with history of Vitamin D deficiency. Is overdue for repeat Vitamin D levels.  Of note, multiple DM metrics are unmet, mainly as patient is followed by an outside Endocrinologist. Patient given medical records release for so records can be obtained. Is currently on ACEI. Last Ophthalmology examination on 11/24/14 without diabetic retinopathy. Will perform foot exam. Is due for flu shot.   Past Medical History  Diagnosis Date  . Diabetes type 1, uncontrolled ([place])   . Frequent headaches   . Heart attack ([place]) 09.13.2014    x5  . Hyperlipidemia   . Chicken pox   . Mumps   . Measles, [place] (rubella)   . Scarlet fever   . Diabetic retinopathy ([place])   . Cataracts, bilateral   . Orthostatic hypotension   . CAD (coronary artery disease)   . Contrast dye induced nephropathy   . OSA (obstructive sleep apnea) 07/17/2013    Current Outpatient Prescriptions on File Prior to Visit  Medication Sig Dispense Refill  . aspirin 81 MG tablet Take 81 mg by mouth daily.    Marland Kitchen atorvastatin (LIPITOR) 40 MG tablet Take 1 tablet (40 mg total) by mouth daily. 90 tablet 3  . co-enzyme Q-10 30 MG capsule Take 30 mg by mouth daily.     . DULoxetine (CYMBALTA) 60 MG capsule TAKE 1 CAPSULE BY MOUTH DAILY. 30 capsule 3  . fludrocortisone (FLORINEF) 0.1 MG tablet TAKE 1 TABLET BY MOUTH TWICE DAILY 60 tablet 2  . gabapentin (NEURONTIN) 600 MG tablet TAKE 1 TABLET (600 MG TOTAL) BY MOUTH 3 (THREE) TIMES DAILY. 90 tablet 2  . glucagon 1 MG injection Inject 1 mg into the vein once as needed (Emergency Test Kit).    Marland Kitchen  glucose blood test strip 1 each by Other route as directed. Use as instructed FREESTYLE IN VITRO    . insulin lispro (HUMALOG) 100 UNIT/ML injection Inject into the skin as directed. Use in Insulin Pump, Max daily dose: 100 units/day    . isosorbide mononitrate (IMDUR) 30 MG 24 hr tablet Take 1 tablet (30 mg total) by mouth daily. 30 tablet 4  . meclizine (ANTIVERT) 12.5 MG tablet Take 1 tablet (12.5 mg total) by mouth 3 (three) times daily as needed for dizziness. 30 tablet 0  . metoCLOPramide (REGLAN) 10 MG tablet Take 1 tablet (10 mg total) by mouth every 8 (eight) hours as needed for nausea. 30 tablet 0  . midodrine (PROAMATINE) 10 MG tablet TAKE 1 TABLET BY MOUTH TWICE DAILY 60 tablet 2  . senna-docusate (SENEXON-S) 8.6-50 MG per tablet Take 1 tablet by mouth daily. 100 tablet 0  . zolpidem (AMBIEN CR) 12.5 MG CR tablet TAKE 1 TABLET BY MOUTH AT BEDTIME 30 tablet 2   No current facility-administered medications on file prior to visit.    No Known Allergies  Family History  Problem Relation Age of Onset  . Vascular Disease Father 19    Deceased  . Transient ischemic attack Father   . Hypertension Mother 71    Deceased  . Liver cancer Paternal Grandfather   .  Breast cancer Sister   . Heart disease Sister     Ablation for rhythm disturbance  . Healthy Brother     x1  . Other Mother     s/p PPM    Social History   Social History  . Marital Status: Married    Spouse Name: N/A  . Number of Children: 1  . Years of Education: N/A   Social History Main Topics  . Smoking status: Never Smoker   . Smokeless tobacco: None  . Alcohol Use: Yes     Comment: Rare  . Drug Use: No  . Sexual Activity: Not Asked   Other Topics Concern  . 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  They have one grown daughter.   Highest level of education:  Master's Degree   Review of Systems - See HPI.  All other ROS are negative.  BP  101/56 mmHg  Pulse 74  Temp(Src) 98.2 F (36.8 C) (Oral)  Resp 16  Ht $R'5\' 6"'gb$  (1.676 m)  Wt 188 lb (85.276 kg)  BMI 30.36 kg/m2  SpO2 98%  Physical Exam  Constitutional: He is oriented to person, place, and time and well-developed, well-nourished, and in no distress.  HENT:  Head: Normocephalic and atraumatic.  Eyes: Conjunctivae are normal.  Neck: Neck supple.  Cardiovascular: Normal rate, regular rhythm, normal heart sounds and intact distal pulses.   Pulmonary/Chest: Effort normal and breath sounds normal. No respiratory distress. He has no wheezes. He has no rales. He exhibits no tenderness.  Neurological: He is alert and oriented to person, place, and time.  Skin: Skin is warm and dry. No rash noted.  Psychiatric: Affect normal.  Vitals reviewed.  Diabetic Foot Form - Detailed   Diabetic Foot Exam - detailed  Diabetic Foot exam was performed with the following findings:  Yes 01/05/2015  2:44 PM  Visual Foot Exam completed.:  Yes  Is there a history of foot ulcer?:  No  Can the patient see the bottom of their feet?:  Yes  Are the shoes appropriate in style and fit?:  Yes  Is there swelling or and abnormal foot shape?:  No  Are the toenails long?:  No  Are the toenails thick?:  Yes  Do you have pain in calf while walking?:  No  Is there a claw toe deformity?:  No  Is there elevated skin temparature?:  No  Is there limited skin dorsiflexion?:  No  Is there foot or ankle muscle weakness?:  No  Are the toenails ingrown?:  No  Normal Range of Motion:  Yes    Pulse Foot Exam completed.:  Yes  Right posterior Tibialias:  Present Left posterior Tibialias:  Present  Right Dorsalis Pedis:  Present Left Dorsalis Pedis:  Present  Sensory Foot Exam Completed.:  Yes  Swelling:  No  Semmes-Weinstein Monofilament Test  R Site 1-Great Toe:  Pos L Site 1-Great Toe:  Pos  R Site 4:  Pos L Site 4:  Pos  R Site 5:  Pos L Site 5:  Neg        No results found for this or any previous  visit (from the past 2160 hour(s)).  Assessment/Plan: Neuropathy Evident on foot exam. Continue Gabapentin and Cymbalta. Follow-up with Endocrinology as scheduled.  Diabetes mellitus type 1 Followed by Endocrinology. Will obtain records. Foot exam completed. Flu shot given. Ophthalmology report will be scanned into  EMR.   Chronic pain syndrome Doing well. Continue current regimen. Will check LFTs today.  Chronic fatigue Will repeat Vitamin D level today.

## 2015-01-05 NOTE — Assessment & Plan Note (Signed)
Evident on foot exam. Continue Gabapentin and Cymbalta. Follow-up with Endocrinology as scheduled.

## 2015-01-05 NOTE — Progress Notes (Signed)
Pre visit review using our clinic review tool, if applicable. No additional management support is needed unless otherwise documented below in the visit note/SLS  

## 2015-01-05 NOTE — Assessment & Plan Note (Signed)
Followed by Endocrinology. Will obtain records. Foot exam completed. Flu shot given. Ophthalmology report will be scanned into EMR.

## 2015-01-05 NOTE — Assessment & Plan Note (Signed)
Doing well. Continue current regimen. Will check LFTs today.

## 2015-01-05 NOTE — Assessment & Plan Note (Signed)
Will repeat Vitamin D level today. 

## 2015-01-05 NOTE — Patient Instructions (Addendum)
Please continue medications as directed. Stop by the lab for blood work. I will call you with your results. We will be getting your records from Endocrinology so we can see all they are doing for you.  Follow-up 3 months for physical.

## 2015-01-06 LAB — HEPATIC FUNCTION PANEL
ALBUMIN: 3.6 g/dL (ref 3.5–5.2)
ALK PHOS: 113 U/L (ref 39–117)
ALT: 15 U/L (ref 0–53)
AST: 18 U/L (ref 0–37)
Bilirubin, Direct: 0.1 mg/dL (ref 0.0–0.3)
Total Bilirubin: 0.5 mg/dL (ref 0.2–1.2)
Total Protein: 6.6 g/dL (ref 6.0–8.3)

## 2015-01-06 LAB — VITAMIN D 25 HYDROXY (VIT D DEFICIENCY, FRACTURES): VITD: 28.06 ng/mL — ABNORMAL LOW (ref 30.00–100.00)

## 2015-01-07 ENCOUNTER — Other Ambulatory Visit: Payer: Self-pay

## 2015-01-07 MED ORDER — VITAMIN D (ERGOCALCIFEROL) 1.25 MG (50000 UNIT) PO CAPS: 50000.0000 [IU] | ORAL_CAPSULE | ORAL | Status: DC

## 2015-01-09 ENCOUNTER — Telehealth: Payer: Self-pay | Admitting: *Deleted

## 2015-01-09 NOTE — Telephone Encounter (Signed)
LMOM with contact name and number [for return call, if needed] RE: results and further provider instructions; new Rx sent to phamracy/[facility]

## 2015-01-09 NOTE — Telephone Encounter (Signed)
-----   Message from Waldon MerlWilliam C Martin, PA-C sent at 01/07/2015  7:06 AM EDT ----- Labs look good overall. Vitamin D still low, although better than last check. Ok to refill his Ergocalciferol 50,000 units to take once weekly for 8 weeks. Then he will start daily OTC Vitamin D 1000 units. Follow-up 3 months.

## 2015-01-12 ENCOUNTER — Telehealth: Payer: Self-pay | Admitting: *Deleted

## 2015-01-12 NOTE — Telephone Encounter (Signed)
Medical records [facility] via fax [facility]. Forwarded to [employer]/Sharon. JG//CMA

## 2015-01-23 ENCOUNTER — Other Ambulatory Visit: Payer: Self-pay | Admitting: Cardiology

## 2015-02-02 ENCOUNTER — Other Ambulatory Visit: Payer: Self-pay | Admitting: Physician Assistant

## 2015-02-13 ENCOUNTER — Other Ambulatory Visit: Payer: Self-pay | Admitting: Physician Assistant

## 2015-02-27 ENCOUNTER — Encounter: Payer: Self-pay | Admitting: Physician Assistant

## 2015-02-27 ENCOUNTER — Ambulatory Visit (INDEPENDENT_AMBULATORY_CARE_PROVIDER_SITE_OTHER): Payer: Medicare Other | Admitting: Physician Assistant

## 2015-02-27 ENCOUNTER — Other Ambulatory Visit: Payer: Self-pay | Admitting: Physician Assistant

## 2015-02-27 VITALS — BP 130/58 | HR 84 | Temp 98.2°F | Ht 66.0 in | Wt 186.2 lb

## 2015-02-27 DIAGNOSIS — H6981 Other specified disorders of Eustachian tube, right ear: Secondary | ICD-10-CM | POA: Diagnosis not present

## 2015-02-27 MED ORDER — FLUTICASONE PROPIONATE 50 MCG/ACT NA SUSP: 2.0000 | Freq: Every day | NASAL | Status: DC

## 2015-02-27 NOTE — Patient Instructions (Signed)
Please use Flonase as directed. Stay hydrated and rest. Hold your nose and blow to help pop open ears if they feel clogged.  Follow-up with me if symptoms are not resolving

## 2015-02-27 NOTE — Progress Notes (Signed)
Pre visit review using our clinic review tool, if applicable. No additional management support is needed unless otherwise documented below in the visit note. 

## 2015-02-27 NOTE — Progress Notes (Signed)
Patient presents to clinic today c/o "water in the R ear" for 1 week. Denies tinnitus or dizziness. Endorses a sensation of pressure. Endorses some mild fatigue over the past couple of days. Denies cough, nasal congestion but endorses mild head congestion.  Denies recent travel or sick contact.  Past Medical History  Diagnosis Date  . Diabetes type 1, uncontrolled ([place])   . Frequent headaches   . Heart attack ([place]) 09.13.2014    x5  . Hyperlipidemia   . Chicken pox   . Mumps   . Measles, [place] (rubella)   . Scarlet fever   . Diabetic retinopathy ([place])   . Cataracts, bilateral   . Orthostatic hypotension   . CAD (coronary artery disease)   . Contrast dye induced nephropathy   . OSA (obstructive sleep apnea) 07/17/2013    Current Outpatient Prescriptions on File Prior to Visit  Medication Sig Dispense Refill  . aspirin 81 MG tablet Take 81 mg by mouth daily.    Marland Kitchen atorvastatin (LIPITOR) 40 MG tablet Take 1 tablet (40 mg total) by mouth daily. 90 tablet 3  . co-enzyme Q-10 30 MG capsule Take 30 mg by mouth daily.     . DULoxetine (CYMBALTA) 60 MG capsule TAKE 1 CAPSULE BY MOUTH DAILY. 30 capsule 3  . fludrocortisone (FLORINEF) 0.1 MG tablet TAKE 1 TABLET BY MOUTH TWICE DAILY 60 tablet 2  . glucagon 1 MG injection Inject 1 mg into the vein once as needed (Emergency Test Kit).    Marland Kitchen glucose blood test strip 1 each by Other route as directed. Use as instructed FREESTYLE IN VITRO    . HYDROcodone-acetaminophen (NORCO/VICODIN) 5-325 MG tablet Take 1 tablet by mouth every 6 (six) hours as needed for severe pain. 60 tablet 0  . insulin lispro (HUMALOG) 100 UNIT/ML injection Inject into the skin as directed. Use in Insulin Pump, Max daily dose: 100 units/day    . isosorbide mononitrate (IMDUR) 30 MG 24 hr tablet TAKE 1 TABLET (30 MG TOTAL) BY MOUTH DAILY. 30 tablet 5  . lisinopril (PRINIVIL,ZESTRIL) 10 MG tablet Take 10 mg by mouth daily.    . meclizine (ANTIVERT) 12.5 MG tablet Take 1  tablet (12.5 mg total) by mouth 3 (three) times daily as needed for dizziness. 30 tablet 0  . metoCLOPramide (REGLAN) 10 MG tablet Take 1 tablet (10 mg total) by mouth every 8 (eight) hours as needed for nausea. 30 tablet 0  . midodrine (PROAMATINE) 10 MG tablet TAKE 1 TABLET BY MOUTH TWICE DAILY 60 tablet 2  . SENEXON-S 8.6-50 MG tablet TAKE 1 TABLET BY MOUTH DAILY. 100 tablet 1  . Vitamin D, Ergocalciferol, (DRISDOL) 50000 UNITS CAPS capsule Take 1 capsule (50,000 Units total) by mouth every 7 (seven) days. 10 capsule 0  . zolpidem (AMBIEN CR) 12.5 MG CR tablet TAKE 1 TABLET BY MOUTH AT BEDTIME 30 tablet 2   No current facility-administered medications on file prior to visit.    No Known Allergies  Family History  Problem Relation Age of Onset  . Vascular Disease Father 18    Deceased  . Transient ischemic attack Father   . Hypertension Mother 24    Deceased  . Liver cancer Paternal Grandfather   . Breast cancer Sister   . Heart disease Sister     Ablation for rhythm disturbance  . Healthy Brother     x1  . Other Mother     s/p PPM    Social History  Social History  . Marital Status: Married    Spouse Name: N/A  . Number of Children: 1  . Years of Education: N/A   Social History Main Topics  . Smoking status: Never Smoker   . Smokeless tobacco: None  . Alcohol Use: Yes     Comment: Rare  . Drug Use: No  . Sexual Activity: Not Asked   Other Topics Concern  . 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  They have one grown daughter.   Highest level of education:  Master's Degree   Review of Systems - See HPI.  All other ROS are negative.  BP 130/58 mmHg  Pulse 84  Temp(Src) 98.2 F (36.8 C) (Oral)  Ht _0  (1.676 m)  Wt 186 lb 3.2 oz (84.46 kg)  BMI 30.07 kg/m2  SpO2 97%  Physical Exam  Constitutional: He is oriented to person, place, and time and well-developed, well-nourished, and in no  distress.  HENT:  Head: Normocephalic and atraumatic.  Right Ear: Tympanic membrane is retracted. Tympanic membrane is not erythematous and not bulging. A middle ear effusion is present.  Left Ear: Tympanic membrane normal.  Cardiovascular: Normal rate, regular rhythm, normal heart sounds and intact distal pulses.   Pulmonary/Chest: Effort normal.  Neurological: He is alert and oriented to person, place, and time.  Skin: Skin is warm and dry. No rash noted.  Psychiatric: Affect normal.  Vitals reviewed.   Recent Results (from the past 2160 hour(s))  Hepatic function panel     Status: None   Collection Time: 01/05/15  2:56 PM  Result Value Ref Range   Total Bilirubin 0.5 0.2 - 1.2 mg/dL   Bilirubin, Direct 0.1 0.0 - 0.3 mg/dL   Alkaline Phosphatase 113 39 - 117 U/L   AST 18 0 - 37 U/L   ALT 15 0 - 53 U/L   Total Protein 6.6 6.0 - 8.3 g/dL   Albumin 3.6 3.5 - 5.2 g/dL  Vitamin D (25 hydroxy)     Status: Abnormal   Collection Time: 01/05/15  2:56 PM  Result Value Ref Range   VITD 28.06 (L) 30.00 - 100.00 ng/mL    Assessment/Plan: Eustachian tube dysfunction Rx Flonase. Supportive measures reviewed. Follow-up if not resolving.

## 2015-03-01 DIAGNOSIS — H6980 Other specified disorders of Eustachian tube, unspecified ear: Secondary | ICD-10-CM | POA: Insufficient documentation

## 2015-03-01 DIAGNOSIS — H6990 Unspecified Eustachian tube disorder, unspecified ear: Secondary | ICD-10-CM | POA: Insufficient documentation

## 2015-03-01 NOTE — Assessment & Plan Note (Signed)
Rx Flonase. Supportive measures reviewed. Follow-up if not resolving.

## 2015-03-02 ENCOUNTER — Ambulatory Visit (INDEPENDENT_AMBULATORY_CARE_PROVIDER_SITE_OTHER): Payer: Medicare Other | Admitting: Physician Assistant

## 2015-03-02 ENCOUNTER — Encounter: Payer: Self-pay | Admitting: Physician Assistant

## 2015-03-02 VITALS — BP 106/52 | HR 83 | Temp 99.1°F | Ht 66.0 in | Wt 183.8 lb

## 2015-03-02 DIAGNOSIS — B9689 Other specified bacterial agents as the cause of diseases classified elsewhere: Secondary | ICD-10-CM | POA: Insufficient documentation

## 2015-03-02 DIAGNOSIS — J019 Acute sinusitis, unspecified: Secondary | ICD-10-CM

## 2015-03-02 MED ORDER — BENZONATATE 100 MG PO CAPS: 100.0000 mg | ORAL_CAPSULE | Freq: Two times a day (BID) | ORAL | Status: DC | PRN

## 2015-03-02 MED ORDER — AMOXICILLIN-POT CLAVULANATE 875-125 MG PO TABS: 1.0000 | ORAL_TABLET | Freq: Two times a day (BID) | ORAL | Status: DC

## 2015-03-02 NOTE — Progress Notes (Signed)
Patient presents to clinic today c/o 3 days of dry cough, congestion, chills, aches and fever, acutely worsening since late last night. Endorses ear pressure bilaterally. Endorses thick phlegm and PND. Has not taken anything for symptoms.  Past Medical History  Diagnosis Date  . Diabetes type 1, uncontrolled ([place])   . Frequent headaches   . Heart attack ([place]) 09.13.2014    x5  . Hyperlipidemia   . Chicken pox   . Mumps   . Measles, [place] (rubella)   . Scarlet fever   . Diabetic retinopathy ([place])   . Cataracts, bilateral   . Orthostatic hypotension   . CAD (coronary artery disease)   . Contrast dye induced nephropathy   . OSA (obstructive sleep apnea) 07/17/2013    Current Outpatient Prescriptions on File Prior to Visit  Medication Sig Dispense Refill  . aspirin 81 MG tablet Take 81 mg by mouth daily.    Marland Kitchen atorvastatin (LIPITOR) 40 MG tablet Take 1 tablet (40 mg total) by mouth daily. 90 tablet 3  . co-enzyme Q-10 30 MG capsule Take 30 mg by mouth daily.     . fludrocortisone (FLORINEF) 0.1 MG tablet TAKE 1 TABLET BY MOUTH TWICE DAILY 60 tablet 2  . fluticasone (FLONASE) 50 MCG/ACT nasal spray Place 2 sprays into both nostrils daily. 16 g 6  . gabapentin (NEURONTIN) 600 MG tablet TAKE 1 TABLET BY MOUTH 3 TIMES DAILY. 90 tablet 2  . glucagon 1 MG injection Inject 1 mg into the vein once as needed (Emergency Test Kit).    Marland Kitchen glucose blood test strip 1 each by Other route as directed. Use as instructed FREESTYLE IN VITRO    . HYDROcodone-acetaminophen (NORCO/VICODIN) 5-325 MG tablet Take 1 tablet by mouth every 6 (six) hours as needed for severe pain. 60 tablet 0  . insulin lispro (HUMALOG) 100 UNIT/ML injection Inject into the skin as directed. Use in Insulin Pump, Max daily dose: 100 units/day    . isosorbide mononitrate (IMDUR) 30 MG 24 hr tablet TAKE 1 TABLET (30 MG TOTAL) BY MOUTH DAILY. 30 tablet 5  . lisinopril (PRINIVIL,ZESTRIL) 10 MG tablet Take 10 mg by mouth daily.    .  meclizine (ANTIVERT) 12.5 MG tablet Take 1 tablet (12.5 mg total) by mouth 3 (three) times daily as needed for dizziness. 30 tablet 0  . metoCLOPramide (REGLAN) 10 MG tablet Take 1 tablet (10 mg total) by mouth every 8 (eight) hours as needed for nausea. 30 tablet 0  . midodrine (PROAMATINE) 10 MG tablet TAKE 1 TABLET BY MOUTH TWICE DAILY 60 tablet 2  . SENEXON-S 8.6-50 MG tablet TAKE 1 TABLET BY MOUTH DAILY. 100 tablet 1  . Vitamin D, Ergocalciferol, (DRISDOL) 50000 UNITS CAPS capsule Take 1 capsule (50,000 Units total) by mouth every 7 (seven) days. 10 capsule 0  . zolpidem (AMBIEN CR) 12.5 MG CR tablet TAKE 1 TABLET BY MOUTH AT BEDTIME 30 tablet 2  . DULoxetine (CYMBALTA) 60 MG capsule TAKE 1 CAPSULE BY MOUTH DAILY. (Patient not taking: Reported on 03/02/2015) 30 capsule 3   No current facility-administered medications on file prior to visit.    No Known Allergies  Family History  Problem Relation Age of Onset  . Vascular Disease Father 59    Deceased  . Transient ischemic attack Father   . Hypertension Mother 70    Deceased  . Liver cancer Paternal Grandfather   . Breast cancer Sister   . Heart disease Sister     Ablation  for rhythm disturbance  . Healthy Brother     x1  . Other Mother     s/p PPM    Social History   Social History  . Marital Status: Married    Spouse Name: N/A  . Number of Children: 1  . Years of Education: N/A   Social History Main Topics  . Smoking status: Never Smoker   . Smokeless tobacco: None  . Alcohol Use: Yes     Comment: Rare  . Drug Use: No  . Sexual Activity: Not Asked   Other Topics Concern  . 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  They have one grown daughter.   Highest level of education:  Master's Degree   Review of Systems - See HPI.  All other ROS are negative.  BP 106/52 mmHg  Pulse 83  Temp(Src) 99.1 F (37.3 C) (Oral)  Ht 5' 6"  (1.676 m)  Wt 183 lb 12.8  oz (83.371 kg)  BMI 29.68 kg/m2  SpO2 98%  Physical Exam  Constitutional: He is oriented to person, place, and time and well-developed, well-nourished, and in no distress.  HENT:  Head: Normocephalic and atraumatic.  Right Ear: External ear normal.  Left Ear: External ear normal.  Nose: Nose normal.  Mouth/Throat: Oropharynx is clear and moist. No oropharyngeal exudate.  TM within normal limits bilaterally. + TTP sinuses  Eyes: Conjunctivae are normal.  Neck: Neck supple.  Cardiovascular: Normal rate, regular rhythm, normal heart sounds and intact distal pulses.   Pulmonary/Chest: Effort normal and breath sounds normal. No respiratory distress. He has no wheezes. He has no rales. He exhibits no tenderness.  Neurological: He is alert and oriented to person, place, and time.  Skin: Skin is warm and dry. No rash noted.  Psychiatric: Affect normal.  Vitals reviewed.  Recent Results (from the past 2160 hour(s))  Hepatic function panel     Status: None   Collection Time: 01/05/15  2:56 PM  Result Value Ref Range   Total Bilirubin 0.5 0.2 - 1.2 mg/dL   Bilirubin, Direct 0.1 0.0 - 0.3 mg/dL   Alkaline Phosphatase 113 39 - 117 U/L   AST 18 0 - 37 U/L   ALT 15 0 - 53 U/L   Total Protein 6.6 6.0 - 8.3 g/dL   Albumin 3.6 3.5 - 5.2 g/dL  Vitamin D (25 hydroxy)     Status: Abnormal   Collection Time: 01/05/15  2:56 PM  Result Value Ref Range   VITD 28.06 (L) 30.00 - 100.00 ng/mL    Assessment/Plan: Acute bacterial sinusitis Rx Augmentin.  Increase fluids.  Rest.  Saline nasal spray.  Probiotic.  Mucinex as directed.  Humidifier in bedroom. Tessalon per orders.  Call or return to clinic if symptoms are not improving.

## 2015-03-02 NOTE — Patient Instructions (Signed)
Please take antibiotic as directed.  Increase fluid intake.  Use Saline nasal spray.  Take a daily multivitamin. Use Tessalon as directed for cough.  Place a humidifier in the bedroom.  Please call or return clinic if symptoms are not improving.  Sinusitis Sinusitis is redness, soreness, and swelling (inflammation) of the paranasal sinuses. Paranasal sinuses are air pockets within the bones of your face (beneath the eyes, the middle of the forehead, or above the eyes). In healthy paranasal sinuses, mucus is able to drain out, and air is able to circulate through them by way of your nose. However, when your paranasal sinuses are inflamed, mucus and air can become trapped. This can allow bacteria and other germs to grow and cause infection. Sinusitis can develop quickly and last only a short time (acute) or continue over a long period (chronic). Sinusitis that lasts for more than 12 weeks is considered chronic.  CAUSES  Causes of sinusitis include:  Allergies.  Structural abnormalities, such as displacement of the cartilage that separates your nostrils (deviated septum), which can decrease the air flow through your nose and sinuses and affect sinus drainage.  Functional abnormalities, such as when the small hairs (cilia) that line your sinuses and help remove mucus do not work properly or are not present. SYMPTOMS  Symptoms of acute and chronic sinusitis are the same. The primary symptoms are pain and pressure around the affected sinuses. Other symptoms include:  Upper toothache.  Earache.  Headache.  Bad breath.  Decreased sense of smell and taste.  A cough, which worsens when you are lying flat.  Fatigue.  Fever.  Thick drainage from your nose, which often is green and may contain pus (purulent).  Swelling and warmth over the affected sinuses. DIAGNOSIS  Your caregiver will perform a physical exam. During the exam, your caregiver may:  Look in your nose for signs of abnormal  growths in your nostrils (nasal polyps).  Tap over the affected sinus to check for signs of infection.  View the inside of your sinuses (endoscopy) with a special imaging device with a light attached (endoscope), which is inserted into your sinuses. If your caregiver suspects that you have chronic sinusitis, one or more of the following tests may be recommended:  Allergy tests.  Nasal culture A sample of mucus is taken from your nose and sent to a lab and screened for bacteria.  Nasal cytology A sample of mucus is taken from your nose and examined by your caregiver to determine if your sinusitis is related to an allergy. TREATMENT  Most cases of acute sinusitis are related to a viral infection and will resolve on their own within 10 days. Sometimes medicines are prescribed to help relieve symptoms (pain medicine, decongestants, nasal steroid sprays, or saline sprays).  However, for sinusitis related to a bacterial infection, your caregiver will prescribe antibiotic medicines. These are medicines that will help kill the bacteria causing the infection.  Rarely, sinusitis is caused by a fungal infection. In theses cases, your caregiver will prescribe antifungal medicine. For some cases of chronic sinusitis, surgery is needed. Generally, these are cases in which sinusitis recurs more than 3 times per year, despite other treatments. HOME CARE INSTRUCTIONS   Drink plenty of water. Water helps thin the mucus so your sinuses can drain more easily.  Use a humidifier.  Inhale steam 3 to 4 times a day (for example, sit in the bathroom with the shower running).  Apply a warm, moist washcloth to your face   3 to 4 times a day, or as directed by your caregiver.  Use saline nasal sprays to help moisten and clean your sinuses.  Take over-the-counter or prescription medicines for pain, discomfort, or fever only as directed by your caregiver. SEEK IMMEDIATE MEDICAL CARE IF:  You have increasing pain or  severe headaches.  You have nausea, vomiting, or drowsiness.  You have swelling around your face.  You have vision problems.  You have a stiff neck.  You have difficulty breathing. MAKE SURE YOU:   Understand these instructions.  Will watch your condition.  Will get help right away if you are not doing well or get worse. Document Released: 02/28/2005 Document Revised: 05/23/2011 Document Reviewed: 03/15/2011 Exit[employer] Patient Information 2014 [employer].   

## 2015-03-02 NOTE — Assessment & Plan Note (Signed)
Rx Augmentin.  Increase fluids.  Rest.  Saline nasal spray.  Probiotic.  Mucinex as directed.  Humidifier in bedroom. Tessalon per orders.  Call or return to clinic if symptoms are not improving.  

## 2015-03-02 NOTE — Progress Notes (Signed)
Pre visit review using our clinic review tool, if applicable. No additional management support is needed unless otherwise documented below in the visit note. 

## 2015-03-04 ENCOUNTER — Encounter: Payer: Self-pay | Admitting: *Deleted

## 2015-03-13 ENCOUNTER — Telehealth: Payer: Self-pay | Admitting: Physician Assistant

## 2015-03-13 MED ORDER — ZOLPIDEM TARTRATE ER 12.5 MG PO TBCR: 12.5000 mg | EXTENDED_RELEASE_TABLET | Freq: Every day | ORAL | Status: DC

## 2015-03-13 NOTE — Telephone Encounter (Signed)
Rx printed,signed and faxed to the pharmacy.//AB/CMA 

## 2015-03-13 NOTE — Telephone Encounter (Signed)
Caller name: Maisie Fushomas   Relationship to patient: Self   Can be reached: [phone]  Pharmacy: [facility] - [place] - [address]  Reason for call: Pt is requesting a refill on his zolpidem Rx. He says that the pharmacy has been waiting for approval for 2 days.

## 2015-03-13 NOTE — Telephone Encounter (Signed)
Requesting Zolpidem 12.5mg -Take 1 tablet by mouth at bedtime. Last refill:12/08/14;#30,2 Last OV:03/02/15 Please advise.//AB/CMA

## 2015-03-13 NOTE — Telephone Encounter (Signed)
Refill x1 only 

## 2015-03-17 ENCOUNTER — Emergency Department (HOSPITAL_BASED_OUTPATIENT_CLINIC_OR_DEPARTMENT_OTHER): Payer: Medicare Other

## 2015-03-17 ENCOUNTER — Encounter (HOSPITAL_BASED_OUTPATIENT_CLINIC_OR_DEPARTMENT_OTHER): Payer: Self-pay | Admitting: *Deleted

## 2015-03-17 ENCOUNTER — Inpatient Hospital Stay (HOSPITAL_BASED_OUTPATIENT_CLINIC_OR_DEPARTMENT_OTHER)
Admission: EM | Admit: 2015-03-17 | Discharge: 2015-03-20 | DRG: 280 | Disposition: A | Discharge status: Home / Self Care | Payer: Medicare Other | Attending: Cardiology | Admitting: Cardiology

## 2015-03-17 DIAGNOSIS — Z9641 Presence of insulin pump (external) (internal): Secondary | ICD-10-CM | POA: Diagnosis present

## 2015-03-17 DIAGNOSIS — R69 Illness, unspecified: Secondary | ICD-10-CM | POA: Diagnosis not present

## 2015-03-17 DIAGNOSIS — I214 Non-ST elevation (NSTEMI) myocardial infarction: Secondary | ICD-10-CM | POA: Diagnosis not present

## 2015-03-17 DIAGNOSIS — I5031 Acute diastolic (congestive) heart failure: Secondary | ICD-10-CM | POA: Diagnosis present

## 2015-03-17 DIAGNOSIS — E785 Hyperlipidemia, unspecified: Secondary | ICD-10-CM | POA: Diagnosis present

## 2015-03-17 DIAGNOSIS — Z79899 Other long term (current) drug therapy: Secondary | ICD-10-CM

## 2015-03-17 DIAGNOSIS — Z8249 Family history of ischemic heart disease and other diseases of the circulatory system: Secondary | ICD-10-CM | POA: Diagnosis not present

## 2015-03-17 DIAGNOSIS — I252 Old myocardial infarction: Secondary | ICD-10-CM

## 2015-03-17 DIAGNOSIS — Z955 Presence of coronary angioplasty implant and graft: Secondary | ICD-10-CM

## 2015-03-17 DIAGNOSIS — E10319 Type 1 diabetes mellitus with unspecified diabetic retinopathy without macular edema: Secondary | ICD-10-CM | POA: Diagnosis present

## 2015-03-17 DIAGNOSIS — Z7982 Long term (current) use of aspirin: Secondary | ICD-10-CM

## 2015-03-17 DIAGNOSIS — E876 Hypokalemia: Secondary | ICD-10-CM | POA: Diagnosis present

## 2015-03-17 DIAGNOSIS — G4733 Obstructive sleep apnea (adult) (pediatric): Secondary | ICD-10-CM | POA: Diagnosis present

## 2015-03-17 DIAGNOSIS — Z794 Long term (current) use of insulin: Secondary | ICD-10-CM

## 2015-03-17 DIAGNOSIS — I2510 Atherosclerotic heart disease of native coronary artery without angina pectoris: Secondary | ICD-10-CM | POA: Diagnosis not present

## 2015-03-17 DIAGNOSIS — N141 Nephropathy induced by other drugs, medicaments and biological substances: Secondary | ICD-10-CM | POA: Diagnosis present

## 2015-03-17 DIAGNOSIS — I951 Orthostatic hypotension: Secondary | ICD-10-CM | POA: Diagnosis present

## 2015-03-17 DIAGNOSIS — T508X5A Adverse effect of diagnostic agents, initial encounter: Secondary | ICD-10-CM | POA: Diagnosis present

## 2015-03-17 LAB — CBC WITH DIFFERENTIAL/PLATELET
BASOS ABS: 0 10*3/uL (ref 0.0–0.1)
BASOS PCT: 0 %
EOS PCT: 0 %
Eosinophils Absolute: 0 10*3/uL (ref 0.0–0.7)
HCT: 38 % — ABNORMAL LOW (ref 39.0–52.0)
Hemoglobin: 12.4 g/dL — ABNORMAL LOW (ref 13.0–17.0)
Lymphocytes Relative: 13 %
Lymphs Abs: 1.1 10*3/uL (ref 0.7–4.0)
MCH: 28 pg (ref 26.0–34.0)
MCHC: 32.6 g/dL (ref 30.0–36.0)
MCV: 85.8 fL (ref 78.0–100.0)
MONO ABS: 0.8 10*3/uL (ref 0.1–1.0)
Monocytes Relative: 10 %
Neutro Abs: 6.3 10*3/uL (ref 1.7–7.7)
Neutrophils Relative %: 77 %
PLATELETS: 218 10*3/uL (ref 150–400)
RBC: 4.43 MIL/uL (ref 4.22–5.81)
RDW: 12.6 % (ref 11.5–15.5)
WBC: 8.3 10*3/uL (ref 4.0–10.5)

## 2015-03-17 LAB — COMPREHENSIVE METABOLIC PANEL
ALK PHOS: 110 U/L (ref 38–126)
ALT: 17 U/L (ref 17–63)
ANION GAP: 9 (ref 5–15)
AST: 30 U/L (ref 15–41)
Albumin: 3.7 g/dL (ref 3.5–5.0)
BUN: 15 mg/dL (ref 6–20)
CALCIUM: 8.3 mg/dL — AB (ref 8.9–10.3)
CHLORIDE: 101 mmol/L (ref 101–111)
CO2: 28 mmol/L (ref 22–32)
CREATININE: 1.46 mg/dL — AB (ref 0.61–1.24)
GFR, EST AFRICAN AMERICAN: 58 mL/min — AB (ref >60)
GFR, EST NON AFRICAN AMERICAN: 50 mL/min — AB (ref >60)
Glucose, Bld: 184 mg/dL — ABNORMAL HIGH (ref 65–99)
Potassium: 2.9 mmol/L — ABNORMAL LOW (ref 3.5–5.1)
SODIUM: 138 mmol/L (ref 135–145)
Total Bilirubin: 1 mg/dL (ref 0.3–1.2)
Total Protein: 7 g/dL (ref 6.5–8.1)

## 2015-03-17 LAB — BRAIN NATRIURETIC PEPTIDE: B NATRIURETIC PEPTIDE 5: 451.1 pg/mL — AB (ref 0.0–100.0)

## 2015-03-17 LAB — TROPONIN I: TROPONIN I: 3.99 ng/mL — AB (ref <0.031)

## 2015-03-17 LAB — CBG MONITORING, ED: Glucose-Capillary: 181 mg/dL — ABNORMAL HIGH (ref 65–99)

## 2015-03-17 LAB — LIPASE, BLOOD: LIPASE: 14 U/L (ref 11–51)

## 2015-03-17 MED ORDER — ENOXAPARIN SODIUM 100 MG/ML ~~LOC~~ SOLN
1.0000 mg/kg | Freq: Once | SUBCUTANEOUS | Status: AC
  Administered 2015-03-17: 85 mg via SUBCUTANEOUS
  Filled 2015-03-17: qty 1

## 2015-03-17 MED ORDER — POTASSIUM CHLORIDE CRYS ER 20 MEQ PO TBCR
40.0000 meq | EXTENDED_RELEASE_TABLET | Freq: Once | ORAL | Status: AC
  Administered 2015-03-17: 40 meq via ORAL
  Filled 2015-03-17: qty 2

## 2015-03-17 MED ORDER — ASPIRIN 81 MG PO CHEW
324.0000 mg | CHEWABLE_TABLET | Freq: Once | ORAL | Status: AC
  Administered 2015-03-17: 324 mg via ORAL
  Filled 2015-03-17: qty 4

## 2015-03-17 NOTE — ED Notes (Signed)
Cough, chills, sinus drainage and headache x 2 weeks.

## 2015-03-17 NOTE — ED Provider Notes (Signed)
CSN: [id number]     Arrival date & time 03/17/15  1539 History   By signing my name below, I, Forrestine Him, attest that this documentation has been prepared under the direction and in the presence of Charlesetta Shanks, MD.  Electronically Signed: Forrestine Him, ED Scribe. 03/17/2015. 7:03 PM.   Chief Complaint  Patient presents with  . URI   The history is provided by the patient. No language interpreter was used.    HPI Comments: Timothy Alvarez is a 60 y.o. male with a PMHx of DM and CAD who presents to the Emergency Department here for a possible URI this evening. Pt c/o constant, ongoing HA, sinus drainage, postnasal drip, chills, and cough. No aggravating or alleviating factors at this time. Pt was evaluated upstairs at PCP office on 03/02/15 and was started on Augmentin. He denies any improvement after starting and completing medication. No recent nausea, vomiting, abdominal pain, diarrhea, chest pain, or shortness of breath. Pt denies any recent history of similar symptoms in the last several months. Mr. Glasheen states his blood sugars are well controlled with most recent reading in the 100s. No previous history of COPD. He is not an every day smoker. Pt did get Flu vaccination this season.  PCP: Leeanne Rio, PA-C    Past Medical History  Diagnosis Date  . Diabetes type 1, uncontrolled ([place])   . Frequent headaches   . Heart attack ([place]) 09.13.2014    x5  . Hyperlipidemia   . Chicken pox   . Mumps   . Measles, [place] (rubella)   . Scarlet fever   . Diabetic retinopathy ([place])   . Cataracts, bilateral   . Orthostatic hypotension   . CAD (coronary artery disease)   . Contrast dye induced nephropathy   . OSA (obstructive sleep apnea) 07/17/2013   Past Surgical History  Procedure Laterality Date  . Coronary angioplasty with stent placement    . Appendectomy  1966  . Wisdom tooth extraction    . Refractive surgery      Retinopathy   Family History  Problem Relation Age of  Onset  . Vascular Disease Father 59    Deceased  . Transient ischemic attack Father   . Hypertension Mother 31    Deceased  . Liver cancer Paternal Grandfather   . Breast cancer Sister   . Heart disease Sister     Ablation for rhythm disturbance  . Healthy Brother     x1  . Other Mother     s/p PPM   Social History  Substance Use Topics  . Smoking status: Never Smoker   . Smokeless tobacco: None  . Alcohol Use: Yes     Comment: Rare    Review of Systems   A complete 10 system review of systems was obtained and all systems are negative except as noted in the HPI and PMH.    Allergies  Review of patient's allergies indicates no known allergies.  Home Medications   Prior to Admission medications   Medication Sig Start Date End Date Taking? Authorizing Provider  acetaminophen (TYLENOL) 325 MG tablet Take 650 mg by mouth every 6 (six) hours as needed for mild pain.   Yes Historical Provider, MD  aspirin 81 MG tablet Take 81 mg by mouth daily.   Yes Historical Provider, MD  atorvastatin (LIPITOR) 40 MG tablet Take 1 tablet (40 mg total) by mouth daily. 11/25/14 11/25/15 Yes Lelon Perla, MD  benzonatate (TESSALON) 100 MG capsule  Take 1 capsule (100 mg total) by mouth 2 (two) times daily as needed for cough. 03/02/15  Yes Brunetta Jeans, PA-C  co-enzyme Q-10 30 MG capsule Take 30 mg by mouth daily.    Yes Historical Provider, MD  fludrocortisone (FLORINEF) 0.1 MG tablet TAKE 1 TABLET BY MOUTH TWICE DAILY 02/02/15  Yes Brunetta Jeans, PA-C  fluticasone [facility]) 50 MCG/ACT nasal spray Place 2 sprays into both nostrils daily. 02/27/15  Yes Brunetta Jeans, PA-C  gabapentin (NEURONTIN) 600 MG tablet TAKE 1 TABLET BY MOUTH 3 TIMES DAILY. Patient taking differently: TAKE 1 TABLET BY MOUTH TWICE DAILY 02/27/15  Yes Brunetta Jeans, PA-C  glucagon 1 MG injection Inject 1 mg into the vein once as needed (Emergency Test Kit).   Yes Historical Provider, MD  glucose blood test  strip 1 each by Other route as directed. Use as instructed FREESTYLE IN VITRO   Yes Historical Provider, MD  HYDROcodone-acetaminophen (NORCO/VICODIN) 5-325 MG tablet Take 1 tablet by mouth every 6 (six) hours as needed for severe pain. 01/05/15  Yes Brunetta Jeans, PA-C  insulin lispro (HUMALOG) 100 UNIT/ML injection Inject 1-40 Units into the skin as directed. Use in Insulin Pump, Max daily dose: 100 units/day   Yes Historical Provider, MD  isosorbide mononitrate (IMDUR) 30 MG 24 hr tablet TAKE 1 TABLET (30 MG TOTAL) BY MOUTH DAILY. 01/26/15  Yes Lelon Perla, MD  lisinopril (PRINIVIL,ZESTRIL) 10 MG tablet Take 10 mg by mouth daily.   Yes Historical Provider, MD  meclizine (ANTIVERT) 12.5 MG tablet Take 1 tablet (12.5 mg total) by mouth 3 (three) times daily as needed for dizziness. 02/27/14  Yes Edward Saguier, PA-C  metoCLOPramide (REGLAN) 10 MG tablet Take 1 tablet (10 mg total) by mouth every 8 (eight) hours as needed for nausea. 01/06/14  Yes Brunetta Jeans, PA-C  midodrine (PROAMATINE) 10 MG tablet TAKE 1 TABLET BY MOUTH TWICE DAILY 02/02/15  Yes Brunetta Jeans, PA-C  Pseudoephedrine-APAP-DM (DAYQUIL MULTI-SYMPTOM COLD/FLU PO) Take 1 capsule by mouth every 6 (six) hours as needed (cold/flu symptoms).   Yes Historical Provider, MD  SENEXON-S 8.6-50 MG tablet TAKE 1 TABLET BY MOUTH DAILY. 02/13/15  Yes Brunetta Jeans, PA-C  zolpidem (AMBIEN CR) 12.5 MG CR tablet Take 1 tablet (12.5 mg total) by mouth at bedtime. 03/13/15  Yes Rosalita Chessman, DO   Triage Vitals: BP 161/86 mmHg  Pulse 86  Temp(Src) 98.8 F (37.1 C) (Oral)  Resp 12  Ht 5' 6"  (1.676 m)  Wt 178 lb 8 oz (80.967 kg)  BMI 28.82 kg/m2  SpO2 97%   Physical Exam  Constitutional: He is oriented to person, place, and time.  The patient is alert and nontoxic. He does appear mildly deconditioned. No respiratory distress at rest.  HENT:  Head: Normocephalic and atraumatic.  Mouth/Throat: Oropharynx is clear and moist.   Eyes: EOM are normal. Pupils are equal, round, and reactive to light.  Neck: Neck supple.  Cardiovascular: Normal rate, regular rhythm, normal heart sounds and intact distal pulses.   Pulmonary/Chest: Effort normal and breath sounds normal.  Abdominal: Soft. Bowel sounds are normal. He exhibits no distension. There is no tenderness.  Musculoskeletal: Normal range of motion. He exhibits no edema or tenderness.  Diffuse muscular atrophy, particularly in the hands.  Neurological: He is alert and oriented to person, place, and time. He has normal strength. Coordination normal. GCS eye subscore is 4. GCS verbal subscore is 5. GCS motor subscore is  6.  Skin: Skin is warm, dry and intact. There is pallor.  Psychiatric: He has a normal mood and affect.    ED Course  Procedures (including critical care time) CRITICAL CARE Performed by: Charlesetta Shanks   Total critical care time: 45 minutes  Critical care time was exclusive of separately billable procedures and treating other patients.  Critical care was necessary to treat or prevent imminent or life-threatening deterioration.  Critical care was time spent personally by me on the following activities: development of treatment plan with patient and/or surrogate as well as nursing, discussions with consultants, evaluation of patient's response to treatment, examination of patient, obtaining history from patient or surrogate, ordering and performing treatments and interventions, ordering and review of laboratory studies, ordering and review of radiographic studies, pulse oximetry and re-evaluation of patient's condition. Marland Kitchen  DIAGNOSTIC STUDIES: Oxygen Saturation is 96% on RA, adequate by my interpretation.    COORDINATION OF CARE: 6:43 PM- Will order CBG. Discussed treatment plan with pt at bedside and pt agreed to plan.     Labs Review  Imaging Review Dg Chest 2 View  03/17/2015  CLINICAL DATA:  Cough and chills.  Persistent symptoms for 2  weeks. EXAM: CHEST  2 VIEW COMPARISON:  04/24/2013 FINDINGS: Heart at the upper limits normal in size, however increased from prior exam. Increased interstitial thickening from prior. No consolidation, pleural effusion, or pneumothorax. No acute osseous abnormalities are seen. IMPRESSION: Increased interstitial thickening, pulmonary edema versus atypical infection/bronchitis. Heart is at the upper limits normal in size, however the has been minimal increase in size from prior. Electronically Signed   By: Jeb Levering M.D.   On: 03/17/2015 20:53   I have personally reviewed and evaluated these images and lab results as part of my medical decision-making.   EKG Interpretation   Date/Time:  Tuesday March 17 2015 19:18:31 EST Ventricular Rate:  79 PR Interval:  176 QRS Duration: 94 QT Interval:  414 QTC Calculation: 474 R Axis:   -58 Text Interpretation:  Normal sinus rhythm Left axis deviation Minimal  voltage criteria for LVH, may be normal variant Abnormal ECG agree. no  ischemic change. no change from old. Confirmed by Johnney Killian, MD, Jeannie Done  [phone]) on 03/17/2015 7:47:38 PM     Consult: Patient's case was reviewed Dr. Aundra Dubin of cardiology. He except the patient for admission. MDM   Final diagnoses:  Acute non-ST elevation myocardial infarction (NSTEMI) ([place])  Severe comorbid illness   Patient presents with URI-type symptoms for several weeks duration. He felt these were not improving. Patient has severe comorbid illness of cardiac disease and insulin-dependent diabetes. Of note patient's troponin was elevated consistent with silent MI. EKG does not show acute ST-T wave changes. Patient stated his typical cardiac symptoms or nausea and vomiting but not chest pain. He however also had not experienced significant nausea or vomiting. He has been given Lovenox and aspirin. Currently there does not appear to be acute bacterial infection. Patient does not have leukocytosis or fever and chest  x-ray does not show focal consolidation. Describes symptoms are suggestive of URI with nasal congestion, drainage and dry cough.. Antibiotics were not initiated at this time.  Charlesetta Shanks, MD 03/18/15 [phone]

## 2015-03-17 NOTE — ED Notes (Signed)
md notified of troponin  3.99

## 2015-03-17 NOTE — Progress Notes (Signed)
HPI: FU CAD. Patient previously lived in [place]. He had his first myocardial infarction in 2010. He has had subsequent events. He moved here in May of 2014. Echocardiogram October 2013 showed normal LV function and grade 2 diastolic dysfunction. Last cardiac catheterization in September 2014 and had stent to the mid right coronary artery. There was note of a mid-90% LAD; small caliber and not suitable for PCI or grafting. Ejection fraction 60%. Procedure complicated by contrast nephropathy requiring dialysis. Carotid Dopplers February 2015 showed mild focal plaques in both carotid bifurcations but no hemodynamically significant stenosis. Also with h/o orthostatic hypotension. Since last seen,    Current Outpatient Prescriptions  Medication Sig Dispense Refill  . amoxicillin-clavulanate (AUGMENTIN) 875-125 MG tablet Take 1 tablet by mouth 2 (two) times daily. 14 tablet 0  . aspirin 81 MG tablet Take 81 mg by mouth daily.    Marland Kitchen atorvastatin (LIPITOR) 40 MG tablet Take 1 tablet (40 mg total) by mouth daily. 90 tablet 3  . benzonatate (TESSALON) 100 MG capsule Take 1 capsule (100 mg total) by mouth 2 (two) times daily as needed for cough. 20 capsule 0  . co-enzyme Q-10 30 MG capsule Take 30 mg by mouth daily.     . DULoxetine (CYMBALTA) 60 MG capsule TAKE 1 CAPSULE BY MOUTH DAILY. (Patient not taking: Reported on 03/02/2015) 30 capsule 3  . fludrocortisone (FLORINEF) 0.1 MG tablet TAKE 1 TABLET BY MOUTH TWICE DAILY 60 tablet 2  . fluticasone (FLONASE) 50 MCG/ACT nasal spray Place 2 sprays into both nostrils daily. 16 g 6  . gabapentin (NEURONTIN) 600 MG tablet TAKE 1 TABLET BY MOUTH 3 TIMES DAILY. 90 tablet 2  . glucagon 1 MG injection Inject 1 mg into the vein once as needed (Emergency Test Kit).    Marland Kitchen glucose blood test strip 1 each by Other route as directed. Use as instructed FREESTYLE IN VITRO    . HYDROcodone-acetaminophen (NORCO/VICODIN) 5-325 MG tablet Take 1 tablet by mouth every 6  (six) hours as needed for severe pain. 60 tablet 0  . insulin lispro (HUMALOG) 100 UNIT/ML injection Inject into the skin as directed. Use in Insulin Pump, Max daily dose: 100 units/day    . isosorbide mononitrate (IMDUR) 30 MG 24 hr tablet TAKE 1 TABLET (30 MG TOTAL) BY MOUTH DAILY. 30 tablet 5  . lisinopril (PRINIVIL,ZESTRIL) 10 MG tablet Take 10 mg by mouth daily.    . meclizine (ANTIVERT) 12.5 MG tablet Take 1 tablet (12.5 mg total) by mouth 3 (three) times daily as needed for dizziness. 30 tablet 0  . metoCLOPramide (REGLAN) 10 MG tablet Take 1 tablet (10 mg total) by mouth every 8 (eight) hours as needed for nausea. 30 tablet 0  . midodrine (PROAMATINE) 10 MG tablet TAKE 1 TABLET BY MOUTH TWICE DAILY 60 tablet 2  . SENEXON-S 8.6-50 MG tablet TAKE 1 TABLET BY MOUTH DAILY. 100 tablet 1  . Vitamin D, Ergocalciferol, (DRISDOL) 50000 UNITS CAPS capsule Take 1 capsule (50,000 Units total) by mouth every 7 (seven) days. 10 capsule 0  . zolpidem (AMBIEN CR) 12.5 MG CR tablet Take 1 tablet (12.5 mg total) by mouth at bedtime. 30 tablet 0   No current facility-administered medications for this visit.     Past Medical History  Diagnosis Date  . Diabetes type 1, uncontrolled ([place])   . Frequent headaches   . Heart attack ([place]) 09.13.2014    x5  . Hyperlipidemia   . Chicken pox   .  Mumps   . Measles, [place] (rubella)   . Scarlet fever   . Diabetic retinopathy ([place])   . Cataracts, bilateral   . Orthostatic hypotension   . CAD (coronary artery disease)   . Contrast dye induced nephropathy   . OSA (obstructive sleep apnea) 07/17/2013    Past Surgical History  Procedure Laterality Date  . Coronary angioplasty with stent placement    . Appendectomy  1966  . Wisdom tooth extraction    . Refractive surgery      Retinopathy    Social History   Social History  . Marital Status: Married    Spouse Name: N/A  . Number of Children: 1  . Years of Education: N/A   Occupational History  .  Not on file.   Social History Main Topics  . Smoking status: Never Smoker   . Smokeless tobacco: Not on file  . Alcohol Use: Yes     Comment: Rare  . Drug Use: No  . Sexual Activity: Not on file   Other Topics Concern  . 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  They have one grown daughter.   Highest level of education:  Master's Degree    ROS: no fevers or chills, productive cough, hemoptysis, dysphasia, odynophagia, melena, hematochezia, dysuria, hematuria, rash, seizure activity, orthopnea, PND, pedal edema, claudication. Remaining systems are negative.  Physical Exam: Well-developed well-nourished in no acute distress.  Skin is warm and dry.  HEENT is normal.  Neck is supple.  Chest is clear to auscultation with normal expansion.  Cardiovascular exam is regular rate and rhythm.  Abdominal exam nontender or distended. No masses palpated. Extremities show no edema. neuro grossly intact  ECG     This encounter was created in error - please disregard.

## 2015-03-17 NOTE — ED Notes (Signed)
MD at bedside. 

## 2015-03-18 ENCOUNTER — Encounter (HOSPITAL_COMMUNITY)
Admission: EM | Disposition: A | Discharge status: Home / Self Care | Payer: Medicare Other | Source: Home / Self Care | Attending: Cardiology

## 2015-03-18 ENCOUNTER — Encounter: Payer: Medicare Other | Admitting: Cardiology

## 2015-03-18 DIAGNOSIS — I2510 Atherosclerotic heart disease of native coronary artery without angina pectoris: Secondary | ICD-10-CM

## 2015-03-18 DIAGNOSIS — I214 Non-ST elevation (NSTEMI) myocardial infarction: Secondary | ICD-10-CM | POA: Diagnosis present

## 2015-03-18 DIAGNOSIS — R69 Illness, unspecified: Secondary | ICD-10-CM | POA: Insufficient documentation

## 2015-03-18 HISTORY — PX: CARDIAC CATHETERIZATION: SHX172

## 2015-03-18 LAB — BASIC METABOLIC PANEL
Anion gap: 13 (ref 5–15)
BUN: 17 mg/dL (ref 6–20)
CHLORIDE: 99 mmol/L — AB (ref 101–111)
CO2: 25 mmol/L (ref 22–32)
Calcium: 8.4 mg/dL — ABNORMAL LOW (ref 8.9–10.3)
Creatinine, Ser: 1.58 mg/dL — ABNORMAL HIGH (ref 0.61–1.24)
GFR calc Af Amer: 53 mL/min — ABNORMAL LOW (ref >60)
GFR, EST NON AFRICAN AMERICAN: 46 mL/min — AB (ref >60)
GLUCOSE: 418 mg/dL — AB (ref 65–99)
POTASSIUM: 3.1 mmol/L — AB (ref 3.5–5.1)
Sodium: 137 mmol/L (ref 135–145)

## 2015-03-18 LAB — INFLUENZA PANEL BY PCR (TYPE A & B)
H1N1FLUPCR: NOT DETECTED
INFLAPCR: NEGATIVE
INFLBPCR: NEGATIVE

## 2015-03-18 LAB — PROTIME-INR
INR: 1.24 (ref 0.00–1.49)
PROTHROMBIN TIME: 15.8 s — AB (ref 11.6–15.2)

## 2015-03-18 LAB — GLUCOSE, CAPILLARY
GLUCOSE-CAPILLARY: 382 mg/dL — AB (ref 65–99)
GLUCOSE-CAPILLARY: 440 mg/dL — AB (ref 65–99)
GLUCOSE-CAPILLARY: 406 mg/dL — AB (ref 65–99)
Glucose-Capillary: 224 mg/dL — ABNORMAL HIGH (ref 65–99)
Glucose-Capillary: 175 mg/dL — ABNORMAL HIGH (ref 65–99)
Glucose-Capillary: 198 mg/dL — ABNORMAL HIGH (ref 65–99)

## 2015-03-18 LAB — CBC
HEMATOCRIT: 36.9 % — AB (ref 39.0–52.0)
Hemoglobin: 12.5 g/dL — ABNORMAL LOW (ref 13.0–17.0)
MCH: 28.7 pg (ref 26.0–34.0)
MCHC: 33.9 g/dL (ref 30.0–36.0)
MCV: 84.6 fL (ref 78.0–100.0)
Platelets: 200 10*3/uL (ref 150–400)
RBC: 4.36 MIL/uL (ref 4.22–5.81)
RDW: 13 % (ref 11.5–15.5)
WBC: 7.9 10*3/uL (ref 4.0–10.5)

## 2015-03-18 LAB — URINE MICROSCOPIC-ADD ON

## 2015-03-18 LAB — LIPID PANEL
Cholesterol: 113 mg/dL (ref 0–200)
HDL: 32 mg/dL — ABNORMAL LOW (ref >40)
LDL CALC: 66 mg/dL (ref 0–99)
Total CHOL/HDL Ratio: 3.5 RATIO
Triglycerides: 74 mg/dL (ref <150)
VLDL: 15 mg/dL (ref 0–40)

## 2015-03-18 LAB — HEPARIN LEVEL (UNFRACTIONATED): Heparin Unfractionated: 0.7 IU/mL (ref 0.30–0.70)

## 2015-03-18 LAB — URINALYSIS, ROUTINE W REFLEX MICROSCOPIC
BILIRUBIN URINE: NEGATIVE
GLUCOSE, UA: 500 mg/dL — AB
KETONES UR: 15 mg/dL — AB
Leukocytes, UA: NEGATIVE
Nitrite: NEGATIVE
PH: 6 (ref 5.0–8.0)
PROTEIN: 100 mg/dL — AB
Specific Gravity, Urine: 1.014 (ref 1.005–1.030)

## 2015-03-18 LAB — TROPONIN I
TROPONIN I: 2.9 ng/mL — AB (ref <0.031)
Troponin I: 1.27 ng/mL (ref <0.031)

## 2015-03-18 LAB — BRAIN NATRIURETIC PEPTIDE: B Natriuretic Peptide: 411.7 pg/mL — ABNORMAL HIGH (ref 0.0–100.0)

## 2015-03-18 LAB — MRSA PCR SCREENING: MRSA BY PCR: POSITIVE — AB

## 2015-03-18 SURGERY — LEFT HEART CATH AND CORONARY ANGIOGRAPHY
Anesthesia: LOCAL

## 2015-03-18 MED ORDER — CETYLPYRIDINIUM CHLORIDE 0.05 % MT LIQD
7.0000 mL | Freq: Two times a day (BID) | OROMUCOSAL | Status: DC
  Administered 2015-03-18 – 2015-03-20 (×4): 7 mL via OROMUCOSAL

## 2015-03-18 MED ORDER — HEPARIN (PORCINE) IN NACL 100-0.45 UNIT/ML-% IJ SOLN
1100.0000 [IU]/h | INTRAMUSCULAR | Status: DC
  Administered 2015-03-19: 1000 [IU]/h via INTRAVENOUS
  Filled 2015-03-18: qty 250

## 2015-03-18 MED ORDER — MIDODRINE HCL 5 MG PO TABS
10.0000 mg | ORAL_TABLET | Freq: Two times a day (BID) | ORAL | Status: DC
  Filled 2015-03-18: qty 2

## 2015-03-18 MED ORDER — ONDANSETRON HCL 4 MG/2ML IJ SOLN: 4.0000 mg | Freq: Four times a day (QID) | INTRAMUSCULAR | Status: DC | PRN

## 2015-03-18 MED ORDER — LIDOCAINE HCL (PF) 1 % IJ SOLN
INTRAMUSCULAR | Status: AC
  Filled 2015-03-18: qty 30

## 2015-03-18 MED ORDER — ASPIRIN 81 MG PO CHEW
81.0000 mg | CHEWABLE_TABLET | ORAL | Status: AC
  Administered 2015-03-18: 81 mg via ORAL
  Filled 2015-03-18: qty 1

## 2015-03-18 MED ORDER — IOHEXOL 350 MG/ML SOLN
INTRAVENOUS | Status: DC | PRN
  Administered 2015-03-18: 75 mL via INTRA_ARTERIAL

## 2015-03-18 MED ORDER — SODIUM CHLORIDE 0.9 % IV SOLN: 250.0000 mL | INTRAVENOUS | Status: DC | PRN

## 2015-03-18 MED ORDER — FENTANYL CITRATE (PF) 100 MCG/2ML IJ SOLN
INTRAMUSCULAR | Status: DC | PRN
  Administered 2015-03-18: 50 ug via INTRAVENOUS

## 2015-03-18 MED ORDER — VERAPAMIL HCL 2.5 MG/ML IV SOLN
INTRAVENOUS | Status: AC
  Filled 2015-03-18: qty 2

## 2015-03-18 MED ORDER — HEPARIN SODIUM (PORCINE) 1000 UNIT/ML IJ SOLN
INTRAMUSCULAR | Status: DC | PRN
  Administered 2015-03-18: 4000 [IU] via INTRAVENOUS

## 2015-03-18 MED ORDER — FUROSEMIDE 10 MG/ML IJ SOLN
20.0000 mg | Freq: Once | INTRAMUSCULAR | Status: AC
  Administered 2015-03-18: 20 mg via INTRAVENOUS
  Filled 2015-03-18: qty 2

## 2015-03-18 MED ORDER — SODIUM CHLORIDE 0.9 % IJ SOLN
3.0000 mL | INTRAMUSCULAR | Status: DC | PRN
  Filled 2015-03-18: qty 3

## 2015-03-18 MED ORDER — HEPARIN SODIUM (PORCINE) 1000 UNIT/ML IJ SOLN
INTRAMUSCULAR | Status: AC
  Filled 2015-03-18: qty 1

## 2015-03-18 MED ORDER — ASPIRIN 300 MG RE SUPP: 300.0000 mg | RECTAL | Status: DC

## 2015-03-18 MED ORDER — HEPARIN BOLUS VIA INFUSION
4000.0000 [IU] | Freq: Once | INTRAVENOUS | Status: AC
  Administered 2015-03-18: 4000 [IU] via INTRAVENOUS

## 2015-03-18 MED ORDER — MIDODRINE HCL 5 MG PO TABS
10.0000 mg | ORAL_TABLET | Freq: Two times a day (BID) | ORAL | Status: DC
  Administered 2015-03-18 – 2015-03-20 (×4): 10 mg via ORAL
  Filled 2015-03-18 (×4): qty 2

## 2015-03-18 MED ORDER — METOPROLOL TARTRATE 1 MG/ML IV SOLN
2.5000 mg | Freq: Once | INTRAVENOUS | Status: AC
  Administered 2015-03-18: 2.5 mg via INTRAVENOUS
  Filled 2015-03-18: qty 5

## 2015-03-18 MED ORDER — MUPIROCIN 2 % EX OINT
1.0000 "application " | TOPICAL_OINTMENT | Freq: Two times a day (BID) | CUTANEOUS | Status: DC
  Administered 2015-03-18 – 2015-03-20 (×4): 1 via NASAL
  Filled 2015-03-18: qty 22

## 2015-03-18 MED ORDER — NITROGLYCERIN 0.4 MG SL SUBL: 0.4000 mg | SUBLINGUAL_TABLET | SUBLINGUAL | Status: DC | PRN

## 2015-03-18 MED ORDER — HEPARIN (PORCINE) IN NACL 100-0.45 UNIT/ML-% IJ SOLN: 1000.0000 [IU]/h | INTRAMUSCULAR | Status: DC

## 2015-03-18 MED ORDER — HYDROCODONE-ACETAMINOPHEN 5-325 MG PO TABS: 1.0000 | ORAL_TABLET | Freq: Four times a day (QID) | ORAL | Status: DC | PRN

## 2015-03-18 MED ORDER — HEPARIN (PORCINE) IN NACL 2-0.9 UNIT/ML-% IJ SOLN
INTRAMUSCULAR | Status: AC
  Filled 2015-03-18: qty 1000

## 2015-03-18 MED ORDER — SODIUM CHLORIDE 0.9 % IJ SOLN: 3.0000 mL | INTRAMUSCULAR | Status: DC | PRN

## 2015-03-18 MED ORDER — MIDAZOLAM HCL 2 MG/2ML IJ SOLN
INTRAMUSCULAR | Status: DC | PRN
  Administered 2015-03-18: 1 mg via INTRAVENOUS

## 2015-03-18 MED ORDER — POTASSIUM CHLORIDE CRYS ER 20 MEQ PO TBCR
40.0000 meq | EXTENDED_RELEASE_TABLET | Freq: Once | ORAL | Status: AC
Start: 2015-03-18 — End: 2015-03-18
  Administered 2015-03-18: 40 meq via ORAL
  Filled 2015-03-18: qty 2

## 2015-03-18 MED ORDER — SODIUM CHLORIDE 0.9 % IJ SOLN: 3.0000 mL | Freq: Two times a day (BID) | INTRAMUSCULAR | Status: DC

## 2015-03-18 MED ORDER — ACETAMINOPHEN 325 MG PO TABS: 650.0000 mg | ORAL_TABLET | ORAL | Status: DC | PRN

## 2015-03-18 MED ORDER — GABAPENTIN 600 MG PO TABS
600.0000 mg | ORAL_TABLET | Freq: Three times a day (TID) | ORAL | Status: DC
  Administered 2015-03-18 – 2015-03-20 (×6): 600 mg via ORAL
  Filled 2015-03-18 (×6): qty 1

## 2015-03-18 MED ORDER — CHLORHEXIDINE GLUCONATE CLOTH 2 % EX PADS
6.0000 | MEDICATED_PAD | Freq: Every day | CUTANEOUS | Status: DC
  Administered 2015-03-18 – 2015-03-20 (×3): 6 via TOPICAL

## 2015-03-18 MED ORDER — INSULIN ASPART 100 UNIT/ML ~~LOC~~ SOLN
0.0000 [IU] | Freq: Every day | SUBCUTANEOUS | Status: DC
Start: 2015-03-18 — End: 2015-03-19
  Administered 2015-03-18: 5 [IU] via SUBCUTANEOUS

## 2015-03-18 MED ORDER — LIDOCAINE HCL (PF) 1 % IJ SOLN
INTRAMUSCULAR | Status: DC | PRN
  Administered 2015-03-18: 16:00:00

## 2015-03-18 MED ORDER — FENTANYL CITRATE (PF) 100 MCG/2ML IJ SOLN
INTRAMUSCULAR | Status: AC
  Filled 2015-03-18: qty 2

## 2015-03-18 MED ORDER — ATORVASTATIN CALCIUM 80 MG PO TABS
80.0000 mg | ORAL_TABLET | Freq: Every day | ORAL | Status: DC
  Administered 2015-03-19 – 2015-03-20 (×2): 80 mg via ORAL
  Filled 2015-03-18 (×3): qty 1

## 2015-03-18 MED ORDER — NITROGLYCERIN 1 MG/10 ML FOR IR/CATH LAB
INTRA_ARTERIAL | Status: DC | PRN
  Administered 2015-03-18: 200 ug via INTRACORONARY

## 2015-03-18 MED ORDER — POTASSIUM CHLORIDE CRYS ER 20 MEQ PO TBCR
40.0000 meq | EXTENDED_RELEASE_TABLET | Freq: Once | ORAL | Status: AC
  Administered 2015-03-18: 40 meq via ORAL
  Filled 2015-03-18: qty 2

## 2015-03-18 MED ORDER — INSULIN GLARGINE 100 UNIT/ML ~~LOC~~ SOLN
20.0000 [IU] | Freq: Every day | SUBCUTANEOUS | Status: DC
  Administered 2015-03-18: 20 [IU] via SUBCUTANEOUS
  Filled 2015-03-18 (×2): qty 0.2

## 2015-03-18 MED ORDER — OXYCODONE-ACETAMINOPHEN 5-325 MG PO TABS: 1.0000 | ORAL_TABLET | ORAL | Status: DC | PRN

## 2015-03-18 MED ORDER — ASPIRIN 81 MG PO CHEW
81.0000 mg | CHEWABLE_TABLET | Freq: Every day | ORAL | Status: DC
  Administered 2015-03-19 – 2015-03-20 (×2): 81 mg via ORAL
  Filled 2015-03-18 (×2): qty 1

## 2015-03-18 MED ORDER — MIDAZOLAM HCL 2 MG/2ML IJ SOLN
INTRAMUSCULAR | Status: AC
  Filled 2015-03-18: qty 2

## 2015-03-18 MED ORDER — SODIUM CHLORIDE 0.9 % IV SOLN
250.0000 mL | INTRAVENOUS | Status: DC | PRN
  Administered 2015-03-19: 250 mL via INTRAVENOUS

## 2015-03-18 MED ORDER — FUROSEMIDE 10 MG/ML IJ SOLN
40.0000 mg | Freq: Once | INTRAMUSCULAR | Status: AC
Start: 2015-03-18 — End: 2015-03-18
  Administered 2015-03-18: 40 mg via INTRAVENOUS
  Filled 2015-03-18: qty 4

## 2015-03-18 MED ORDER — HEPARIN (PORCINE) IN NACL 100-0.45 UNIT/ML-% IJ SOLN
1050.0000 [IU]/h | INTRAMUSCULAR | Status: DC
  Administered 2015-03-18: 1100 [IU]/h via INTRAVENOUS
  Filled 2015-03-18: qty 250

## 2015-03-18 MED ORDER — SODIUM CHLORIDE 0.9 % WEIGHT BASED INFUSION: 1.0000 mL/kg/h | INTRAVENOUS | Status: AC

## 2015-03-18 MED ORDER — ISOSORBIDE MONONITRATE ER 30 MG PO TB24
30.0000 mg | ORAL_TABLET | Freq: Every day | ORAL | Status: DC
  Administered 2015-03-18: 30 mg via ORAL
  Filled 2015-03-18 (×2): qty 1

## 2015-03-18 MED ORDER — ZOLPIDEM TARTRATE 5 MG PO TABS
5.0000 mg | ORAL_TABLET | Freq: Every evening | ORAL | Status: DC | PRN
  Administered 2015-03-19: 5 mg via ORAL
  Filled 2015-03-18 (×2): qty 1

## 2015-03-18 MED ORDER — SENNOSIDES-DOCUSATE SODIUM 8.6-50 MG PO TABS
1.0000 | ORAL_TABLET | Freq: Every day | ORAL | Status: DC
  Administered 2015-03-19 – 2015-03-20 (×2): 1 via ORAL
  Filled 2015-03-18 (×3): qty 1

## 2015-03-18 MED ORDER — SODIUM CHLORIDE 0.9 % IV SOLN
INTRAVENOUS | Status: DC
  Administered 2015-03-18: 06:00:00 via INTRAVENOUS

## 2015-03-18 MED ORDER — INSULIN ASPART 100 UNIT/ML ~~LOC~~ SOLN
0.0000 [IU] | Freq: Three times a day (TID) | SUBCUTANEOUS | Status: DC
Start: 2015-03-18 — End: 2015-03-19
  Administered 2015-03-18: 15 [IU] via SUBCUTANEOUS
  Administered 2015-03-18: 3 [IU] via SUBCUTANEOUS
  Administered 2015-03-19: 15 [IU] via SUBCUTANEOUS

## 2015-03-18 MED ORDER — LISINOPRIL 2.5 MG PO TABS
2.5000 mg | ORAL_TABLET | Freq: Every day | ORAL | Status: DC
  Administered 2015-03-18 – 2015-03-20 (×3): 2.5 mg via ORAL
  Filled 2015-03-18 (×4): qty 1

## 2015-03-18 MED ORDER — BENZONATATE 100 MG PO CAPS: 100.0000 mg | ORAL_CAPSULE | Freq: Two times a day (BID) | ORAL | Status: DC | PRN

## 2015-03-18 MED ORDER — INSULIN ASPART 100 UNIT/ML ~~LOC~~ SOLN
8.0000 [IU] | Freq: Once | SUBCUTANEOUS | Status: AC
  Administered 2015-03-18: 8 [IU] via SUBCUTANEOUS

## 2015-03-18 MED ORDER — SODIUM CHLORIDE 0.9 % IV SOLN
INTRAVENOUS | Status: DC
  Administered 2015-03-18: 150 mL/h via INTRAVENOUS

## 2015-03-18 MED ORDER — SODIUM CHLORIDE 0.9 % IJ SOLN
3.0000 mL | Freq: Two times a day (BID) | INTRAMUSCULAR | Status: DC
  Administered 2015-03-18 – 2015-03-20 (×3): 3 mL via INTRAVENOUS
  Filled 2015-03-18: qty 3

## 2015-03-18 MED ORDER — ACETAMINOPHEN 325 MG PO TABS
650.0000 mg | ORAL_TABLET | ORAL | Status: DC | PRN
  Administered 2015-03-19: 650 mg via ORAL
  Filled 2015-03-18: qty 2

## 2015-03-18 MED ORDER — NITROGLYCERIN 1 MG/10 ML FOR IR/CATH LAB
INTRA_ARTERIAL | Status: AC
  Filled 2015-03-18: qty 10

## 2015-03-18 MED ORDER — HEPARIN (PORCINE) IN NACL 2-0.9 UNIT/ML-% IJ SOLN
INTRAMUSCULAR | Status: DC | PRN
  Administered 2015-03-18: 10 mL via INTRA_ARTERIAL

## 2015-03-18 MED ORDER — ASPIRIN 81 MG PO CHEW: 324.0000 mg | CHEWABLE_TABLET | ORAL | Status: DC

## 2015-03-18 MED ORDER — SODIUM CHLORIDE 0.9 % IJ SOLN
3.0000 mL | Freq: Two times a day (BID) | INTRAMUSCULAR | Status: DC
  Administered 2015-03-20: 3 mL via INTRAVENOUS

## 2015-03-18 MED ORDER — FLUTICASONE PROPIONATE 50 MCG/ACT NA SUSP
2.0000 | Freq: Every day | NASAL | Status: DC
  Filled 2015-03-18: qty 16

## 2015-03-18 MED ORDER — ASPIRIN EC 81 MG PO TBEC: 81.0000 mg | DELAYED_RELEASE_TABLET | Freq: Every day | ORAL | Status: DC

## 2015-03-18 MED ORDER — ASPIRIN 81 MG PO TABS: 81.0000 mg | ORAL_TABLET | Freq: Every day | ORAL | Status: DC

## 2015-03-18 SURGICAL SUPPLY — 11 items
CATH INFINITI 5 FR JL3.5 (CATHETERS) ×2 IMPLANT
CATH INFINITI JR4 5F (CATHETERS) ×2 IMPLANT
DEVICE RAD COMP TR BAND LRG (VASCULAR PRODUCTS) ×2 IMPLANT
GLIDESHEATH SLEND A-KIT 6F 22G (SHEATH) ×2 IMPLANT
KIT ENCORE 26 ADVANTAGE (KITS) IMPLANT
KIT HEART LEFT (KITS) ×2 IMPLANT
PACK CARDIAC CATHETERIZATION (CUSTOM PROCEDURE TRAY) ×2 IMPLANT
STOPCOCK MORSE 400PSI 3WAY (MISCELLANEOUS) IMPLANT
TRANSDUCER W/STOPCOCK (MISCELLANEOUS) ×2 IMPLANT
TUBING CIL FLEX 10 FLL-RA (TUBING) ×2 IMPLANT
WIRE SAFE-T 1.5MM-J .035X260CM (WIRE) ×2 IMPLANT

## 2015-03-18 NOTE — Progress Notes (Signed)
ANTICOAGULATION CONSULT NOTE - Initial Consult  Pharmacy Consult for Heparin Indication: chest pain/ACS  No Known Allergies  Patient Measurements: Height: 5\' 6"  (167.6 cm) Weight: 183 lb (83.008 kg) IBW/kg (Calculated) : 63.8   Vital Signs: Temp: 99.6 F (37.6 C) (01/03 1547) Temp Source: Oral (01/03 1547) BP: 159/77 mmHg (01/04 0047) Pulse Rate: 92 (01/04 0030)  Labs:  Recent Labs  03/17/15 1930  HGB 12.4*  HCT 38.0*  PLT 218  CREATININE 1.46*  TROPONINI 3.99*    Estimated Creatinine Clearance: 53.7 mL/min (by C-G formula based on Cr of 1.46).   Medical History: Past Medical History  Diagnosis Date  . Diabetes type 1, uncontrolled ([facility])   . Frequent headaches   . Heart attack ([facility]) 09.13.2014    x5  . Hyperlipidemia   . Chicken pox   . Mumps   . Measles, [place] (rubella)   . Scarlet fever   . Diabetic retinopathy ([facility])   . Cataracts, bilateral   . Orthostatic hypotension   . CAD (coronary artery disease)   . Contrast dye induced nephropathy   . OSA (obstructive sleep apnea) 07/17/2013    Medications:   (Not in a hospital admission)  Assessment: [age] YOM who presented with exertional dyspnea with troponin of 3.99. Pharmacy consulted to start IV heparin for ACS. Cardiology suspects NSTEMI. H/H mildly low, Plt wnl. Patient is not on any anticoagulation prior to admission    Goal of Therapy:  Heparin level 0.3-0.7 units/ml Monitor platelets by anticoagulation protocol: Yes   Plan:  -Given heparin 4000 units IV bolus followed by IV heparin infusion at 1100 units/hr -F/u HL in AM   -Monitor CBC, daily HL, and s/s of bleeding   Vinnie LevelBenjamin Loyola Santino, PharmD., BCPS Clinical Pharmacist Pager [phone]

## 2015-03-18 NOTE — Progress Notes (Signed)
ANTICOAGULATION CONSULT NOTE  Pharmacy Consult for Heparin Indication: chest pain/ACS  No Known Allergies  Patient Measurements: Height: 5\' 6"  (167.6 cm) Weight: 178 lb 8 oz (80.967 kg) IBW/kg (Calculated) : 63.8   Vital Signs: Temp: 98.8 F (37.1 C) (01/04 1141) Temp Source: Oral (01/04 1141) BP: 177/89 mmHg (01/04 1609) Pulse Rate: 86 (01/04 1528)  Labs:  Recent Labs  03/17/15 1930 03/18/15 0055 03/18/15 0709 03/18/15 1030  HGB 12.4*  --   --  12.5*  HCT 38.0*  --   --  36.9*  PLT 218  --   --  200  LABPROT  --   --  15.8*  --   INR  --   --  1.24  --   HEPARINUNFRC  --   --   --  0.70  CREATININE 1.46*  --   --  1.58*  TROPONINI 3.99* 2.90*  --  1.27*    Estimated Creatinine Clearance: 49.1 mL/min (by C-G formula based on Cr of 1.58).  Assessment: [age] YOM who presented with exertional dyspnea with troponin of 3.99. Pharmacy consulted to start IV heparin for ACS.   Patient now s/p cath found to have diffuse multivessel CAD. No intervention was done and cardiology if recommending surgical consultation as no clear target for PCI. Orders received to restart heparin 8 hours post sheath pull tonight.  Goal of Therapy:  Heparin level 0.3-0.7 units/ml Monitor platelets by anticoagulation protocol: Yes   Plan:  -Restart heparin at 1000 units/hr>>start tonight at midnight --Monitor CBC, daily HL, and s/s of bleeding   Sheppard CoilFrank Stepanie Graver PharmD., BCPS Clinical Pharmacist Pager [phone] 03/18/2015 4:19 PM

## 2015-03-18 NOTE — H&P (Signed)
Physician History and Physical    SAQIB CAZAREZ MRN: [id number] DOB/AGE: [birth year] 60 y.o. Admit date: 03/17/2015  Primary Care Physician: Elyn Aquas Primary Cardiologist: Kirk Ruths  HPI: 60 yo with history of CAD and chronic orthostatic hypotension presented with exertional dyspnea and was noted to have TnI 3.99 in the ER.  He had his first MI in 2010 in [place].  He had subsequent coronary events in [place] prior to moving to [place] in 2014.  He had cath in [facility] in 9/14.  This showed 80% mRCA that was treated with DES.  He had residual severe LAD disease with up to 90% stenosis in the mid LAD.  The LAD was small in caliber and diffusely diseased.  It was not thought to be a good target for CABG or PCI.  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 He is taking midodrine and Florinef.  About 3 weeks ago, he developed head and chest congestion.  He noted increased dyspnea and would have to stop twice to catch his breath on his regular walk. He would also feel chest heaviness around the time he got short of breath.  Saw PCP, was given a course of Augmentin (now completed). However, over the last few days, symptoms worsened. Now short of breath and getting chest pressure with any exertion, comfortable at rest.  Went to ER tonight for evaluation.  CXR showed possible pulmonary edema and troponin was elevated at 3.99.  He was admitted.   PMH: 1. CAD: 1st MI in 2010, had other events subsequently.  Care was in [place] until moved to [place] in 2014.  PCI (9/14) at [facility] with DES to [facility].  He also had 90% mLAD, but LAD was diffusely diseased and small caliber => not good target for PCI or CABG.  Echo (10/14) with EF 60%, grade II diastolic dysfunction.   2. Contrast-induced nephropathy: Post-PCI in 9/14.  3. Orthostatic hypotension: ?Due to diabetic peripheral neuropathy.  4. Diabetes 5. OSA: CPAP  Review of systems complete and found to be  negative unless listed above   Family History  Problem Relation Age of Onset  . Vascular Disease Father 43    Deceased  . Transient ischemic attack Father   . Hypertension Mother 6    Deceased  . Liver cancer Paternal Grandfather   . Breast cancer Sister   . Heart disease Sister     Ablation for rhythm disturbance  . Healthy Brother     x1  . Other Mother     s/p PPM    Social History   Social History  . Marital Status: Married    Spouse Name: N/A  . Number of Children: 1  . Years of Education: N/A   Occupational History  . Not on file.   Social History Main Topics  . Smoking status: Never Smoker   . Smokeless tobacco: Not on file  . Alcohol Use: Yes     Comment: Rare  . Drug Use: No  . Sexual Activity: Not on file   Other Topics Concern  . 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  They have one grown daughter.   Highest level of education:  Master's Degree    No current facility-administered medications for this encounter.   Current Outpatient Prescriptions  Medication Sig Dispense Refill  . amoxicillin-clavulanate (AUGMENTIN) 875-125 MG tablet Take  1 tablet by mouth 2 (two) times daily. 14 tablet 0  . aspirin 81 MG tablet Take 81 mg by mouth daily.    Marland Kitchen atorvastatin (LIPITOR) 40 MG tablet Take 1 tablet (40 mg total) by mouth daily. 90 tablet 3  . benzonatate (TESSALON) 100 MG capsule Take 1 capsule (100 mg total) by mouth 2 (two) times daily as needed for cough. 20 capsule 0  . co-enzyme Q-10 30 MG capsule Take 30 mg by mouth daily.     . DULoxetine (CYMBALTA) 60 MG capsule TAKE 1 CAPSULE BY MOUTH DAILY. (Patient not taking: Reported on 03/02/2015) 30 capsule 3  . fludrocortisone (FLORINEF) 0.1 MG tablet TAKE 1 TABLET BY MOUTH TWICE DAILY 60 tablet 2  . fluticasone (FLONASE) 50 MCG/ACT nasal spray Place 2 sprays into both nostrils daily. 16 g 6  . gabapentin (NEURONTIN) 600 MG tablet TAKE 1  TABLET BY MOUTH 3 TIMES DAILY. 90 tablet 2  . glucagon 1 MG injection Inject 1 mg into the vein once as needed (Emergency Test Kit).    Marland Kitchen glucose blood test strip 1 each by Other route as directed. Use as instructed FREESTYLE IN VITRO    . HYDROcodone-acetaminophen (NORCO/VICODIN) 5-325 MG tablet Take 1 tablet by mouth every 6 (six) hours as needed for severe pain. 60 tablet 0  . insulin lispro (HUMALOG) 100 UNIT/ML injection Inject into the skin as directed. Use in Insulin Pump, Max daily dose: 100 units/day    . isosorbide mononitrate (IMDUR) 30 MG 24 hr tablet TAKE 1 TABLET (30 MG TOTAL) BY MOUTH DAILY. 30 tablet 5  . lisinopril (PRINIVIL,ZESTRIL) 10 MG tablet Take 10 mg by mouth daily.    . meclizine (ANTIVERT) 12.5 MG tablet Take 1 tablet (12.5 mg total) by mouth 3 (three) times daily as needed for dizziness. 30 tablet 0  . metoCLOPramide (REGLAN) 10 MG tablet Take 1 tablet (10 mg total) by mouth every 8 (eight) hours as needed for nausea. 30 tablet 0  . midodrine (PROAMATINE) 10 MG tablet TAKE 1 TABLET BY MOUTH TWICE DAILY 60 tablet 2  . SENEXON-S 8.6-50 MG tablet TAKE 1 TABLET BY MOUTH DAILY. 100 tablet 1  . Vitamin D, Ergocalciferol, (DRISDOL) 50000 UNITS CAPS capsule Take 1 capsule (50,000 Units total) by mouth every 7 (seven) days. 10 capsule 0  . zolpidem (AMBIEN CR) 12.5 MG CR tablet Take 1 tablet (12.5 mg total) by mouth at bedtime. 30 tablet 0     Physical Exam: Blood pressure 160/83, pulse 89, temperature 99.6 F (37.6 C), temperature source Oral, resp. rate 25, height _0  (1.676 m), weight 183 lb (83.008 kg), SpO2 98 %.  General: NAD Neck: No JVP 8-9 cm, no thyromegaly or thyroid nodule.  Lungs: Slight crackles at bases bilaterally. CV: Nondisplaced PMI.  Heart regular S1/S2, no S3/S4, no murmur.  No peripheral edema.  No carotid bruit.  Normal pedal pulses.  Abdomen: Soft, nontender, no hepatosplenomegaly, no distention.  Skin: Intact without lesions or rashes.    Neurologic: Alert and oriented x 3.  Psych: Normal affect. Extremities: No clubbing or cyanosis.  HEENT: Normal.   Labs:   Lab Results  Component Value Date   WBC 8.3 03/17/2015   HGB 12.4* 03/17/2015   HCT 38.0* 03/17/2015   MCV 85.8 03/17/2015   PLT 218 03/17/2015    Recent Labs Lab 03/17/15 1930  NA 138  K 2.9*  CL 101  CO2 28  BUN 15  CREATININE 1.46*  CALCIUM 8.3*  PROT 7.0  BILITOT 1.0  ALKPHOS 110  ALT 17  AST 30  GLUCOSE 184*   Lab Results  Component Value Date   TROPONINI 3.99* 03/17/2015  TnI 3.99 => 2.09  BNP 451  Radiology: - CXR: possible pulmonary edema  EKG: NSR, left axis deviation, poor RWP, LVH  ASSESSMENT AND PLAN: 60 yo with history of CAD and chronic orthostatic hypotension presented with exertional dyspnea and was noted to have TnI 3.99 in the ER, suspect NSTEMI as well as acute CHF.  1. NSTEMI: Troponin initially 3.99, repeat 2.09.  He has history of CAD.  He has been having exertional chest pressure that has steadily worsened over three weeks.  Today, chest pressure has occurred with minimal exertion.  Currently pain-free.  Given down-trending TnI, his event may be subacute.  Of note, last cath in 2014 resulted in RCA PCI.  The LAD was noted to be a small, diffusely diseased vessel with poor targets for either PCI or CABG.  - Heparin gtt, ASA, increase atorvastatin to 80 mg daily.  - He will need cardiac cath today.  He has history of contrast-induced nephropathy, creatinine 1.46.  Will not hydrate given CHF.  Will need to limit contrast.  - Continue lisinopril at lower dose, 2.5 mg daily.  2. Acute CHF: Last echo in 2014 with EF 60-65%.  He is volume overloaded on exam with dyspnea and pulmonary edema on CXR.   - Lasix 40 mg IV x 1, will likely need further diuresis after cath.  K is being repleted.  - Echo to assess LV EF.  3. Orthostatic hypotension: Apparently this has been a significant problem for him, he has been on both Florinef and  midodrine.  With volume overload, will stop Florinef.  Continue midodrine for now.  4. CKD: Creatinine 1.46.  As above, need to be aware of prior contrast-induced nephropathy (required HD).   Loralie Champagne 03/18/2015

## 2015-03-18 NOTE — Progress Notes (Signed)
Inpatient Diabetes Program Recommendations  AACE/ADA: New Consensus Statement on Inpatient Glycemic Control (2015)  Target Ranges:  Prepandial:   less than 140 mg/dL      Peak postprandial:   less than 180 mg/dL (1-2 hours)      Critically ill patients:  140 - 180 mg/dL   Results for Timothy Alvarez, Timothy Alvarez (MRN [id number]) as of 03/18/2015 11:00  Ref. Range 03/17/2015 17:15 03/18/2015 01:41 03/18/2015 08:15  Glucose-Capillary Latest Ref Range: 65-99 mg/dL 284181 (H) 132382 (H) 440440 (H)     Admit with: NSTEMI  History: Type 1 DM  Home DM Meds: Insulin Pump  Current Insulin Orders: Novolog Moderate SSI (0-15 units) TID AC + HS     -Spoke with patient.  Uses Insulin Pump at home.  Per patient, he removed insulin pump in the ED and sent pump home with his family.  Could not verbalize exactly how much insulin he gets with his insulin pump.  Told me he sees [name], [place] with [facility].    -Concern for patient this AM.  AM CBG was 440 mg/dl.  Patient was given 15 units Novolog for this CBG per SSI and Dr. Antoine PocheHochrein was notified.  -Spoke with patient about starting basal insulin like Lantus for him while he is off his insulin pump.  Patient agreeable.  Reminded patient that if we give him Lantus insulin here in the hospital, patient will need to wait 24 hours to restart his insulin pump basal rates after last dose Lantus given so that the Lantus insulin and insulin pump basal rates do not overlap and cause Hypoglycemia.  -Called [facility] and spoke with Jessica PriestWanetta, Certified Diabetes Educator and Pump trainer for [facility].  Was given all of patient's pump settings.  See below for pump settings.  -Based on patient's insulin pump settings, patient will need Lantus 20 units daily while off insulin pump to cover his basal insulin needs.  Called Dr. Antoine PocheHochrein and spoke with Roby Loftshris Berghe who was answering for Dr. Antoine PocheHochrein and reviewed this information.  Got orders to start  Lantus 20 units daily- give 1st dose STAT.  Spoke with RN and alerted her to the new orders.  Asked RN to please review STAT BMET results as soon as they result to check patient's CO2 and Anion Gap to make sure patient is not going into DKA this morning.   Patient's insulin pump settings per Jessica PriestWanetta, [facility]:  Basal Rates- Midnight- 0.9 units/hr 11AM- 0.8 units/hr 5PM- 0.9 units/hr  Patient gets total of 21 units basal insulin per 24 hour period on insulin pump  Carbohydrate Ratio: MN- 1 unit for every 9 grams carbohydrates 42M- 1 unit for every 8 grams carbohydrates  Correction/Sensitivity Factor: 1 unit for every 40 mg/dl above target CBG  Target CBG: 90-120 mg/dl      --Will follow patient during hospitalization--  Ambrose FinlandJeannine Johnston Burma Ketcher RN, MSN, CDE Diabetes Coordinator Inpatient Glycemic Control Team Team Pager: [phone] (8a-5p)

## 2015-03-18 NOTE — Progress Notes (Signed)
Inpatient Diabetes Program Recommendations  AACE/ADA: New Consensus Statement on Inpatient Glycemic Control (2015)  Target Ranges:  Prepandial:   less than 140 mg/dL      Peak postprandial:   less than 180 mg/dL (1-2 hours)      Critically ill patients:  140 - 180 mg/dL   Call received from RN.  Patient's blood sugar is now 405 mg/dL.  Anion Gap=13 and CO2=25. Discussed insulin pump settings with RN.  Patient's correction factor is 40 mg/dL  Therefore consider Novolog 8 units for blood sugar of 405 mg/dL, since 1 unit of insulin drops blood glucose about 40 mg/dL.  RN states she will call MD regarding recommendation.  Will follow.  Thanks, Beryl MeagerJenny Satomi Buda, RN, BC-ADM Inpatient Diabetes Coordinator Pager [phone] (8a-5p)

## 2015-03-18 NOTE — Care Management Note (Signed)
Case Management Note  Patient Details  Name: Timothy Alvarez MRN: [id number] Date of Birth: Dec 04, 1955  Subjective/Objective:   Pt admitted for Chest Pain-Nstemi. Plan for cardiac cath 03-18-15.                 Action/Plan: CM will continue to monitor for disposition needs.    Expected Discharge Date:                  Expected Discharge Plan:  Home/Self Care  In-House Referral:  NA  Discharge planning Services  CM Consult  Post Acute Care Choice:    Choice offered to:     DME Arranged:    DME Agency:     HH Arranged:    HH Agency:     Status of Service:  In process, will continue to follow  Medicare Important Message Given:    Date Medicare IM Given:    Medicare IM give by:    Date Additional Medicare IM Given:    Additional Medicare Important Message give by:     If discussed at Long Length of Stay Meetings, dates discussed:    Additional Comments:  Gala LewandowskyGraves-Bigelow, Konya Fauble Kaye, RN 03/18/2015, 1:46 PM

## 2015-03-18 NOTE — Progress Notes (Signed)
ANTICOAGULATION CONSULT NOTE - Initial Consult  Pharmacy Consult for Heparin Indication: chest pain/ACS  No Known Allergies  Patient Measurements: Height: _0  (167.6 cm) Weight: 178 lb 8 oz (80.967 kg) IBW/kg (Calculated) : 63.8   Vital Signs: Temp: 98.4 F (36.9 C) (01/04 0822) Temp Source: Axillary (01/04 0822) BP: 152/77 mmHg (01/04 0822) Pulse Rate: 84 (01/04 0822)  Labs:  Recent Labs  03/17/15 1930 03/18/15 0055 03/18/15 0709 03/18/15 1030  HGB 12.4*  --   --  12.5*  HCT 38.0*  --   --  36.9*  PLT 218  --   --  200  LABPROT  --   --  15.8*  --   INR  --   --  1.24  --   HEPARINUNFRC  --   --   --  0.70  CREATININE 1.46*  --   --   --   TROPONINI 3.99* 2.90*  --   --     Estimated Creatinine Clearance: 53.1 mL/min (by C-G formula based on Cr of 1.46).   Medical History: Past Medical History  Diagnosis Date  . Diabetes type 1, uncontrolled ([place])   . Frequent headaches   . Heart attack ([place]) 09.13.2014    x5  . Hyperlipidemia   . Chicken pox   . Mumps   . Measles, [place] (rubella)   . Scarlet fever   . Diabetic retinopathy ([place])   . Cataracts, bilateral   . Orthostatic hypotension   . CAD (coronary artery disease)   . Contrast dye induced nephropathy   . OSA (obstructive sleep apnea) 07/17/2013    Medications:  Prescriptions prior to admission  Medication Sig Dispense Refill Last Dose  . acetaminophen (TYLENOL) 325 MG tablet Take 650 mg by mouth every 6 (six) hours as needed for mild pain.   Past Week at Unknown time  . aspirin 81 MG tablet Take 81 mg by mouth daily.   03/17/2015 at Unknown time  . atorvastatin (LIPITOR) 40 MG tablet Take 1 tablet (40 mg total) by mouth daily. 90 tablet 3 03/17/2015 at Unknown time  . benzonatate (TESSALON) 100 MG capsule Take 1 capsule (100 mg total) by mouth 2 (two) times daily as needed for cough. 20 capsule 0 Past Week at Unknown time  . co-enzyme Q-10 30 MG capsule Take 30 mg by mouth daily.    03/17/2015 at  Unknown time  . fludrocortisone (FLORINEF) 0.1 MG tablet TAKE 1 TABLET BY MOUTH TWICE DAILY 60 tablet 2 03/17/2015 at Unknown time  . fluticasone (FLONASE) 50 MCG/ACT nasal spray Place 2 sprays into both nostrils daily. 16 g 6 Past Week at Unknown time  . gabapentin (NEURONTIN) 600 MG tablet TAKE 1 TABLET BY MOUTH 3 TIMES DAILY. (Patient taking differently: TAKE 1 TABLET BY MOUTH TWICE DAILY) 90 tablet 2 03/17/2015 at Unknown time  . glucagon 1 MG injection Inject 1 mg into the vein once as needed (Emergency Test Kit).   Past Week at Unknown time  . glucose blood test strip 1 each by Other route as directed. Use as instructed FREESTYLE IN VITRO   Past Week at Unknown time  . HYDROcodone-acetaminophen (NORCO/VICODIN) 5-325 MG tablet Take 1 tablet by mouth every 6 (six) hours as needed for severe pain. 60 tablet 0 Past Week at Unknown time  . insulin lispro (HUMALOG) 100 UNIT/ML injection Inject 1-40 Units into the skin as directed. Use in Insulin Pump, Max daily dose: 100 units/day   Past Week at Unknown time  .  isosorbide mononitrate (IMDUR) 30 MG 24 hr tablet TAKE 1 TABLET (30 MG TOTAL) BY MOUTH DAILY. 30 tablet 5 03/17/2015 at Unknown time  . lisinopril (PRINIVIL,ZESTRIL) 10 MG tablet Take 10 mg by mouth daily.   03/17/2015 at Unknown time  . meclizine (ANTIVERT) 12.5 MG tablet Take 1 tablet (12.5 mg total) by mouth 3 (three) times daily as needed for dizziness. 30 tablet 0 Past Month at Unknown time  . metoCLOPramide (REGLAN) 10 MG tablet Take 1 tablet (10 mg total) by mouth every 8 (eight) hours as needed for nausea. 30 tablet 0 Past Week at Unknown time  . midodrine (PROAMATINE) 10 MG tablet TAKE 1 TABLET BY MOUTH TWICE DAILY 60 tablet 2 03/17/2015 at Unknown time  . Pseudoephedrine-APAP-DM (DAYQUIL MULTI-SYMPTOM COLD/FLU PO) Take 1 capsule by mouth every 6 (six) hours as needed (cold/flu symptoms).   Past Week at Unknown time  . SENEXON-S 8.6-50 MG tablet TAKE 1 TABLET BY MOUTH DAILY. 100 tablet 1 03/17/2015  at Unknown time  . zolpidem (AMBIEN CR) 12.5 MG CR tablet Take 1 tablet (12.5 mg total) by mouth at bedtime. 30 tablet 0 Past Week at Unknown time    Assessment: 55 YOM who presented with exertional dyspnea with troponin of 3.99. Pharmacy consulted to start IV heparin for ACS. Cardiology suspects NSTEMI. H/H mildly low, Plt wnl. Patient is not on any anticoagulation prior to admission  Plan for cath today 1430  Goal of Therapy:  Heparin level 0.3-0.7 units/ml Monitor platelets by anticoagulation protocol: Yes   Plan:  -Decrease heparin to 1050 units/hr -HL 0.7 on 1100 units/hr. Plan for cath at 1430 -F/u post cath  -Monitor CBC, daily HL, and s/s of bleeding   Jens Som, PharmD. Clinical Pharmacist

## 2015-03-18 NOTE — Progress Notes (Signed)
SUBJECTIVE:   Currently no chest pain or SOB   PHYSICAL EXAM Filed Vitals:   03/18/15 0254 03/18/15 0514 03/18/15 0822 03/18/15 1141  BP:   152/77   Pulse: 99  84   Temp:  98.8 F (37.1 C) 98.4 F (36.9 C) 98.8 F (37.1 C)  TempSrc:  Oral Axillary Oral  Resp: 18  14   Height:      Weight:  178 lb 8 oz (80.967 kg)    SpO2: 100%  96%    General:  No distress Lungs:  Clear Heart:  RRR Abdomen:  Positive bowel sounds, no rebound no guarding Extremities:  No edema  LABS: Lab Results  Component Value Date   TROPONINI 1.27* 03/18/2015   Results for orders placed or performed during the hospital encounter of 03/17/15 (from the past 24 hour(s))  POC CBG, ED     Status: Abnormal   Collection Time: 03/17/15  5:15 PM  Result Value Ref Range   Glucose-Capillary 181 (H) 65 - 99 mg/dL  Comprehensive metabolic panel     Status: Abnormal   Collection Time: 03/17/15  7:30 PM  Result Value Ref Range   Sodium 138 135 - 145 mmol/L   Potassium 2.9 (L) 3.5 - 5.1 mmol/L   Chloride 101 101 - 111 mmol/L   CO2 28 22 - 32 mmol/L   Glucose, Bld 184 (H) 65 - 99 mg/dL   BUN 15 6 - 20 mg/dL   Creatinine, Ser 4.091.46 (H) 0.61 - 1.24 mg/dL   Calcium 8.3 (L) 8.9 - 10.3 mg/dL   Total Protein 7.0 6.5 - 8.1 g/dL   Albumin 3.7 3.5 - 5.0 g/dL   AST 30 15 - 41 U/L   ALT 17 17 - 63 U/L   Alkaline Phosphatase 110 38 - 126 U/L   Total Bilirubin 1.0 0.3 - 1.2 mg/dL   GFR calc non Af Amer 50 (L) >60 mL/min   GFR calc Af Amer 58 (L) >60 mL/min   Anion gap 9 5 - 15  Lipase, blood     Status: None   Collection Time: 03/17/15  7:30 PM  Result Value Ref Range   Lipase 14 11 - 51 U/L  Troponin I     Status: Abnormal   Collection Time: 03/17/15  7:30 PM  Result Value Ref Range   Troponin I 3.99 (HH) <0.031 ng/mL  CBC with Differential     Status: Abnormal   Collection Time: 03/17/15  7:30 PM  Result Value Ref Range   WBC 8.3 4.0 - 10.5 K/uL   RBC 4.43 4.22 - 5.81 MIL/uL   Hemoglobin 12.4 (L) 13.0 -  17.0 g/dL   HCT 81.138.0 (L) 91.439.0 - 78.252.0 %   MCV 85.8 78.0 - 100.0 fL   MCH 28.0 26.0 - 34.0 pg   MCHC 32.6 30.0 - 36.0 g/dL   RDW 95.612.6 21.311.5 - 08.615.5 %   Platelets 218 150 - 400 K/uL   Neutrophils Relative % 77 %   Neutro Abs 6.3 1.7 - 7.7 K/uL   Lymphocytes Relative 13 %   Lymphs Abs 1.1 0.7 - 4.0 K/uL   Monocytes Relative 10 %   Monocytes Absolute 0.8 0.1 - 1.0 K/uL   Eosinophils Relative 0 %   Eosinophils Absolute 0.0 0.0 - 0.7 K/uL   Basophils Relative 0 %   Basophils Absolute 0.0 0.0 - 0.1 K/uL  Brain natriuretic peptide     Status: Abnormal   Collection Time: 03/17/15  7:30 PM  Result Value Ref Range   B Natriuretic Peptide 451.1 (H) 0.0 - 100.0 pg/mL  Troponin I     Status: Abnormal   Collection Time: 03/18/15 12:55 AM  Result Value Ref Range   Troponin I 2.90 (HH) <0.031 ng/mL  Brain natriuretic peptide     Status: Abnormal   Collection Time: 03/18/15 12:55 AM  Result Value Ref Range   B Natriuretic Peptide 411.7 (H) 0.0 - 100.0 pg/mL  Glucose, capillary     Status: Abnormal   Collection Time: 03/18/15  1:41 AM  Result Value Ref Range   Glucose-Capillary 382 (H) 65 - 99 mg/dL  MRSA PCR Screening     Status: Abnormal   Collection Time: 03/18/15  1:43 AM  Result Value Ref Range   MRSA by PCR POSITIVE (A) NEGATIVE  Urinalysis, Routine w reflex microscopic     Status: Abnormal   Collection Time: 03/18/15  2:00 AM  Result Value Ref Range   Color, Urine YELLOW YELLOW   APPearance CLEAR CLEAR   Specific Gravity, Urine 1.014 1.005 - 1.030   pH 6.0 5.0 - 8.0   Glucose, UA 500 (A) NEGATIVE mg/dL   Hgb urine dipstick TRACE (A) NEGATIVE   Bilirubin Urine NEGATIVE NEGATIVE   Ketones, ur 15 (A) NEGATIVE mg/dL   Protein, ur 161 (A) NEGATIVE mg/dL   Nitrite NEGATIVE NEGATIVE   Leukocytes, UA NEGATIVE NEGATIVE  Urine microscopic-add on     Status: Abnormal   Collection Time: 03/18/15  2:00 AM  Result Value Ref Range   Squamous Epithelial / LPF 0-5 (A) NONE SEEN   WBC, UA 0-5 0  - 5 WBC/hpf   RBC / HPF 0-5 0 - 5 RBC/hpf   Bacteria, UA RARE (A) NONE SEEN   Casts HYALINE CASTS (A) NEGATIVE  Influenza panel by PCR (type A & B, H1N1)     Status: None   Collection Time: 03/18/15  5:25 AM  Result Value Ref Range   Influenza A By PCR NEGATIVE NEGATIVE   Influenza B By PCR NEGATIVE NEGATIVE   H1N1 flu by pcr NOT DETECTED NOT DETECTED  Protime-INR     Status: Abnormal   Collection Time: 03/18/15  7:09 AM  Result Value Ref Range   Prothrombin Time 15.8 (H) 11.6 - 15.2 seconds   INR 1.24 0.00 - 1.49  Glucose, capillary     Status: Abnormal   Collection Time: 03/18/15  8:15 AM  Result Value Ref Range   Glucose-Capillary 440 (H) 65 - 99 mg/dL  Basic metabolic panel     Status: Abnormal   Collection Time: 03/18/15 10:30 AM  Result Value Ref Range   Sodium 137 135 - 145 mmol/L   Potassium 3.1 (L) 3.5 - 5.1 mmol/L   Chloride 99 (L) 101 - 111 mmol/L   CO2 25 22 - 32 mmol/L   Glucose, Bld 418 (H) 65 - 99 mg/dL   BUN 17 6 - 20 mg/dL   Creatinine, Ser 0.96 (H) 0.61 - 1.24 mg/dL   Calcium 8.4 (L) 8.9 - 10.3 mg/dL   GFR calc non Af Amer 46 (L) >60 mL/min   GFR calc Af Amer 53 (L) >60 mL/min   Anion gap 13 5 - 15  CBC     Status: Abnormal   Collection Time: 03/18/15 10:30 AM  Result Value Ref Range   WBC 7.9 4.0 - 10.5 K/uL   RBC 4.36 4.22 - 5.81 MIL/uL   Hemoglobin 12.5 (L) 13.0 -  17.0 g/dL   HCT 16.1 (L) 09.6 - 04.5 %   MCV 84.6 78.0 - 100.0 fL   MCH 28.7 26.0 - 34.0 pg   MCHC 33.9 30.0 - 36.0 g/dL   RDW 40.9 81.1 - 91.4 %   Platelets 200 150 - 400 K/uL  Heparin level (unfractionated)     Status: None   Collection Time: 03/18/15 10:30 AM  Result Value Ref Range   Heparin Unfractionated 0.70 0.30 - 0.70 IU/mL  Lipid panel     Status: Abnormal   Collection Time: 03/18/15 10:30 AM  Result Value Ref Range   Cholesterol 113 0 - 200 mg/dL   Triglycerides 74 <782 mg/dL   HDL 32 (L) >95 mg/dL   Total CHOL/HDL Ratio 3.5 RATIO   VLDL 15 0 - 40 mg/dL   LDL  Cholesterol 66 0 - 99 mg/dL  Troponin I     Status: Abnormal   Collection Time: 03/18/15 10:30 AM  Result Value Ref Range   Troponin I 1.27 (HH) <0.031 ng/mL  Glucose, capillary     Status: Abnormal   Collection Time: 03/18/15 11:40 AM  Result Value Ref Range   Glucose-Capillary 406 (H) 65 - 99 mg/dL   Comment 1 Notify RN    Comment 2 Document in Chart     Intake/Output Summary (Last 24 hours) at 03/18/15 1254 Last data filed at 03/18/15 1008  Gross per 24 hour  Intake    240 ml  Output   1950 ml  Net  -1710 ml    ASSESSMENT AND PLAN:  NSTEMI:  Cath today.  See Dr. Gayland Curry note from earlier today.   CKD:  Limit dye.  Patient understands the risk.  HYPOKALEMIA:   He has been supplemented with 80 meq total of potassium since admission.  Check BMET in the AM.    Rollene Rotunda 03/18/2015 12:54 PM

## 2015-03-18 NOTE — Progress Notes (Signed)
Set pt up on Cpap.  Settings are 8.0 cmh20 with 2 lpm bled in.  Pt resting well no issues to report.

## 2015-03-18 NOTE — Interval H&P Note (Signed)
Cath Lab Visit (complete for each Cath Lab visit)  Clinical Evaluation Leading to the Procedure:   ACS: Yes.    Non-ACS:    Anginal Classification: CCS Alvarez  Anti-ischemic medical therapy: Maximal Therapy (2 or more classes of medications)  Non-Invasive Test Results: No non-invasive testing performed  Prior CABG: No previous CABG      History and Physical Interval Note:  03/18/2015 11:52 AM  Timothy Alvarez  has presented today for surgery, with the diagnosis of NSTEMI  The various methods of treatment have been discussed with the patient and family. After consideration of risks, benefits and other options for treatment, the patient has consented to  Procedure(s): Left Heart Cath and Coronary Angiography (N/A) as a surgical intervention .  The patient's history has been reviewed, patient examined, no change in status, stable for surgery.  I have reviewed the patient's chart and labs.  Questions were answered to the patient's satisfaction.     Timothy Alvarez,Timothy Alvarez

## 2015-03-18 NOTE — Progress Notes (Signed)
UR Completed Janeliz Prestwood [name], RN,BSN [phone]  

## 2015-03-19 ENCOUNTER — Encounter (HOSPITAL_COMMUNITY): Payer: Self-pay | Admitting: Interventional Cardiology

## 2015-03-19 DIAGNOSIS — R69 Illness, unspecified: Secondary | ICD-10-CM

## 2015-03-19 LAB — CBC
HEMATOCRIT: 35 % — AB (ref 39.0–52.0)
Hemoglobin: 11.8 g/dL — ABNORMAL LOW (ref 13.0–17.0)
MCH: 29 pg (ref 26.0–34.0)
MCHC: 33.7 g/dL (ref 30.0–36.0)
MCV: 86 fL (ref 78.0–100.0)
Platelets: 206 10*3/uL (ref 150–400)
RBC: 4.07 MIL/uL — ABNORMAL LOW (ref 4.22–5.81)
RDW: 13.1 % (ref 11.5–15.5)
WBC: 7.5 10*3/uL (ref 4.0–10.5)

## 2015-03-19 LAB — BASIC METABOLIC PANEL
ANION GAP: 12 (ref 5–15)
BUN: 18 mg/dL (ref 6–20)
CO2: 24 mmol/L (ref 22–32)
Calcium: 8.3 mg/dL — ABNORMAL LOW (ref 8.9–10.3)
Chloride: 100 mmol/L — ABNORMAL LOW (ref 101–111)
Creatinine, Ser: 1.24 mg/dL (ref 0.61–1.24)
GFR calc Af Amer: >60 mL/min (ref >60)
GFR calc non Af Amer: >60 mL/min (ref >60)
GLUCOSE: 301 mg/dL — AB (ref 65–99)
POTASSIUM: 3.8 mmol/L (ref 3.5–5.1)
Sodium: 136 mmol/L (ref 135–145)

## 2015-03-19 LAB — GLUCOSE, CAPILLARY
Glucose-Capillary: 416 mg/dL — ABNORMAL HIGH (ref 65–99)
Glucose-Capillary: 249 mg/dL — ABNORMAL HIGH (ref 65–99)
Glucose-Capillary: 175 mg/dL — ABNORMAL HIGH (ref 65–99)
Glucose-Capillary: 134 mg/dL — ABNORMAL HIGH (ref 65–99)

## 2015-03-19 LAB — BRAIN NATRIURETIC PEPTIDE: B Natriuretic Peptide: 377.5 pg/mL — ABNORMAL HIGH (ref 0.0–100.0)

## 2015-03-19 LAB — HEPARIN LEVEL (UNFRACTIONATED): Heparin Unfractionated: 0.25 IU/mL — ABNORMAL LOW (ref 0.30–0.70)

## 2015-03-19 MED ORDER — FUROSEMIDE 20 MG PO TABS
10.0000 mg | ORAL_TABLET | Freq: Once | ORAL | Status: AC
  Administered 2015-03-19: 10 mg via ORAL
  Filled 2015-03-19: qty 1

## 2015-03-19 MED ORDER — INSULIN GLARGINE 100 UNIT/ML ~~LOC~~ SOLN
24.0000 [IU] | Freq: Every day | SUBCUTANEOUS | Status: DC
  Administered 2015-03-19 – 2015-03-20 (×2): 24 [IU] via SUBCUTANEOUS
  Filled 2015-03-19 (×2): qty 0.24

## 2015-03-19 MED ORDER — INSULIN ASPART 100 UNIT/ML ~~LOC~~ SOLN
0.0000 [IU] | SUBCUTANEOUS | Status: DC
  Administered 2015-03-19: 2 [IU] via SUBCUTANEOUS
  Administered 2015-03-19: 3 [IU] via SUBCUTANEOUS
  Administered 2015-03-19: 1 [IU] via SUBCUTANEOUS
  Administered 2015-03-20 (×2): 3 [IU] via SUBCUTANEOUS
  Administered 2015-03-20: 1 [IU] via SUBCUTANEOUS

## 2015-03-19 MED ORDER — AZITHROMYCIN 250 MG PO TABS
500.0000 mg | ORAL_TABLET | Freq: Every day | ORAL | Status: AC
  Administered 2015-03-19: 500 mg via ORAL
  Filled 2015-03-19: qty 2

## 2015-03-19 MED ORDER — ISOSORBIDE MONONITRATE ER 60 MG PO TB24
60.0000 mg | ORAL_TABLET | Freq: Every day | ORAL | Status: DC
  Administered 2015-03-19 – 2015-03-20 (×2): 60 mg via ORAL
  Filled 2015-03-19 (×2): qty 1

## 2015-03-19 MED ORDER — FUROSEMIDE 20 MG PO TABS
10.0000 mg | ORAL_TABLET | Freq: Once | ORAL | Status: AC
  Filled 2015-03-19: qty 1

## 2015-03-19 MED ORDER — AZITHROMYCIN 250 MG PO TABS
250.0000 mg | ORAL_TABLET | Freq: Every day | ORAL | Status: DC
Start: 2015-03-20 — End: 2015-03-20
  Administered 2015-03-20: 250 mg via ORAL
  Filled 2015-03-19: qty 1

## 2015-03-19 MED ORDER — INSULIN ASPART 100 UNIT/ML ~~LOC~~ SOLN
4.0000 [IU] | Freq: Three times a day (TID) | SUBCUTANEOUS | Status: DC
  Administered 2015-03-19 – 2015-03-20 (×4): 4 [IU] via SUBCUTANEOUS

## 2015-03-19 NOTE — Progress Notes (Signed)
ANTICOAGULATION CONSULT NOTE - Follow Up Consult  Pharmacy Consult for Heparin  Indication: Multi-vessel CAD, s/p cath  No Known Allergies  Patient Measurements: Height: 5\' 6"  (167.6 cm) Weight: 180 lb 1.6 oz (81.693 kg) IBW/kg (Calculated) : 63.8  Vital Signs: Temp: 99 F (37.2 C) (01/05 0351) Temp Source: Oral (01/05 0351) BP: 157/80 mmHg (01/05 0351) Pulse Rate: 82 (01/05 0351)  Labs:  Recent Labs  03/17/15 1930 03/18/15 0055 03/18/15 0709 03/18/15 1030 03/19/15 0552  HGB 12.4*  --   --  12.5*  --   HCT 38.0*  --   --  36.9*  --   PLT 218  --   --  200  --   LABPROT  --   --  15.8*  --   --   INR  --   --  1.24  --   --   HEPARINUNFRC  --   --   --  0.70 0.25*  CREATININE 1.46*  --   --  1.58*  --   TROPONINI 3.99* 2.90*  --  1.27*  --     Estimated Creatinine Clearance: 49.3 mL/min (by C-G formula based on Cr of 1.58).  Assessment: Heparin level is sub-therapeutic after re-start s/p cath  Goal of Therapy:  Heparin level 0.3-0.7 units/ml Monitor platelets by anticoagulation protocol: Yes   Plan -Increase heparin drip to 1100 units/hr -1400 HL  Timothy Alvarez 03/19/2015,6:29 AM

## 2015-03-19 NOTE — Progress Notes (Signed)
Patient with 101.1 fever. Cardiology PA notified. No new orders received. Patient comfortable, no distress or pain stated or observed. Fleet Contrasachel, RN to assume care of this patient and she is aware of temp. Alwyn Ren- Rhen Dossantos, RN

## 2015-03-19 NOTE — Progress Notes (Signed)
Found pt as MI and wrote order for Cardiac Rehab Phase I. Came to ambulate however pt sts he is exhausted and is planning on sleeping for a few hours. Refused to try to walk. RN sts he has refused to move all day. Will f/u tomorrow. Ethelda ChickKristan Virtie Bungert CES, ACSM 2:03 PM 03/19/2015

## 2015-03-19 NOTE — Progress Notes (Signed)
SUBJECTIVE:   He is SOB today.  He did not sleep well last night.  Cough.     PHYSICAL EXAM Filed Vitals:   03/18/15 2351 03/19/15 0017 03/19/15 0351 03/19/15 0820  BP: 164/79  157/80   Pulse: 87  82   Temp:  99.8 F (37.7 C) 99 F (37.2 C) 98.7 F (37.1 C)  TempSrc:   Oral Oral  Resp: 12  18   Height:      Weight:   180 lb 1.6 oz (81.693 kg)   SpO2: 93%  96%    General:  No distress Lungs:  Decreased breath sounds at the bases.   Heart:  RRR Abdomen:  Positive bowel sounds, no rebound no guarding Extremities:  No edema, right wrist without bleeding and only mild bruising. Neuro:  Nonfocal  LABS: Lab Results  Component Value Date   TROPONINI 1.27* 03/18/2015   Results for orders placed or performed during the hospital encounter of 03/17/15 (from the past 24 hour(s))  Basic metabolic panel     Status: Abnormal   Collection Time: 03/18/15 10:30 AM  Result Value Ref Range   Sodium 137 135 - 145 mmol/L   Potassium 3.1 (L) 3.5 - 5.1 mmol/L   Chloride 99 (L) 101 - 111 mmol/L   CO2 25 22 - 32 mmol/L   Glucose, Bld 418 (H) 65 - 99 mg/dL   BUN 17 6 - 20 mg/dL   Creatinine, Ser 0.981.58 (H) 0.61 - 1.24 mg/dL   Calcium 8.4 (L) 8.9 - 10.3 mg/dL   GFR calc non Af Amer 46 (L) >60 mL/min   GFR calc Af Amer 53 (L) >60 mL/min   Anion gap 13 5 - 15  CBC     Status: Abnormal   Collection Time: 03/18/15 10:30 AM  Result Value Ref Range   WBC 7.9 4.0 - 10.5 K/uL   RBC 4.36 4.22 - 5.81 MIL/uL   Hemoglobin 12.5 (L) 13.0 - 17.0 g/dL   HCT 11.936.9 (L) 14.739.0 - 82.952.0 %   MCV 84.6 78.0 - 100.0 fL   MCH 28.7 26.0 - 34.0 pg   MCHC 33.9 30.0 - 36.0 g/dL   RDW 56.213.0 13.011.5 - 86.515.5 %   Platelets 200 150 - 400 K/uL  Heparin level (unfractionated)     Status: None   Collection Time: 03/18/15 10:30 AM  Result Value Ref Range   Heparin Unfractionated 0.70 0.30 - 0.70 IU/mL  Lipid panel     Status: Abnormal   Collection Time: 03/18/15 10:30 AM  Result Value Ref Range   Cholesterol 113 0 - 200  mg/dL   Triglycerides 74 <784<150 mg/dL   HDL 32 (L) >69>40 mg/dL   Total CHOL/HDL Ratio 3.5 RATIO   VLDL 15 0 - 40 mg/dL   LDL Cholesterol 66 0 - 99 mg/dL  Troponin I     Status: Abnormal   Collection Time: 03/18/15 10:30 AM  Result Value Ref Range   Troponin I 1.27 (HH) <0.031 ng/mL  Glucose, capillary     Status: Abnormal   Collection Time: 03/18/15 11:40 AM  Result Value Ref Range   Glucose-Capillary 406 (H) 65 - 99 mg/dL   Comment 1 Notify RN    Comment 2 Document in Chart   Glucose, capillary     Status: Abnormal   Collection Time: 03/18/15  2:06 PM  Result Value Ref Range   Glucose-Capillary 224 (H) 65 - 99 mg/dL   Comment 1 Notify RN  Comment 2 Document in Chart   Glucose, capillary     Status: Abnormal   Collection Time: 03/18/15  4:20 PM  Result Value Ref Range   Glucose-Capillary 175 (H) 65 - 99 mg/dL  Glucose, capillary     Status: Abnormal   Collection Time: 03/18/15 10:17 PM  Result Value Ref Range   Glucose-Capillary 198 (H) 65 - 99 mg/dL  Basic metabolic panel     Status: Abnormal   Collection Time: 03/19/15  5:20 AM  Result Value Ref Range   Sodium 136 135 - 145 mmol/L   Potassium 3.8 3.5 - 5.1 mmol/L   Chloride 100 (L) 101 - 111 mmol/L   CO2 24 22 - 32 mmol/L   Glucose, Bld 301 (H) 65 - 99 mg/dL   BUN 18 6 - 20 mg/dL   Creatinine, Ser 1.61 0.61 - 1.24 mg/dL   Calcium 8.3 (L) 8.9 - 10.3 mg/dL   GFR calc non Af Amer >60 >60 mL/min   GFR calc Af Amer >60 >60 mL/min   Anion gap 12 5 - 15  CBC     Status: Abnormal   Collection Time: 03/19/15  5:20 AM  Result Value Ref Range   WBC 7.5 4.0 - 10.5 K/uL   RBC 4.07 (L) 4.22 - 5.81 MIL/uL   Hemoglobin 11.8 (L) 13.0 - 17.0 g/dL   HCT 09.6 (L) 04.5 - 40.9 %   MCV 86.0 78.0 - 100.0 fL   MCH 29.0 26.0 - 34.0 pg   MCHC 33.7 30.0 - 36.0 g/dL   RDW 81.1 91.4 - 78.2 %   Platelets 206 150 - 400 K/uL  Heparin level (unfractionated)     Status: Abnormal   Collection Time: 03/19/15  5:52 AM  Result Value Ref Range    Heparin Unfractionated 0.25 (L) 0.30 - 0.70 IU/mL  Glucose, capillary     Status: Abnormal   Collection Time: 03/19/15  7:49 AM  Result Value Ref Range   Glucose-Capillary 416 (H) 65 - 99 mg/dL    Intake/Output Summary (Last 24 hours) at 03/19/15 0944 Last data filed at 03/19/15 0820  Gross per 24 hour  Intake 796.74 ml  Output   2260 ml  Net -1463.26 ml    ASSESSMENT AND PLAN:  NSTEMI:  Cath yesterday with severe diffuse LAD disease.  Likely a poor target for CABG.  He would not want surgery.  I will plan medical management.  This will be made difficult because of his orthostatic BP.  I will increase his Imdur.  I reviewed his cath films and discussed this at length.  He would likely refuse any consideration of CABG.  DYSPNEA:   I suspect an ongoing pulmonary process.  I will start a Zpac.  I will give a low dose of PO Lasix today.  Check an echo (pending).  I will also order a BNP.    CKD:  Creat OK.  No change in therapy.   HYPOKALEMIA:   Normokalemic today.      ORTHOSTATIC HYPOTENSION:    I will prescribe and abdominal binder.   Fayrene Fearing [facility] 03/19/2015 9:44 AM

## 2015-03-19 NOTE — Progress Notes (Signed)
Came to assess pt, to see if he was ready for CPAP for tonight. Pt stated that he didn't need any help setting up his CPAP, and also stated that he was not ready for bed, and once he was ready for bed he would let his RN know. To make RN aware that he is on the CPAP. Pt is stable at this time no complications noted.

## 2015-03-19 NOTE — Progress Notes (Signed)
Orthopedic Tech Progress Note Patient Details:  Timothy Alvarez Wearing 03/29/1955 [id number]  Ortho Devices Type of Ortho Device: Abdominal binder Ortho Device/Splint Interventions: Rich BraveOrdered   Derhonda Eastlick J 03/19/2015, 1:31 PM

## 2015-03-19 NOTE — Progress Notes (Addendum)
Inpatient Diabetes Program Recommendations  AACE/ADA: New Consensus Statement on Inpatient Glycemic Control (2015)  Target Ranges:  Prepandial:   less than 140 mg/dL      Peak postprandial:   less than 180 mg/dL (1-2 hours)      Critically ill patients:  140 - 180 mg/dL   Review of Glycemic Control:  Results for Timothy Alvarez, Timothy Alvarez (MRN [id number]) as of 03/19/2015 09:41  Ref. Range 03/18/2015 14:06 03/18/2015 16:20 03/18/2015 22:17 03/19/2015 07:49  Glucose-Capillary Latest Ref Range: 65-99 mg/dL 409224 (H) 811175 (H) 914198 (H) 416 (H)    Diabetes history: Type 1-Insulin pump Outpatient Diabetes medications:  Patient's insulin pump settings per Jessica PriestWanetta, CDE at [facility]:  Basal Rates- Midnight- 0.9 units/hr 11AM- 0.8 units/hr 5PM- 0.9 units/hr  Patient gets total of 21 units basal insulin per 24 hour period on insulin pump  Carbohydrate Ratio: MN- 1 unit for every 9 grams carbohydrates 43M- 1 unit for every 8 grams carbohydrates  Correction/Sensitivity Factor: 1 unit for every 40 mg/dl above target CBG  Target CBG: 90-120 mg/dl  Current orders for Inpatient glycemic control:  Lantus 20 units daily, Novolog moderate tid with meals and HS   Inpatient Diabetes Program Recommendations:    Please increase Lantus to 24 units daily.  Also consider reducing Novolog to sensitive (but increase frequency q 4 hours).  Patient also needs Novolog meal coverage 4 units tid with meals.  Thanks, Beryl MeagerJenny Shep Porter, RN, BC-ADM Inpatient Diabetes Coordinator Pager [phone] (8a-5p)   10:10 am-  Discussed with Dr. Antoine PocheHochrein and orders received.

## 2015-03-20 ENCOUNTER — Telehealth: Payer: Self-pay | Admitting: Cardiology

## 2015-03-20 ENCOUNTER — Inpatient Hospital Stay (HOSPITAL_COMMUNITY): Payer: Medicare Other

## 2015-03-20 ENCOUNTER — Other Ambulatory Visit: Payer: Self-pay

## 2015-03-20 DIAGNOSIS — I2510 Atherosclerotic heart disease of native coronary artery without angina pectoris: Secondary | ICD-10-CM

## 2015-03-20 LAB — BASIC METABOLIC PANEL
Anion gap: 12 (ref 5–15)
BUN: 19 mg/dL (ref 6–20)
CHLORIDE: 100 mmol/L — AB (ref 101–111)
CO2: 25 mmol/L (ref 22–32)
CREATININE: 1.17 mg/dL (ref 0.61–1.24)
Calcium: 8.8 mg/dL — ABNORMAL LOW (ref 8.9–10.3)
GFR calc Af Amer: >60 mL/min (ref >60)
GFR calc non Af Amer: >60 mL/min (ref >60)
GLUCOSE: 241 mg/dL — AB (ref 65–99)
Potassium: 3.7 mmol/L (ref 3.5–5.1)
SODIUM: 137 mmol/L (ref 135–145)

## 2015-03-20 LAB — GLUCOSE, CAPILLARY
GLUCOSE-CAPILLARY: 109 mg/dL — AB (ref 65–99)
GLUCOSE-CAPILLARY: 211 mg/dL — AB (ref 65–99)
GLUCOSE-CAPILLARY: 219 mg/dL — AB (ref 65–99)
Glucose-Capillary: 141 mg/dL — ABNORMAL HIGH (ref 65–99)

## 2015-03-20 MED ORDER — AZITHROMYCIN 250 MG PO TABS: 250.0000 mg | ORAL_TABLET | Freq: Once | ORAL | Status: DC

## 2015-03-20 MED ORDER — ISOSORBIDE MONONITRATE ER 60 MG PO TB24: 60.0000 mg | ORAL_TABLET | Freq: Every day | ORAL | Status: DC

## 2015-03-20 MED ORDER — LISINOPRIL 2.5 MG PO TABS: 10.0000 mg | ORAL_TABLET | Freq: Every day | ORAL | Status: DC

## 2015-03-20 MED ORDER — NITROGLYCERIN 0.4 MG SL SUBL: 0.4000 mg | SUBLINGUAL_TABLET | SUBLINGUAL | Status: DC | PRN

## 2015-03-20 MED FILL — Heparin Sodium (Porcine) 2 Unit/ML in Sodium Chloride 0.9%: INTRAMUSCULAR | Qty: 500 | Status: AC

## 2015-03-20 MED FILL — ISOSORBIDE MN ER 60 MG TAB: 60 | 30 days supply | Qty: 30 | Fill #0

## 2015-03-20 MED FILL — NITROGLYCERIN 0.4 MG TAB SL: 0.4 | 7 days supply | Qty: 25 | Fill #0

## 2015-03-20 MED FILL — AZITHROMYCIN 250 MG TABLET: 250 | 3 days supply | Qty: 3 | Fill #0

## 2015-03-20 MED FILL — LISINOPRIL 2.5 MG TABLET: 2.5 | 7 days supply | Qty: 30 | Fill #0

## 2015-03-20 NOTE — Discharge Summary (Signed)
Discharge Summary   Patient ID: Timothy Alvarez,  MRN: [id number], DOB/AGE: Jul 21, 1955 60 y.o.  Admit date: 03/17/2015 Discharge date: 03/20/2015  Primary Care Provider: Leeanne Rio Primary Cardiologist: Kirk Ruths  Discharge Diagnoses   Acute non-ST elevation myocardial infarction (NSTEMI) [facility])   CAD   Chronic orthostatic hypotension    DM   Contrast-induced nephropathy   OSA on CPAP   Hypokalemia  Allergies No Known Allergies  Consultant: Diabetes Coordinator  Procedures  Procedures    Left Heart Cath and Coronary Angiography  03/18/2015    Conclusion    1. Mid RCA lesion, 30% stenosed. The lesion was previously treated with a stent (unknown type). 2. Mid RCA to Dist RCA lesion, 50% stenosed. 3. 1st RPLB lesion, 90% stenosed. 4. RPDA-2 lesion, 60% stenosed. 5. RPDA-1 lesion, 65% stenosed. 6. Ost RCA to Prox RCA lesion, 50% stenosed. 7. Ost 1st Mrg to 1st Mrg lesion, 95% stenosed. 8. 1st Mrg lesion, 75% stenosed. 9. 1st Diag lesion, 95% stenosed. 10. Mid LAD lesion, 99% stenosed. 11. Dist LAD lesion, 90% stenosed. 92. Ost LAD to Prox LAD lesion, 75% stenosed.   Severe diffuse LAD, and circumflex coronary artery disease. Diffuse disease in the distal RCA.  Patent mid right coronary stent.  Left ventriculography was not performed.LVEDP is 24 mmHg   Recommendations:   Continue aggressive medical therapy. If the patient has never formally had a surgical consultation for CABG, it would be appropriate to get a surgical opinion at this time. The diffuse nature of the patient's coronary disease makes PCI high risk and unlikely to be of benefit. This could also make the patient non-surgical (inadequate targets for grafting).   Echo 03/20/2015 LV EF: 50% -  55%  ------------------------------------------------------------------- Indications:   CAD of native vessels  414.01.  ------------------------------------------------------------------- History:  PMH:  Dyspnea. PMH:  Myocardial infarction. Risk factors: Diabetes mellitus.  ------------------------------------------------------------------- Study Conclusions  - Left ventricle: distal septal and apical hypokinesis. The cavity size was mildly dilated. Wall thickness was normal. Systolic function was normal. The estimated ejection fraction was in the range of 50% to 55%. Doppler parameters are consistent with elevated ventricular end-diastolic filling pressure. - Mitral valve: Severe posterior annular calcification. - Atrial septum: No defect or patent foramen ovale was identified.   History of Present Illness  60 yo with history of CAD and chronic orthostatic hypotension presented 03/18/15 with exertional dyspnea and was noted to have TnI 3.99 in the ER. He had his first MI in 2010 in [place]. He had subsequent coronary events in [place] prior to moving to [place] in 2014. He had cath in [facility] in 9/14. This showed 80% mRCA that was treated with DES. He had residual severe LAD disease with up to 90% stenosis in the mid LAD. The LAD was small in caliber and diffusely diseased. It was not thought to be a good target for CABG or PCI. 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 He is taking midodrine and Florinef.  About 3 weeks ago prior admission, he developed head and chest congestion. He noted increased dyspnea and would have to stop twice to catch his breath on his regular walk. He would also feel chest heaviness around the time he got short of breath. Saw PCP, was given a course of Augmentin (completed). However, over the last few days, symptoms worsened. Now short of breath and getting chest pressure with any exertion, comfortable at rest. Went to ER tonight for evaluation. CXR  showed possible pulmonary edema and troponin was  elevated at 3.99. He was admitted. Last echo in 2014 with EF 60-65%.  Hospital Course Repeat troponin was 2.09 and felt subacute. Stated on IV heparin and Lipitor increased to '80mg'$ . He has hx of contrast-induced nephropathy, creatinine 1.46 however did not hydrate given acute CHF.Plan made to limit contrast. CKD: Creatinine 1.46. Cath showed diffuse severe disease.  Plan for medical management. Poor target for CABG as well as patient does not wanted to proceed. Increased imdur to '60mg'$ .   Acute CHF/Dyspnea: Given IV lasix '40mg'$  x 1 and taken to cath labs. The patient had a temp of 101 with cough and wheezing. Felt dyspnea likely from respiratory. Given IV lasix '10mg'$  x1. Started on ZPAK and breathing improved.   Orthostatic hypotension: With volume overload discontinued Florinef. Continued midodrine. He did not require the binder in hospital, however advised to take it home.  CKD: Creatinine 1.46. Used limited dye during cath. Creatinine stabilised to 1.17 at day of discharge.   DM: The patient was using insulin pump at home. Placed on SSI, however due to elevated blood glucose consulted diabetic nurse for insulin management.   Hypokalemia: Resolved with supplement.   She has been seen by Dr. Percival Spanish today and deemed ready for discharge home. All follow-up appointments have been scheduled. Discharge medications are listed below.   LDL of 66. Will continue home dose of Lipitor '40mg'$ . He will finishe course of Zpack. He was advised to see by PCP or go to ER if worsening cough or elevated temperature. Echo at day of discharged showed LV EF of 50-55% with distal septal and apical hypokinesis and elevated ventricular end-diastolic filling pressure. Please discuses result with patient during TCM.   Discharge Vitals Blood pressure 134/76, pulse 72, temperature 98.6 F (37 C), temperature source Oral, resp. rate 18, height '5\' 6"'$  (1.676 m), weight 180 lb 9.6 oz (81.92 kg), SpO2 94 %.  Filed Weights    03/18/15 0514 03/19/15 0351 03/20/15 0400  Weight: 178 lb 8 oz (80.967 kg) 180 lb 1.6 oz (81.693 kg) 180 lb 9.6 oz (81.92 kg)    Labs  CBC  Recent Labs  03/17/15 1930 03/18/15 1030 03/19/15 0520  WBC 8.3 7.9 7.5  NEUTROABS 6.3  --   --   HGB 12.4* 12.5* 11.8*  HCT 38.0* 36.9* 35.0*  MCV 85.8 84.6 86.0  PLT 218 200 086   Basic Metabolic Panel  Recent Labs  03/19/15 0520 03/20/15 1230  NA 136 137  K 3.8 3.7  CL 100* 100*  CO2 24 25  GLUCOSE 301* 241*  BUN 18 19  CREATININE 1.24 1.17  CALCIUM 8.3* 8.8*   Liver Function Tests  Recent Labs  03/17/15 1930  AST 30  ALT 17  ALKPHOS 110  BILITOT 1.0  PROT 7.0  ALBUMIN 3.7    Recent Labs  03/17/15 1930  LIPASE 14   Cardiac Enzymes  Recent Labs  03/17/15 1930 03/18/15 0055 03/18/15 1030  TROPONINI 3.99* 2.90* 1.27*   Fasting Lipid Panel  Recent Labs  03/18/15 1030  CHOL 113  HDL 32*  LDLCALC 66  TRIG 74  CHOLHDL 3.5   Disposition  Pt is being discharged home today in good condition.  Follow-up Plans & Appointments  Follow-up Information    Follow up with [facility], PA-C. Go on 03/27/2015.   Specialties:  Cardiology, Radiology   Why:  '@10'$ :30 am for TCM   Contact information:   [address] [place] [address] [phone]           Discharge Instructions    Amb Referral to Cardiac Rehabilitation    Complete by:  As directed   Diagnosis:  Myocardial Infarction Comment - to [place]     Diet - low sodium heart healthy    Complete by:  As directed      Discharge instructions    Complete by:  As directed   NO HEAVY LIFTING (>10lbs) X 2 WEEKS. NO SEXUAL ACTIVITY X 2 WEEKS. NO DRIVING X 1 WEEK. NO SOAKING BATHS, HOT TUBS, POOLS, ETC., X 7 DAYS.   If worsening cough and continues to have fever - go to ER or see by PCP.     Increase activity slowly    Complete by:  As directed            F/u Labs/Studies: None  Discharge Medications    Medication  List    STOP taking these medications        fludrocortisone 0.1 MG tablet  Commonly known as:  FLORINEF      TAKE these medications        acetaminophen 325 MG tablet  Commonly known as:  TYLENOL  Take 650 mg by mouth every 6 (six) hours as needed for mild pain.     aspirin 81 MG tablet  Take 81 mg by mouth daily.     atorvastatin 40 MG tablet  Commonly known as:  LIPITOR  Take 1 tablet (40 mg total) by mouth daily.     azithromycin 250 MG tablet  Commonly known as:  ZITHROMAX  Take 1 tablet (250 mg total) by mouth once.  Start taking on:  03/21/2015     benzonatate 100 MG capsule  Commonly known as:  TESSALON  Take 1 capsule (100 mg total) by mouth 2 (two) times daily as needed for cough.     co-enzyme Q-10 30 MG capsule  Take 30 mg by mouth daily.     DAYQUIL MULTI-SYMPTOM COLD/FLU PO  Take 1 capsule by mouth every 6 (six) hours as needed (cold/flu symptoms).     fluticasone 50 MCG/ACT nasal spray  Commonly known as:  FLONASE  Place 2 sprays into both nostrils daily.     gabapentin 600 MG tablet  Commonly known as:  NEURONTIN  TAKE 1 TABLET BY MOUTH 3 TIMES DAILY.     glucagon 1 MG injection  Inject 1 mg into the vein once as needed (Emergency Test Kit).     glucose blood test strip  1 each by Other route as directed. Use as instructed FREESTYLE IN VITRO     HYDROcodone-acetaminophen 5-325 MG tablet  Commonly known as:  NORCO/VICODIN  Take 1 tablet by mouth every 6 (six) hours as needed for severe pain.     insulin lispro 100 UNIT/ML injection  Commonly known as:  HUMALOG  Inject 1-40 Units into the skin as directed. Use in Insulin Pump, Max daily dose: 100 units/day     isosorbide mononitrate 60 MG 24 hr tablet  Commonly known as:  IMDUR  Take 1 tablet (60 mg total) by mouth daily.  Start taking on:  03/21/2015     lisinopril 2.5 MG tablet  Commonly known as:  PRINIVIL,ZESTRIL  Take 4 tablets (10 mg total) by mouth daily.     meclizine 12.5 MG tablet   Commonly known as:  ANTIVERT  Take 1 tablet (12.5 mg total) by mouth 3 (three) times  daily as needed for dizziness.     metoCLOPramide 10 MG tablet  Commonly known as:  REGLAN  Take 1 tablet (10 mg total) by mouth every 8 (eight) hours as needed for nausea.     midodrine 10 MG tablet  Commonly known as:  PROAMATINE  TAKE 1 TABLET BY MOUTH TWICE DAILY     nitroGLYCERIN 0.4 MG SL tablet  Commonly known as:  NITROSTAT  Place 1 tablet (0.4 mg total) under the tongue every 5 (five) minutes x 3 doses as needed for chest pain.     SENEXON-S 8.6-50 MG tablet  Generic drug:  senna-docusate  TAKE 1 TABLET BY MOUTH DAILY.     zolpidem 12.5 MG CR tablet  Commonly known as:  AMBIEN CR  Take 1 tablet (12.5 mg total) by mouth at bedtime.        Duration of Discharge Encounter   Greater than 30 minutes including physician time.  Signed, Bhagat,Bhavinkumar PA-C 03/20/2015, 4:24 PM   Patient seen and examined.  Plan as discussed in my rounding note for today and outlined above. Minus Breeding  03/20/2015  4:57 PM

## 2015-03-20 NOTE — Care Management Important Message (Signed)
Important Message  Patient Details  Name: Timothy Alvarez MRN: [id number] Date of Birth: [birth year]   Medicare Important Message Given:  Yes    Darrold SpanWebster, Sheryn Aldaz Hall, RN 03/20/2015, 11:56 AM

## 2015-03-20 NOTE — Telephone Encounter (Signed)
New message      TCM appt on 03-27-15 with Timothy Alvarez---per Vinn

## 2015-03-20 NOTE — Progress Notes (Signed)
  Echocardiogram 2D Echocardiogram has been performed.  Timothy Alvarez, Timothy Alvarez 03/20/2015, 3:59 PM

## 2015-03-20 NOTE — Discharge Instructions (Signed)
Cardiac Rehabilitation Cardiac rehabilitation is a medically supervised program that helps improve the health and well-being of people with heart problems. Cardiac rehabilitation includes exercise training, education, and counseling to help you get stronger and return to an active lifestyle. People who participate in cardiac rehabilitation programs get better faster and reduce future hospital stays. Cardiac rehabilitation programs can help when you have had the following conditions:  Heart attack.  Heart failure.  Peripheral artery disease.  Coronary artery disease.  Angina.  Lung or breathing problems. Cardiac rehabilitation programs are also used when you have the following procedures:  Coronary artery bypass graft surgery.  Heart valve replacement.  Heart stent placement.  Heart transplant.  Aneurysm repair. CARDIAC REHABILITATION MAY HELP YOU:  Reduce problems like chest pain and trouble breathing.  Change risk factors that contribute to heart disease, such as:  Smoking.  High blood pressure.  High cholesterol.  Diabetes.  Being out of shape or not active.  Weighing more than 30% over your ideal weight.  Diet.  Improve your mental outlook so you feel:  Less depressed or "blue."  More hopeful.  Better about yourself.  More confident about taking care of yourself.  Get support from health experts as well as other people with similar problems.  Learn how to manage and understand your medicines.  Teach your family about your condition and how to participate in your recovery. WHAT HAPPENS IN CARDIAC REHABILITATION? You will be assessed by a cardiac rehabilitation team. They will check your health history and do a physical exam. You may need blood tests, stress tests, and other evaluations. You may not start a cardiac rehabilitation program if:  You develop angina with exercise or while at rest.  You have severe heart failure that limits your  activity.  You have an abnormal heart rhythm at rest.  You develop heart rhythm problems during exercise.  You have high blood pressure that is not controlled. The cardiac rehabilitation team works with you to make a plan based on your health and goals. Everyone is unique, so each program is customized and your program may change as you progress. Members of a typical cardiac rehabilitation team may include such health professionals as:  Doctors.  Nurses.  Dietitians.  Psychologists.  Exercise specialists.  Physical and occupational therapists. A typical cardiac rehabilitation program is divided into phases. You advance from one phase to the next. Most cardiac rehabilitation sessions last for 60 minutes, 3 times a week.  Phase One starts while you are still in the hospital. You may start by walking in your room and then in the hall. You may start some simple exercises with a therapist. Health care team members will give you information and ask you many questions. You may not be able to remember details, so have a family member or an advocate with you to help keep track of information.  Phase Two begins when you go home or to another facility. This phase may last 8 to 12 weeks. You will travel to a cardiac rehabilitation center or a place where it is offered. Typically, you gradually increase your activity while being closely watched by a nurse or therapist. Exercises may be a combination of strength or resistance training and "cardio" or aerobic movement on a treadmill or other machines. Your condition will determine how often and how long these sessions will last.  In phase two, you may learn how to cook healthy meals, control your blood sugar, and manage your medicines. You may need  help with scheduling or planning how and when to take your medicines. Use a timer, divided pill box, or follow a form to make taking your medicines easier. Use the method that works best for you. Some medicines  should not be taken with certain foods. If you take more than one blood pressure medicine, you may need to stagger the times you take them. Taking all your blood pressure medicine at the same time may lower your blood pressure too much. If you have questions about your medicines, ask your health care provider questions until you understand.  Phase Three continues for the rest of your life. There will be less supervision. You may still participate in cardiac rehabilitation activities or become part of a group in your community. You may benefit from talking to other people about your experience if they are facing similar challenges. How soon you drive, have sex, or return to work will depend on your condition. These decisions should be made by you and your health care provider. If you need help, ask for it. Find out where you can get the help you need. Ask questions until you get answers and understand. SEEK IMMEDIATE MEDICAL CARE IF:  Get medical help at once if you experience any of the following symptoms:  Severe chest discomfort, especially if the pain is crushing or pressure-like and spreads to the arms, back, neck, or jaw. Do not wait to see if the pain will go away.  Weakness or numbness in your face, arms, or legs, especially on one side of the body; slurred speech; confusion; sudden severe headache or loss of vision (all symptoms of stroke).  You have shortness of breath.  You are sweating and feel sick to your stomach (nausea).  You feel dizzy or faint.  You experience profound tiredness (fatigue). Call your local emergency service (911 in the [place] Do not drive yourself to the hospital.   This information is not intended to replace advice given to you by your health care provider. Make sure you discuss any questions you have with your health care provider.   Document Released: 12/08/2007 Document Revised: 03/21/2014 Document Reviewed: 06/04/2010 Elsevier Interactive Patient Education  2016 Elsevier Inc.  Coronary Artery Disease, Male Coronary artery disease (CAD) is a process in which the blood vessels of the heart (coronary arteries) become narrow or blocked. The narrowing or blockage can lead to decreased blood flow to the heart muscle (angina). Prolonged reduced blood flow can cause a heart attack (myocardial infarction, MI). Because CAD is the leading cause of death in men, it is important to understand what causes this condition and how it is treated. CAUSES Atherosclerosis is the cause of CAD. This is the buildup of fat and cholesterol (plaque) on the inside of the arteries. Over time, the plaque may narrow or block the artery, and this will lessen blood flow to the heart. Plaque can also become weak and break off within a coronary artery to form a clot and cause a sudden blockage. RISK FACTORS Many risk factors increase your chances of getting CAD, including:  High cholesterol levels.  High blood pressure (hypertension).  Tobacco use.  Diabetes.  Age. Men over age 36 are at a greater risk of CAD.  Gender. Men often develop CAD earlier in life than women.  Family history of CAD.  Obesity.  Lack of exercise.  A diet high in saturated fats. SYMPTOMS  Many people do not experience any symptoms during the early stages of CAD. As the  condition progresses, symptoms may include:   Chest pain.  The pain can be described as a crushing or squeezing in the chest, or a tightness, pressure, fullness, or heaviness in the chest.  The pain can last more than a few minutes or can stop and recur.  Pain in the arms, neck, jaw, or back.  Unexplained heartburn or indigestion.  Shortness of breath.  Nausea.  Sudden cold sweats. Less common symptoms of CAD in men can include:  Fatigue.  Unexplained feelings of nervousness or anxiety.  Weakness.  Diarrhea.  Sudden light-headedness. DIAGNOSIS  Tests to diagnose CAD may include:  ECG  (electrocardiogram).  Exercise stress test. This looks for signs of blockage when the heart is being exercised.  Pharmacologic stress test. This test looks for signs of blockage when the heart is being stressed with a medicine.  Blood tests.  Coronary angiogram. This is a procedure to look at the coronary arteries to see if there is any blockage. TREATMENT The treatment of CAD may include the following:  Healthy behavioral changes to reduce or control risk factors.  Medicine.  Coronary stenting.A stent helps to keep an artery open.  Coronary angioplasty. This procedure widens a narrowed or blocked artery.  Coronary arterybypass surgery. This will allow your blood to pass the blockage (bypass) to reach your heart. HOME CARE INSTRUCTIONS  Take medicines only as directed by your health care provider.  Do not take the following medicines unless your health care provider approves:  Nonsteroidal anti-inflammatory drugs (NSAIDs), such as ibuprofen, naproxen, or celecoxib.  Vitamin supplements that contain vitamin A, vitamin E, or both.  Manage other health conditions such as hypertension and diabetes as directed by your health care provider.  Follow a heart-healthy diet. A dietitian can help to educate you about healthy food options and changes.  Use healthy cooking methods such as roasting, grilling, broiling, baking, poaching, steaming, or stir-frying. Talk to a dietitian to learn more about healthy cooking methods.  Follow an exercise program approved by your health care provider.  Maintain a healthy weight. Lose weight as approved by your health care provider.  Plan rest periods when fatigued.  Learn to manage stress.  Do not use any tobacco products, including cigarettes, chewing tobacco, or electronic cigarettes. If you need help quitting, ask your health care provider.  If you drink alcohol, and your health care provider approves, limit your alcohol intake to no more  than 1 drink per day. One drink equals 12 ounces of beer, 5 ounces of wine, or 1 ounces of hard liquor.  Stop illegal drug use.  Your health care provider may ask you to monitor your blood pressure. A blood pressure reading consists of a higher number over a lower number, such as 110 over 72, which is written as 110/72. Ideally, your blood pressure should be:  Below 140/90 if you have no other medical conditions.  Below 130/80 if you have diabetes or kidney disease.  Keep all follow-up visits as directed by your health care provider. This is important. SEEK IMMEDIATE MEDICAL CARE IF:  You have pain in your chest, neck, arm, jaw, stomach, or back that lasts more than a few minutes, is recurring, or is unrelieved by taking medicine under your tongue (sublingualnitroglycerin).  You have profuse sweating without cause.  You have unexplained:  Heartburn or indigestion.  Shortness of breath or difficulty breathing.  Nausea or vomiting.  Fatigue.  Feelings of nervousness or anxiety.  Weakness.  Diarrhea.  You have sudden  light-headedness or dizziness.  You faint. These symptoms may represent a serious problem that is an emergency. Do not wait to see if the symptoms will go away. Get medical help right away. Call your local emergency services (911 in the [place] Do not drive yourself to the hospital.   This information is not intended to replace advice given to you by your health care provider. Make sure you discuss any questions you have with your health care provider.   Document Released: 09/25/2013 Document Reviewed: 09/25/2013 Elsevier Interactive Patient Education [employer]2016 Elsevier Inc.

## 2015-03-20 NOTE — Progress Notes (Addendum)
Called and spoke with patient by phone.  Patient is to d/c home today.  Reminded patient that he received 24 units Lantus insulin this morning at 10am.  Instructed patient to resume his insulin pump tomorrow AM (01/07) around 8am and give himself SQ injections for supper tonight to cover his CBG and carbohydrate intake.  Told patient that if he decides to resume his insulin pump this evening when he gets home, reminded patient to suspend his basal rates until 8am tomorrow (01/07).  Patient stated he can do either way and knows how to operate his basal rates on his pump.  This was suggested to patient since we do not want the Lantus insulin and his basal rates on his pump to overlap which could cause Hypoglycemia at home.  Patient agreeable to plan.    --Will follow patient during hospitalization--  Ambrose FinlandJeannine Johnston Hesper Venturella RN, MSN, CDE Diabetes Coordinator Inpatient Glycemic Control Team Team Pager: [phone] (8a-5p)

## 2015-03-20 NOTE — Progress Notes (Signed)
Utilization review completed.  

## 2015-03-20 NOTE — Progress Notes (Signed)
Pt discharged to home via D/C, accompanied by family member, condition stable, discharge instructions complete, verbalized understanding of all d/c instructions.  Raymon MuttonGwen Mishika Flippen RN

## 2015-03-20 NOTE — Progress Notes (Signed)
CARDIAC REHAB PHASE I   PRE:  Rate/Rhythm: 78 SR    BP: lying 157/81, sitting 150/87, standing 126/62    SaO2: 364  MODE:  Ambulation: 87 SR ft   POST:  Rate/Rhythm: 127/72    BP: sitting 127/72     SaO2: 98 RA  Pt feeling better today. Able to stand and walk with min assist x2 (had been for orthostasis but not significantly orthostatic). Slight imbalance due to his neuropathy. No c/o walking. Return to EOB. Ed completed for MI, diet, ex and CRPII. [facility] as pt is thinking about doing the program. Did not discuss NTG as pt sts he has not had prescription in the past and he does not get CP. Encouraged more walking today and sitting up in recliner. [phone]   Elissa LovettReeve, Kiven Vangilder Connelly SpringsKristan CES, ACSM 03/20/2015 10:10 AM

## 2015-03-20 NOTE — Progress Notes (Signed)
Patient Name: Timothy Alvarez Date of Encounter: 03/20/2015   SUBJECTIVE  Feeling better today. No dizziness while walking this morning. Temp of 101.1 yesterday. Still coughing. On Zpak. Hasn't placed abdominal binder yet.   CURRENT MEDS . antiseptic oral rinse  7 mL Mouth Rinse BID  . aspirin  81 mg Oral Daily  . atorvastatin  80 mg Oral Daily  . azithromycin  250 mg Oral Daily  . Chlorhexidine Gluconate Cloth  6 each Topical Q0600  . fluticasone  2 spray Each Nare Daily  . gabapentin  600 mg Oral TID  . insulin aspart  0-9 Units Subcutaneous 6 times per day  . insulin aspart  4 Units Subcutaneous TID WC  . insulin glargine  24 Units Subcutaneous Daily  . isosorbide mononitrate  60 mg Oral Daily  . lisinopril  2.5 mg Oral Daily  . midodrine  10 mg Oral BID WC  . mupirocin ointment  1 application Nasal BID  . senna-docusate  1 tablet Oral Daily  . sodium chloride  3 mL Intravenous Q12H  . sodium chloride  3 mL Intravenous Q12H    OBJECTIVE  Filed Vitals:   03/19/15 2005 03/20/15 0006 03/20/15 0400 03/20/15 0807  BP: 129/77 126/66 145/83 138/77  Pulse: 73 71 72 71  Temp: 99.7 F (37.6 C) 98.4 F (36.9 C) 98.2 F (36.8 C) 99.1 F (37.3 C)  TempSrc: Oral Oral Oral Oral  Resp: 16 19 16 17   Height:      Weight:   180 lb 9.6 oz (81.92 kg)   SpO2: 99% 100% 100% 95%    Intake/Output Summary (Last 24 hours) at 03/20/15 1039 Last data filed at 03/20/15 0900  Gross per 24 hour  Intake    780 ml  Output    900 ml  Net   -120 ml   Filed Weights   03/18/15 0514 03/19/15 0351 03/20/15 0400  Weight: 178 lb 8 oz (80.967 kg) 180 lb 1.6 oz (81.693 kg) 180 lb 9.6 oz (81.92 kg)    PHYSICAL EXAM  General: Pleasant, NAD. Neuro: Alert and oriented X 3. Moves all extremities spontaneously. Psych: Normal affect. HEENT:  Normal  Neck: Supple without bruits or JVD. Lungs:  Resp regular and unlabored. Diminished breath sound bibasilar with faint rales.  Heart: RRR no s3, s4, or  murmurs. Abdomen: Soft, non-tender, non-distended, BS + x 4.  Extremities: No clubbing, cyanosis or edema. DP/PT/Radials 2+ and equal bilaterally.  Accessory Clinical Findings  CBC  Recent Labs  03/17/15 1930 03/18/15 1030 03/19/15 0520  WBC 8.3 7.9 7.5  NEUTROABS 6.3  --   --   HGB 12.4* 12.5* 11.8*  HCT 38.0* 36.9* 35.0*  MCV 85.8 84.6 86.0  PLT 218 200 206   Basic Metabolic Panel  Recent Labs  03/18/15 1030 03/19/15 0520  NA 137 136  K 3.1* 3.8  CL 99* 100*  CO2 25 24  GLUCOSE 418* 301*  BUN 17 18  CREATININE 1.58* 1.24  CALCIUM 8.4* 8.3*   Liver Function Tests  Recent Labs  03/17/15 1930  AST 30  ALT 17  ALKPHOS 110  BILITOT 1.0  PROT 7.0  ALBUMIN 3.7    Recent Labs  03/17/15 1930  LIPASE 14   Cardiac Enzymes  Recent Labs  03/17/15 1930 03/18/15 0055 03/18/15 1030  TROPONINI 3.99* 2.90* 1.27*   Fasting Lipid Panel  Recent Labs  03/18/15 1030  CHOL 113  HDL 32*  LDLCALC 66  TRIG 74  CHOLHDL 3.5    TELE  Sinus rhythm  Radiology/Studies  Dg Chest 2 View  03/17/2015  CLINICAL DATA:  Cough and chills.  Persistent symptoms for 2 weeks. EXAM: CHEST  2 VIEW COMPARISON:  04/24/2013 FINDINGS: Heart at the upper limits normal in size, however increased from prior exam. Increased interstitial thickening from prior. No consolidation, pleural effusion, or pneumothorax. No acute osseous abnormalities are seen. IMPRESSION: Increased interstitial thickening, pulmonary edema versus atypical infection/bronchitis. Heart is at the upper limits normal in size, however the has been minimal increase in size from prior. Electronically Signed   By: Rubye OaksMelanie  Ehinger M.D.   On: 03/17/2015 20:53    ASSESSMENT AND PLAN  1. NSTEMI  - Plan for medical management. Poor target for CABG as well as patient does not wanted to proceed.  - Continue current medications.  2. Dyspnea - Breathing improved. Still coughing. Temp of 101.1 yesterday. Still coughing. On  Zpak. Repeat BNP of 377.5. Given IV lasix 10mg  x 1 yesterday.   3. CKD - Hasn't got BMET today. Will order.   4. Orthostatic hypotension - On Midodrine. Hasn't placed abdominal binder yet.  - No dizziness with ambulation today.   5. DM - Insulin adjusted yesterday   Signed, Bhagat,Bhavinkumar PA-C  History and all data above reviewed.  Patient examined.  I agree with the findings as above.  He feels much better than he did yesterday.  No fever today.  Echo is pending and labs pending The patient exam reveals COR:RRR  ,  Lungs: Clear  ,  Abd: Positive bowel sounds, no rebound no guarding , Ext no edema  .  All available labs, radiology testing, previous records reviewed. Agree with documented assessment and plan. CAD:  Medical management.  Continue current therapy.  Dyspnea:  Improved.  Home on Zpac.  CKD:  Home if BMET OK.  Orthostasis:  He did not require the binder in hospital but can take it home.      Fayrene FearingJames Reshard Guillet  12:15 PM  03/20/2015

## 2015-03-24 NOTE — Telephone Encounter (Signed)
Left voice message to call back (for TCM)

## 2015-03-25 NOTE — Telephone Encounter (Signed)
LMTCB

## 2015-03-27 ENCOUNTER — Ambulatory Visit (INDEPENDENT_AMBULATORY_CARE_PROVIDER_SITE_OTHER): Payer: Medicare Other | Admitting: Cardiology

## 2015-03-27 ENCOUNTER — Encounter: Payer: Self-pay | Admitting: Cardiology

## 2015-03-27 VITALS — BP 154/80 | HR 75 | Ht 66.0 in | Wt 186.6 lb

## 2015-03-27 DIAGNOSIS — I214 Non-ST elevation (NSTEMI) myocardial infarction: Secondary | ICD-10-CM

## 2015-03-27 DIAGNOSIS — G4733 Obstructive sleep apnea (adult) (pediatric): Secondary | ICD-10-CM | POA: Diagnosis not present

## 2015-03-27 DIAGNOSIS — E1121 Type 2 diabetes mellitus with diabetic nephropathy: Secondary | ICD-10-CM

## 2015-03-27 DIAGNOSIS — I951 Orthostatic hypotension: Secondary | ICD-10-CM

## 2015-03-27 DIAGNOSIS — I2510 Atherosclerotic heart disease of native coronary artery without angina pectoris: Secondary | ICD-10-CM

## 2015-03-27 DIAGNOSIS — G894 Chronic pain syndrome: Secondary | ICD-10-CM

## 2015-03-27 DIAGNOSIS — Z9861 Coronary angioplasty status: Secondary | ICD-10-CM

## 2015-03-27 DIAGNOSIS — R5383 Other fatigue: Secondary | ICD-10-CM | POA: Diagnosis not present

## 2015-03-27 DIAGNOSIS — Z794 Long term (current) use of insulin: Secondary | ICD-10-CM

## 2015-03-27 DIAGNOSIS — E785 Hyperlipidemia, unspecified: Secondary | ICD-10-CM

## 2015-03-27 LAB — TSH: TSH: 2.434 u[IU]/mL (ref 0.350–4.500)

## 2015-03-27 MED ORDER — CLOPIDOGREL BISULFATE 75 MG PO TABS: 75.0000 mg | ORAL_TABLET | Freq: Every day | ORAL | Status: DC

## 2015-03-27 MED FILL — CLOPIDOGREL 75 MG TABLET: 75 | 90 days supply | Qty: 90 | Fill #0

## 2015-03-27 NOTE — Assessment & Plan Note (Signed)
03/17/15- medical Rx

## 2015-03-27 NOTE — Assessment & Plan Note (Signed)
On pain chronic pain medication

## 2015-03-27 NOTE — Assessment & Plan Note (Signed)
Recumbent hypertension, orthostatic hypotension.

## 2015-03-27 NOTE — Assessment & Plan Note (Signed)
On Lipitor 40 mg. He has had problems with Lipitor in the past but says he was able to take Crestor.

## 2015-03-27 NOTE — Assessment & Plan Note (Signed)
"  sleeps a lot"

## 2015-03-27 NOTE — Assessment & Plan Note (Signed)
MI in 2010, RCA PCI in Sept 2014 ([facility]), NSTEMI 03/17/15- diffuse CAD, RCA patent, LVF normal- medical Rx

## 2015-03-27 NOTE — Patient Instructions (Signed)
Medication Instructions:  START Clopidogrel (Plavix) 75 mg - take 1 tablet by mouth daily.  >>A new prescription has been sent to the pharmacy electronically.  Labwork: Your physician recommends that you return for lab work TODAY.  Testing/Procedures: NONE  Follow-Up: Corine ShelterLuke Kilroy, PA-C, recommends that you schedule a follow-up appointment in 3 months with Dr Jens Somrenshaw in [place]. You will receive a reminder letter in the mail two months in advance. If you don't receive a letter, please call our office to schedule the follow-up appointment.  If you need a refill on your cardiac medications before your next appointment, please call your pharmacy.

## 2015-03-27 NOTE — Assessment & Plan Note (Signed)
IDDM 

## 2015-03-27 NOTE — Progress Notes (Signed)
03/27/2015 Timothy Alvarez   10-12-1955  [id number]  Primary Physician Leeanne Rio, PA-C Primary Cardiologist: Dr Stanford Breed ([facility])  HPI:  60 yo with history of CAD, IDDM, CRI, HLD,  and chronic orthostatic hypotension presented to the ED 03/17/15 with a NSTEMI. He had his first MI in 2010 in [place]. He had subsequent coronary events in [place] prior to moving to [place] in 2014. He had cath in [facility] in 9/14. This showed 80% mRCA that was treated with DES. He had residual severe LAD disease with up to 90% stenosis in the mid LAD. The LAD was small in caliber and diffusely diseased. It was not thought to be a good target for CABG or PCI.The pt was admitted and had a cath 03/18/15 that showed a patent RCA with diffuse LAD, CFX, OM, and Dx disease. It was felt the best course would be aggressive medical Rx. He is in the office today for f/u. He denies chest pain and overall says he is getting stronger. His only compliant is excessive fatigue and sleepiness. He is compliant with his C-pap and gets 8 hrs of sleep.    Current Outpatient Prescriptions  Medication Sig Dispense Refill  . acetaminophen (TYLENOL) 325 MG tablet Take 650 mg by mouth every 6 (six) hours as needed for mild pain.    Marland Kitchen aspirin 81 MG tablet Take 81 mg by mouth daily.    Marland Kitchen atorvastatin (LIPITOR) 40 MG tablet Take 1 tablet (40 mg total) by mouth daily. 90 tablet 3  . azithromycin (ZITHROMAX) 250 MG tablet Take 1 tablet (250 mg total) by mouth once. 3 each 0  . benzonatate (TESSALON) 100 MG capsule Take 1 capsule (100 mg total) by mouth 2 (two) times daily as needed for cough. 20 capsule 0  . co-enzyme Q-10 30 MG capsule Take 30 mg by mouth daily.     . fluticasone (FLONASE) 50 MCG/ACT nasal spray Place 2 sprays into both nostrils daily. 16 g 6  . gabapentin (NEURONTIN) 600 MG tablet Take 600 mg by mouth 2 (two) times daily.    Marland Kitchen glucagon 1 MG injection Inject 1 mg into the vein once as needed (Emergency Test Kit).      Marland Kitchen glucose blood test strip 1 each by Other route as directed. Use as instructed FREESTYLE IN VITRO    . HYDROcodone-acetaminophen (NORCO/VICODIN) 5-325 MG tablet Take 1 tablet by mouth every 6 (six) hours as needed for severe pain. 60 tablet 0  . insulin lispro (HUMALOG) 100 UNIT/ML injection Inject 1-40 Units into the skin as directed. Use in Insulin Pump, Max daily dose: 100 units/day    . isosorbide mononitrate (IMDUR) 60 MG 24 hr tablet Take 1 tablet (60 mg total) by mouth daily. 30 tablet 11  . lisinopril (PRINIVIL,ZESTRIL) 10 MG tablet Take 10 mg by mouth daily.  11  . meclizine (ANTIVERT) 12.5 MG tablet Take 1 tablet (12.5 mg total) by mouth 3 (three) times daily as needed for dizziness. 30 tablet 0  . metoCLOPramide (REGLAN) 10 MG tablet Take 1 tablet (10 mg total) by mouth every 8 (eight) hours as needed for nausea. 30 tablet 0  . midodrine (PROAMATINE) 10 MG tablet TAKE 1 TABLET BY MOUTH TWICE DAILY 60 tablet 2  . nitroGLYCERIN (NITROSTAT) 0.4 MG SL tablet Place 1 tablet (0.4 mg total) under the tongue every 5 (five) minutes x 3 doses as needed for chest pain. 25 tablet 12  . Pseudoephedrine-APAP-DM (DAYQUIL MULTI-SYMPTOM COLD/FLU PO) Take  1 capsule by mouth every 6 (six) hours as needed (cold/flu symptoms).    . SENEXON-S 8.6-50 MG tablet TAKE 1 TABLET BY MOUTH DAILY. 100 tablet 1  . zolpidem (AMBIEN CR) 12.5 MG CR tablet Take 1 tablet (12.5 mg total) by mouth at bedtime. 30 tablet 0  . clopidogrel (PLAVIX) 75 MG tablet Take 1 tablet (75 mg total) by mouth daily. 90 tablet 3   No current facility-administered medications for this visit.    No Known Allergies  Social History   Social History  . Marital Status: Married    Spouse Name: N/A  . Number of Children: 1  . Years of Education: N/A   Occupational History  . Not on file.   Social History Main Topics  . Smoking status: Never Smoker   . Smokeless tobacco: Not on file  . Alcohol Use: Yes     Comment: Rare  . Drug  Use: No  . Sexual Activity: Not on file   Other Topics Concern  . 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  They have one grown daughter.   Highest level of education:  Master's Degree     Review of Systems: General: negative for chills, fever, night sweats or weight changes.  Cardiovascular: negative for chest pain, dyspnea on exertion, edema, orthopnea, palpitations, paroxysmal nocturnal dyspnea or shortness of breath Dermatological: negative for rash Respiratory: negative for cough or wheezing Urologic: negative for hematuria Abdominal: negative for nausea, vomiting, diarrhea, bright red blood per rectum, melena, or hematemesis Neurologic: negative for visual changes, syncope, or dizziness All other systems reviewed and are otherwise negative except as noted above.    Blood pressure 154/80, pulse 75, height 5' 6"  (1.676 m), weight 186 lb 9.6 oz (84.641 kg).  General appearance: alert, cooperative and no distress Neck: no carotid bruit and no JVD Lungs: clear to auscultation bilaterally Heart: regular rate and rhythm Extremities: no edema Skin: Skin color, texture, turgor normal. 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 He has had problems with Lipitor in the past but says he was able to take Crestor.   Orthostatic hypotension Recumbent hypertension, orthostatic hypotension.   Chronic pain syndrome On pain chronic pain medication  Fatigue "sleeps a lot"   PLAN  Check TSH, add Plavix,  f/u with Dr Stanford Breed in [place] in 3 months. He did say he thinks he had problems with Lipitor previously with myalgias. He denies this now so will continue it. He thinks he took Crestor in the past without problems (changed secondary to insurance coverage).   Shakira Los K PA-C 03/27/2015 11:05 AM

## 2015-03-27 NOTE — Assessment & Plan Note (Signed)
On C-pap 

## 2015-03-30 NOTE — Telephone Encounter (Signed)
Results given to patient who verbalized understanding. Noted instructions to f/u w PCP and has appt w them on 24th of Jan.  Advised to call if new concerns.

## 2015-03-30 NOTE — Telephone Encounter (Signed)
Returning call,he thinks it is about his results.

## 2015-04-03 ENCOUNTER — Telehealth: Payer: Self-pay | Admitting: *Deleted

## 2015-04-03 NOTE — Telephone Encounter (Signed)
Cardiac rehab orders faxed.

## 2015-04-06 MED FILL — FLUDROCORTISONE 0.1 MG TAB: 0.1 | 30 days supply | Qty: 60 | Fill #2

## 2015-04-06 MED FILL — MIDODRINE HCL 10 MG TABLET: 10 | 30 days supply | Qty: 60 | Fill #2

## 2015-04-07 ENCOUNTER — Ambulatory Visit (INDEPENDENT_AMBULATORY_CARE_PROVIDER_SITE_OTHER): Payer: Medicare Other | Admitting: Physician Assistant

## 2015-04-07 ENCOUNTER — Encounter: Payer: Self-pay | Admitting: Physician Assistant

## 2015-04-07 VITALS — BP 118/70 | HR 70 | Temp 98.1°F | Ht 66.0 in | Wt 184.0 lb

## 2015-04-07 DIAGNOSIS — E1121 Type 2 diabetes mellitus with diabetic nephropathy: Secondary | ICD-10-CM | POA: Diagnosis not present

## 2015-04-07 DIAGNOSIS — Z794 Long term (current) use of insulin: Secondary | ICD-10-CM

## 2015-04-07 DIAGNOSIS — Z0000 Encounter for general adult medical examination without abnormal findings: Secondary | ICD-10-CM | POA: Diagnosis not present

## 2015-04-07 DIAGNOSIS — I2510 Atherosclerotic heart disease of native coronary artery without angina pectoris: Secondary | ICD-10-CM | POA: Diagnosis not present

## 2015-04-07 DIAGNOSIS — G894 Chronic pain syndrome: Secondary | ICD-10-CM

## 2015-04-07 DIAGNOSIS — Z125 Encounter for screening for malignant neoplasm of prostate: Secondary | ICD-10-CM

## 2015-04-07 DIAGNOSIS — Z9861 Coronary angioplasty status: Secondary | ICD-10-CM

## 2015-04-07 DIAGNOSIS — I951 Orthostatic hypotension: Secondary | ICD-10-CM

## 2015-04-07 NOTE — Patient Instructions (Signed)
Please go to the lab for blood work.  I will call you with your results. If your blood work is normal we will follow-up yearly for physicals.   Please continue medications as directed. Follow up with Cardiology and Endocrinology as directed. You will be contacted by [facility] for assessment.  Preventive Care for Adults, Male A healthy lifestyle and preventive care can promote health and wellness. Preventive health guidelines for men include the following key practices:  A routine yearly physical is a good way to check with your health care provider about your health and preventative screening. It is a chance to share any concerns and updates on your health and to receive a thorough exam.  Visit your dentist for a routine exam and preventative care every 6 months. Brush your teeth twice a day and floss once a day. Good oral hygiene prevents tooth decay and gum disease.  The frequency of eye exams is based on your age, health, family medical history, use of contact lenses, and other factors. Follow your health care provider's recommendations for frequency of eye exams.  Eat a healthy diet. Foods such as vegetables, fruits, whole grains, low-fat dairy products, and lean protein foods contain the nutrients you need without too many calories. Decrease your intake of foods high in solid fats, added sugars, and salt. Eat the right amount of calories for you.Get information about a proper diet from your health care provider, if necessary.  Regular physical exercise is one of the most important things you can do for your health. Most adults should get at least 150 minutes of moderate-intensity exercise (any activity that increases your heart rate and causes you to sweat) each week. In addition, most adults need muscle-strengthening exercises on 2 or more days a week.  Maintain a healthy weight. The body mass index (BMI) is a screening tool to identify possible weight problems. It provides an  estimate of body fat based on height and weight. Your health care provider can find your BMI and can help you achieve or maintain a healthy weight.For adults 20 years and older:  A BMI below 18.5 is considered underweight.  A BMI of 18.5 to 24.9 is normal.  A BMI of 25 to 29.9 is considered overweight.  A BMI of 30 and above is considered obese.  Maintain normal blood lipids and cholesterol levels by exercising and minimizing your intake of saturated fat. Eat a balanced diet with plenty of fruit and vegetables. Blood tests for lipids and cholesterol should begin at age 70 and be repeated every 5 years. If your lipid or cholesterol levels are high, you are over 50, or you are at high risk for heart disease, you may need your cholesterol levels checked more frequently.Ongoing high lipid and cholesterol levels should be treated with medicines if diet and exercise are not working.  If you smoke, find out from your health care provider how to quit. If you do not use tobacco, do not start.  Lung cancer screening is recommended for adults aged 37-80 years who are at high risk for developing lung cancer because of a history of smoking. A yearly low-dose CT scan of the lungs is recommended for people who have at least a 30-pack-year history of smoking and are a current smoker or have quit within the past 15 years. A pack year of smoking is smoking an average of 1 pack of cigarettes a day for 1 year (for example: 1 pack a day for 30 years or 2  packs a day for 15 years). Yearly screening should continue until the smoker has stopped smoking for at least 15 years. Yearly screening should be stopped for people who develop a health problem that would prevent them from having lung cancer treatment.  If you choose to drink alcohol, do not have more than 2 drinks per day. One drink is considered to be 12 ounces (355 mL) of beer, 5 ounces (148 mL) of wine, or 1.5 ounces (44 mL) of liquor.  Avoid use of street  drugs. Do not share needles with anyone. Ask for help if you need support or instructions about stopping the use of drugs.  High blood pressure causes heart disease and increases the risk of stroke. Your blood pressure should be checked at least every 1-2 years. Ongoing high blood pressure should be treated with medicines, if weight loss and exercise are not effective.  If you are 42-47 years old, ask your health care provider if you should take aspirin to prevent heart disease.  Diabetes screening is done by taking a blood sample to check your blood glucose level after you have not eaten for a certain period of time (fasting). If you are not overweight and you do not have risk factors for diabetes, you should be screened once every 3 years starting at age 59. If you are overweight or obese and you are 67-47 years of age, you should be screened for diabetes every year as part of your cardiovascular risk assessment.  Colorectal cancer can be detected and often prevented. Most routine colorectal cancer screening begins at the age of 76 and continues through age 63. However, your health care provider may recommend screening at an earlier age if you have risk factors for colon cancer. On a yearly basis, your health care provider may provide home test kits to check for hidden blood in the stool. Use of a small camera at the end of a tube to directly examine the colon (sigmoidoscopy or colonoscopy) can detect the earliest forms of colorectal cancer. Talk to your health care provider about this at age 73, when routine screening begins. Direct exam of the colon should be repeated every 5-10 years through age 84, unless early forms of precancerous polyps or small growths are found.  People who are at an increased risk for hepatitis B should be screened for this virus. You are considered at high risk for hepatitis B if:  You were born in a country where hepatitis B occurs often. Talk with your health care provider  about which countries are considered high risk.  Your parents were born in a high-risk country and you have not received a shot to protect against hepatitis B (hepatitis B vaccine).  You have HIV or AIDS.  You use needles to inject street drugs.  You live with, or have sex with, someone who has hepatitis B.  You are a man who has sex with other men (MSM).  You get hemodialysis treatment.  You take certain medicines for conditions such as cancer, organ transplantation, and autoimmune conditions.  Hepatitis C blood testing is recommended for all people born from [birth year] through 1965 and any individual with known risks for hepatitis C.  Practice safe sex. Use condoms and avoid high-risk sexual practices to reduce the spread of sexually transmitted infections (STIs). STIs include gonorrhea, chlamydia, syphilis, trichomonas, herpes, HPV, and human immunodeficiency virus (HIV). Herpes, HIV, and HPV are viral illnesses that have no cure. They can result in disability, cancer, and death.  If you are a man who has sex with other men, you should be screened at least once per year for:  HIV.  Urethral, rectal, and pharyngeal infection of gonorrhea, chlamydia, or both.  If you are at risk of being infected with HIV, it is recommended that you take a prescription medicine daily to prevent HIV infection. This is called preexposure prophylaxis (PrEP). You are considered at risk if:  You are a man who has sex with other men (MSM) and have other risk factors.  You are a heterosexual man, are sexually active, and are at increased risk for HIV infection.  You take drugs by injection.  You are sexually active with a partner who has HIV.  Talk with your health care provider about whether you are at high risk of being infected with HIV. If you choose to begin PrEP, you should first be tested for HIV. You should then be tested every 3 months for as long as you are taking PrEP.  A one-time screening for  abdominal aortic aneurysm (AAA) and surgical repair of large AAAs by ultrasound are recommended for men ages 69 to 44 years who are current or former smokers.  Healthy men should no longer receive prostate-specific antigen (PSA) blood tests as part of routine cancer screening. Talk with your health care provider about prostate cancer screening.  Testicular cancer screening is not recommended for adult males who have no symptoms. Screening includes self-exam, a health care provider exam, and other screening tests. Consult with your health care provider about any symptoms you have or any concerns you have about testicular cancer.  Use sunscreen. Apply sunscreen liberally and repeatedly throughout the day. You should seek shade when your shadow is shorter than you. Protect yourself by wearing long sleeves, pants, a wide-brimmed hat, and sunglasses year round, whenever you are outdoors.  Once a month, do a whole-body skin exam, using a mirror to look at the skin on your back. Tell your health care provider about new moles, moles that have irregular borders, moles that are larger than a pencil eraser, or moles that have changed in shape or color.  Stay current with required vaccines (immunizations).  Influenza vaccine. All adults should be immunized every year.  Tetanus, diphtheria, and acellular pertussis (Td, Tdap) vaccine. An adult who has not previously received Tdap or who does not know his vaccine status should receive 1 dose of Tdap. This initial dose should be followed by tetanus and diphtheria toxoids (Td) booster doses every 10 years. Adults with an unknown or incomplete history of completing a 3-dose immunization series with Td-containing vaccines should begin or complete a primary immunization series including a Tdap dose. Adults should receive a Td booster every 10 years.  Varicella vaccine. An adult without evidence of immunity to varicella should receive 2 doses or a second dose if he has  previously received 1 dose.  Human papillomavirus (HPV) vaccine. Males aged 11-21 years who have not received the vaccine previously should receive the 3-dose series. Males aged 22-26 years may be immunized. Immunization is recommended through the age of 27 years for any male who has sex with males and did not get any or all doses earlier. Immunization is recommended for any person with an immunocompromised condition through the age of 38 years if he did not get any or all doses earlier. During the 3-dose series, the second dose should be obtained 4-8 weeks after the first dose. The third dose should be obtained 24 weeks after  the first dose and 16 weeks after the second dose.  Zoster vaccine. One dose is recommended for adults aged 67 years or older unless certain conditions are present.  Measles, mumps, and rubella (MMR) vaccine. Adults born before [birth year] generally are considered immune to measles and mumps. Adults born in [birth year] or later should have 1 or more doses of MMR vaccine unless there is a contraindication to the vaccine or there is laboratory evidence of immunity to each of the three diseases. A routine second dose of MMR vaccine should be obtained at least 28 days after the first dose for students attending postsecondary schools, health care workers, or international travelers. People who received inactivated measles vaccine or an unknown type of measles vaccine during 1963-1967 should receive 2 doses of MMR vaccine. People who received inactivated mumps vaccine or an unknown type of mumps vaccine before 1979 and are at high risk for mumps infection should consider immunization with 2 doses of MMR vaccine. Unvaccinated health care workers born before [birth year] who lack laboratory evidence of measles, mumps, or rubella immunity or laboratory confirmation of disease should consider measles and mumps immunization with 2 doses of MMR vaccine or rubella immunization with 1 dose of MMR vaccine.  Pneumococcal  13-valent conjugate (PCV13) vaccine. When indicated, a person who is uncertain of his immunization history and has no record of immunization should receive the PCV13 vaccine. All adults 10 years of age and older should receive this vaccine. An adult aged 63 years or older who has certain medical conditions and has not been previously immunized should receive 1 dose of PCV13 vaccine. This PCV13 should be followed with a dose of pneumococcal polysaccharide (PPSV23) vaccine. Adults who are at high risk for pneumococcal disease should obtain the PPSV23 vaccine at least 8 weeks after the dose of PCV13 vaccine. Adults older than 62 years of age who have normal immune system function should obtain the PPSV23 vaccine dose at least 1 year after the dose of PCV13 vaccine.  Pneumococcal polysaccharide (PPSV23) vaccine. When PCV13 is also indicated, PCV13 should be obtained first. All adults aged 37 years and older should be immunized. An adult younger than age 8 years who has certain medical conditions should be immunized. Any person who resides in a nursing home or long-term care facility should be immunized. An adult smoker should be immunized. People with an immunocompromised condition and certain other conditions should receive both PCV13 and PPSV23 vaccines. People with human immunodeficiency virus (HIV) infection should be immunized as soon as possible after diagnosis. Immunization during chemotherapy or radiation therapy should be avoided. Routine use of PPSV23 vaccine is not recommended for American Indians, [place] Natives, or people younger than 65 years unless there are medical conditions that require PPSV23 vaccine. When indicated, people who have unknown immunization and have no record of immunization should receive PPSV23 vaccine. One-time revaccination 5 years after the first dose of PPSV23 is recommended for people aged 19-64 years who have chronic kidney failure, nephrotic syndrome, asplenia, or  immunocompromised conditions. People who received 1-2 doses of PPSV23 before age 57 years should receive another dose of PPSV23 vaccine at age 31 years or later if at least 5 years have passed since the previous dose. Doses of PPSV23 are not needed for people immunized with PPSV23 at or after age 31 years.  Meningococcal vaccine. Adults with asplenia or persistent complement component deficiencies should receive 2 doses of quadrivalent meningococcal conjugate (MenACWY-D) vaccine. The doses should be obtained at least 2 months  apart. Microbiologists working with certain meningococcal bacteria, [employer] recruits, people at risk during an outbreak, and people who travel to or live in countries with a high rate of meningitis should be immunized. A first-year college student up through age 68 years who is living in a residence hall should receive a dose if he did not receive a dose on or after his 16th birthday. Adults who have certain high-risk conditions should receive one or more doses of vaccine.  Hepatitis A vaccine. Adults who wish to be protected from this disease, have chronic liver disease, work with hepatitis A-infected animals, work in hepatitis A research labs, or travel to or work in countries with a high rate of hepatitis A should be immunized. Adults who were previously unvaccinated and who anticipate close contact with an international adoptee during the first 60 days after arrival in the [place] from a country with a high rate of hepatitis A should be immunized.  Hepatitis B vaccine. Adults should be immunized if they wish to be protected from this disease, are under age 62 years and have diabetes, have chronic liver disease, have had more than one sex partner in the past 6 months, may be exposed to blood or other infectious body fluids, are household contacts or sex partners of hepatitis B positive people, are clients or workers in certain care facilities, or travel to or work in countries  with a high rate of hepatitis B.  Haemophilus influenzae type b (Hib) vaccine. A previously unvaccinated person with asplenia or sickle cell disease or having a scheduled splenectomy should receive 1 dose of Hib vaccine. Regardless of previous immunization, a recipient of a hematopoietic stem cell transplant should receive a 3-dose series 6-12 months after his successful transplant. Hib vaccine is not recommended for adults with HIV infection. Preventive Service / Frequency Ages 108 to 15  Blood pressure check.** / Every 3-5 years.  Lipid and cholesterol check.** / Every 5 years beginning at age 86.  Hepatitis C blood test.** / For any individual with known risks for hepatitis C.  Skin self-exam. / Monthly.  Influenza vaccine. / Every year.  Tetanus, diphtheria, and acellular pertussis (Tdap, Td) vaccine.** / Consult your health care provider. 1 dose of Td every 10 years.  Varicella vaccine.** / Consult your health care provider.  HPV vaccine. / 3 doses over 6 months, if 32 or younger.  Measles, mumps, rubella (MMR) vaccine.** / You need at least 1 dose of MMR if you were born in 1957 or later. You may also need a second dose.  Pneumococcal 13-valent conjugate (PCV13) vaccine.** / Consult your health care provider.  Pneumococcal polysaccharide (PPSV23) vaccine.** / 1 to 2 doses if you smoke cigarettes or if you have certain conditions.  Meningococcal vaccine.** / 1 dose if you are age 30 to 71 years and a [employer] researcher living in a residence hall, or have one of several medical conditions. You may also need additional booster doses.  Hepatitis A vaccine.** / Consult your health care provider.  Hepatitis B vaccine.** / Consult your health care provider.  Haemophilus influenzae type b (Hib) vaccine.** / Consult your health care provider. Ages 25 to 3  Blood pressure check.** / Every year.  Lipid and cholesterol check.** / Every 5 years beginning at age 64.  Lung  cancer screening. / Every year if you are aged 95-80 years and have a 30-pack-year history of smoking and currently smoke or have quit within the past 15 years. Yearly screening  is stopped once you have quit smoking for at least 15 years or develop a health problem that would prevent you from having lung cancer treatment.  Fecal occult blood test (FOBT) of stool. / Every year beginning at age 55 and continuing until age 51. You may not have to do this test if you get a colonoscopy every 10 years.  Flexible sigmoidoscopy** or colonoscopy.** / Every 5 years for a flexible sigmoidoscopy or every 10 years for a colonoscopy beginning at age 77 and continuing until age 14.  Hepatitis C blood test.** / For all people born from [birth year] through 1965 and any individual with known risks for hepatitis C.  Skin self-exam. / Monthly.  Influenza vaccine. / Every year.  Tetanus, diphtheria, and acellular pertussis (Tdap/Td) vaccine.** / Consult your health care provider. 1 dose of Td every 10 years.  Varicella vaccine.** / Consult your health care provider.  Zoster vaccine.** / 1 dose for adults aged 73 years or older.  Measles, mumps, rubella (MMR) vaccine.** / You need at least 1 dose of MMR if you were born in 1959 or later. You may also need a second dose.  Pneumococcal 13-valent conjugate (PCV13) vaccine.** / Consult your health care provider.  Pneumococcal polysaccharide (PPSV23) vaccine.** / 1 to 2 doses if you smoke cigarettes or if you have certain conditions.  Meningococcal vaccine.** / Consult your health care provider.  Hepatitis A vaccine.** / Consult your health care provider.  Hepatitis B vaccine.** / Consult your health care provider.  Haemophilus influenzae type b (Hib) vaccine.** / Consult your health care provider. Ages 67 and over  Blood pressure check.** / Every year.  Lipid and cholesterol check.**/ Every 5 years beginning at age 17.  Lung cancer screening. / Every year if you  are aged 60-80 years and have a 30-pack-year history of smoking and currently smoke or have quit within the past 15 years. Yearly screening is stopped once you have quit smoking for at least 15 years or develop a health problem that would prevent you from having lung cancer treatment.  Fecal occult blood test (FOBT) of stool. / Every year beginning at age 69 and continuing until age 86. You may not have to do this test if you get a colonoscopy every 10 years.  Flexible sigmoidoscopy** or colonoscopy.** / Every 5 years for a flexible sigmoidoscopy or every 10 years for a colonoscopy beginning at age 68 and continuing until age 52.  Hepatitis C blood test.** / For all people born from [birth year] through 1967 and any individual with known risks for hepatitis C.  Abdominal aortic aneurysm (AAA) screening.** / A one-time screening for ages 19 to 20 years who are current or former smokers.  Skin self-exam. / Monthly.  Influenza vaccine. / Every year.  Tetanus, diphtheria, and acellular pertussis (Tdap/Td) vaccine.** / 1 dose of Td every 10 years.  Varicella vaccine.** / Consult your health care provider.  Zoster vaccine.** / 1 dose for adults aged 49 years or older.  Pneumococcal 13-valent conjugate (PCV13) vaccine.** / 1 dose for all adults aged 53 years and older.  Pneumococcal polysaccharide (PPSV23) vaccine.** / 1 dose for all adults aged 16 years and older.  Meningococcal vaccine.** / Consult your health care provider.  Hepatitis A vaccine.** / Consult your health care provider.  Hepatitis B vaccine.** / Consult your health care provider.  Haemophilus influenzae type b (Hib) vaccine.** / Consult your health care provider. **Family history and personal history of risk and conditions may change  your health care provider's recommendations.   This information is not intended to replace advice given to you by your health care provider. Make sure you discuss any questions you have with your  health care provider.   Document Released: 04/26/2001 Document Revised: 03/21/2014 Document Reviewed: 07/26/2010 Elsevier Interactive Patient Education [employer].

## 2015-04-07 NOTE — Progress Notes (Deleted)
Patient presents to clinic today c/o ***.   Past Medical History  Diagnosis Date  . Diabetes type 1, uncontrolled ([place])   . Frequent headaches   . Heart attack ([place]) 09.13.2014    x5  . Hyperlipidemia   . Chicken pox   . Mumps   . Measles, [place] (rubella)   . Scarlet fever   . Diabetic retinopathy ([place])   . Cataracts, bilateral   . Orthostatic hypotension   . CAD (coronary artery disease)   . Contrast dye induced nephropathy   . OSA (obstructive sleep apnea) 07/17/2013    Current Outpatient Prescriptions on File Prior to Visit  Medication Sig Dispense Refill  . acetaminophen (TYLENOL) 325 MG tablet Take 650 mg by mouth every 6 (six) hours as needed for mild pain.    Marland Kitchen aspirin 81 MG tablet Take 81 mg by mouth daily.    Marland Kitchen atorvastatin (LIPITOR) 40 MG tablet Take 1 tablet (40 mg total) by mouth daily. 90 tablet 3  . clopidogrel (PLAVIX) 75 MG tablet Take 1 tablet (75 mg total) by mouth daily. 90 tablet 3  . co-enzyme Q-10 30 MG capsule Take 30 mg by mouth daily.     Marland Kitchen gabapentin (NEURONTIN) 600 MG tablet Take 600 mg by mouth 2 (two) times daily.    Marland Kitchen glucose blood test strip 1 each by Other route as directed. Use as instructed FREESTYLE IN VITRO    . HYDROcodone-acetaminophen (NORCO/VICODIN) 5-325 MG tablet Take 1 tablet by mouth every 6 (six) hours as needed for severe pain. 60 tablet 0  . insulin lispro (HUMALOG) 100 UNIT/ML injection Inject 1-40 Units into the skin as directed. Use in Insulin Pump, Max daily dose: 100 units/day    . isosorbide mononitrate (IMDUR) 60 MG 24 hr tablet Take 1 tablet (60 mg total) by mouth daily. 30 tablet 11  . lisinopril (PRINIVIL,ZESTRIL) 10 MG tablet Take 10 mg by mouth daily.  11  . meclizine (ANTIVERT) 12.5 MG tablet Take 1 tablet (12.5 mg total) by mouth 3 (three) times daily as needed for dizziness. 30 tablet 0  . midodrine (PROAMATINE) 10 MG tablet TAKE 1 TABLET BY MOUTH TWICE DAILY 60 tablet 2  . SENEXON-S 8.6-50 MG tablet TAKE 1 TABLET BY  MOUTH DAILY. 100 tablet 1  . zolpidem (AMBIEN CR) 12.5 MG CR tablet Take 1 tablet (12.5 mg total) by mouth at bedtime. 30 tablet 0  . glucagon 1 MG injection Inject 1 mg into the vein once as needed (Emergency Test Kit). Reported on 04/07/2015    . nitroGLYCERIN (NITROSTAT) 0.4 MG SL tablet Place 1 tablet (0.4 mg total) under the tongue every 5 (five) minutes x 3 doses as needed for chest pain. (Patient not taking: Reported on 04/07/2015) 25 tablet 12   No current facility-administered medications on file prior to visit.    No Known Allergies  Family History  Problem Relation Age of Onset  . Vascular Disease Father 65    Deceased  . Transient ischemic attack Father   . Hypertension Mother 4    Deceased  . Liver cancer Paternal Grandfather   . Breast cancer Sister   . Heart disease Sister     Ablation for rhythm disturbance  . Healthy Brother     x1  . Other Mother     s/p PPM    Social History   Social History  . Marital Status: Married    Spouse Name: N/A  . Number of Children: 1  .  Years of Education: N/A   Social History Main Topics  . Smoking status: Never Smoker   . Smokeless tobacco: None  . Alcohol Use: Yes     Comment: Rare  . Drug Use: No  . Sexual Activity: Not Asked   Other Topics Concern  . 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  They have one grown daughter.   Highest level of education:  Master's Degree    Review of Systems - See HPI.  All other ROS are negative.  BP 118/70 mmHg  Pulse 70  Temp(Src) 98.1 F (36.7 C) (Oral)  Ht 5' 6"  (1.676 m)  Wt 184 lb (83.462 kg)  BMI 29.71 kg/m2  SpO2 97%  Physical Exam  Recent Results (from the past 2160 hour(s))  POC CBG, ED     Status: Abnormal   Collection Time: 03/17/15  5:15 PM  Result Value Ref Range   Glucose-Capillary 181 (H) 65 - 99 mg/dL  Comprehensive metabolic panel     Status: Abnormal   Collection Time: 03/17/15  7:30 PM    Result Value Ref Range   Sodium 138 135 - 145 mmol/L   Potassium 2.9 (L) 3.5 - 5.1 mmol/L   Chloride 101 101 - 111 mmol/L   CO2 28 22 - 32 mmol/L   Glucose, Bld 184 (H) 65 - 99 mg/dL   BUN 15 6 - 20 mg/dL   Creatinine, Ser 1.46 (H) 0.61 - 1.24 mg/dL   Calcium 8.3 (L) 8.9 - 10.3 mg/dL   Total Protein 7.0 6.5 - 8.1 g/dL   Albumin 3.7 3.5 - 5.0 g/dL   AST 30 15 - 41 U/L   ALT 17 17 - 63 U/L   Alkaline Phosphatase 110 38 - 126 U/L   Total Bilirubin 1.0 0.3 - 1.2 mg/dL   GFR calc non Af Amer 50 (L) >60 mL/min   GFR calc Af Amer 58 (L) >60 mL/min    Comment: (NOTE) The eGFR has been calculated using the CKD EPI equation. This calculation has not been validated in all clinical situations. eGFR's persistently <60 mL/min signify possible Chronic Kidney Disease.    Anion gap 9 5 - 15  Lipase, blood     Status: None   Collection Time: 03/17/15  7:30 PM  Result Value Ref Range   Lipase 14 11 - 51 U/L  Troponin I     Status: Abnormal   Collection Time: 03/17/15  7:30 PM  Result Value Ref Range   Troponin I 3.99 (HH) <0.031 ng/mL    Comment:        POSSIBLE MYOCARDIAL ISCHEMIA. SERIAL TESTING RECOMMENDED. CRITICAL RESULT CALLED TO, READ BACK BY AND VERIFIED WITH: B.BROOKS RN AT 2016 03/17/15 BY [email or web address]   CBC with Differential     Status: Abnormal   Collection Time: 03/17/15  7:30 PM  Result Value Ref Range   WBC 8.3 4.0 - 10.5 K/uL   RBC 4.43 4.22 - 5.81 MIL/uL   Hemoglobin 12.4 (L) 13.0 - 17.0 g/dL   HCT 38.0 (L) 39.0 - 52.0 %   MCV 85.8 78.0 - 100.0 fL   MCH 28.0 26.0 - 34.0 pg   MCHC 32.6 30.0 - 36.0 g/dL   RDW 12.6 11.5 - 15.5 %   Platelets 218 150 - 400 K/uL   Neutrophils Relative % 77 %   Neutro Abs 6.3 1.7 - 7.7 K/uL   Lymphocytes Relative  13 %   Lymphs Abs 1.1 0.7 - 4.0 K/uL   Monocytes Relative 10 %   Monocytes Absolute 0.8 0.1 - 1.0 K/uL   Eosinophils Relative 0 %   Eosinophils Absolute 0.0 0.0 - 0.7 K/uL   Basophils Relative 0 %   Basophils Absolute 0.0 0.0 -  0.1 K/uL  Brain natriuretic peptide     Status: Abnormal   Collection Time: 03/17/15  7:30 PM  Result Value Ref Range   B Natriuretic Peptide 451.1 (H) 0.0 - 100.0 pg/mL  Troponin I     Status: Abnormal   Collection Time: 03/18/15 12:55 AM  Result Value Ref Range   Troponin I 2.90 (HH) <0.031 ng/mL    Comment:        POSSIBLE MYOCARDIAL ISCHEMIA. SERIAL TESTING RECOMMENDED. CRITICAL RESULT CALLED TO, READ BACK BY AND VERIFIED WITH: BROOKS,B,RN @ 0142 03/18/15 BY GWYN,P   Brain natriuretic peptide     Status: Abnormal   Collection Time: 03/18/15 12:55 AM  Result Value Ref Range   B Natriuretic Peptide 411.7 (H) 0.0 - 100.0 pg/mL  Glucose, capillary     Status: Abnormal   Collection Time: 03/18/15  1:41 AM  Result Value Ref Range   Glucose-Capillary 382 (H) 65 - 99 mg/dL  MRSA PCR Screening     Status: Abnormal   Collection Time: 03/18/15  1:43 AM  Result Value Ref Range   MRSA by PCR POSITIVE (A) NEGATIVE    Comment:        The GeneXpert MRSA Assay (FDA approved for NASAL specimens only), is one component of a comprehensive MRSA colonization surveillance program. It is not intended to diagnose MRSA infection nor to guide or monitor treatment for MRSA infections. RESULT CALLED TO, READ BACK BY AND VERIFIED WITH: B BUCKNER@0524  03/18/15 MKELLY   Urinalysis, Routine w reflex microscopic     Status: Abnormal   Collection Time: 03/18/15  2:00 AM  Result Value Ref Range   Color, Urine YELLOW YELLOW   APPearance CLEAR CLEAR   Specific Gravity, Urine 1.014 1.005 - 1.030   pH 6.0 5.0 - 8.0   Glucose, UA 500 (A) NEGATIVE mg/dL   Hgb urine dipstick TRACE (A) NEGATIVE   Bilirubin Urine NEGATIVE NEGATIVE   Ketones, ur 15 (A) NEGATIVE mg/dL   Protein, ur 100 (A) NEGATIVE mg/dL   Nitrite NEGATIVE NEGATIVE   Leukocytes, UA NEGATIVE NEGATIVE  Urine microscopic-add on     Status: Abnormal   Collection Time: 03/18/15  2:00 AM  Result Value Ref Range   Squamous Epithelial / LPF 0-5  (A) NONE SEEN   WBC, UA 0-5 0 - 5 WBC/hpf   RBC / HPF 0-5 0 - 5 RBC/hpf   Bacteria, UA RARE (A) NONE SEEN   Casts HYALINE CASTS (A) NEGATIVE  Influenza panel by PCR (type A & B, H1N1)     Status: None   Collection Time: 03/18/15  5:25 AM  Result Value Ref Range   Influenza A By PCR NEGATIVE NEGATIVE   Influenza B By PCR NEGATIVE NEGATIVE   H1N1 flu by pcr NOT DETECTED NOT DETECTED    Comment:        The Xpert Flu assay (FDA approved for nasal aspirates or washes and nasopharyngeal swab specimens), is intended as an aid in the diagnosis of influenza and should not be used as a sole basis for treatment.   Protime-INR     Status: Abnormal   Collection Time: 03/18/15  7:09 AM  Result Value Ref Range   Prothrombin Time 15.8 (H) 11.6 - 15.2 seconds   INR 1.24 0.00 - 1.49  Glucose, capillary     Status: Abnormal   Collection Time: 03/18/15  8:15 AM  Result Value Ref Range   Glucose-Capillary 440 (H) 65 - 99 mg/dL  Basic metabolic panel     Status: Abnormal   Collection Time: 03/18/15 10:30 AM  Result Value Ref Range   Sodium 137 135 - 145 mmol/L   Potassium 3.1 (L) 3.5 - 5.1 mmol/L   Chloride 99 (L) 101 - 111 mmol/L   CO2 25 22 - 32 mmol/L   Glucose, Bld 418 (H) 65 - 99 mg/dL   BUN 17 6 - 20 mg/dL   Creatinine, Ser 1.58 (H) 0.61 - 1.24 mg/dL   Calcium 8.4 (L) 8.9 - 10.3 mg/dL   GFR calc non Af Amer 46 (L) >60 mL/min   GFR calc Af Amer 53 (L) >60 mL/min    Comment: (NOTE) The eGFR has been calculated using the CKD EPI equation. This calculation has not been validated in all clinical situations. eGFR's persistently <60 mL/min signify possible Chronic Kidney Disease.    Anion gap 13 5 - 15  CBC     Status: Abnormal   Collection Time: 03/18/15 10:30 AM  Result Value Ref Range   WBC 7.9 4.0 - 10.5 K/uL   RBC 4.36 4.22 - 5.81 MIL/uL   Hemoglobin 12.5 (L) 13.0 - 17.0 g/dL   HCT 36.9 (L) 39.0 - 52.0 %   MCV 84.6 78.0 - 100.0 fL   MCH 28.7 26.0 - 34.0 pg   MCHC 33.9 30.0 -  36.0 g/dL   RDW 13.0 11.5 - 15.5 %   Platelets 200 150 - 400 K/uL  Heparin level (unfractionated)     Status: None   Collection Time: 03/18/15 10:30 AM  Result Value Ref Range   Heparin Unfractionated 0.70 0.30 - 0.70 IU/mL    Comment:        IF HEPARIN RESULTS ARE BELOW EXPECTED VALUES, AND PATIENT DOSAGE HAS BEEN CONFIRMED, SUGGEST FOLLOW UP TESTING OF ANTITHROMBIN III LEVELS.   Lipid panel     Status: Abnormal   Collection Time: 03/18/15 10:30 AM  Result Value Ref Range   Cholesterol 113 0 - 200 mg/dL   Triglycerides 74 <150 mg/dL   HDL 32 (L) >40 mg/dL   Total CHOL/HDL Ratio 3.5 RATIO   VLDL 15 0 - 40 mg/dL   LDL Cholesterol 66 0 - 99 mg/dL    Comment:        Total Cholesterol/HDL:CHD Risk Coronary Heart Disease Risk Table                     Men   Women  1/2 Average Risk   3.4   3.3  Average Risk       5.0   4.4  2 X Average Risk   9.6   7.1  3 X Average Risk  23.4   11.0        Use the calculated Patient Ratio above and the CHD Risk Table to determine the patient's CHD Risk.        ATP III CLASSIFICATION (LDL):  <100     mg/dL   Optimal  100-129  mg/dL   Near or Above                    Optimal  130-159  mg/dL  Borderline  160-189  mg/dL   High  >190     mg/dL   Very High   Troponin I     Status: Abnormal   Collection Time: 03/18/15 10:30 AM  Result Value Ref Range   Troponin I 1.27 (HH) <0.031 ng/mL    Comment:        POSSIBLE MYOCARDIAL ISCHEMIA. SERIAL TESTING RECOMMENDED. CRITICAL RESULT CALLED TO, READ BACK BY AND VERIFIED WITH: FIELDS,D RN @ 6144 03/18/15 LEONARD,A   Glucose, capillary     Status: Abnormal   Collection Time: 03/18/15 11:40 AM  Result Value Ref Range   Glucose-Capillary 406 (H) 65 - 99 mg/dL   Comment 1 Notify RN    Comment 2 Document in Chart   Glucose, capillary     Status: Abnormal   Collection Time: 03/18/15  2:06 PM  Result Value Ref Range   Glucose-Capillary 224 (H) 65 - 99 mg/dL   Comment 1 Notify RN    Comment 2  Document in Chart   Glucose, capillary     Status: Abnormal   Collection Time: 03/18/15  4:20 PM  Result Value Ref Range   Glucose-Capillary 175 (H) 65 - 99 mg/dL  Glucose, capillary     Status: Abnormal   Collection Time: 03/18/15 10:17 PM  Result Value Ref Range   Glucose-Capillary 198 (H) 65 - 99 mg/dL  Basic metabolic panel     Status: Abnormal   Collection Time: 03/19/15  5:20 AM  Result Value Ref Range   Sodium 136 135 - 145 mmol/L   Potassium 3.8 3.5 - 5.1 mmol/L   Chloride 100 (L) 101 - 111 mmol/L   CO2 24 22 - 32 mmol/L   Glucose, Bld 301 (H) 65 - 99 mg/dL   BUN 18 6 - 20 mg/dL   Creatinine, Ser 1.24 0.61 - 1.24 mg/dL   Calcium 8.3 (L) 8.9 - 10.3 mg/dL   GFR calc non Af Amer >60 >60 mL/min   GFR calc Af Amer >60 >60 mL/min    Comment: (NOTE) The eGFR has been calculated using the CKD EPI equation. This calculation has not been validated in all clinical situations. eGFR's persistently <60 mL/min signify possible Chronic Kidney Disease.    Anion gap 12 5 - 15  CBC     Status: Abnormal   Collection Time: 03/19/15  5:20 AM  Result Value Ref Range   WBC 7.5 4.0 - 10.5 K/uL   RBC 4.07 (L) 4.22 - 5.81 MIL/uL   Hemoglobin 11.8 (L) 13.0 - 17.0 g/dL   HCT 35.0 (L) 39.0 - 52.0 %   MCV 86.0 78.0 - 100.0 fL   MCH 29.0 26.0 - 34.0 pg   MCHC 33.7 30.0 - 36.0 g/dL   RDW 13.1 11.5 - 15.5 %   Platelets 206 150 - 400 K/uL  Heparin level (unfractionated)     Status: Abnormal   Collection Time: 03/19/15  5:52 AM  Result Value Ref Range   Heparin Unfractionated 0.25 (L) 0.30 - 0.70 IU/mL    Comment:        IF HEPARIN RESULTS ARE BELOW EXPECTED VALUES, AND PATIENT DOSAGE HAS BEEN CONFIRMED, SUGGEST FOLLOW UP TESTING OF ANTITHROMBIN III LEVELS.   Glucose, capillary     Status: Abnormal   Collection Time: 03/19/15  7:49 AM  Result Value Ref Range   Glucose-Capillary 416 (H) 65 - 99 mg/dL  Brain natriuretic peptide     Status: Abnormal   Collection Time: 03/19/15 12:30 PM  Result Value Ref Range   B Natriuretic Peptide 377.5 (H) 0.0 - 100.0 pg/mL  Glucose, capillary     Status: Abnormal   Collection Time: 03/19/15 12:46 PM  Result Value Ref Range   Glucose-Capillary 249 (H) 65 - 99 mg/dL  Glucose, capillary     Status: Abnormal   Collection Time: 03/19/15  5:14 PM  Result Value Ref Range   Glucose-Capillary 175 (H) 65 - 99 mg/dL  Glucose, capillary     Status: Abnormal   Collection Time: 03/19/15  9:26 PM  Result Value Ref Range   Glucose-Capillary 134 (H) 65 - 99 mg/dL  Glucose, capillary     Status: Abnormal   Collection Time: 03/20/15 12:05 AM  Result Value Ref Range   Glucose-Capillary 211 (H) 65 - 99 mg/dL  Glucose, capillary     Status: Abnormal   Collection Time: 03/20/15  4:23 AM  Result Value Ref Range   Glucose-Capillary 141 (H) 65 - 99 mg/dL  Glucose, capillary     Status: Abnormal   Collection Time: 03/20/15  8:06 AM  Result Value Ref Range   Glucose-Capillary 109 (H) 65 - 99 mg/dL  Glucose, capillary     Status: Abnormal   Collection Time: 03/20/15 11:56 AM  Result Value Ref Range   Glucose-Capillary 219 (H) 65 - 99 mg/dL   Comment 1 Notify RN   Basic metabolic panel     Status: Abnormal   Collection Time: 03/20/15 12:30 PM  Result Value Ref Range   Sodium 137 135 - 145 mmol/L   Potassium 3.7 3.5 - 5.1 mmol/L   Chloride 100 (L) 101 - 111 mmol/L   CO2 25 22 - 32 mmol/L   Glucose, Bld 241 (H) 65 - 99 mg/dL   BUN 19 6 - 20 mg/dL   Creatinine, Ser 1.17 0.61 - 1.24 mg/dL   Calcium 8.8 (L) 8.9 - 10.3 mg/dL   GFR calc non Af Amer >60 >60 mL/min   GFR calc Af Amer >60 >60 mL/min    Comment: (NOTE) The eGFR has been calculated using the CKD EPI equation. This calculation has not been validated in all clinical situations. eGFR's persistently <60 mL/min signify possible Chronic Kidney Disease.    Anion gap 12 5 - 15  TSH     Status: None   Collection Time: 03/27/15 11:09 AM  Result Value Ref Range   TSH 2.434 0.350 - 4.500  uIU/mL    Assessment/Plan: No problem-specific assessment & plan notes found for this encounter.

## 2015-04-08 LAB — URINALYSIS, ROUTINE W REFLEX MICROSCOPIC
Bilirubin Urine: NEGATIVE
Ketones, ur: NEGATIVE
Leukocytes, UA: NEGATIVE
NITRITE: NEGATIVE
Specific Gravity, Urine: 1.015 (ref 1.000–1.030)
Total Protein, Urine: 100 — AB
URINE GLUCOSE: 500 — AB
Urobilinogen, UA: 0.2 (ref 0.0–1.0)
pH: 6 (ref 5.0–8.0)

## 2015-04-08 LAB — HEPATIC FUNCTION PANEL
ALK PHOS: 133 U/L — AB (ref 39–117)
ALT: 15 U/L (ref 0–53)
AST: 16 U/L (ref 0–37)
Albumin: 3.8 g/dL (ref 3.5–5.2)
BILIRUBIN DIRECT: 0.2 mg/dL (ref 0.0–0.3)
TOTAL PROTEIN: 6.5 g/dL (ref 6.0–8.3)
Total Bilirubin: 0.8 mg/dL (ref 0.2–1.2)

## 2015-04-08 LAB — VITAMIN D 25 HYDROXY (VIT D DEFICIENCY, FRACTURES): VITD: 38.93 ng/mL (ref 30.00–100.00)

## 2015-04-08 LAB — PSA, MEDICARE: PSA: 1.25 ng/ml (ref 0.10–4.00)

## 2015-04-10 ENCOUNTER — Telehealth: Payer: Self-pay | Admitting: Physician Assistant

## 2015-04-10 DIAGNOSIS — R8290 Unspecified abnormal findings in urine: Secondary | ICD-10-CM

## 2015-04-10 DIAGNOSIS — R945 Abnormal results of liver function studies: Secondary | ICD-10-CM

## 2015-04-10 DIAGNOSIS — R7989 Other specified abnormal findings of blood chemistry: Secondary | ICD-10-CM

## 2015-04-10 NOTE — Telephone Encounter (Signed)
Relation to ZO:XWRU Call back number:[phone]   Reason for call:  atient was advised to schedule lab appointment in 2 weeks due to abnormal labs. Patient scheduled for labs only 04/23/15. Requesting orders

## 2015-04-10 NOTE — Telephone Encounter (Signed)
Reviewed. Labs signed.

## 2015-04-10 NOTE — Telephone Encounter (Signed)
Labs entered and pended for PCP to review. DX attached for review as well.

## 2015-04-13 NOTE — Progress Notes (Signed)
Subjective:    Timothy Alvarez is a 60 y.o. male who presents for Medicare Annual/Subsequent preventive examination.   Preventive Screening-Counseling & Management  Tobacco History  Smoking status  . Never Smoker   Smokeless tobacco  . Not on file    Problems Prior to Visit 1. Coronary Artery Disease -- Followed by Cardiology. Recent NSTEMI. Is currently on Lipitor, Plavix, Imdur and lisinopril. Patient denies chest pain, palpitations, lightheadedness, dizziness, vision changes or frequent headaches.  2. Chronic Pain Syndrome -- Doing very well at present with pain medications only rarely used. Tries to stay active with low impact stretching and exercise  3. Orthostatic hypotension -- Continues midodrine and Florinef. Denies lightheadedness. BP stable  4. 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 Followed by Endocrinology. Denies acute concerns today  Current Problems (verified) Patient Active Problem List   Diagnosis Date Noted  . NSTEMI (non-ST elevated myocardial infarction) ([place]) 03/18/2015  . Severe comorbid illness   . Acute bacterial sinusitis 03/02/2015  . Eustachian tube dysfunction 03/01/2015  . SOB (shortness of breath) 08/12/2014  . Fatigue 02/03/2014  . OSA (obstructive sleep apnea) 07/17/2013  . Bruit 05/08/2013  . 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 Chronic pain syndrome 04/07/2013  . CAD S/P percutaneous coronary angioplasty 04/07/2013  . Neuropathy ([place]) 04/07/2013  . Orthostatic hypotension 04/07/2013  . Type 2 diabetes mellitus with renal manifestations ([place]) 04/07/2013  . Depression 04/07/2013  . Insomnia 04/07/2013  . Hyperlipidemia 04/07/2013  . Encounter to establish care 04/07/2013    Medications Prior to Visit Current Outpatient Prescriptions on File Prior to Visit  Medication Sig Dispense Refill  . acetaminophen (TYLENOL) 325 MG tablet Take 650 mg by mouth every 6 (six) hours as needed for mild pain.    Marland Kitchen aspirin 81 MG  tablet Take 81 mg by mouth daily.    Marland Kitchen atorvastatin (LIPITOR) 40 MG tablet Take 1 tablet (40 mg total) by mouth daily. 90 tablet 3  . clopidogrel (PLAVIX) 75 MG tablet Take 1 tablet (75 mg total) by mouth daily. 90 tablet 3  . co-enzyme Q-10 30 MG capsule Take 30 mg by mouth daily.     Marland Kitchen gabapentin (NEURONTIN) 600 MG tablet Take 600 mg by mouth 2 (two) times daily.    Marland Kitchen glucose blood test strip 1 each by Other route as directed. Use as instructed FREESTYLE IN VITRO    . HYDROcodone-acetaminophen (NORCO/VICODIN) 5-325 MG tablet Take 1 tablet by mouth every 6 (six) hours as needed for severe pain. 60 tablet 0  . insulin lispro (HUMALOG) 100 UNIT/ML injection Inject 1-40 Units into the skin as directed. Use in Insulin Pump, Max daily dose: 100 units/day    . isosorbide mononitrate (IMDUR) 60 MG 24 hr tablet Take 1 tablet (60 mg total) by mouth daily. 30 tablet 11  . lisinopril (PRINIVIL,ZESTRIL) 10 MG tablet Take 10 mg by mouth daily.  11  . meclizine (ANTIVERT) 12.5 MG tablet Take 1 tablet (12.5 mg total) by mouth 3 (three) times daily as needed for dizziness. 30 tablet 0  . midodrine (PROAMATINE) 10 MG tablet TAKE 1 TABLET BY MOUTH TWICE DAILY 60 tablet 2  . SENEXON-S 8.6-50 MG tablet TAKE 1 TABLET BY MOUTH DAILY. 100 tablet 1  . zolpidem (AMBIEN CR) 12.5 MG CR tablet Take 1 tablet (12.5 mg total) by mouth at bedtime. 30 tablet 0  . glucagon 1 MG injection Inject 1 mg into the vein once as needed (Emergency Test Kit). Reported  on 04/07/2015    . nitroGLYCERIN (NITROSTAT) 0.4 MG SL tablet Place 1 tablet (0.4 mg total) under the tongue every 5 (five) minutes x 3 doses as needed for chest pain. (Patient not taking: Reported on 04/07/2015) 25 tablet 12   No current facility-administered medications on file prior to visit.    Current Medications (verified) Current Outpatient Prescriptions  Medication Sig Dispense Refill  . acetaminophen (TYLENOL) 325 MG tablet Take 650 mg by mouth every 6 (six) hours  as needed for mild pain.    Marland Kitchen aspirin 81 MG tablet Take 81 mg by mouth daily.    Marland Kitchen atorvastatin (LIPITOR) 40 MG tablet Take 1 tablet (40 mg total) by mouth daily. 90 tablet 3  . clopidogrel (PLAVIX) 75 MG tablet Take 1 tablet (75 mg total) by mouth daily. 90 tablet 3  . co-enzyme Q-10 30 MG capsule Take 30 mg by mouth daily.     . fludrocortisone (FLORINEF) 0.1 MG tablet     . gabapentin (NEURONTIN) 600 MG tablet Take 600 mg by mouth 2 (two) times daily.    Marland Kitchen glucose blood test strip 1 each by Other route as directed. Use as instructed FREESTYLE IN VITRO    . HYDROcodone-acetaminophen (NORCO/VICODIN) 5-325 MG tablet Take 1 tablet by mouth every 6 (six) hours as needed for severe pain. 60 tablet 0  . insulin lispro (HUMALOG) 100 UNIT/ML injection Inject 1-40 Units into the skin as directed. Use in Insulin Pump, Max daily dose: 100 units/day    . isosorbide mononitrate (IMDUR) 60 MG 24 hr tablet Take 1 tablet (60 mg total) by mouth daily. 30 tablet 11  . lisinopril (PRINIVIL,ZESTRIL) 10 MG tablet Take 10 mg by mouth daily.  11  . meclizine (ANTIVERT) 12.5 MG tablet Take 1 tablet (12.5 mg total) by mouth 3 (three) times daily as needed for dizziness. 30 tablet 0  . midodrine (PROAMATINE) 10 MG tablet TAKE 1 TABLET BY MOUTH TWICE DAILY 60 tablet 2  . SENEXON-S 8.6-50 MG tablet TAKE 1 TABLET BY MOUTH DAILY. 100 tablet 1  . zolpidem (AMBIEN CR) 12.5 MG CR tablet Take 1 tablet (12.5 mg total) by mouth at bedtime. 30 tablet 0  . glucagon 1 MG injection Inject 1 mg into the vein once as needed (Emergency Test Kit). Reported on 04/07/2015    . nitroGLYCERIN (NITROSTAT) 0.4 MG SL tablet Place 1 tablet (0.4 mg total) under the tongue every 5 (five) minutes x 3 doses as needed for chest pain. (Patient not taking: Reported on 04/07/2015) 25 tablet 12   No current facility-administered medications for this visit.     Allergies (verified) Review of patient's allergies indicates no known allergies.   PAST  HISTORY  Family History Family History  Problem Relation Age of Onset  . Vascular Disease Father 71    Deceased  . Transient ischemic attack Father   . Hypertension Mother 33    Deceased  . Liver cancer Paternal Grandfather   . Breast cancer Sister   . Heart disease Sister     Ablation for rhythm disturbance  . Healthy Brother     x1  . Other Mother     s/p PPM    Social History Social History  Substance Use Topics  . Smoking status: Never Smoker   . Smokeless tobacco: Not on file  . Alcohol Use: Yes     Comment: Rare    Are there smokers in your home (other than you)?  No  Risk Factors  Current exercise habits: Home exercise routine includes walking 1 hrs per day.  Dietary issues discussed: Body mass index is 29.71 kg/(m^2). Endorses well-balanced diet.   Cardiac risk factors: advanced age (older than 31 for men, 42 for women), diabetes mellitus, dyslipidemia, hypertension, male gender and sedentary lifestyle.  Depression Screen (Note: if answer to either of the following is "Yes", a more complete depression screening is indicated)   Q1: Over the past two weeks, have you felt down, depressed or hopeless? No  Q2: Over the past two weeks, have you felt little interest or pleasure in doing things? No  Have you lost interest or pleasure in daily life? No  Do you often feel hopeless? No  Do you cry easily over simple problems? No  Activities of Daily Living In your present state of health, do you have any difficulty performing the following activities?:  Driving? No Managing money?  No Feeding yourself? No Getting from bed to chair? No Climbing a flight of stairs? No Preparing food and eating?: No Bathing or showering? No Getting dressed: No Getting to the toilet? No Using the toilet:No Moving around from place to place: Yes -- 2/2 neuropathy In the past year have you fallen or had a near fall?:No   Are you sexually active?  No  Do you have more than one  partner?  N/A  Hearing Difficulties: No Do you often ask people to speak up or repeat themselves? No Do you experience ringing or noises in your ears? No Do you have difficulty understanding soft or whispered voices? No   Do you feel that you have a problem with memory? No  Do you often misplace items? No  Do you feel safe at home?  Yes  Cognitive Testing  Alert? Yes  Normal Appearance?Yes  Oriented to person? Yes  Place? Yes   Time? Yes  Recall of three objects?  Yes  Can perform simple calculations? Yes  Displays appropriate judgment?Yes     Advanced Directives have been discussed with the patient? Yes   List the Names of Other Physician/Practitioners you currently use: 1.  See EMR for complete list  Indicate any recent Medical Services you may have received from other than [facility] providers in the past year (date may be approximate).  Immunization History  Administered Date(s) Administered  . Influenza,inj,Quad PF,36+ Mos 12/13/2013, 01/05/2015  . Pneumococcal Polysaccharide-23 01/31/2013  . Tdap 10/10/2013    Screening Tests Health Maintenance  Topic Date Due  . HIV Screening  09/24/1970  . ZOSTAVAX  09/24/2013  . HEMOGLOBIN A1C  06/19/2015  . OPHTHALMOLOGY EXAM  08/13/2015  . INFLUENZA VACCINE  10/13/2015  . FOOT EXAM  01/05/2016  . PNEUMOCOCCAL POLYSACCHARIDE VACCINE (2) 01/31/2018  . COLONOSCOPY  09/11/2021  . DTaP/Tdap/Td (2 - Td) 10/11/2023  . TETANUS/TDAP  10/11/2023  . Hepatitis C Screening  Completed    All answers were reviewed with the patient and necessary referrals were made:  Leeanne Rio, PA-C   04/13/2015   History reviewed: allergies, current medications, past family history, past medical history, past social history, past surgical history and problem list  Review of Systems Pertinent items noted in HPI and remainder of comprehensive ROS otherwise negative.    Objective:     Blood pressure 118/70, pulse 70, temperature 98.1 F  (36.7 C), temperature source Oral, height 5' 6"  (1.676 m), weight 184 lb (83.462 kg), SpO2 97 %. Body mass index is 29.71 kg/(m^2).  General appearance: alert, cooperative, appears stated age  and no distress Head: Normocephalic, without obvious abnormality, atraumatic Eyes: conjunctivae/corneas clear. PERRL, EOM's intact. Fundi benign. Ears: normal TM's and external ear canals both ears Nose: Nares normal. Septum midline. Mucosa normal. No drainage or sinus tenderness. Throat: lips, mucosa, and tongue normal; teeth and gums normal Lungs: clear to auscultation bilaterally Chest wall: no tenderness Heart: regular rate and rhythm, S1, S2 normal, no murmur, click, rub or gallop Skin: Skin color, texture, turgor normal. No rashes or lesions Lymph nodes: Cervical, supraclavicular, and axillary nodes normal. Neurologic: Alert and oriented X 3, normal strength and tone. Normal symmetric reflexes. Normal coordination and gait     Assessment:     (1) Medicare Wellness, Subsequent (2) Coronary Artery Disease (3) Chronic Pain Syndrome (4) Orthostatic Hypotension (5) DM II with complications, controlled (6) Prostate Cancer Screening   Plan:     (1) During the course of the visit the patient was educated and counseled about appropriate screening and preventive services including:    Influenza vaccine  Td vaccine  Prostate cancer screening  Colorectal cancer screening  Nutrition counseling   (2) Continue current regimen. Follow-up with Cardiology as scheduled. (3) Doing very well. Will continue the same regimen. Will check LFT today. (4) BP stable. Continue current regimen. (5) Followed by Endocrinology. Will continue to follow. (6) Will obtain PSA level today.  Patient Instructions (the written plan) was given to the patient.  Medicare Attestation I have personally reviewed: The patient's medical and social history Their use of alcohol, tobacco or illicit drugs Their current  medications and supplements The patient's functional ability including ADLs,fall risks, home safety risks, cognitive, and hearing and visual impairment Diet and physical activities Evidence for depression or mood disorders  The patient's weight, height, BMI, and visual acuity have been recorded in the chart.  I have made referrals, counseling, and provided education to the patient based on review of the above and I have provided the patient with a written personalized care plan for preventive services.     Raiford Noble [place], [place]   04/13/2015

## 2015-04-20 MED FILL — FREESTYLE TEST STRIPS: 59 days supply | Qty: 200 | Fill #3

## 2015-04-20 MED FILL — GABAPENTIN 600 MG TABLET: 600 | 30 days supply | Qty: 90 | Fill #1

## 2015-04-20 MED FILL — ISOSORBIDE MN ER 60 MG TAB: 60 | 30 days supply | Qty: 30 | Fill #1

## 2015-04-20 NOTE — Telephone Encounter (Signed)
Called to follow up with patient.  Pt states that for the past 1 week his "gut hurts."  Describes it as a constant mild burn that is not worsen by eating.  States pain not present at night whenever he sleeps nor early in the morning, however as he starts his normal routine, pain starts.  He denies chest pain/pressure, shortness of breath, abnormal BM, etc.  He also complains of frequent urination.  States he urinates about every 1 hour to 1 hour 30 minutes.  He denied changes in blood sugars, increase thirst, foul odor, changes in urine color, or abdominal swelling.    Advise:  Scheduled an appt with Malva Cogan, PA-C on Wednesday at 11:30am per patient's request.  Offered sooner appt, pt declined.  Pt advised if symptoms worsen or new symptoms develop to go to ER or UC.  Pt stated understanding and agreed.

## 2015-04-20 NOTE — Telephone Encounter (Signed)
Pt called stating he is in pain now. Aware of f/u lab appt 2/9. Pt requesting call back at [phone].

## 2015-04-20 NOTE — Telephone Encounter (Signed)
Please call to assess this.

## 2015-04-22 ENCOUNTER — Encounter: Payer: Self-pay | Admitting: Physician Assistant

## 2015-04-22 ENCOUNTER — Ambulatory Visit (INDEPENDENT_AMBULATORY_CARE_PROVIDER_SITE_OTHER): Payer: Medicare Other | Admitting: Physician Assistant

## 2015-04-22 VITALS — BP 119/55 | HR 75 | Temp 98.0°F | Ht 66.0 in | Wt 181.4 lb

## 2015-04-22 DIAGNOSIS — R350 Frequency of micturition: Secondary | ICD-10-CM

## 2015-04-22 DIAGNOSIS — R319 Hematuria, unspecified: Secondary | ICD-10-CM | POA: Diagnosis not present

## 2015-04-22 DIAGNOSIS — K219 Gastro-esophageal reflux disease without esophagitis: Secondary | ICD-10-CM | POA: Diagnosis not present

## 2015-04-22 DIAGNOSIS — R7989 Other specified abnormal findings of blood chemistry: Secondary | ICD-10-CM

## 2015-04-22 DIAGNOSIS — R945 Abnormal results of liver function studies: Secondary | ICD-10-CM

## 2015-04-22 DIAGNOSIS — R8290 Unspecified abnormal findings in urine: Secondary | ICD-10-CM

## 2015-04-22 DIAGNOSIS — R399 Unspecified symptoms and signs involving the genitourinary system: Secondary | ICD-10-CM | POA: Diagnosis not present

## 2015-04-22 LAB — POC URINALSYSI DIPSTICK (AUTOMATED)
Bilirubin, UA: NEGATIVE
GLUCOSE UA: NEGATIVE
Ketones, UA: NEGATIVE
NITRITE UA: NEGATIVE
Spec Grav, UA: 1.01
UROBILINOGEN UA: 0.2
pH, UA: 6.5

## 2015-04-22 LAB — CBC
HEMATOCRIT: 35.8 % — AB (ref 39.0–52.0)
HEMOGLOBIN: 11.8 g/dL — AB (ref 13.0–17.0)
MCHC: 33.1 g/dL (ref 30.0–36.0)
MCV: 84.7 fl (ref 78.0–100.0)
Platelets: 309 10*3/uL (ref 150.0–400.0)
RBC: 4.23 Mil/uL (ref 4.22–5.81)
RDW: 14.5 % (ref 11.5–15.5)
WBC: 6.1 10*3/uL (ref 4.0–10.5)

## 2015-04-22 LAB — LIPASE: LIPASE: 15 U/L (ref 11.0–59.0)

## 2015-04-22 MED ORDER — ZOLPIDEM TARTRATE ER 12.5 MG PO TBCR: 12.5000 mg | EXTENDED_RELEASE_TABLET | Freq: Every day | ORAL | Status: DC

## 2015-04-22 MED ORDER — HYDROCODONE-ACETAMINOPHEN 5-325 MG PO TABS: 1.0000 | ORAL_TABLET | Freq: Four times a day (QID) | ORAL | Status: DC | PRN

## 2015-04-22 MED ORDER — RANITIDINE HCL 150 MG PO CAPS: 150.0000 mg | ORAL_CAPSULE | Freq: Two times a day (BID) | ORAL | Status: DC

## 2015-04-22 MED ORDER — CIPROFLOXACIN HCL 500 MG PO TABS: 500.0000 mg | ORAL_TABLET | Freq: Two times a day (BID) | ORAL | Status: DC

## 2015-04-22 MED FILL — ZOLPIDEM TART ER 12.5 MG TA: 12.5 | 30 days supply | Qty: 30 | Fill #0

## 2015-04-22 MED FILL — CIPROFLOXACIN HCL 500 MG TA: 500 | 7 days supply | Qty: 14 | Fill #0

## 2015-04-22 MED FILL — HYDROCODON-APAP 5-325: 5-325 | 15 days supply | Qty: 60 | Fill #0

## 2015-04-22 MED FILL — raNITIdine HCL 150 MG TABS: 150 | 30 days supply | Qty: 60 | Fill #0

## 2015-04-22 NOTE — Progress Notes (Signed)
Patient presents to clinic today c/o 2.5 weeks of urinary urgency, frequency and dysuria. Denies fever, chills, nausea or vomiting. Denies penile pain. Is not sexually active.   Patient endorses 2.5 weeks of epigastric burning without pain. Denies nausea, vomiting or change to bowels. Denies melena or hematochezia. Denies change to diet. Denies alcohol or NSAID use. In on Plavix due to cardiovascular history.  Past Medical History  Diagnosis Date  . Diabetes type 1, uncontrolled ([place])   . Frequent headaches   . Heart attack ([place]) 09.13.2014    x5  . Hyperlipidemia   . Chicken pox   . Mumps   . Measles, [place] (rubella)   . Scarlet fever   . Diabetic retinopathy ([place])   . Cataracts, bilateral   . Orthostatic hypotension   . CAD (coronary artery disease)   . Contrast dye induced nephropathy   . OSA (obstructive sleep apnea) 07/17/2013    Current Outpatient Prescriptions on File Prior to Visit  Medication Sig Dispense Refill  . acetaminophen (TYLENOL) 325 MG tablet Take 650 mg by mouth every 6 (six) hours as needed for mild pain.    Marland Kitchen aspirin 81 MG tablet Take 81 mg by mouth daily.    Marland Kitchen atorvastatin (LIPITOR) 40 MG tablet Take 1 tablet (40 mg total) by mouth daily. 90 tablet 3  . clopidogrel (PLAVIX) 75 MG tablet Take 1 tablet (75 mg total) by mouth daily. 90 tablet 3  . co-enzyme Q-10 30 MG capsule Take 30 mg by mouth daily.     . fludrocortisone (FLORINEF) 0.1 MG tablet     . gabapentin (NEURONTIN) 600 MG tablet Take 600 mg by mouth 2 (two) times daily.    Marland Kitchen glucagon 1 MG injection Inject 1 mg into the vein once as needed (Emergency Test Kit). Reported on 04/07/2015    . glucose blood test strip 1 each by Other route as directed. Use as instructed FREESTYLE IN VITRO    . HYDROcodone-acetaminophen (NORCO/VICODIN) 5-325 MG tablet Take 1 tablet by mouth every 6 (six) hours as needed for severe pain. 60 tablet 0  . insulin lispro (HUMALOG) 100 UNIT/ML injection Inject 1-40 Units into  the skin as directed. Use in Insulin Pump, Max daily dose: 100 units/day    . isosorbide mononitrate (IMDUR) 60 MG 24 hr tablet Take 1 tablet (60 mg total) by mouth daily. 30 tablet 11  . lisinopril (PRINIVIL,ZESTRIL) 10 MG tablet Take 10 mg by mouth daily.  11  . meclizine (ANTIVERT) 12.5 MG tablet Take 1 tablet (12.5 mg total) by mouth 3 (three) times daily as needed for dizziness. 30 tablet 0  . midodrine (PROAMATINE) 10 MG tablet TAKE 1 TABLET BY MOUTH TWICE DAILY 60 tablet 2  . nitroGLYCERIN (NITROSTAT) 0.4 MG SL tablet Place 1 tablet (0.4 mg total) under the tongue every 5 (five) minutes x 3 doses as needed for chest pain. 25 tablet 12  . SENEXON-S 8.6-50 MG tablet TAKE 1 TABLET BY MOUTH DAILY. 100 tablet 1   No current facility-administered medications on file prior to visit.    No Known Allergies  Family History  Problem Relation Age of Onset  . Vascular Disease Father 64    Deceased  . Transient ischemic attack Father   . Hypertension Mother 20    Deceased  . Liver cancer Paternal Grandfather   . Breast cancer Sister   . Heart disease Sister     Ablation for rhythm disturbance  . Healthy Brother  x1  . Other Mother     s/p PPM    Social History   Social History  . Marital Status: Married    Spouse Name: N/A  . Number of Children: 1  . Years of Education: N/A   Social History Main Topics  . Smoking status: Never Smoker   . Smokeless tobacco: None  . Alcohol Use: Yes     Comment: Rare  . Drug Use: No  . Sexual Activity: Not Asked   Other Topics Concern  . 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  They have one grown daughter.   Highest level of education:  Master's Degree   Review of Systems - See HPI.  All other ROS are negative.  BP 119/55 mmHg  Pulse 75  Temp(Src) 98 F (36.7 C) (Oral)  Ht 5' 6"  (1.676 m)  Wt 181 lb 6.4 oz (82.283 kg)  BMI 29.29 kg/m2  SpO2 99%  Physical Exam    Constitutional: He is oriented to person, place, and time and well-developed, well-nourished, and in no distress.  HENT:  Head: Normocephalic and atraumatic.  Eyes: Conjunctivae are normal.  Cardiovascular: Normal rate, regular rhythm, normal heart sounds and intact distal pulses.   Pulmonary/Chest: Effort normal and breath sounds normal. No respiratory distress. He has no wheezes. He has no rales. He exhibits no tenderness.  Abdominal: Soft. Bowel sounds are normal. He exhibits no distension and no mass. There is no tenderness. There is no rebound and no guarding.  Neurological: He is alert and oriented to person, place, and time.  Skin: Skin is warm and dry. No rash noted.  Psychiatric: Affect normal.  Vitals reviewed.   Recent Results (from the past 2160 hour(s))  POC CBG, ED     Status: Abnormal   Collection Time: 03/17/15  5:15 PM  Result Value Ref Range   Glucose-Capillary 181 (H) 65 - 99 mg/dL  Comprehensive metabolic panel     Status: Abnormal   Collection Time: 03/17/15  7:30 PM  Result Value Ref Range   Sodium 138 135 - 145 mmol/L   Potassium 2.9 (L) 3.5 - 5.1 mmol/L   Chloride 101 101 - 111 mmol/L   CO2 28 22 - 32 mmol/L   Glucose, Bld 184 (H) 65 - 99 mg/dL   BUN 15 6 - 20 mg/dL   Creatinine, Ser 1.46 (H) 0.61 - 1.24 mg/dL   Calcium 8.3 (L) 8.9 - 10.3 mg/dL   Total Protein 7.0 6.5 - 8.1 g/dL   Albumin 3.7 3.5 - 5.0 g/dL   AST 30 15 - 41 U/L   ALT 17 17 - 63 U/L   Alkaline Phosphatase 110 38 - 126 U/L   Total Bilirubin 1.0 0.3 - 1.2 mg/dL   GFR calc non Af Amer 50 (L) >60 mL/min   GFR calc Af Amer 58 (L) >60 mL/min    Comment: (NOTE) The eGFR has been calculated using the CKD EPI equation. This calculation has not been validated in all clinical situations. eGFR's persistently <60 mL/min signify possible Chronic Kidney Disease.    Anion gap 9 5 - 15  Lipase, blood     Status: None   Collection Time: 03/17/15  7:30 PM  Result Value Ref Range   Lipase 14 11 -  51 U/L  Troponin I     Status: Abnormal   Collection Time: 03/17/15  7:30 PM  Result Value  Ref Range   Troponin I 3.99 (HH) <0.031 ng/mL    Comment:        POSSIBLE MYOCARDIAL ISCHEMIA. SERIAL TESTING RECOMMENDED. CRITICAL RESULT CALLED TO, READ BACK BY AND VERIFIED WITH: B.BROOKS RN AT 2016 03/17/15 BY [email or web address]   CBC with Differential     Status: Abnormal   Collection Time: 03/17/15  7:30 PM  Result Value Ref Range   WBC 8.3 4.0 - 10.5 K/uL   RBC 4.43 4.22 - 5.81 MIL/uL   Hemoglobin 12.4 (L) 13.0 - 17.0 g/dL   HCT 38.0 (L) 39.0 - 52.0 %   MCV 85.8 78.0 - 100.0 fL   MCH 28.0 26.0 - 34.0 pg   MCHC 32.6 30.0 - 36.0 g/dL   RDW 12.6 11.5 - 15.5 %   Platelets 218 150 - 400 K/uL   Neutrophils Relative % 77 %   Neutro Abs 6.3 1.7 - 7.7 K/uL   Lymphocytes Relative 13 %   Lymphs Abs 1.1 0.7 - 4.0 K/uL   Monocytes Relative 10 %   Monocytes Absolute 0.8 0.1 - 1.0 K/uL   Eosinophils Relative 0 %   Eosinophils Absolute 0.0 0.0 - 0.7 K/uL   Basophils Relative 0 %   Basophils Absolute 0.0 0.0 - 0.1 K/uL  Brain natriuretic peptide     Status: Abnormal   Collection Time: 03/17/15  7:30 PM  Result Value Ref Range   B Natriuretic Peptide 451.1 (H) 0.0 - 100.0 pg/mL  Troponin I     Status: Abnormal   Collection Time: 03/18/15 12:55 AM  Result Value Ref Range   Troponin I 2.90 (HH) <0.031 ng/mL    Comment:        POSSIBLE MYOCARDIAL ISCHEMIA. SERIAL TESTING RECOMMENDED. CRITICAL RESULT CALLED TO, READ BACK BY AND VERIFIED WITH: BROOKS,B,RN @ 0142 03/18/15 BY GWYN,P   Brain natriuretic peptide     Status: Abnormal   Collection Time: 03/18/15 12:55 AM  Result Value Ref Range   B Natriuretic Peptide 411.7 (H) 0.0 - 100.0 pg/mL  Glucose, capillary     Status: Abnormal   Collection Time: 03/18/15  1:41 AM  Result Value Ref Range   Glucose-Capillary 382 (H) 65 - 99 mg/dL  MRSA PCR Screening     Status: Abnormal   Collection Time: 03/18/15  1:43 AM  Result Value Ref Range   MRSA by PCR  POSITIVE (A) NEGATIVE    Comment:        The GeneXpert MRSA Assay (FDA approved for NASAL specimens only), is one component of a comprehensive MRSA colonization surveillance program. It is not intended to diagnose MRSA infection nor to guide or monitor treatment for MRSA infections. RESULT CALLED TO, READ BACK BY AND VERIFIED WITH: B BUCKNER'@0524'$  03/18/15 MKELLY   Urinalysis, Routine w reflex microscopic     Status: Abnormal   Collection Time: 03/18/15  2:00 AM  Result Value Ref Range   Color, Urine YELLOW YELLOW   APPearance CLEAR CLEAR   Specific Gravity, Urine 1.014 1.005 - 1.030   pH 6.0 5.0 - 8.0   Glucose, UA 500 (A) NEGATIVE mg/dL   Hgb urine dipstick TRACE (A) NEGATIVE   Bilirubin Urine NEGATIVE NEGATIVE   Ketones, ur 15 (A) NEGATIVE mg/dL   Protein, ur 100 (A) NEGATIVE mg/dL   Nitrite NEGATIVE NEGATIVE   Leukocytes, UA NEGATIVE NEGATIVE  Urine microscopic-add on     Status: Abnormal   Collection Time: 03/18/15  2:00 AM  Result Value Ref Range   Squamous  Epithelial / LPF 0-5 (A) NONE SEEN   WBC, UA 0-5 0 - 5 WBC/hpf   RBC / HPF 0-5 0 - 5 RBC/hpf   Bacteria, UA RARE (A) NONE SEEN   Casts HYALINE CASTS (A) NEGATIVE  Influenza panel by PCR (type A & B, H1N1)     Status: None   Collection Time: 03/18/15  5:25 AM  Result Value Ref Range   Influenza A By PCR NEGATIVE NEGATIVE   Influenza B By PCR NEGATIVE NEGATIVE   H1N1 flu by pcr NOT DETECTED NOT DETECTED    Comment:        The Xpert Flu assay (FDA approved for nasal aspirates or washes and nasopharyngeal swab specimens), is intended as an aid in the diagnosis of influenza and should not be used as a sole basis for treatment.   Protime-INR     Status: Abnormal   Collection Time: 03/18/15  7:09 AM  Result Value Ref Range   Prothrombin Time 15.8 (H) 11.6 - 15.2 seconds   INR 1.24 0.00 - 1.49  Glucose, capillary     Status: Abnormal   Collection Time: 03/18/15  8:15 AM  Result Value Ref Range    Glucose-Capillary 440 (H) 65 - 99 mg/dL  Basic metabolic panel     Status: Abnormal   Collection Time: 03/18/15 10:30 AM  Result Value Ref Range   Sodium 137 135 - 145 mmol/L   Potassium 3.1 (L) 3.5 - 5.1 mmol/L   Chloride 99 (L) 101 - 111 mmol/L   CO2 25 22 - 32 mmol/L   Glucose, Bld 418 (H) 65 - 99 mg/dL   BUN 17 6 - 20 mg/dL   Creatinine, Ser 1.58 (H) 0.61 - 1.24 mg/dL   Calcium 8.4 (L) 8.9 - 10.3 mg/dL   GFR calc non Af Amer 46 (L) >60 mL/min   GFR calc Af Amer 53 (L) >60 mL/min    Comment: (NOTE) The eGFR has been calculated using the CKD EPI equation. This calculation has not been validated in all clinical situations. eGFR's persistently <60 mL/min signify possible Chronic Kidney Disease.    Anion gap 13 5 - 15  CBC     Status: Abnormal   Collection Time: 03/18/15 10:30 AM  Result Value Ref Range   WBC 7.9 4.0 - 10.5 K/uL   RBC 4.36 4.22 - 5.81 MIL/uL   Hemoglobin 12.5 (L) 13.0 - 17.0 g/dL   HCT 36.9 (L) 39.0 - 52.0 %   MCV 84.6 78.0 - 100.0 fL   MCH 28.7 26.0 - 34.0 pg   MCHC 33.9 30.0 - 36.0 g/dL   RDW 13.0 11.5 - 15.5 %   Platelets 200 150 - 400 K/uL  Heparin level (unfractionated)     Status: None   Collection Time: 03/18/15 10:30 AM  Result Value Ref Range   Heparin Unfractionated 0.70 0.30 - 0.70 IU/mL    Comment:        IF HEPARIN RESULTS ARE BELOW EXPECTED VALUES, AND PATIENT DOSAGE HAS BEEN CONFIRMED, SUGGEST FOLLOW UP TESTING OF ANTITHROMBIN III LEVELS.   Lipid panel     Status: Abnormal   Collection Time: 03/18/15 10:30 AM  Result Value Ref Range   Cholesterol 113 0 - 200 mg/dL   Triglycerides 74 <150 mg/dL   HDL 32 (L) >40 mg/dL   Total CHOL/HDL Ratio 3.5 RATIO   VLDL 15 0 - 40 mg/dL   LDL Cholesterol 66 0 - 99 mg/dL    Comment:  Total Cholesterol/HDL:CHD Risk Coronary Heart Disease Risk Table                     Men   Women  1/2 Average Risk   3.4   3.3  Average Risk       5.0   4.4  2 X Average Risk   9.6   7.1  3 X Average Risk   23.4   11.0        Use the calculated Patient Ratio above and the CHD Risk Table to determine the patient's CHD Risk.        ATP III CLASSIFICATION (LDL):  <100     mg/dL   Optimal  100-129  mg/dL   Near or Above                    Optimal  130-159  mg/dL   Borderline  160-189  mg/dL   High  >190     mg/dL   Very High   Troponin I     Status: Abnormal   Collection Time: 03/18/15 10:30 AM  Result Value Ref Range   Troponin I 1.27 (HH) <0.031 ng/mL    Comment:        POSSIBLE MYOCARDIAL ISCHEMIA. SERIAL TESTING RECOMMENDED. CRITICAL RESULT CALLED TO, READ BACK BY AND VERIFIED WITH: FIELDS,D RN @ 9371 03/18/15 LEONARD,A   Glucose, capillary     Status: Abnormal   Collection Time: 03/18/15 11:40 AM  Result Value Ref Range   Glucose-Capillary 406 (H) 65 - 99 mg/dL   Comment 1 Notify RN    Comment 2 Document in Chart   Glucose, capillary     Status: Abnormal   Collection Time: 03/18/15  2:06 PM  Result Value Ref Range   Glucose-Capillary 224 (H) 65 - 99 mg/dL   Comment 1 Notify RN    Comment 2 Document in Chart   Glucose, capillary     Status: Abnormal   Collection Time: 03/18/15  4:20 PM  Result Value Ref Range   Glucose-Capillary 175 (H) 65 - 99 mg/dL  Glucose, capillary     Status: Abnormal   Collection Time: 03/18/15 10:17 PM  Result Value Ref Range   Glucose-Capillary 198 (H) 65 - 99 mg/dL  Basic metabolic panel     Status: Abnormal   Collection Time: 03/19/15  5:20 AM  Result Value Ref Range   Sodium 136 135 - 145 mmol/L   Potassium 3.8 3.5 - 5.1 mmol/L   Chloride 100 (L) 101 - 111 mmol/L   CO2 24 22 - 32 mmol/L   Glucose, Bld 301 (H) 65 - 99 mg/dL   BUN 18 6 - 20 mg/dL   Creatinine, Ser 1.24 0.61 - 1.24 mg/dL   Calcium 8.3 (L) 8.9 - 10.3 mg/dL   GFR calc non Af Amer >60 >60 mL/min   GFR calc Af Amer >60 >60 mL/min    Comment: (NOTE) The eGFR has been calculated using the CKD EPI equation. This calculation has not been validated in all clinical  situations. eGFR's persistently <60 mL/min signify possible Chronic Kidney Disease.    Anion gap 12 5 - 15  CBC     Status: Abnormal   Collection Time: 03/19/15  5:20 AM  Result Value Ref Range   WBC 7.5 4.0 - 10.5 K/uL   RBC 4.07 (L) 4.22 - 5.81 MIL/uL   Hemoglobin 11.8 (L) 13.0 - 17.0 g/dL   HCT 35.0 (L) 39.0 -  52.0 %   MCV 86.0 78.0 - 100.0 fL   MCH 29.0 26.0 - 34.0 pg   MCHC 33.7 30.0 - 36.0 g/dL   RDW 13.1 11.5 - 15.5 %   Platelets 206 150 - 400 K/uL  Heparin level (unfractionated)     Status: Abnormal   Collection Time: 03/19/15  5:52 AM  Result Value Ref Range   Heparin Unfractionated 0.25 (L) 0.30 - 0.70 IU/mL    Comment:        IF HEPARIN RESULTS ARE BELOW EXPECTED VALUES, AND PATIENT DOSAGE HAS BEEN CONFIRMED, SUGGEST FOLLOW UP TESTING OF ANTITHROMBIN III LEVELS.   Glucose, capillary     Status: Abnormal   Collection Time: 03/19/15  7:49 AM  Result Value Ref Range   Glucose-Capillary 416 (H) 65 - 99 mg/dL  Brain natriuretic peptide     Status: Abnormal   Collection Time: 03/19/15 12:30 PM  Result Value Ref Range   B Natriuretic Peptide 377.5 (H) 0.0 - 100.0 pg/mL  Glucose, capillary     Status: Abnormal   Collection Time: 03/19/15 12:46 PM  Result Value Ref Range   Glucose-Capillary 249 (H) 65 - 99 mg/dL  Glucose, capillary     Status: Abnormal   Collection Time: 03/19/15  5:14 PM  Result Value Ref Range   Glucose-Capillary 175 (H) 65 - 99 mg/dL  Glucose, capillary     Status: Abnormal   Collection Time: 03/19/15  9:26 PM  Result Value Ref Range   Glucose-Capillary 134 (H) 65 - 99 mg/dL  Glucose, capillary     Status: Abnormal   Collection Time: 03/20/15 12:05 AM  Result Value Ref Range   Glucose-Capillary 211 (H) 65 - 99 mg/dL  Glucose, capillary     Status: Abnormal   Collection Time: 03/20/15  4:23 AM  Result Value Ref Range   Glucose-Capillary 141 (H) 65 - 99 mg/dL  Glucose, capillary     Status: Abnormal   Collection Time: 03/20/15  8:06 AM   Result Value Ref Range   Glucose-Capillary 109 (H) 65 - 99 mg/dL  Glucose, capillary     Status: Abnormal   Collection Time: 03/20/15 11:56 AM  Result Value Ref Range   Glucose-Capillary 219 (H) 65 - 99 mg/dL   Comment 1 Notify RN   Basic metabolic panel     Status: Abnormal   Collection Time: 03/20/15 12:30 PM  Result Value Ref Range   Sodium 137 135 - 145 mmol/L   Potassium 3.7 3.5 - 5.1 mmol/L   Chloride 100 (L) 101 - 111 mmol/L   CO2 25 22 - 32 mmol/L   Glucose, Bld 241 (H) 65 - 99 mg/dL   BUN 19 6 - 20 mg/dL   Creatinine, Ser 1.17 0.61 - 1.24 mg/dL   Calcium 8.8 (L) 8.9 - 10.3 mg/dL   GFR calc non Af Amer >60 >60 mL/min   GFR calc Af Amer >60 >60 mL/min    Comment: (NOTE) The eGFR has been calculated using the CKD EPI equation. This calculation has not been validated in all clinical situations. eGFR's persistently <60 mL/min signify possible Chronic Kidney Disease.    Anion gap 12 5 - 15  TSH     Status: None   Collection Time: 03/27/15 11:09 AM  Result Value Ref Range   TSH 2.434 0.350 - 4.500 uIU/mL  Hepatic function panel     Status: Abnormal   Collection Time: 04/07/15  2:18 PM  Result Value Ref Range  Total Bilirubin 0.8 0.2 - 1.2 mg/dL   Bilirubin, Direct 0.2 0.0 - 0.3 mg/dL   Alkaline Phosphatase 133 (H) 39 - 117 U/L   AST 16 0 - 37 U/L   ALT 15 0 - 53 U/L   Total Protein 6.5 6.0 - 8.3 g/dL   Albumin 3.8 3.5 - 5.2 g/dL  Vitamin D (25 hydroxy)     Status: None   Collection Time: 04/07/15  2:18 PM  Result Value Ref Range   VITD 38.93 30.00 - 100.00 ng/mL  PSA, Medicare     Status: None   Collection Time: 04/07/15  2:18 PM  Result Value Ref Range   PSA 1.25 0.10 - 4.00 ng/ml  Urinalysis, Routine w reflex microscopic     Status: Abnormal   Collection Time: 04/07/15  2:18 PM  Result Value Ref Range   Color, Urine YELLOW Yellow;Lt. Yellow   APPearance CLEAR Clear   Specific Gravity, Urine 1.015 1.000-1.030   pH 6.0 5.0 - 8.0   Total Protein, Urine 100  (A) Negative   Urine Glucose 500 (A) Negative   Ketones, ur NEGATIVE Negative   Bilirubin Urine NEGATIVE Negative   Hgb urine dipstick MODERATE (A) Negative   Urobilinogen, UA 0.2 0.0 - 1.0   Leukocytes, UA NEGATIVE Negative   Nitrite NEGATIVE Negative   WBC, UA 0-2/hpf 0-2/hpf   RBC / HPF 3-6/hpf (A) 0-2/hpf   Squamous Epithelial / LPF Rare(0-4/hpf) Rare(0-4/hpf)  POCT Urinalysis Dipstick (Automated)     Status: Abnormal   Collection Time: 04/22/15 11:30 AM  Result Value Ref Range   Color, UA light-yellow    Clarity, UA cloudy    Glucose, UA neg    Bilirubin, UA neg    Ketones, UA neg    Spec Grav, UA 1.010    Blood, UA 3+    pH, UA 6.5    Protein, UA 1+    Urobilinogen, UA 0.2    Nitrite, UA neg    Leukocytes, UA small (1+) (A) Negative   Assessment/Plan: Gastroesophageal reflux disease without esophagitis Will begin Ranitidine 150 mg BID. Trying to avoid PPI for now due to interaction with Plavix. GERD diet reviewed. Will check labs today. Follow-up 1 week.  UTI symptoms Urine dip + blood and LE. Will send for culture. Will begin cipro as directed. Supportive measures discussed. Follow-up 1 week.

## 2015-04-22 NOTE — Assessment & Plan Note (Signed)
Will begin Ranitidine 150 mg BID. Trying to avoid PPI for now due to interaction with Plavix. GERD diet reviewed. Will check labs today. Follow-up 1 week.

## 2015-04-22 NOTE — Addendum Note (Signed)
Addended by: Marcelline Mates on: 04/22/2015 12:03 PM   Modules accepted: Orders, SmartSet

## 2015-04-22 NOTE — Assessment & Plan Note (Signed)
Urine dip + blood and LE. Will send for culture. Will begin cipro as directed. Supportive measures discussed. Follow-up 1 week.

## 2015-04-22 NOTE — Patient Instructions (Signed)
Please go to the lab for blood work. I will call you with your results. Please start the Zantac twice daily -- limit spicy or fried foods. No late-night eating.  Your symptoms are consistent with a bladder infection, also called acute cystitis. Please take your antibiotic (Cipro) as directed until all pills are gone.  Stay very well hydrated.  Consider a daily probiotic (Align, Culturelle, or Activia) to help prevent stomach upset caused by the antibiotic.  Taking a probiotic daily may also help prevent recurrent UTIs.  Also consider taking AZO (Phenazopyridine) tablets to help decrease pain with urination.  I will call you with your urine testing results.  We will change antibiotics if indicated.    Follow-up 1 week Urinary Tract Infection A urinary tract infection (UTI) can occur any place along the urinary tract. The tract includes the kidneys, ureters, bladder, and urethra. A type of germ called bacteria often causes a UTI. UTIs are often helped with antibiotic medicine.  HOME CARE   If given, take antibiotics as told by your doctor. Finish them even if you start to feel better.  Drink enough fluids to keep your pee (urine) clear or pale yellow.  Avoid tea, drinks with caffeine, and bubbly (carbonated) drinks.  Pee often. Avoid holding your pee in for a long time.  Pee before and after having sex (intercourse).  Wipe from front to back after you poop (bowel movement) if you are a woman. Use each tissue only once. GET HELP RIGHT AWAY IF:   You have back pain.  You have lower belly (abdominal) pain.  You have chills.  You feel sick to your stomach (nauseous).  You throw up (vomit).  Your burning or discomfort with peeing does not go away.  You have a fever.  Your symptoms are not better in 3 days. MAKE SURE YOU:   Understand these instructions.  Will watch your condition.  Will get help right away if you are not doing well or get worse. Document Released: 08/17/2007  Document Revised: 11/23/2011 Document Reviewed: 09/29/2011 [facility] Patient Information 2015 [place], [place]. This information is not intended to replace advice given to you by your health care provider. Make sure you discuss any questions you have with your health care provider.

## 2015-04-22 NOTE — Progress Notes (Signed)
Pre visit review using our clinic review tool, if applicable. No additional management support is needed unless otherwise documented below in the visit note. 

## 2015-04-23 ENCOUNTER — Other Ambulatory Visit: Payer: Medicare Other

## 2015-04-23 LAB — URINALYSIS W MICROSCOPIC + REFLEX CULTURE
BACTERIA UA: NONE SEEN [HPF]
BILIRUBIN URINE: NEGATIVE
CASTS: NONE SEEN [LPF]
CRYSTALS: NONE SEEN [HPF]
KETONES UR: NEGATIVE
Leukocytes, UA: NEGATIVE
Nitrite: NEGATIVE
SQUAMOUS EPITHELIAL / LPF: NONE SEEN [HPF] (ref <=5)
Specific Gravity, Urine: 1.006 (ref 1.001–1.035)
Yeast: NONE SEEN [HPF]
pH: 7 (ref 5.0–8.0)

## 2015-04-23 LAB — HEPATIC FUNCTION PANEL
ALT: 12 U/L (ref 0–53)
AST: 14 U/L (ref 0–37)
Albumin: 3.7 g/dL (ref 3.5–5.2)
Alkaline Phosphatase: 109 U/L (ref 39–117)
BILIRUBIN DIRECT: 0.1 mg/dL (ref 0.0–0.3)
BILIRUBIN TOTAL: 0.5 mg/dL (ref 0.2–1.2)
Total Protein: 6.7 g/dL (ref 6.0–8.3)

## 2015-04-24 ENCOUNTER — Telehealth: Payer: Self-pay | Admitting: *Deleted

## 2015-04-24 DIAGNOSIS — R319 Hematuria, unspecified: Secondary | ICD-10-CM

## 2015-04-24 LAB — CULTURE, URINE COMPREHENSIVE
COLONY COUNT: NO GROWTH
ORGANISM ID, BACTERIA: NO GROWTH

## 2015-04-24 NOTE — Telephone Encounter (Signed)
Called and spoke with the pt and informed him of recent lab results and note.  Pt verbalized understanding and agreed to the referral.  Referral placed and sent.  Pt stated that he does already have the follow-up scheduled for next week.//AB/CMA

## 2015-04-24 NOTE — Telephone Encounter (Signed)
-----   Message from Waldon Merl, PA-C sent at 04/24/2015  7:07 AM EST ----- Labs look good overall. His urine culture is negative for infection so antibiotic can be stopped. His urine did have a good amount of blood in the urine which has been seen previously. I would like to set him up urgently with Urology. Ok to place referral if he is willing. Also have him follow-up with me next week if he doesn't already have an appointment scheduled.

## 2015-04-27 MED FILL — LISINOPRIL 10 MG TABLET: 10 | 30 days supply | Qty: 30 | Fill #4

## 2015-04-29 ENCOUNTER — Encounter: Payer: Self-pay | Admitting: Physician Assistant

## 2015-04-29 ENCOUNTER — Ambulatory Visit (INDEPENDENT_AMBULATORY_CARE_PROVIDER_SITE_OTHER): Payer: Medicare Other | Admitting: Physician Assistant

## 2015-04-29 VITALS — BP 110/60 | HR 71 | Temp 97.8°F | Ht 66.0 in | Wt 179.6 lb

## 2015-04-29 DIAGNOSIS — R319 Hematuria, unspecified: Secondary | ICD-10-CM

## 2015-04-29 DIAGNOSIS — K219 Gastro-esophageal reflux disease without esophagitis: Secondary | ICD-10-CM | POA: Diagnosis not present

## 2015-04-29 NOTE — Progress Notes (Signed)
Pre visit review using our clinic review tool, if applicable. No additional management support is needed unless otherwise documented below in the visit note. 

## 2015-04-29 NOTE — Patient Instructions (Signed)
Please continue the Ranitidine as directed.  Start a daily probiotic. Avoid late-night eating.  Let me know if you have not heard from Urology by Friday.  I am checking on the status of the referral.

## 2015-04-29 NOTE — Progress Notes (Signed)
Patient presents to clinic today to follow-up of GERD and hematuria.   Patient noted to have significant RBC in urine at last visit. Urine culture negative. Hematuria also noted on review of last office UA. Patient was referred to Urology but states he has not heard from them. Denies noting blood in the urine. Denies any urinary symptoms.   Patient notes improvement in reflux and burning with the ranitidine twice daily. States he still has occasional symptoms at bedtime when lying down. Denies nausea or vomiting. Denies any residual abdominal discomfort. Denies change to bowel habits.   Past Medical History  Diagnosis Date  . Diabetes type 1, uncontrolled ([place])   . Frequent headaches   . Heart attack ([place]) 09.13.2014    x5  . Hyperlipidemia   . Chicken pox   . Mumps   . Measles, [place] (rubella)   . Scarlet fever   . Diabetic retinopathy ([place])   . Cataracts, bilateral   . Orthostatic hypotension   . CAD (coronary artery disease)   . Contrast dye induced nephropathy   . OSA (obstructive sleep apnea) 07/17/2013    Current Outpatient Prescriptions on File Prior to Visit  Medication Sig Dispense Refill  . acetaminophen (TYLENOL) 325 MG tablet Take 650 mg by mouth every 6 (six) hours as needed for mild pain.    Marland Kitchen aspirin 81 MG tablet Take 81 mg by mouth daily.    Marland Kitchen atorvastatin (LIPITOR) 40 MG tablet Take 1 tablet (40 mg total) by mouth daily. 90 tablet 3  . clopidogrel (PLAVIX) 75 MG tablet Take 1 tablet (75 mg total) by mouth daily. 90 tablet 3  . co-enzyme Q-10 30 MG capsule Take 30 mg by mouth daily.     . fludrocortisone (FLORINEF) 0.1 MG tablet     . gabapentin (NEURONTIN) 600 MG tablet Take 600 mg by mouth 2 (two) times daily.    Marland Kitchen glucagon 1 MG injection Inject 1 mg into the vein once as needed (Emergency Test Kit). Reported on 04/07/2015    . glucose blood test strip 1 each by Other route as directed. Use as instructed FREESTYLE IN VITRO    . HYDROcodone-acetaminophen  (NORCO/VICODIN) 5-325 MG tablet Take 1 tablet by mouth every 6 (six) hours as needed for severe pain. 60 tablet 0  . insulin lispro (HUMALOG) 100 UNIT/ML injection Inject 1-40 Units into the skin as directed. Use in Insulin Pump, Max daily dose: 100 units/day    . isosorbide mononitrate (IMDUR) 60 MG 24 hr tablet Take 1 tablet (60 mg total) by mouth daily. 30 tablet 11  . lisinopril (PRINIVIL,ZESTRIL) 10 MG tablet Take 10 mg by mouth daily.  11  . meclizine (ANTIVERT) 12.5 MG tablet Take 1 tablet (12.5 mg total) by mouth 3 (three) times daily as needed for dizziness. 30 tablet 0  . midodrine (PROAMATINE) 10 MG tablet TAKE 1 TABLET BY MOUTH TWICE DAILY 60 tablet 2  . nitroGLYCERIN (NITROSTAT) 0.4 MG SL tablet Place 1 tablet (0.4 mg total) under the tongue every 5 (five) minutes x 3 doses as needed for chest pain. 25 tablet 12  . ranitidine (ZANTAC) 150 MG capsule Take 1 capsule (150 mg total) by mouth 2 (two) times daily. 60 capsule 1  . SENEXON-S 8.6-50 MG tablet TAKE 1 TABLET BY MOUTH DAILY. 100 tablet 1  . zolpidem (AMBIEN CR) 12.5 MG CR tablet Take 1 tablet (12.5 mg total) by mouth at bedtime. 30 tablet 0   No current facility-administered medications on  file prior to visit.    No Known Allergies  Family History  Problem Relation Age of Onset  . Vascular Disease Father 30    Deceased  . Transient ischemic attack Father   . Hypertension Mother 83    Deceased  . Liver cancer Paternal Grandfather   . Breast cancer Sister   . Heart disease Sister     Ablation for rhythm disturbance  . Healthy Brother     x1  . Other Mother     s/p PPM    Social History   Social History  . Marital Status: Married    Spouse Name: N/A  . Number of Children: 1  . Years of Education: N/A   Social History Main Topics  . Smoking status: Never Smoker   . Smokeless tobacco: None  . Alcohol Use: Yes     Comment: Rare  . Drug Use: No  . Sexual Activity: Not Asked   Other Topics Concern  . None     Social History Narrative   He is retired Clinical biochemist from trauma centers   He lives with wife.  They have one grown daughter.   Highest level of education:  Master's Degree   Review of Systems - See HPI.  All other ROS are negative.  BP 110/60 mmHg  Pulse 71  Temp(Src) 97.8 F (36.6 C) (Oral)  Ht 5' 6"  (1.676 m)  Wt 179 lb 9.6 oz (81.466 kg)  BMI 29.00 kg/m2  SpO2 99%  Physical Exam  Constitutional: He is well-developed, well-nourished, and in no distress.  HENT:  Head: Normocephalic and atraumatic.  Eyes: Conjunctivae are normal.  Cardiovascular: Normal rate, regular rhythm, normal heart sounds and intact distal pulses.   Pulmonary/Chest: Effort normal.  Abdominal: Soft. Bowel sounds are normal. He exhibits no distension and no mass. There is no tenderness. There is no rebound and no guarding.  Skin: Skin is warm and dry. No rash noted.  Psychiatric: Affect normal.  Vitals reviewed.   Recent Results (from the past 2160 hour(s))  POC CBG, ED     Status: Abnormal   Collection Time: 03/17/15  5:15 PM  Result Value Ref Range   Glucose-Capillary 181 (H) 65 - 99 mg/dL  Comprehensive metabolic panel     Status: Abnormal   Collection Time: 03/17/15  7:30 PM  Result Value Ref Range   Sodium 138 135 - 145 mmol/L   Potassium 2.9 (L) 3.5 - 5.1 mmol/L   Chloride 101 101 - 111 mmol/L   CO2 28 22 - 32 mmol/L   Glucose, Bld 184 (H) 65 - 99 mg/dL   BUN 15 6 - 20 mg/dL   Creatinine, Ser 1.46 (H) 0.61 - 1.24 mg/dL   Calcium 8.3 (L) 8.9 - 10.3 mg/dL   Total Protein 7.0 6.5 - 8.1 g/dL   Albumin 3.7 3.5 - 5.0 g/dL   AST 30 15 - 41 U/L   ALT 17 17 - 63 U/L   Alkaline Phosphatase 110 38 - 126 U/L   Total Bilirubin 1.0 0.3 - 1.2 mg/dL   GFR calc non Af Amer 50 (L) >60 mL/min   GFR calc Af Amer 58 (L) >60 mL/min    Comment: (NOTE) The eGFR has been calculated using the CKD EPI equation. This calculation has not been validated in all clinical situations. eGFR's persistently <60 mL/min  signify possible Chronic Kidney Disease.    Anion gap 9 5 - 15  Lipase, blood     Status: None  Collection Time: 03/17/15  7:30 PM  Result Value Ref Range   Lipase 14 11 - 51 U/L  Troponin I     Status: Abnormal   Collection Time: 03/17/15  7:30 PM  Result Value Ref Range   Troponin I 3.99 (HH) <0.031 ng/mL    Comment:        POSSIBLE MYOCARDIAL ISCHEMIA. SERIAL TESTING RECOMMENDED. CRITICAL RESULT CALLED TO, READ BACK BY AND VERIFIED WITH: B.BROOKS RN AT 2016 03/17/15 BY I.[email or web address]   CBC with Differential     Status: Abnormal   Collection Time: 03/17/15  7:30 PM  Result Value Ref Range   WBC 8.3 4.0 - 10.5 K/uL   RBC 4.43 4.22 - 5.81 MIL/uL   Hemoglobin 12.4 (L) 13.0 - 17.0 g/dL   HCT 38.0 (L) 39.0 - 52.0 %   MCV 85.8 78.0 - 100.0 fL   MCH 28.0 26.0 - 34.0 pg   MCHC 32.6 30.0 - 36.0 g/dL   RDW 12.6 11.5 - 15.5 %   Platelets 218 150 - 400 K/uL   Neutrophils Relative % 77 %   Neutro Abs 6.3 1.7 - 7.7 K/uL   Lymphocytes Relative 13 %   Lymphs Abs 1.1 0.7 - 4.0 K/uL   Monocytes Relative 10 %   Monocytes Absolute 0.8 0.1 - 1.0 K/uL   Eosinophils Relative 0 %   Eosinophils Absolute 0.0 0.0 - 0.7 K/uL   Basophils Relative 0 %   Basophils Absolute 0.0 0.0 - 0.1 K/uL  Brain natriuretic peptide     Status: Abnormal   Collection Time: 03/17/15  7:30 PM  Result Value Ref Range   B Natriuretic Peptide 451.1 (H) 0.0 - 100.0 pg/mL  Troponin I     Status: Abnormal   Collection Time: 03/18/15 12:55 AM  Result Value Ref Range   Troponin I 2.90 (HH) <0.031 ng/mL    Comment:        POSSIBLE MYOCARDIAL ISCHEMIA. SERIAL TESTING RECOMMENDED. CRITICAL RESULT CALLED TO, READ BACK BY AND VERIFIED WITH: BROOKS,B,RN @ 0142 03/18/15 BY GWYN,P   Brain natriuretic peptide     Status: Abnormal   Collection Time: 03/18/15 12:55 AM  Result Value Ref Range   B Natriuretic Peptide 411.7 (H) 0.0 - 100.0 pg/mL  Glucose, capillary     Status: Abnormal   Collection Time: 03/18/15  1:41 AM  Result  Value Ref Range   Glucose-Capillary 382 (H) 65 - 99 mg/dL  MRSA PCR Screening     Status: Abnormal   Collection Time: 03/18/15  1:43 AM  Result Value Ref Range   MRSA by PCR POSITIVE (A) NEGATIVE    Comment:        The GeneXpert MRSA Assay (FDA approved for NASAL specimens only), is one component of a comprehensive MRSA colonization surveillance program. It is not intended to diagnose MRSA infection nor to guide or monitor treatment for MRSA infections. RESULT CALLED TO, READ BACK BY AND VERIFIED WITH: B BUCKNER'@0524'$  03/18/15 MKELLY   Urinalysis, Routine w reflex microscopic     Status: Abnormal   Collection Time: 03/18/15  2:00 AM  Result Value Ref Range   Color, Urine YELLOW YELLOW   APPearance CLEAR CLEAR   Specific Gravity, Urine 1.014 1.005 - 1.030   pH 6.0 5.0 - 8.0   Glucose, UA 500 (A) NEGATIVE mg/dL   Hgb urine dipstick TRACE (A) NEGATIVE   Bilirubin Urine NEGATIVE NEGATIVE   Ketones, ur 15 (A) NEGATIVE mg/dL   Protein, ur 100 (A)  NEGATIVE mg/dL   Nitrite NEGATIVE NEGATIVE   Leukocytes, UA NEGATIVE NEGATIVE  Urine microscopic-add on     Status: Abnormal   Collection Time: 03/18/15  2:00 AM  Result Value Ref Range   Squamous Epithelial / LPF 0-5 (A) NONE SEEN   WBC, UA 0-5 0 - 5 WBC/hpf   RBC / HPF 0-5 0 - 5 RBC/hpf   Bacteria, UA RARE (A) NONE SEEN   Casts HYALINE CASTS (A) NEGATIVE  Influenza panel by PCR (type A & B, H1N1)     Status: None   Collection Time: 03/18/15  5:25 AM  Result Value Ref Range   Influenza A By PCR NEGATIVE NEGATIVE   Influenza B By PCR NEGATIVE NEGATIVE   H1N1 flu by pcr NOT DETECTED NOT DETECTED    Comment:        The Xpert Flu assay (FDA approved for nasal aspirates or washes and nasopharyngeal swab specimens), is intended as an aid in the diagnosis of influenza and should not be used as a sole basis for treatment.   Protime-INR     Status: Abnormal   Collection Time: 03/18/15  7:09 AM  Result Value Ref Range   Prothrombin  Time 15.8 (H) 11.6 - 15.2 seconds   INR 1.24 0.00 - 1.49  Glucose, capillary     Status: Abnormal   Collection Time: 03/18/15  8:15 AM  Result Value Ref Range   Glucose-Capillary 440 (H) 65 - 99 mg/dL  Basic metabolic panel     Status: Abnormal   Collection Time: 03/18/15 10:30 AM  Result Value Ref Range   Sodium 137 135 - 145 mmol/L   Potassium 3.1 (L) 3.5 - 5.1 mmol/L   Chloride 99 (L) 101 - 111 mmol/L   CO2 25 22 - 32 mmol/L   Glucose, Bld 418 (H) 65 - 99 mg/dL   BUN 17 6 - 20 mg/dL   Creatinine, Ser 1.58 (H) 0.61 - 1.24 mg/dL   Calcium 8.4 (L) 8.9 - 10.3 mg/dL   GFR calc non Af Amer 46 (L) >60 mL/min   GFR calc Af Amer 53 (L) >60 mL/min    Comment: (NOTE) The eGFR has been calculated using the CKD EPI equation. This calculation has not been validated in all clinical situations. eGFR's persistently <60 mL/min signify possible Chronic Kidney Disease.    Anion gap 13 5 - 15  CBC     Status: Abnormal   Collection Time: 03/18/15 10:30 AM  Result Value Ref Range   WBC 7.9 4.0 - 10.5 K/uL   RBC 4.36 4.22 - 5.81 MIL/uL   Hemoglobin 12.5 (L) 13.0 - 17.0 g/dL   HCT 36.9 (L) 39.0 - 52.0 %   MCV 84.6 78.0 - 100.0 fL   MCH 28.7 26.0 - 34.0 pg   MCHC 33.9 30.0 - 36.0 g/dL   RDW 13.0 11.5 - 15.5 %   Platelets 200 150 - 400 K/uL  Heparin level (unfractionated)     Status: None   Collection Time: 03/18/15 10:30 AM  Result Value Ref Range   Heparin Unfractionated 0.70 0.30 - 0.70 IU/mL    Comment:        IF HEPARIN RESULTS ARE BELOW EXPECTED VALUES, AND PATIENT DOSAGE HAS BEEN CONFIRMED, SUGGEST FOLLOW UP TESTING OF ANTITHROMBIN III LEVELS.   Lipid panel     Status: Abnormal   Collection Time: 03/18/15 10:30 AM  Result Value Ref Range   Cholesterol 113 0 - 200 mg/dL   Triglycerides  74 <150 mg/dL   HDL 32 (L) >40 mg/dL   Total CHOL/HDL Ratio 3.5 RATIO   VLDL 15 0 - 40 mg/dL   LDL Cholesterol 66 0 - 99 mg/dL    Comment:        Total Cholesterol/HDL:CHD Risk Coronary Heart  Disease Risk Table                     Men   Women  1/2 Average Risk   3.4   3.3  Average Risk       5.0   4.4  2 X Average Risk   9.6   7.1  3 X Average Risk  23.4   11.0        Use the calculated Patient Ratio above and the CHD Risk Table to determine the patient's CHD Risk.        ATP III CLASSIFICATION (LDL):  <100     mg/dL   Optimal  100-129  mg/dL   Near or Above                    Optimal  130-159  mg/dL   Borderline  160-189  mg/dL   High  >190     mg/dL   Very High   Troponin I     Status: Abnormal   Collection Time: 03/18/15 10:30 AM  Result Value Ref Range   Troponin I 1.27 (HH) <0.031 ng/mL    Comment:        POSSIBLE MYOCARDIAL ISCHEMIA. SERIAL TESTING RECOMMENDED. CRITICAL RESULT CALLED TO, READ BACK BY AND VERIFIED WITH: FIELDS,D RN @ 0109 03/18/15 LEONARD,A   Glucose, capillary     Status: Abnormal   Collection Time: 03/18/15 11:40 AM  Result Value Ref Range   Glucose-Capillary 406 (H) 65 - 99 mg/dL   Comment 1 Notify RN    Comment 2 Document in Chart   Glucose, capillary     Status: Abnormal   Collection Time: 03/18/15  2:06 PM  Result Value Ref Range   Glucose-Capillary 224 (H) 65 - 99 mg/dL   Comment 1 Notify RN    Comment 2 Document in Chart   Glucose, capillary     Status: Abnormal   Collection Time: 03/18/15  4:20 PM  Result Value Ref Range   Glucose-Capillary 175 (H) 65 - 99 mg/dL  Glucose, capillary     Status: Abnormal   Collection Time: 03/18/15 10:17 PM  Result Value Ref Range   Glucose-Capillary 198 (H) 65 - 99 mg/dL  Basic metabolic panel     Status: Abnormal   Collection Time: 03/19/15  5:20 AM  Result Value Ref Range   Sodium 136 135 - 145 mmol/L   Potassium 3.8 3.5 - 5.1 mmol/L   Chloride 100 (L) 101 - 111 mmol/L   CO2 24 22 - 32 mmol/L   Glucose, Bld 301 (H) 65 - 99 mg/dL   BUN 18 6 - 20 mg/dL   Creatinine, Ser 1.24 0.61 - 1.24 mg/dL   Calcium 8.3 (L) 8.9 - 10.3 mg/dL   GFR calc non Af Amer >60 >60 mL/min   GFR calc Af Amer  >60 >60 mL/min    Comment: (NOTE) The eGFR has been calculated using the CKD EPI equation. This calculation has not been validated in all clinical situations. eGFR's persistently <60 mL/min signify possible Chronic Kidney Disease.    Anion gap 12 5 - 15  CBC     Status: Abnormal  Collection Time: 03/19/15  5:20 AM  Result Value Ref Range   WBC 7.5 4.0 - 10.5 K/uL   RBC 4.07 (L) 4.22 - 5.81 MIL/uL   Hemoglobin 11.8 (L) 13.0 - 17.0 g/dL   HCT 35.0 (L) 39.0 - 52.0 %   MCV 86.0 78.0 - 100.0 fL   MCH 29.0 26.0 - 34.0 pg   MCHC 33.7 30.0 - 36.0 g/dL   RDW 13.1 11.5 - 15.5 %   Platelets 206 150 - 400 K/uL  Heparin level (unfractionated)     Status: Abnormal   Collection Time: 03/19/15  5:52 AM  Result Value Ref Range   Heparin Unfractionated 0.25 (L) 0.30 - 0.70 IU/mL    Comment:        IF HEPARIN RESULTS ARE BELOW EXPECTED VALUES, AND PATIENT DOSAGE HAS BEEN CONFIRMED, SUGGEST FOLLOW UP TESTING OF ANTITHROMBIN III LEVELS.   Glucose, capillary     Status: Abnormal   Collection Time: 03/19/15  7:49 AM  Result Value Ref Range   Glucose-Capillary 416 (H) 65 - 99 mg/dL  Brain natriuretic peptide     Status: Abnormal   Collection Time: 03/19/15 12:30 PM  Result Value Ref Range   B Natriuretic Peptide 377.5 (H) 0.0 - 100.0 pg/mL  Glucose, capillary     Status: Abnormal   Collection Time: 03/19/15 12:46 PM  Result Value Ref Range   Glucose-Capillary 249 (H) 65 - 99 mg/dL  Glucose, capillary     Status: Abnormal   Collection Time: 03/19/15  5:14 PM  Result Value Ref Range   Glucose-Capillary 175 (H) 65 - 99 mg/dL  Glucose, capillary     Status: Abnormal   Collection Time: 03/19/15  9:26 PM  Result Value Ref Range   Glucose-Capillary 134 (H) 65 - 99 mg/dL  Glucose, capillary     Status: Abnormal   Collection Time: 03/20/15 12:05 AM  Result Value Ref Range   Glucose-Capillary 211 (H) 65 - 99 mg/dL  Glucose, capillary     Status: Abnormal   Collection Time: 03/20/15  4:23 AM    Result Value Ref Range   Glucose-Capillary 141 (H) 65 - 99 mg/dL  Glucose, capillary     Status: Abnormal   Collection Time: 03/20/15  8:06 AM  Result Value Ref Range   Glucose-Capillary 109 (H) 65 - 99 mg/dL  Glucose, capillary     Status: Abnormal   Collection Time: 03/20/15 11:56 AM  Result Value Ref Range   Glucose-Capillary 219 (H) 65 - 99 mg/dL   Comment 1 Notify RN   Basic metabolic panel     Status: Abnormal   Collection Time: 03/20/15 12:30 PM  Result Value Ref Range   Sodium 137 135 - 145 mmol/L   Potassium 3.7 3.5 - 5.1 mmol/L   Chloride 100 (L) 101 - 111 mmol/L   CO2 25 22 - 32 mmol/L   Glucose, Bld 241 (H) 65 - 99 mg/dL   BUN 19 6 - 20 mg/dL   Creatinine, Ser 1.17 0.61 - 1.24 mg/dL   Calcium 8.8 (L) 8.9 - 10.3 mg/dL   GFR calc non Af Amer >60 >60 mL/min   GFR calc Af Amer >60 >60 mL/min    Comment: (NOTE) The eGFR has been calculated using the CKD EPI equation. This calculation has not been validated in all clinical situations. eGFR's persistently <60 mL/min signify possible Chronic Kidney Disease.    Anion gap 12 5 - 15  TSH     Status: None  Collection Time: 03/27/15 11:09 AM  Result Value Ref Range   TSH 2.434 0.350 - 4.500 uIU/mL  Hepatic function panel     Status: Abnormal   Collection Time: 04/07/15  2:18 PM  Result Value Ref Range   Total Bilirubin 0.8 0.2 - 1.2 mg/dL   Bilirubin, Direct 0.2 0.0 - 0.3 mg/dL   Alkaline Phosphatase 133 (H) 39 - 117 U/L   AST 16 0 - 37 U/L   ALT 15 0 - 53 U/L   Total Protein 6.5 6.0 - 8.3 g/dL   Albumin 3.8 3.5 - 5.2 g/dL  Vitamin D (25 hydroxy)     Status: None   Collection Time: 04/07/15  2:18 PM  Result Value Ref Range   VITD 38.93 30.00 - 100.00 ng/mL  PSA, Medicare     Status: None   Collection Time: 04/07/15  2:18 PM  Result Value Ref Range   PSA 1.25 0.10 - 4.00 ng/ml  Urinalysis, Routine w reflex microscopic     Status: Abnormal   Collection Time: 04/07/15  2:18 PM  Result Value Ref Range   Color,  Urine YELLOW Yellow;Lt. Yellow   APPearance CLEAR Clear   Specific Gravity, Urine 1.015 1.000-1.030   pH 6.0 5.0 - 8.0   Total Protein, Urine 100 (A) Negative   Urine Glucose 500 (A) Negative   Ketones, ur NEGATIVE Negative   Bilirubin Urine NEGATIVE Negative   Hgb urine dipstick MODERATE (A) Negative   Urobilinogen, UA 0.2 0.0 - 1.0   Leukocytes, UA NEGATIVE Negative   Nitrite NEGATIVE Negative   WBC, UA 0-2/hpf 0-2/hpf   RBC / HPF 3-6/hpf (A) 0-2/hpf   Squamous Epithelial / LPF Rare(0-4/hpf) Rare(0-4/hpf)  POCT Urinalysis Dipstick (Automated)     Status: Abnormal   Collection Time: 04/22/15 11:30 AM  Result Value Ref Range   Color, UA light-yellow    Clarity, UA cloudy    Glucose, UA neg    Bilirubin, UA neg    Ketones, UA neg    Spec Grav, UA 1.010    Blood, UA 3+    pH, UA 6.5    Protein, UA 1+    Urobilinogen, UA 0.2    Nitrite, UA neg    Leukocytes, UA small (1+) (A) Negative  CULTURE, URINE COMPREHENSIVE     Status: None   Collection Time: 04/22/15 11:38 AM  Result Value Ref Range   Colony Count NO GROWTH    Organism ID, Bacteria NO GROWTH   CBC     Status: Abnormal   Collection Time: 04/22/15 11:45 AM  Result Value Ref Range   WBC 6.1 4.0 - 10.5 K/uL   RBC 4.23 4.22 - 5.81 Mil/uL   Platelets 309.0 150.0 - 400.0 K/uL   Hemoglobin 11.8 (L) 13.0 - 17.0 g/dL   HCT 35.8 (L) 39.0 - 52.0 %   MCV 84.7 78.0 - 100.0 fl   MCHC 33.1 30.0 - 36.0 g/dL   RDW 14.5 11.5 - 15.5 %  Lipase     Status: None   Collection Time: 04/22/15 11:45 AM  Result Value Ref Range   Lipase 15.0 11.0 - 59.0 U/L  Urinalysis with Culture Reflex     Status: Abnormal   Collection Time: 04/22/15 11:45 AM  Result Value Ref Range   Color, Urine YELLOW YELLOW    Comment: ** Please note change in unit of measure and reference range(s). **      APPearance CLEAR CLEAR   Specific Gravity, Urine 1.006  1.001 - 1.035   pH 7.0 5.0 - 8.0   Glucose, UA TRACE (A) NEGATIVE   Bilirubin Urine NEGATIVE  NEGATIVE   Ketones, ur NEGATIVE NEGATIVE   Hgb urine dipstick 3+ (A) NEGATIVE   Protein, ur 1+ (A) NEGATIVE   Nitrite NEGATIVE NEGATIVE   Leukocytes, UA NEGATIVE NEGATIVE   WBC, UA 0-5 <=5 WBC/HPF   RBC / HPF >60 (A) <=2 RBC/HPF   Squamous Epithelial / LPF NONE SEEN <=5 HPF   Bacteria, UA NONE SEEN NONE SEEN HPF   Crystals NONE SEEN NONE SEEN HPF   Casts NONE SEEN NONE SEEN LPF   Yeast NONE SEEN NONE SEEN HPF  Hepatic function panel     Status: None   Collection Time: 04/22/15 11:45 AM  Result Value Ref Range   Total Bilirubin 0.5 0.2 - 1.2 mg/dL   Bilirubin, Direct 0.1 0.0 - 0.3 mg/dL   Alkaline Phosphatase 109 39 - 117 U/L   AST 14 0 - 37 U/L   ALT 12 0 - 53 U/L   Total Protein 6.7 6.0 - 8.3 g/dL   Albumin 3.7 3.5 - 5.2 g/dL    Assessment/Plan: Hematuria Asymptomatic. Spoke with [facility] who was able to get patient in with [facility] tomorrow morning at 9:45. Patient to have specialist fax note to our office.  Gastroesophageal reflux disease without esophagitis Avoiding PPI due to Plavix use. Continue ranitidine. Elevate HOB. No late night eating. Add on a daily probiotic. Follow-up if symptoms persisting.

## 2015-04-30 DIAGNOSIS — R319 Hematuria, unspecified: Secondary | ICD-10-CM | POA: Insufficient documentation

## 2015-04-30 NOTE — Assessment & Plan Note (Signed)
Avoiding PPI due to Plavix use. Continue ranitidine. Elevate HOB. No late night eating. Add on a daily probiotic. Follow-up if symptoms persisting.

## 2015-04-30 NOTE — Assessment & Plan Note (Signed)
Asymptomatic. Spoke with [facility] who was able to get patient in with [facility] tomorrow morning at 9:45. Patient to have specialist fax note to our office.

## 2015-05-08 ENCOUNTER — Other Ambulatory Visit: Payer: Self-pay | Admitting: Physician Assistant

## 2015-05-08 MED FILL — MIDODRINE HCL 10 MG TABLET: 10 | 30 days supply | Qty: 60 | Fill #0

## 2015-05-08 MED FILL — FLUDROCORTISONE 0.1 MG TAB: 0.1 | 30 days supply | Qty: 60 | Fill #0

## 2015-05-15 MED FILL — NITROFURANTOIN MONO-MCR 100: 100 | 5 days supply | Qty: 10 | Fill #0

## 2015-05-19 MED FILL — raNITIdine HCL 150 MG TABS: 150 | 30 days supply | Qty: 60 | Fill #1

## 2015-05-19 MED FILL — ISOSORBIDE MN ER 60 MG TAB: 60 | 30 days supply | Qty: 30 | Fill #2

## 2015-05-22 ENCOUNTER — Telehealth: Payer: Self-pay | Admitting: Physician Assistant

## 2015-05-22 ENCOUNTER — Telehealth: Payer: Self-pay | Admitting: *Deleted

## 2015-05-22 MED ORDER — ZOLPIDEM TARTRATE ER 12.5 MG PO TBCR: 12.5000 mg | EXTENDED_RELEASE_TABLET | Freq: Every day | ORAL | Status: DC

## 2015-05-22 MED FILL — ZOLPIDEM TART ER 12.5 MG TA: 12.5 | 30 days supply | Qty: 30 | Fill #0

## 2015-05-22 NOTE — Telephone Encounter (Signed)
Refill granted. Please fax in.

## 2015-05-22 NOTE — Telephone Encounter (Signed)
Rx printed and faxed to the pharmacy.  Confirmation received.//AB/CMA 

## 2015-05-22 NOTE — Telephone Encounter (Signed)
Refill sent.

## 2015-05-22 NOTE — Telephone Encounter (Signed)
Requesting Zolpidem Tart 12.5mg -Take 1 tablet by mouth at bedtime. Last refill:04/22/15 Last OV:04/29/15 Please advise.//AB/CMA

## 2015-05-22 NOTE — Telephone Encounter (Signed)
Caller name: Self  Can be reached: [phone]  Pharmacy:  [facility] - [place], [place] [address] [phone] (Phone) [phone] (Fax)        Reason for call: Refill zolpidem (AMBIEN CR) 12.5 MG CR tablet [[id number]][[id number]]

## 2015-05-26 MED FILL — LISINOPRIL 10 MG TABLET: 10 | 30 days supply | Qty: 30 | Fill #5

## 2015-06-08 ENCOUNTER — Other Ambulatory Visit: Payer: Self-pay | Admitting: Physician Assistant

## 2015-06-08 MED FILL — MIDODRINE HCL 10 MG TABLET: 10 | 30 days supply | Qty: 60 | Fill #1

## 2015-06-08 MED FILL — ATORVASTATIN 40 MG TABLET: 40 | 90 days supply | Qty: 90 | Fill #2

## 2015-06-08 MED FILL — FLUDROCORTISONE 0.1 MG TAB: 0.1 | 30 days supply | Qty: 60 | Fill #1

## 2015-06-08 MED FILL — GABAPENTIN 600 MG TABLET: 600 | 30 days supply | Qty: 90 | Fill #2

## 2015-06-08 MED FILL — FREESTYLE TEST STRIPS: 59 days supply | Qty: 200 | Fill #4

## 2015-06-09 NOTE — Telephone Encounter (Signed)
Last filled: 04/22/15 Amt: 60,1 Last OV: 04/29/15 Med Filled.

## 2015-06-11 MED FILL — raNITIdine HCL 150 MG TABS: 150 | 30 days supply | Qty: 60 | Fill #0

## 2015-06-17 MED FILL — ISOSORBIDE MN ER 60 MG TAB: 60 | 30 days supply | Qty: 30 | Fill #3

## 2015-06-22 MED FILL — ZOLPIDEM TART ER 12.5 MG TA: 12.5 | 30 days supply | Qty: 30 | Fill #1

## 2015-06-25 MED FILL — LISINOPRIL 10 MG TABLET: 10 | 30 days supply | Qty: 30 | Fill #6

## 2015-06-25 MED FILL — CLOPIDOGREL 75 MG TABLET: 75 | 90 days supply | Qty: 90 | Fill #1

## 2015-06-30 NOTE — Progress Notes (Signed)
HPI: FU CAD. Patient previously lived in [place]. He had his first myocardial infarction in 2010. He has had subsequent events. He moved here in May of 2014. Cardiac catheterization in September 2014 and had stent to the mid right coronary artery. There was note of a mid-90% LAD; small caliber and not suitable for PCI or grafting. Ejection fraction 60%. Procedure complicated by contrast nephropathy requiring dialysis. Carotid Dopplers February 2015 showed mild focal plaques in both carotid bifurcations but no hemodynamically significant stenosis. Also with h/o orthostatic hypotension. Last cardiac catheterization in January 2017 showed severe diffuse disease in the LAD and circumflex and diffuse distal disease in the RCA. The stent in the right coronary was patent. Echocardiogram January 2017 showed normal LV function. Patient was treated medically for his coronary disease as his targets were felt to be poor and he did not wish to pursue further evaluation.Since last seen, Patient denies dyspnea on exertion, orthopnea, PND, palpitations, syncope or chest pain. He has developed mild pedal edema.  Current Outpatient Prescriptions  Medication Sig Dispense Refill  . acetaminophen (TYLENOL) 325 MG tablet Take 650 mg by mouth every 6 (six) hours as needed for mild pain.    Marland Kitchen aspirin 81 MG tablet Take 81 mg by mouth daily.    Marland Kitchen atorvastatin (LIPITOR) 40 MG tablet Take 1 tablet (40 mg total) by mouth daily. 90 tablet 3  . clopidogrel (PLAVIX) 75 MG tablet Take 1 tablet (75 mg total) by mouth daily. 90 tablet 3  . co-enzyme Q-10 30 MG capsule Take 30 mg by mouth daily.     . fludrocortisone (FLORINEF) 0.1 MG tablet TAKE 1 TABLET BY MOUTH TWICE DAILY 60 tablet 2  . gabapentin (NEURONTIN) 600 MG tablet Take 600 mg by mouth 2 (two) times daily.    Marland Kitchen glucagon 1 MG injection Inject 1 mg into the vein once as needed (Emergency Test Kit). Reported on 04/07/2015    . glucose blood test strip 1 each by Other  route as directed. Use as instructed FREESTYLE IN VITRO    . HYDROcodone-acetaminophen (NORCO/VICODIN) 5-325 MG tablet Take 1 tablet by mouth every 6 (six) hours as needed for severe pain. 60 tablet 0  . insulin lispro (HUMALOG) 100 UNIT/ML injection Inject 1-40 Units into the skin as directed. Use in Insulin Pump, Max daily dose: 100 units/day    . isosorbide mononitrate (IMDUR) 60 MG 24 hr tablet Take 1 tablet (60 mg total) by mouth daily. 30 tablet 11  . lisinopril (PRINIVIL,ZESTRIL) 10 MG tablet Take 10 mg by mouth daily.  11  . meclizine (ANTIVERT) 12.5 MG tablet Take 1 tablet (12.5 mg total) by mouth 3 (three) times daily as needed for dizziness. 30 tablet 0  . midodrine (PROAMATINE) 10 MG tablet TAKE 1 TABLET BY MOUTH TWICE DAILY 60 tablet 2  . nitroGLYCERIN (NITROSTAT) 0.4 MG SL tablet Place 1 tablet (0.4 mg total) under the tongue every 5 (five) minutes x 3 doses as needed for chest pain. 25 tablet 12  . ranitidine (ZANTAC) 150 MG tablet TAKE 1 TABLET (150 MG TOTAL) BY MOUTH 2 (TWO) TIMES DAILY. 60 tablet 1  . SENEXON-S 8.6-50 MG tablet TAKE 1 TABLET BY MOUTH DAILY. 100 tablet 1  . zolpidem (AMBIEN CR) 12.5 MG CR tablet Take 1 tablet (12.5 mg total) by mouth at bedtime. 30 tablet 2   No current facility-administered medications for this visit.     Past Medical History  Diagnosis Date  . Diabetes  type 1, uncontrolled ([place])   . Frequent headaches   . Heart attack ([place]) 09.13.2014    x5  . Hyperlipidemia   . Chicken pox   . Mumps   . Measles, [place] (rubella)   . Scarlet fever   . Diabetic retinopathy ([place])   . Cataracts, bilateral   . Orthostatic hypotension   . CAD (coronary artery disease)   . Contrast dye induced nephropathy   . OSA (obstructive sleep apnea) 07/17/2013    Past Surgical History  Procedure Laterality Date  . Coronary angioplasty with stent placement    . Appendectomy  1966  . Wisdom tooth extraction    . Refractive surgery      Retinopathy  . Cardiac  catheterization N/A 03/18/2015    Procedure: Left Heart Cath and Coronary Angiography;  Surgeon: Belva Crome, MD;  Location: [facility];  Service: Cardiovascular;  Laterality: N/A;    Social History   Social History  . Marital Status: Married    Spouse Name: N/A  . Number of Children: 1  . Years of Education: N/A   Occupational History  . Not on file.   Social History Main Topics  . Smoking status: Never Smoker   . Smokeless tobacco: Not on file  . Alcohol Use: Yes     Comment: Rare  . Drug Use: No  . Sexual Activity: Not on file   Other Topics Concern  . 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  They have one grown daughter.   Highest level of education:  Master's Degree    Family History  Problem Relation Age of Onset  . Vascular Disease Father 31    Deceased  . Transient ischemic attack Father   . Hypertension Mother 4    Deceased  . Liver cancer Paternal Grandfather   . Breast cancer Sister   . Heart disease Sister     Ablation for rhythm disturbance  . Healthy Brother     x1  . Other Mother     s/p PPM    ROS: Peripheral neuropathy but no fevers or chills, productive cough, hemoptysis, dysphasia, odynophagia, melena, hematochezia, dysuria, hematuria, rash, seizure activity, orthopnea, PND, claudication. Remaining systems are negative.  Physical Exam: Well-developed well-nourished in no acute distress.  Skin is warm and dry.  HEENT is normal.  Neck is supple.  Chest is clear to auscultation with normal expansion.  Cardiovascular exam is regular rate and rhythm.  Abdominal exam nontender or distended. No masses palpated. Extremities show 1+ edema. neuro grossly intact

## 2015-07-01 ENCOUNTER — Ambulatory Visit (INDEPENDENT_AMBULATORY_CARE_PROVIDER_SITE_OTHER): Payer: Medicare Other | Admitting: Cardiology

## 2015-07-01 ENCOUNTER — Encounter: Payer: Self-pay | Admitting: Cardiology

## 2015-07-01 VITALS — BP 163/74 | HR 76 | Ht 66.0 in | Wt 191.1 lb

## 2015-07-01 DIAGNOSIS — I2510 Atherosclerotic heart disease of native coronary artery without angina pectoris: Secondary | ICD-10-CM

## 2015-07-01 DIAGNOSIS — Z9861 Coronary angioplasty status: Secondary | ICD-10-CM | POA: Diagnosis not present

## 2015-07-01 DIAGNOSIS — R609 Edema, unspecified: Secondary | ICD-10-CM | POA: Insufficient documentation

## 2015-07-01 DIAGNOSIS — E785 Hyperlipidemia, unspecified: Secondary | ICD-10-CM

## 2015-07-01 DIAGNOSIS — I951 Orthostatic hypotension: Secondary | ICD-10-CM

## 2015-07-01 MED ORDER — FUROSEMIDE 20 MG PO TABS: 20.0000 mg | ORAL_TABLET | Freq: Every day | ORAL | Status: DC

## 2015-07-01 MED ORDER — ROSUVASTATIN CALCIUM 40 MG PO TABS: 40.0000 mg | ORAL_TABLET | Freq: Every day | ORAL | Status: DC

## 2015-07-01 MED FILL — ROSUVASTATIN CALCIUM 40 MG: 40 | 90 days supply | Qty: 90 | Fill #0

## 2015-07-01 MED FILL — FUROSEMIDE 20 MG TABLET: 20 | 90 days supply | Qty: 90 | Fill #0

## 2015-07-01 MED FILL — FLUDROCORTISONE 0.1 MG TAB: 0.1 | 30 days supply | Qty: 60 | Fill #2

## 2015-07-01 NOTE — Assessment & Plan Note (Signed)
Patient has 1+ edema. Possible diastolic congestive heart failure plus contribution from diabetic nephropathy. I will add Lasix 20 mg daily. Check potassium and renal function in 1 week.

## 2015-07-01 NOTE — Assessment & Plan Note (Signed)
Continue present medications. 

## 2015-07-01 NOTE — Patient Instructions (Signed)
Medication Instructions:   START FUROSEMIDE 20 MG ONCE DAILY  START ROSUVASTATIN 40 MG ONCE DAILY WHEN FINISHED WITH CURRENT SUPPLY OF LIPITOR  Labwork:  Your physician recommends that you return for lab work in: ONE WEEK  Your physician recommends that you return for lab work in: 4 WEEKS= DO NOT EAT PRIOR TO LAB WORK  Follow-Up:  Your physician recommends that you schedule a follow-up appointment in: 3 MONTHS WITH DR Jens SomRENSHAW

## 2015-07-01 NOTE — Assessment & Plan Note (Signed)
Continue aspirin, Plavix and statin. He has significant coronary disease. His targets are not ideal for coronary artery bypass graft and he states he would never consider this regardless. We will therefore plan medical therapy.

## 2015-07-01 NOTE — Assessment & Plan Note (Signed)
Given document coronary disease I would like for him to be on high-dose statin. He has had some myalgias with higher dose Lipitor. I will discontinue after present prescription expires. We will then begin Crestor 40 mg daily. Check lipids and liver 4 weeks later.

## 2015-07-06 MED FILL — MIDODRINE HCL 10 MG TABLET: 10 | 30 days supply | Qty: 60 | Fill #2

## 2015-07-10 LAB — BASIC METABOLIC PANEL
BUN: 22 mg/dL (ref 7–25)
CALCIUM: 8.7 mg/dL (ref 8.6–10.3)
CO2: 26 mmol/L (ref 20–31)
CREATININE: 1.28 mg/dL — AB (ref 0.70–1.25)
Chloride: 102 mmol/L (ref 98–110)
GLUCOSE: 243 mg/dL — AB (ref 65–99)
Potassium: 4.2 mmol/L (ref 3.5–5.3)
SODIUM: 139 mmol/L (ref 135–146)

## 2015-07-13 ENCOUNTER — Other Ambulatory Visit: Payer: Self-pay | Admitting: Physician Assistant

## 2015-07-13 ENCOUNTER — Telehealth: Payer: Self-pay | Admitting: Cardiology

## 2015-07-13 MED FILL — GABAPENTIN 600 MG TABLET: 600 | 30 days supply | Qty: 90 | Fill #0

## 2015-07-13 MED FILL — raNITIdine HCL 150 MG TABS: 150 | 30 days supply | Qty: 60 | Fill #1

## 2015-07-13 NOTE — Telephone Encounter (Signed)
Rx request to pharmacy/SLS  

## 2015-07-13 NOTE — Telephone Encounter (Signed)
Would try compression hose, keep feet elevated Olga MillersBrian Crenshaw

## 2015-07-13 NOTE — Telephone Encounter (Signed)
New message      Pt c/o medication issue:  1. Name of Medication: furosemide 2. How are you currently taking this medication (dosage and times per day)? 20mg  daily 3. Are you having a reaction (difficulty breathing--STAT)? no 4. What is your medication issue? Pt is calling to report that since starting fluid pill, swelling in feet has not changed.  Please advise

## 2015-07-13 NOTE — Telephone Encounter (Signed)
Pt calling to let Dr Jens Somrenshaw know that his ankles/feet are still swelling.  They have not changed since he saw Dr Jens Somrenshaw on 07/01/15 and started on furosemide but the swelling has not decreased. Pt denies SOB, CP or palpitations. He said he walks a mile a day and has no problem with that. He does not check his BP at home.  Will route to Dr Jens Somrenshaw and Deliah Goodyebra Mathis. Please advise.

## 2015-07-14 NOTE — Telephone Encounter (Signed)
Spoke with pt, Aware of dr crenshaw's recommendations.  °

## 2015-07-15 ENCOUNTER — Telehealth: Payer: Self-pay | Admitting: Physician Assistant

## 2015-07-15 DIAGNOSIS — M65322 Trigger finger, left index finger: Secondary | ICD-10-CM

## 2015-07-15 NOTE — Telephone Encounter (Signed)
Patient informed, agreed to hand specialist; referral placed/SLS 05/03

## 2015-07-15 NOTE — Telephone Encounter (Signed)
Relation to NW:GNFApt:self Call back number:[phone]   Reason for call:  Patient requesting a referral for occupational therapy [facility] for left pointer finger, patient states it "clicks" and from time to time he has to pop it back in. Please advise

## 2015-07-20 MED FILL — ISOSORBIDE MN ER 60 MG TAB: 60 | 30 days supply | Qty: 30 | Fill #4

## 2015-07-20 MED FILL — LISINOPRIL 10 MG TABLET: 10 | 30 days supply | Qty: 30 | Fill #7

## 2015-07-20 MED FILL — ZOLPIDEM TART ER 12.5 MG TA: 12.5 | 30 days supply | Qty: 30 | Fill #2

## 2015-07-21 ENCOUNTER — Encounter: Payer: Self-pay | Admitting: Physician Assistant

## 2015-07-21 ENCOUNTER — Ambulatory Visit (INDEPENDENT_AMBULATORY_CARE_PROVIDER_SITE_OTHER): Payer: Medicare Other | Admitting: Physician Assistant

## 2015-07-21 VITALS — BP 128/70 | HR 71 | Temp 97.8°F | Resp 16 | Ht 66.0 in | Wt 187.2 lb

## 2015-07-21 DIAGNOSIS — Z0271 Encounter for disability determination: Secondary | ICD-10-CM

## 2015-07-21 DIAGNOSIS — E1121 Type 2 diabetes mellitus with diabetic nephropathy: Secondary | ICD-10-CM

## 2015-07-21 DIAGNOSIS — Z794 Long term (current) use of insulin: Secondary | ICD-10-CM | POA: Diagnosis not present

## 2015-07-21 NOTE — Patient Instructions (Signed)
We will get all of your records to send in with disability paperwork. I will call you once completed.  You will be contacted by Podiatry for an appointment.

## 2015-07-21 NOTE — Progress Notes (Signed)
Pre visit review using our clinic review tool, if applicable. No additional management support is needed unless otherwise documented below in the visit note/SLS  

## 2015-07-21 NOTE — Progress Notes (Signed)
Patient presents to clinic today to discuss renewal of long-term disability. Patient endorses receiving forms in the mail that need completion to continue disability. Patient with significant medical history including DM I requiring need for insulin pump with severe polyneuropathy causing numbness of hands/feet along with chronic debilitating pain, worse in colder months. Patient also with significant history of CAD s/p multiple MI in previous years (most recently in January 2017 requiring prolonged hospitalization). Also suffers from OSA and chronic orthostatic hypotension. Is currently on CPAP therapy for OSA. Is currently on midodrine for orthostatic hypotension. Patient is followed by Cardiology, Pulmonology, Endocrinology, Podiatry and Ophthalmology. Has not worked in several years due to disability. Ambulates with the assistance of a cane. Lives alone but has family coming by daily to assist in household chores. Is able to bath and dress himself although this takes a large amount of his limited energy, as well as substantial amount of time. Endorses taking 1.5 hours just to shower and dress om his own. Is unable to drive. Is mentally fit and able to manage finances.  Past Medical History  Diagnosis Date  . Diabetes type 1, uncontrolled ([place])   . Frequent headaches   . Heart attack ([place]) 09.13.2014    x5  . Hyperlipidemia   . Chicken pox   . Mumps   . Measles, [place] (rubella)   . Scarlet fever   . Diabetic retinopathy ([place])   . Cataracts, bilateral   . Orthostatic hypotension   . CAD (coronary artery disease)   . Contrast dye induced nephropathy   . OSA (obstructive sleep apnea) 07/17/2013    Current Outpatient Prescriptions on File Prior to Visit  Medication Sig Dispense Refill  . acetaminophen (TYLENOL) 325 MG tablet Take 650 mg by mouth every 6 (six) hours as needed for mild pain.    Marland Kitchen aspirin 81 MG tablet Take 81 mg by mouth daily.    . clopidogrel (PLAVIX) 75 MG tablet Take 1  tablet (75 mg total) by mouth daily. 90 tablet 3  . co-enzyme Q-10 30 MG capsule Take 30 mg by mouth daily.     . fludrocortisone (FLORINEF) 0.1 MG tablet TAKE 1 TABLET BY MOUTH TWICE DAILY 60 tablet 2  . furosemide (LASIX) 20 MG tablet Take 1 tablet (20 mg total) by mouth daily. 90 tablet 3  . gabapentin (NEURONTIN) 600 MG tablet TAKE 1 TABLET BY MOUTH 3 TIMES DAILY. 90 tablet 2  . glucagon 1 MG injection Inject 1 mg into the vein once as needed (Emergency Test Kit). Reported on 04/07/2015    . glucose blood test strip 1 each by Other route as directed. Use as instructed FREESTYLE IN VITRO    . HYDROcodone-acetaminophen (NORCO/VICODIN) 5-325 MG tablet Take 1 tablet by mouth every 6 (six) hours as needed for severe pain. 60 tablet 0  . insulin lispro (HUMALOG) 100 UNIT/ML injection Inject 1-40 Units into the skin as directed. Use in Insulin Pump, Max daily dose: 100 units/day    . isosorbide mononitrate (IMDUR) 60 MG 24 hr tablet Take 1 tablet (60 mg total) by mouth daily. 30 tablet 11  . lisinopril (PRINIVIL,ZESTRIL) 10 MG tablet Take 10 mg by mouth daily.  11  . meclizine (ANTIVERT) 12.5 MG tablet Take 1 tablet (12.5 mg total) by mouth 3 (three) times daily as needed for dizziness. 30 tablet 0  . midodrine (PROAMATINE) 10 MG tablet TAKE 1 TABLET BY MOUTH TWICE DAILY 60 tablet 2  . nitroGLYCERIN (NITROSTAT) 0.4 MG SL  tablet Place 1 tablet (0.4 mg total) under the tongue every 5 (five) minutes x 3 doses as needed for chest pain. 25 tablet 12  . ranitidine (ZANTAC) 150 MG tablet TAKE 1 TABLET (150 MG TOTAL) BY MOUTH 2 (TWO) TIMES DAILY. 60 tablet 1  . rosuvastatin (CRESTOR) 40 MG tablet Take 1 tablet (40 mg total) by mouth daily. 90 tablet 3  . SENEXON-S 8.6-50 MG tablet TAKE 1 TABLET BY MOUTH DAILY. 100 tablet 1  . zolpidem (AMBIEN CR) 12.5 MG CR tablet Take 1 tablet (12.5 mg total) by mouth at bedtime. 30 tablet 2   No current facility-administered medications on file prior to visit.    No  Known Allergies  Family History  Problem Relation Age of Onset  . Vascular Disease Father 53    Deceased  . Transient ischemic attack Father   . Hypertension Mother 63    Deceased  . Liver cancer Paternal Grandfather   . Breast cancer Sister   . Heart disease Sister     Ablation for rhythm disturbance  . Healthy Brother     x1  . Other Mother     s/p PPM    Social History   Social History  . Marital Status: Married    Spouse Name: N/A  . Number of Children: 1  . Years of Education: N/A   Social History Main Topics  . Smoking status: Never Smoker   . Smokeless tobacco: None  . Alcohol Use: Yes     Comment: Rare  . Drug Use: No  . Sexual Activity: Not Asked   Other Topics Concern  . 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  They have one grown daughter.   Highest level of education:  Master's Degree    Review of Systems - See HPI.  All other ROS are negative.  BP 128/70 mmHg  Pulse 71  Temp(Src) 97.8 F (36.6 C) (Oral)  Resp 16  Ht _0  (1.676 m)  Wt 187 lb 4 oz (84.936 kg)  BMI 30.24 kg/m2  SpO2 99%  Physical Exam  Constitutional: He is oriented to person, place, and time and well-developed, well-nourished, and in no distress.  HENT:  Head: Normocephalic and atraumatic.  Eyes: Conjunctivae are normal.  Cardiovascular: Normal rate, regular rhythm, normal heart sounds and intact distal pulses.   Pulmonary/Chest: Effort normal and breath sounds normal. No respiratory distress. He has no wheezes. He has no rales. He exhibits no tenderness.  Neurological: He is alert and oriented to person, place, and time.  Skin: Skin is warm and dry. No rash noted.  Vitals reviewed.  Diabetic Foot Form - Detailed   Diabetic Foot Exam - detailed  Diabetic Foot exam was performed with the following findings:  Yes 07/21/2015  2:00 PM  Visual Foot Exam completed.:  Yes  Is there a history of foot ulcer?:  No  Can  the patient see the bottom of their feet?:  Yes  Are the shoes appropriate in style and fit?:  Yes  Is there swelling or and abnormal foot shape?:  No  Are the toenails long?:  No  Are the toenails thick?:  No  Do you have pain in calf while walking?:  No  Is there a claw toe deformity?:  No  Is there elevated skin temparature?:  No  Is there limited skin dorsiflexion?:  No  Is there foot or ankle  muscle weakness?:  Yes  Are the toenails ingrown?:  No  Normal Range of Motion:  Yes    Pulse Foot Exam completed.:  Yes  Right posterior Tibialias:  Present Left posterior Tibialias:  Present  Right Dorsalis Pedis:  Present Left Dorsalis Pedis:  Present  Sensory Foot Exam Completed.:  Yes  Swelling:  No  Semmes-Weinstein Monofilament Test    Comments:  Monofilament testing performed. Patient unable to feel monofilament at any of standard testing sites bilaterally. Neuropathy clearly significant.       Recent Results (from the past 2160 hour(s))  Basic Metabolic Panel (BMET)     Status: Abnormal   Collection Time: 07/09/15  2:39 PM  Result Value Ref Range   Sodium 139 135 - 146 mmol/L   Potassium 4.2 3.5 - 5.3 mmol/L   Chloride 102 98 - 110 mmol/L   CO2 26 20 - 31 mmol/L   Glucose, Bld 243 (H) 65 - 99 mg/dL   BUN 22 7 - 25 mg/dL   Creat 1.28 (H) 0.70 - 1.25 mg/dL   Calcium 8.7 8.6 - 10.3 mg/dL    Assessment/Plan: Disability examination Patient clearly severely physically disabled. Good mental capacity. Patient is unable to work giving multiple health issues. Has no sensation in feet so he is high risk for falls. Has very limited sensation in hands which makes any manual task extremely daunting. Also requires insulin monitoring around the clock. Will complete disability paperwork for patient and provide the requested records to continue his disability income.

## 2015-07-26 DIAGNOSIS — Z0271 Encounter for disability determination: Secondary | ICD-10-CM | POA: Insufficient documentation

## 2015-07-26 NOTE — Assessment & Plan Note (Signed)
Patient clearly severely physically disabled. Good mental capacity. Patient is unable to work giving multiple health issues. Has no sensation in feet so he is high risk for falls. Has very limited sensation in hands which makes any manual task extremely daunting. Also requires insulin monitoring around the clock. Will complete disability paperwork for patient and provide the requested records to continue his disability income.

## 2015-08-07 ENCOUNTER — Telehealth: Payer: Self-pay | Admitting: *Deleted

## 2015-08-07 NOTE — Telephone Encounter (Signed)
Forwarded to Cody/Sharon. JG//CMA 

## 2015-08-11 ENCOUNTER — Other Ambulatory Visit: Payer: Self-pay | Admitting: Physician Assistant

## 2015-08-11 MED FILL — raNITIdine HCL 150 MG TABS: 150 | 30 days supply | Qty: 60 | Fill #0

## 2015-08-11 MED FILL — FLUDROCORTISONE 0.1 MG TAB: 0.1 | 30 days supply | Qty: 60 | Fill #0

## 2015-08-11 MED FILL — MIDODRINE HCL 10 MG TABLET: 10 | 30 days supply | Qty: 60 | Fill #0

## 2015-08-11 NOTE — Telephone Encounter (Signed)
Refill sent per [facility] refill protocol/SLS  

## 2015-08-12 MED FILL — POTASSIUM CITRATE ER 15 MEQ: 15 MEQ | 30 days supply | Qty: 60 | Fill #0

## 2015-08-18 ENCOUNTER — Other Ambulatory Visit: Payer: Self-pay | Admitting: Physician Assistant

## 2015-08-18 MED FILL — ZOLPIDEM TART ER 12.5 MG TA: 12.5 | 30 days supply | Qty: 30 | Fill #0

## 2015-08-18 MED FILL — LISINOPRIL 10 MG TABLET: 10 | 30 days supply | Qty: 30 | Fill #8

## 2015-08-18 MED FILL — GABAPENTIN 600 MG TABLET: 600 | 30 days supply | Qty: 90 | Fill #1

## 2015-08-18 MED FILL — ISOSORBIDE MN ER 60 MG TAB: 60 | 30 days supply | Qty: 30 | Fill #5

## 2015-08-18 NOTE — Telephone Encounter (Signed)
[facility]ribe request [facility] for refill on Zolpidem Last filled - 05/22/15, #30x2 Last AEX - 0509/17 Rx [facility] to pharmacy[facility] name and number to inform patient/SLS 06/06

## 2015-08-19 ENCOUNTER — Other Ambulatory Visit: Payer: Self-pay | Admitting: Podiatry

## 2015-08-19 ENCOUNTER — Ambulatory Visit (HOSPITAL_BASED_OUTPATIENT_CLINIC_OR_DEPARTMENT_OTHER)
Admission: RE | Admit: 2015-08-19 | Discharge: 2015-08-19 | Disposition: A | Discharge status: Home / Self Care | Payer: Medicare Other | Source: Ambulatory Visit | Attending: Podiatry | Admitting: Podiatry

## 2015-08-19 ENCOUNTER — Ambulatory Visit (INDEPENDENT_AMBULATORY_CARE_PROVIDER_SITE_OTHER): Payer: Medicare Other | Admitting: Podiatry

## 2015-08-19 ENCOUNTER — Encounter: Payer: Self-pay | Admitting: Podiatry

## 2015-08-19 VITALS — BP 130/47 | HR 74 | Resp 18

## 2015-08-19 DIAGNOSIS — M79674 Pain in right toe(s): Secondary | ICD-10-CM | POA: Diagnosis not present

## 2015-08-19 DIAGNOSIS — R52 Pain, unspecified: Secondary | ICD-10-CM | POA: Diagnosis present

## 2015-08-19 DIAGNOSIS — S9032XA Contusion of left foot, initial encounter: Secondary | ICD-10-CM | POA: Diagnosis not present

## 2015-08-19 DIAGNOSIS — B351 Tinea unguium: Secondary | ICD-10-CM | POA: Diagnosis not present

## 2015-08-19 DIAGNOSIS — M778 Other enthesopathies, not elsewhere classified: Secondary | ICD-10-CM | POA: Diagnosis not present

## 2015-08-19 DIAGNOSIS — M79675 Pain in left toe(s): Secondary | ICD-10-CM

## 2015-08-19 NOTE — Progress Notes (Signed)
   Subjective:    Patient ID: Timothy Alvarez, male    DOB: [birth year], 60 y.o.   MRN: [id number]  HPI  60 year old male presents the office today for concerns of thick, painful, elongated toenails and discuss treatment for nail fungus and he cannot cut the nails himself in the causing irritation in shoes.. Denies any swelling redness or drainage. This has been ongoing for several years. His other concerns today other than his left foot is been swollen. This has been on going for about 2 weeks when he was in [place]. He denies any recent injury or trauma however when he woke up it is his foot was swollen and bruised. The with a he can remember that may have started this as he was doing reflexology and there was some discomfort during this treatment. Over the last 2 weeks he has noticed improvement in symptoms although he still does have some discomfort to his left foot however he cannot isolate a specific area. He denies any claudication symptoms. He has neuropathy.  Review of Systems  All other systems reviewed and are negative.      Objective:   Physical Exam General: AAO x3, NAD  Dermatological: Nails are hypertrophic, dystrophic, discolored, brittle 10. No surrounding redness or drainage. Particularly left one, 2, 3 nails are the worst. These nails are also somewhat loose distally. There is no signs of infection to the toenails at this time. There is no open lesions or pre-ulcerative lesions.  Vascular: Dorsalis Pedis artery and Posterior Tibial artery pedal pulses are 1/4 bilateral with immedate capillary fill time. There is no pain with calf compression, swelling, warmth, erythema.   Neruologic: Decreased sensation with Dorann OuSimms Weinstein monofilament, decreased vibratory sensation.  Musculoskeletal: There is mild edema to the left foot and ankle with no significant area pinpoint tenderness however he does have neuropathy. Range of motion appears to be intact. MMT 5/5.  Gait: Unassisted,  Nonantalgic.      Assessment & Plan:  Symptomatic onychomycosis, left ankle, foot swelling -Treatment options discussed including all alternatives, risks, and complications -Etiology of symptoms were discussed -X-rays ordered of left foot and ankle. Fracture unlikely given no history of injury however since he has neuropathy unable to determine specific area of pain all up he reports just generalized pain to the left ankle and foot at times mostly with walking. -Nails debrided 10 without complications or bleeding. I discussed her options for onychomycosis and he does want to proceed with topical treatment. This was ordered today as a compound cream. -Follow-up in 3 months now call him with results the x-rays. If the symptoms to the left foot are not improved within 2 weeks encouraged follow-up.  Ovid CurdMatthew Wagoner, DPM

## 2015-08-19 NOTE — Patient Instructions (Signed)
Diabetes and Foot Care Diabetes may cause you to have problems because of poor blood supply (circulation) to your feet and legs. This may cause the skin on your feet to become thinner, break easier, and heal more slowly. Your skin may become dry, and the skin may peel and crack. You may also have nerve damage in your legs and feet causing decreased feeling in them. You may not notice minor injuries to your feet that could lead to infections or more serious problems. Taking care of your feet is one of the most important things you can do for yourself.  HOME CARE INSTRUCTIONS  Wear shoes at all times, even in the house. Do not go barefoot. Bare feet are easily injured.  Check your feet daily for blisters, cuts, and redness. If you cannot see the bottom of your feet, use a mirror or ask someone for help.  Wash your feet with warm water (do not use hot water) and mild soap. Then pat your feet and the areas between your toes until they are completely dry. Do not soak your feet as this can dry your skin.  Apply a moisturizing lotion or petroleum jelly (that does not contain alcohol and is unscented) to the skin on your feet and to dry, brittle toenails. Do not apply lotion between your toes.  Trim your toenails straight across. Do not dig under them or around the cuticle. File the edges of your nails with an emery board or nail file.  Do not cut corns or calluses or try to remove them with medicine.  Wear clean socks or stockings every day. Make sure they are not too tight. Do not wear knee-high stockings since they may decrease blood flow to your legs.  Wear shoes that fit properly and have enough cushioning. To break in new shoes, wear them for just a few hours a day. This prevents you from injuring your feet. Always look in your shoes before you put them on to be sure there are no objects inside.  Do not cross your legs. This may decrease the blood flow to your feet.  If you find a minor scrape,  cut, or break in the skin on your feet, keep it and the skin around it clean and dry. These areas may be cleansed with mild soap and water. Do not cleanse the area with peroxide, alcohol, or iodine.  When you remove an adhesive bandage, be sure not to damage the skin around it.  If you have a wound, look at it several times a day to make sure it is healing.  Do not use heating pads or hot water bottles. They may burn your skin. If you have lost feeling in your feet or legs, you may not know it is happening until it is too late.  Make sure your health care provider performs a complete foot exam at least annually or more often if you have foot problems. Report any cuts, sores, or bruises to your health care provider immediately. SEEK MEDICAL CARE IF:   You have an injury that is not healing.  You have cuts or breaks in the skin.  You have an ingrown nail.  You notice redness on your legs or feet.  You feel burning or tingling in your legs or feet.  You have pain or cramps in your legs and feet.  Your legs or feet are numb.  Your feet always feel cold. SEEK IMMEDIATE MEDICAL CARE IF:   There is increasing redness,   swelling, or pain in or around a wound.  There is a red line that goes up your leg.  Pus is coming from a wound.  You develop a fever or as directed by your health care provider.  You notice a bad smell coming from an ulcer or wound.   This information is not intended to replace advice given to you by your health care provider. Make sure you discuss any questions you have with your health care provider.   Document Released: 02/26/2000 Document Revised: 10/31/2012 Document Reviewed: 08/07/2012 Elsevier Interactive Patient Education 2016 [employer]  

## 2015-08-21 ENCOUNTER — Telehealth: Payer: Self-pay | Admitting: *Deleted

## 2015-08-21 NOTE — Telephone Encounter (Addendum)
-----   Message from Vivi BarrackMatthew R Wagoner, DPM sent at 08/19/2015  8:00 PM EDT ----- Val- can you let him know the results of his x-rays. There is nothing acute. Has some arthritis and and old fracture but it is healed. If his left foot pain or swelling is not better in 2 weeks to let us know. Thanks. Left message requesting callback to inform of results. 08/21/2015-Pt returned my call, and I informed him of Dr. Gabriel RungWagoner's review of results and orders.  Pt states understanding.

## 2015-08-25 ENCOUNTER — Encounter (HOSPITAL_BASED_OUTPATIENT_CLINIC_OR_DEPARTMENT_OTHER): Payer: Self-pay | Admitting: Emergency Medicine

## 2015-08-25 ENCOUNTER — Emergency Department (HOSPITAL_BASED_OUTPATIENT_CLINIC_OR_DEPARTMENT_OTHER)
Admission: EM | Admit: 2015-08-25 | Discharge: 2015-08-25 | Disposition: A | Discharge status: Home / Self Care | Payer: Medicare Other | Attending: Emergency Medicine | Admitting: Emergency Medicine

## 2015-08-25 ENCOUNTER — Emergency Department (HOSPITAL_BASED_OUTPATIENT_CLINIC_OR_DEPARTMENT_OTHER): Payer: Medicare Other

## 2015-08-25 DIAGNOSIS — Z794 Long term (current) use of insulin: Secondary | ICD-10-CM | POA: Diagnosis not present

## 2015-08-25 DIAGNOSIS — I2510 Atherosclerotic heart disease of native coronary artery without angina pectoris: Secondary | ICD-10-CM | POA: Insufficient documentation

## 2015-08-25 DIAGNOSIS — Z79899 Other long term (current) drug therapy: Secondary | ICD-10-CM | POA: Insufficient documentation

## 2015-08-25 DIAGNOSIS — Z7982 Long term (current) use of aspirin: Secondary | ICD-10-CM | POA: Insufficient documentation

## 2015-08-25 DIAGNOSIS — E785 Hyperlipidemia, unspecified: Secondary | ICD-10-CM | POA: Diagnosis not present

## 2015-08-25 DIAGNOSIS — M79672 Pain in left foot: Secondary | ICD-10-CM | POA: Insufficient documentation

## 2015-08-25 DIAGNOSIS — E109 Type 1 diabetes mellitus without complications: Secondary | ICD-10-CM | POA: Insufficient documentation

## 2015-08-25 NOTE — Discharge Instructions (Signed)

## 2015-08-25 NOTE — ED Notes (Signed)
Patient walked to room 5 and given a blanket.

## 2015-08-25 NOTE — ED Provider Notes (Signed)
CSN: [id number]     Arrival date & time 08/25/15  0854 History   First MD Initiated Contact with Patient 08/25/15 [phone]     Chief Complaint  Patient presents with  . Foot Pain    Timothy Alvarez is a 60 y.o. male who presents to the emergency department complaining of left foot pain after an injury last night. The patient reports he was walking at home when he missed steps due to a dog toy and felt a sudden pain in across the arch of his left foot. He reports he has pain to the dorsum of his left foot to the medial and lateral aspect of his arch. He complains of 6 out of 10 pain currently that is worse with ambulating. He has taken hydrocodone for treatment of his pain today. Patient is a diabetic and has a history of diabetic neuropathy. He denies any new numbness or tingling. No weakness. He reports his blood sugars been well controlled. He reports a blood sugar of 130 this morning. He is followed by podiatrist Dr. [name] No fevers, weakness, open wounds or rashes.  The history is provided by the patient. No language interpreter was used.    Past Medical History  Diagnosis Date  . Diabetes type 1, uncontrolled (HCC)   . Frequent headaches   . Heart attack (HCC) 09.13.2014    x5  . Hyperlipidemia   . Chicken pox   . Mumps   . Measles, [place] (rubella)   . Scarlet fever   . Diabetic retinopathy (HCC)   . Cataracts, bilateral   . Orthostatic hypotension   . CAD (coronary artery disease)   . Contrast dye induced nephropathy   . OSA (obstructive sleep apnea) 07/17/2013   Past Surgical History  Procedure Laterality Date  . Coronary angioplasty with stent placement    . Appendectomy  1966  . Wisdom tooth extraction    . Refractive surgery      Retinopathy  . Cardiac catheterization N/A 03/18/2015    Procedure: Left Heart Cath and Coronary Angiography;  Surgeon: Lyn Records, MD;  Location: [facility] INVASIVE CV LAB;  Service: Cardiovascular;  Laterality: N/A;   Family History  Problem  Relation Age of Onset  . Vascular Disease Father 5    Deceased  . Transient ischemic attack Father   . Hypertension Mother 87    Deceased  . Liver cancer Paternal Grandfather   . Breast cancer Sister   . Heart disease Sister     Ablation for rhythm disturbance  . Healthy Brother     x1  . Other Mother     s/p PPM   Social History  Substance Use Topics  . Smoking status: Never Smoker   . Smokeless tobacco: None  . Alcohol Use: Yes     Comment: Rare    Review of Systems  Constitutional: Negative for fever.  Cardiovascular: Negative for leg swelling.  Musculoskeletal: Positive for arthralgias.  Skin: Negative for color change, rash and wound.  Neurological: Negative for weakness and numbness.      Allergies  Review of patient's allergies indicates no known allergies.  Home Medications   Prior to Admission medications   Medication Sig Start Date End Date Taking? Authorizing Provider  HYDROcodone-acetaminophen (NORCO/VICODIN) 5-325 MG tablet Take 1 tablet by mouth every 6 (six) hours as needed for severe pain. 04/22/15  Yes Waldon Merl, PA-C  acetaminophen (TYLENOL) 325 MG tablet Take 650 mg by mouth every 6 (six) hours as  needed for mild pain.    Historical Provider, MD  aspirin 81 MG tablet Take 81 mg by mouth daily.    Historical Provider, MD  clopidogrel (PLAVIX) 75 MG tablet Take 1 tablet (75 mg total) by mouth daily. 03/27/15   Erlene Quan, PA-C  co-enzyme Q-10 30 MG capsule Take 30 mg by mouth daily.     Historical Provider, MD  fludrocortisone (FLORINEF) 0.1 MG tablet TAKE 1 TABLET BY MOUTH TWICE DAILY 07/13/15   Brunetta Jeans, PA-C  furosemide (LASIX) 20 MG tablet Take 1 tablet (20 mg total) by mouth daily. 07/01/15   Lelon Perla, MD  gabapentin (NEURONTIN) 600 MG tablet TAKE 1 TABLET BY MOUTH 3 TIMES DAILY. 07/13/15   Brunetta Jeans, PA-C  glucagon 1 MG injection Inject 1 mg into the vein once as needed (Emergency Test Kit). Reported on 04/07/2015     Historical Provider, MD  glucose blood test strip 1 each by Other route as directed. Use as instructed FREESTYLE IN VITRO    Historical Provider, MD  insulin lispro (HUMALOG) 100 UNIT/ML injection Inject 1-40 Units into the skin as directed. Use in Insulin Pump, Max daily dose: 100 units/day    Historical Provider, MD  isosorbide mononitrate (IMDUR) 60 MG 24 hr tablet Take 1 tablet (60 mg total) by mouth daily. 03/21/15   Bhavinkumar Bhagat, PA  lisinopril (PRINIVIL,ZESTRIL) 10 MG tablet Take 10 mg by mouth daily. 03/13/15   Historical Provider, MD  meclizine (ANTIVERT) 12.5 MG tablet Take 1 tablet (12.5 mg total) by mouth 3 (three) times daily as needed for dizziness. 02/27/14   Percell Miller Saguier, PA-C  midodrine (PROAMATINE) 10 MG tablet TAKE 1 TABLET BY MOUTH TWICE DAILY 08/11/15   Brunetta Jeans, PA-C  nitroGLYCERIN (NITROSTAT) 0.4 MG SL tablet Place 1 tablet (0.4 mg total) under the tongue every 5 (five) minutes x 3 doses as needed for chest pain. 03/20/15   Bhavinkumar Bhagat, PA  ranitidine (ZANTAC) 150 MG tablet TAKE 1 TABLET (150 MG TOTAL) BY MOUTH 2 (TWO) TIMES DAILY. 08/11/15   Brunetta Jeans, PA-C  rosuvastatin (CRESTOR) 40 MG tablet Take 1 tablet (40 mg total) by mouth daily. 07/01/15   Lelon Perla, MD  SENEXON-S 8.6-50 MG tablet TAKE 1 TABLET BY MOUTH DAILY. 02/13/15   Brunetta Jeans, PA-C  zolpidem (AMBIEN CR) 12.5 MG CR tablet TAKE 1 TABLET BY MOUTH AT BEDTIME 08/18/15   Brunetta Jeans, PA-C   BP 169/85 mmHg  Pulse 80  Temp(Src) 98.6 F (37 C) (Oral)  Resp 18  Ht 5' 6"  (1.676 m)  Wt 86.183 kg  BMI 30.68 kg/m2  SpO2 100% Physical Exam  Constitutional: He appears well-developed and well-nourished. No distress.  HENT:  Head: Normocephalic and atraumatic.  Eyes: Right eye exhibits no discharge. Left eye exhibits no discharge.  Cardiovascular: Normal rate, regular rhythm and intact distal pulses.   Bilateral dorsalis pedis pulses are intact. Good capillary refill to his  bilateral distal to this.  Pulmonary/Chest: Effort normal. No respiratory distress.  Musculoskeletal: Normal range of motion. He exhibits tenderness. He exhibits no edema.  Mild tenderness over the dorsum of his left foot. No foot deformity, edema, erythema, ecchymosis or warmth. Good ROM of his left ankle.   Neurological: He is alert. Coordination normal.  Sensation is intact to his bilateral feet.   Skin: Skin is warm and dry. No rash noted. He is not diaphoretic. No erythema. No pallor.  Psychiatric: He has a  normal mood and affect. His behavior is normal.  Nursing note and vitals reviewed.   ED Course  Procedures (including critical care time) Labs Review Labs Reviewed - No data to display  Imaging Review Dg Foot Complete Left  08/25/2015  CLINICAL DATA:  Acute onset pain EXAM: LEFT FOOT - COMPLETE 3+ VIEW COMPARISON:  August 19, 2015 FINDINGS: Frontal, oblique, and lateral views were obtained. There is evidence of an old fracture of the fifth metatarsal with remodeling. There is no demonstrable acute fracture or dislocation. There is narrowing of all PIP and DIP joints. There is also an narrowing of the fifth MTP joint. There is a questionable old avulsion along the dorsal aspect of the distal talus, a stable finding. There is a benign exostosis along the dorsal distal talus, stable. There is a prominent spur along the posterior calcaneus, stable. No erosive change or bony destruction. There is extensive vascular calcification throughout multiple sites in the foot region. IMPRESSION: No acute fracture or dislocation. Old trauma with remodeling fifth metatarsal. Multiple areas of osteoarthritic change. Multiple foci of arterial vascular calcification. Question old trauma along the dorsal distal talus. Prominent posterior calcaneal spur. Electronically Signed   By: Lowella Grip III M.D.   On: 08/25/2015 09:40   I have personally reviewed and evaluated these images as part of my medical  decision-making.   EKG Interpretation None      Filed Vitals:   08/25/15 0858  BP: 169/85  Pulse: 80  Temp: 98.6 F (37 C)  TempSrc: Oral  Resp: 18  Height: 5' 6"  (1.676 m)  Weight: 86.183 kg  SpO2: 100%     MDM   Meds given in ED:  Medications - No data to display  New Prescriptions   No medications on file    Final diagnoses:  Left foot pain    This is a 60 y.o. male who presents to the emergency department complaining of left foot pain after an injury last night. The patient reports he was walking at home when he missed steps due to a dog toy and felt a sudden pain in across the arch of his left foot. He reports he has pain to the dorsum of his left foot to the medial and lateral aspect of his arch. He complains of 6 out of 10 pain currently that is worse with ambulating. He has taken hydrocodone for treatment of his pain today.  On exam the patient is afebrile and nontoxic appearing. He is neurovascularly intact. No foot deformity, ecchymosis, edema or warmth. Patient is ambulating without difficulty. X-ray shows no acute fracture or dislocation.  Advise the patient of these x-ray results. Patient requests Ace wrap. Will provide with Ace wrap and have him follow-up with his podiatrist Dr. Jacqualyn Posey. I discussed conservative therapies for treatment of his pain. I discussed return precautions. I advised the patient to follow-up with their primary care provider this week. I advised the patient to return to the emergency department with new or worsening symptoms or new concerns. The patient verbalized understanding and agreement with plan.      Waynetta Pean, PA-C 08/25/15 [place], MD 09/05/15 1340

## 2015-08-25 NOTE — ED Notes (Signed)
Pt states he heard left ankle pop last pm and immediately developed pain, ice applied  And elevation last pm, wrapped with ace, but pain intensified so he took ace off. This am ecchymosis noted to left anterior foot, pt drove himself to hospital

## 2015-08-27 MED FILL — FREESTYLE TEST STRIPS: 59 days supply | Qty: 200 | Fill #5

## 2015-08-28 ENCOUNTER — Telehealth: Payer: Self-pay | Admitting: Physician Assistant

## 2015-08-28 NOTE — Telephone Encounter (Signed)
Called and informed pt that records were received. Timothy BattenCody completed all forms and they have already been faxed to [facility] by me last week. Forms mailed to pt's home address per his request. [id number]

## 2015-08-28 NOTE — Telephone Encounter (Signed)
Pt states Selena BattenCody was getting documentation from Endo, Pulm, Cardiology, Opthamology, Urology, etc and was to contact pt to pick up copies so he can submit disability paperwork. Pt wants to know if we have received documentation. Pt requesting call back at [phone].

## 2015-08-31 MED FILL — COMBIGAN EYE DROPS: 0.2-0.5 | 50 days supply | Qty: 5 | Fill #0

## 2015-09-04 MED FILL — FLUDROCORTISONE 0.1 MG TAB: 0.1 | 30 days supply | Qty: 60 | Fill #1

## 2015-09-04 MED FILL — MIDODRINE HCL 10 MG TABLET: 10 | 30 days supply | Qty: 60 | Fill #1

## 2015-09-08 NOTE — Progress Notes (Signed)
HPI: FU CAD. Patient previously lived in [place]. He had his first myocardial infarction in 2010. He has had subsequent events. He moved here in May of 2014. Cardiac catheterization in September 2014 and had stent to the mid right coronary artery. There was note of a mid-90% LAD; small caliber and not suitable for PCI or grafting. Ejection fraction 60%. Procedure complicated by contrast nephropathy requiring dialysis. Carotid Dopplers February 2015 showed mild focal plaques in both carotid bifurcations but no hemodynamically significant stenosis. Also with h/o orthostatic hypotension. Last cardiac catheterization in January 2017 showed severe diffuse disease in the LAD and circumflex and diffuse distal disease in the RCA. The stent in the right coronary was patent. Echocardiogram January 2017 showed normal LV function. Patient was treated medically for his coronary disease as his targets were felt to be poor and he did not wish to pursue further evaluation. Since last seen, He has some dyspnea on exertion but no orthopnea or PND. His pedal edema has improved. He has not had chest pain or syncope.  Current Outpatient Prescriptions  Medication Sig Dispense Refill  . acetaminophen (TYLENOL) 325 MG tablet Take 650 mg by mouth every 6 (six) hours as needed for mild pain.    Marland Kitchen aspirin 81 MG tablet Take 81 mg by mouth daily.    Marland Kitchen atorvastatin (LIPITOR) 40 MG tablet Take 40 mg by mouth daily.     . clopidogrel (PLAVIX) 75 MG tablet Take 1 tablet (75 mg total) by mouth daily. 90 tablet 3  . co-enzyme Q-10 30 MG capsule Take 30 mg by mouth daily.     . fludrocortisone (FLORINEF) 0.1 MG tablet TAKE 1 TABLET BY MOUTH TWICE DAILY 60 tablet 2  . furosemide (LASIX) 20 MG tablet Take 1 tablet (20 mg total) by mouth daily. 90 tablet 3  . gabapentin (NEURONTIN) 600 MG tablet TAKE 1 TABLET BY MOUTH 3 TIMES DAILY. 90 tablet 2  . glucagon 1 MG injection Inject 1 mg into the vein once as needed (Emergency Test  Kit). Reported on 04/07/2015    . glucose blood test strip 1 each by Other route as directed. Use as instructed FREESTYLE IN VITRO    . HYDROcodone-acetaminophen (NORCO/VICODIN) 5-325 MG tablet Take 1 tablet by mouth every 6 (six) hours as needed for severe pain. 60 tablet 0  . insulin lispro (HUMALOG) 100 UNIT/ML injection Inject 1-40 Units into the skin as directed. Use in Insulin Pump, Max daily dose: 100 units/day    . isosorbide mononitrate (IMDUR) 60 MG 24 hr tablet Take 1 tablet (60 mg total) by mouth daily. 30 tablet 11  . lisinopril (PRINIVIL,ZESTRIL) 10 MG tablet Take 10 mg by mouth daily.  11  . meclizine (ANTIVERT) 12.5 MG tablet Take 1 tablet (12.5 mg total) by mouth 3 (three) times daily as needed for dizziness. 30 tablet 0  . midodrine (PROAMATINE) 10 MG tablet TAKE 1 TABLET BY MOUTH TWICE DAILY 60 tablet 2  . nitroGLYCERIN (NITROSTAT) 0.4 MG SL tablet Place 1 tablet (0.4 mg total) under the tongue every 5 (five) minutes x 3 doses as needed for chest pain. 25 tablet 12  . ranitidine (ZANTAC) 150 MG tablet TAKE 1 TABLET (150 MG TOTAL) BY MOUTH 2 (TWO) TIMES DAILY. 60 tablet 2  . SENEXON-S 8.6-50 MG tablet TAKE 1 TABLET BY MOUTH DAILY. 100 tablet 1  . zolpidem (AMBIEN CR) 12.5 MG CR tablet TAKE 1 TABLET BY MOUTH AT BEDTIME 30 tablet 2  No current facility-administered medications for this visit.     Past Medical History  Diagnosis Date  . Diabetes type 1, uncontrolled ([place])   . Frequent headaches   . Heart attack ([place]) 09.13.2014    x5  . Hyperlipidemia   . Chicken pox   . Mumps   . Measles, [place] (rubella)   . Scarlet fever   . Diabetic retinopathy ([place])   . Cataracts, bilateral   . Orthostatic hypotension   . CAD (coronary artery disease)   . Contrast dye induced nephropathy   . OSA (obstructive sleep apnea) 07/17/2013    Past Surgical History  Procedure Laterality Date  . Coronary angioplasty with stent placement    . Appendectomy  1966  . Wisdom tooth  extraction    . Refractive surgery      Retinopathy  . Cardiac catheterization N/A 03/18/2015    Procedure: Left Heart Cath and Coronary Angiography;  Surgeon: Belva Crome, MD;  Location: [place] [facility];  Service: Cardiovascular;  Laterality: N/A;    Social History   Social History  . Marital Status: Married    Spouse Name: N/A  . Number of Children: 1  . Years of Education: N/A   Occupational History  . Not on file.   Social History Main Topics  . Smoking status: Never Smoker   . Smokeless tobacco: Not on file  . Alcohol Use: Yes     Comment: Rare  . Drug Use: No  . Sexual Activity: Not on file   Other Topics Concern  . 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  They have one grown daughter.   Highest level of education:  Master's Degree    Family History  Problem Relation Age of Onset  . Vascular Disease Father 1    Deceased  . Transient ischemic attack Father   . Hypertension Mother 1    Deceased  . Liver cancer Paternal Grandfather   . Breast cancer Sister   . Heart disease Sister     Ablation for rhythm disturbance  . Healthy Brother     x1  . Other Mother     s/p PPM    ROS: Peripheral neuropathy but no fevers or chills, productive cough, hemoptysis, dysphasia, odynophagia, melena, hematochezia, dysuria, hematuria, rash, seizure activity, orthopnea, PND, claudication. Remaining systems are negative.  Physical Exam: Well-developed well-nourished in no acute distress.  Skin is warm and dry.  HEENT is normal.  Neck is supple.  Chest is clear to auscultation with normal expansion.  Cardiovascular exam is regular rate and rhythm.  Abdominal exam nontender or distended. No masses palpated. Extremities show trace edema. neuro grossly intact  Assessment and plan  1 Hyperlipidemia-continue statin. Check lipids and liver. 2 coronary artery disease-continue aspirin, Plavix and statin.  His targets are not ideal for CABG and he would not be agreeable to this regardless. We will plan medical therapy. 3 orthostatic hypotension-continue present medications. 4 edema-continue present dose of Lasix.  Kirk Ruths, MD

## 2015-09-09 ENCOUNTER — Ambulatory Visit: Payer: Medicare Other | Admitting: Podiatry

## 2015-09-10 MED FILL — raNITIdine HCL 150 MG TABS: 150 | 30 days supply | Qty: 60 | Fill #1

## 2015-09-11 MED FILL — ZOLPIDEM TART ER 12.5 MG TA: 12.5 | 30 days supply | Qty: 30 | Fill #1

## 2015-09-16 ENCOUNTER — Ambulatory Visit (INDEPENDENT_AMBULATORY_CARE_PROVIDER_SITE_OTHER): Payer: Medicare Other | Admitting: Cardiology

## 2015-09-16 ENCOUNTER — Encounter: Payer: Self-pay | Admitting: Cardiology

## 2015-09-16 VITALS — BP 131/74 | HR 68 | Ht 66.0 in | Wt 187.0 lb

## 2015-09-16 DIAGNOSIS — I2510 Atherosclerotic heart disease of native coronary artery without angina pectoris: Secondary | ICD-10-CM | POA: Diagnosis not present

## 2015-09-16 DIAGNOSIS — I951 Orthostatic hypotension: Secondary | ICD-10-CM | POA: Diagnosis not present

## 2015-09-16 DIAGNOSIS — E785 Hyperlipidemia, unspecified: Secondary | ICD-10-CM

## 2015-09-16 MED FILL — ISOSORBIDE MN ER 60 MG TAB: 60 | 30 days supply | Qty: 30 | Fill #6

## 2015-09-16 MED FILL — FUROSEMIDE 20 MG TABLET: 20 | 90 days supply | Qty: 90 | Fill #1

## 2015-09-16 MED FILL — POTASSIUM CITRATE ER 15 MEQ: 15 MEQ | 30 days supply | Qty: 60 | Fill #1

## 2015-09-16 MED FILL — ATORVASTATIN 40 MG TABLET: 40 | 90 days supply | Qty: 90 | Fill #3

## 2015-09-16 MED FILL — CLOPIDOGREL 75 MG TABLET: 75 | 90 days supply | Qty: 90 | Fill #2

## 2015-09-16 NOTE — Patient Instructions (Addendum)
Medication Instructions:   NO CHANGE  Labwork:  Your physician recommends that you return for lab work PRIOR TO EATING ON THE 2ND FLOOR WITH DR Abner GreenspanBLYTH  Follow-Up:  Your physician wants you to follow-up in:6 MONTHS  WITH DR Jens SomRENSHAW You will receive a reminder letter in the mail two months in advance. If you don't receive a letter, please call our office to schedule the follow-up appointment.   If you need a refill on your cardiac medications before your next appointment, please call your pharmacy.

## 2015-09-17 ENCOUNTER — Other Ambulatory Visit (INDEPENDENT_AMBULATORY_CARE_PROVIDER_SITE_OTHER): Payer: Medicare Other

## 2015-09-17 ENCOUNTER — Emergency Department (HOSPITAL_BASED_OUTPATIENT_CLINIC_OR_DEPARTMENT_OTHER): Payer: Medicare Other

## 2015-09-17 ENCOUNTER — Encounter (HOSPITAL_BASED_OUTPATIENT_CLINIC_OR_DEPARTMENT_OTHER): Payer: Self-pay | Admitting: Emergency Medicine

## 2015-09-17 ENCOUNTER — Emergency Department (HOSPITAL_BASED_OUTPATIENT_CLINIC_OR_DEPARTMENT_OTHER)
Admission: EM | Admit: 2015-09-17 | Discharge: 2015-09-17 | Disposition: A | Discharge status: Home / Self Care | Payer: Medicare Other | Attending: Emergency Medicine | Admitting: Emergency Medicine

## 2015-09-17 DIAGNOSIS — E10319 Type 1 diabetes mellitus with unspecified diabetic retinopathy without macular edema: Secondary | ICD-10-CM | POA: Insufficient documentation

## 2015-09-17 DIAGNOSIS — E785 Hyperlipidemia, unspecified: Secondary | ICD-10-CM

## 2015-09-17 DIAGNOSIS — L089 Local infection of the skin and subcutaneous tissue, unspecified: Secondary | ICD-10-CM | POA: Diagnosis not present

## 2015-09-17 DIAGNOSIS — I2510 Atherosclerotic heart disease of native coronary artery without angina pectoris: Secondary | ICD-10-CM | POA: Diagnosis not present

## 2015-09-17 LAB — LIPID PANEL
CHOL/HDL RATIO: 3
Cholesterol: 104 mg/dL (ref 0–200)
HDL: 40.7 mg/dL (ref >39.00)
LDL Cholesterol: 42 mg/dL (ref 0–99)
NONHDL: 62.88
Triglycerides: 103 mg/dL (ref 0.0–149.0)
VLDL: 20.6 mg/dL (ref 0.0–40.0)

## 2015-09-17 LAB — CBC WITH DIFFERENTIAL/PLATELET
BASOS ABS: 0 10*3/uL (ref 0.0–0.1)
BASOS PCT: 1 %
EOS ABS: 0.3 10*3/uL (ref 0.0–0.7)
EOS PCT: 4 %
HCT: 40.4 % (ref 39.0–52.0)
Hemoglobin: 13.8 g/dL (ref 13.0–17.0)
LYMPHS PCT: 21 %
Lymphs Abs: 1.5 10*3/uL (ref 0.7–4.0)
MCH: 28.8 pg (ref 26.0–34.0)
MCHC: 34.2 g/dL (ref 30.0–36.0)
MCV: 84.3 fL (ref 78.0–100.0)
Monocytes Absolute: 0.6 10*3/uL (ref 0.1–1.0)
Monocytes Relative: 9 %
Neutro Abs: 4.6 10*3/uL (ref 1.7–7.7)
Neutrophils Relative %: 65 %
PLATELETS: 276 10*3/uL (ref 150–400)
RBC: 4.79 MIL/uL (ref 4.22–5.81)
RDW: 12.8 % (ref 11.5–15.5)
WBC: 7 10*3/uL (ref 4.0–10.5)

## 2015-09-17 LAB — BASIC METABOLIC PANEL
ANION GAP: 8 (ref 5–15)
BUN: 16 mg/dL (ref 6–20)
CALCIUM: 8.7 mg/dL — AB (ref 8.9–10.3)
CO2: 27 mmol/L (ref 22–32)
Chloride: 105 mmol/L (ref 101–111)
Creatinine, Ser: 1.29 mg/dL — ABNORMAL HIGH (ref 0.61–1.24)
GFR, EST NON AFRICAN AMERICAN: 58 mL/min — AB (ref >60)
GLUCOSE: 196 mg/dL — AB (ref 65–99)
POTASSIUM: 3.7 mmol/L (ref 3.5–5.1)
SODIUM: 140 mmol/L (ref 135–145)

## 2015-09-17 LAB — HEPATIC FUNCTION PANEL
ALBUMIN: 3.6 g/dL (ref 3.5–5.2)
ALT: 60 U/L — ABNORMAL HIGH (ref 0–53)
AST: 40 U/L — AB (ref 0–37)
Alkaline Phosphatase: 162 U/L — ABNORMAL HIGH (ref 39–117)
Bilirubin, Direct: 0.1 mg/dL (ref 0.0–0.3)
Total Bilirubin: 0.5 mg/dL (ref 0.2–1.2)
Total Protein: 6.6 g/dL (ref 6.0–8.3)

## 2015-09-17 LAB — CBG MONITORING, ED: GLUCOSE-CAPILLARY: 184 mg/dL — AB (ref 65–99)

## 2015-09-17 MED ORDER — SODIUM CHLORIDE 0.9 % IV BOLUS (SEPSIS)
250.0000 mL | Freq: Once | INTRAVENOUS | Status: AC
  Administered 2015-09-17: 250 mL via INTRAVENOUS

## 2015-09-17 MED ORDER — SODIUM CHLORIDE 0.9 % IV SOLN
INTRAVENOUS | Status: DC
  Administered 2015-09-17: 22:00:00 via INTRAVENOUS

## 2015-09-17 MED ORDER — VANCOMYCIN HCL IN DEXTROSE 1-5 GM/200ML-% IV SOLN
1000.0000 mg | Freq: Once | INTRAVENOUS | Status: AC
  Administered 2015-09-17: 1000 mg via INTRAVENOUS
  Filled 2015-09-17: qty 200

## 2015-09-17 MED ORDER — DOXYCYCLINE HYCLATE 100 MG PO CAPS: 100.0000 mg | ORAL_CAPSULE | Freq: Two times a day (BID) | ORAL | Status: DC

## 2015-09-17 NOTE — ED Notes (Signed)
Pt states left foot is infected.  Blood and pus started coming out in shower

## 2015-09-17 NOTE — ED Provider Notes (Signed)
CSN: [id number]     Arrival date & time 09/17/15  2027 History   By signing my name below, I, Maud Deed. Royston Sinner, attest that this documentation has been prepared under the direction and in the presence of Fredia Sorrow, MD.  Electronically Signed: Maud Deed. Royston Sinner, ED Scribe. 09/17/2015. 10:09 PM.   Chief Complaint  Patient presents with  . Foot Pain   The history is provided by the patient. No language interpreter was used.    HPI Comments: Timothy Alvarez is a 60 y.o. male with a PMHx of DM and hyperlipidemia who presents to the Emergency Department complaining of constant, unchanged L foot pain and possible "infection" to bottom of the L foot noted earlier this evening. Pt was evaluated in the Emergency Department on 6/13 s/p L foot injury. At time of discharge pt requested an ACE bandage to wrap his foot. As pt was removing the bandages prior to getting in the shower this evening, he noted blood and pus coming from the bottom of his foot along with prolonged indentation to the bottom of the foot. No interventions attempted prior to arrival. No recent fever, chills, nausea, vomiting, chest pain, or shortness of breath. No known allergies to medications.  PCP: Leeanne Rio, PA-C  and Cristela Blue, MD PODIATRIST: Tresa Res, MD  Past Medical History  Diagnosis Date  . Diabetes type 1, uncontrolled ([place])   . Frequent headaches   . Heart attack ([place]) 09.13.2014    x5  . Hyperlipidemia   . Chicken pox   . Mumps   . Measles, [place] (rubella)   . Scarlet fever   . Diabetic retinopathy ([place])   . Cataracts, bilateral   . Orthostatic hypotension   . CAD (coronary artery disease)   . Contrast dye induced nephropathy   . OSA (obstructive sleep apnea) 07/17/2013   Past Surgical History  Procedure Laterality Date  . Coronary angioplasty with stent placement    . Appendectomy  1966  . Wisdom tooth extraction    . Refractive surgery      Retinopathy  . Cardiac catheterization N/A  03/18/2015    Procedure: Left Heart Cath and Coronary Angiography;  Surgeon: Belva Crome, MD;  Location: [facility];  Service: Cardiovascular;  Laterality: N/A;   Family History  Problem Relation Age of Onset  . Vascular Disease Father 23    Deceased  . Transient ischemic attack Father   . Hypertension Mother 47    Deceased  . Liver cancer Paternal Grandfather   . Breast cancer Sister   . Heart disease Sister     Ablation for rhythm disturbance  . Healthy Brother     x1  . Other Mother     s/p PPM   Social History  Substance Use Topics  . Smoking status: Never Smoker   . Smokeless tobacco: None  . Alcohol Use: Yes     Comment: Rare    Review of Systems  Constitutional: Negative for fever and chills.  HENT: Negative for rhinorrhea and sore throat.   Eyes: Negative for visual disturbance.  Respiratory: Negative for cough and shortness of breath.   Cardiovascular: Negative for chest pain and leg swelling.  Gastrointestinal: Negative for nausea, vomiting, abdominal pain and diarrhea.  Genitourinary: Negative for dysuria.  Musculoskeletal: Negative for back pain and neck pain.  Skin: Positive for wound. Negative for rash.  Neurological: Negative for dizziness, light-headedness and headaches.  Hematological: Does not bruise/bleed easily.  Psychiatric/Behavioral: Negative  for confusion.      Allergies  Review of patient's allergies indicates no known allergies.  Home Medications   Prior to Admission medications   Medication Sig Start Date End Date Taking? Authorizing Provider  acetaminophen (TYLENOL) 325 MG tablet Take 650 mg by mouth every 6 (six) hours as needed for mild pain.    Historical Provider, MD  aspirin 81 MG tablet Take 81 mg by mouth daily.    Historical Provider, MD  atorvastatin (LIPITOR) 40 MG tablet Take 40 mg by mouth daily.  11/25/14 11/25/15  Historical Provider, MD  clopidogrel (PLAVIX) 75 MG tablet Take 1 tablet (75 mg total) by mouth daily.  03/27/15   Erlene Quan, PA-C  co-enzyme Q-10 30 MG capsule Take 30 mg by mouth daily.     Historical Provider, MD  doxycycline (VIBRAMYCIN) 100 MG capsule Take 1 capsule (100 mg total) by mouth 2 (two) times daily. 09/17/15   Fredia Sorrow, MD  fludrocortisone (FLORINEF) 0.1 MG tablet TAKE 1 TABLET BY MOUTH TWICE DAILY 07/13/15   Brunetta Jeans, PA-C  furosemide (LASIX) 20 MG tablet Take 1 tablet (20 mg total) by mouth daily. 07/01/15   Lelon Perla, MD  gabapentin (NEURONTIN) 600 MG tablet TAKE 1 TABLET BY MOUTH 3 TIMES DAILY. 07/13/15   Brunetta Jeans, PA-C  glucagon 1 MG injection Inject 1 mg into the vein once as needed (Emergency Test Kit). Reported on 04/07/2015    Historical Provider, MD  glucose blood test strip 1 each by Other route as directed. Use as instructed FREESTYLE IN VITRO    Historical Provider, MD  HYDROcodone-acetaminophen (NORCO/VICODIN) 5-325 MG tablet Take 1 tablet by mouth every 6 (six) hours as needed for severe pain. 04/22/15   Brunetta Jeans, PA-C  insulin lispro (HUMALOG) 100 UNIT/ML injection Inject 1-40 Units into the skin as directed. Use in Insulin Pump, Max daily dose: 100 units/day    Historical Provider, MD  isosorbide mononitrate (IMDUR) 60 MG 24 hr tablet Take 1 tablet (60 mg total) by mouth daily. 03/21/15   Bhavinkumar Bhagat, PA  lisinopril (PRINIVIL,ZESTRIL) 10 MG tablet Take 10 mg by mouth daily. 03/13/15   Historical Provider, MD  meclizine (ANTIVERT) 12.5 MG tablet Take 1 tablet (12.5 mg total) by mouth 3 (three) times daily as needed for dizziness. 02/27/14   Percell Miller Saguier, PA-C  midodrine (PROAMATINE) 10 MG tablet TAKE 1 TABLET BY MOUTH TWICE DAILY 08/11/15   Brunetta Jeans, PA-C  nitroGLYCERIN (NITROSTAT) 0.4 MG SL tablet Place 1 tablet (0.4 mg total) under the tongue every 5 (five) minutes x 3 doses as needed for chest pain. 03/20/15   Bhavinkumar Bhagat, PA  ranitidine (ZANTAC) 150 MG tablet TAKE 1 TABLET (150 MG TOTAL) BY MOUTH 2 (TWO) TIMES DAILY.  08/11/15   Brunetta Jeans, PA-C  SENEXON-S 8.6-50 MG tablet TAKE 1 TABLET BY MOUTH DAILY. 02/13/15   Brunetta Jeans, PA-C  zolpidem (AMBIEN CR) 12.5 MG CR tablet TAKE 1 TABLET BY MOUTH AT BEDTIME 08/18/15   Brunetta Jeans, PA-C   Triage Vitals: BP 182/96 mmHg  Pulse 76  Temp(Src) 99 F (37.2 C) (Oral)  Resp 18  Ht 5' 6"  (1.676 m)  Wt 83.915 kg  BMI 29.87 kg/m2  SpO2 99%   Physical Exam  Constitutional: He is oriented to person, place, and time. He appears well-developed and well-nourished.  HENT:  Head: Normocephalic and atraumatic.  Mouth/Throat: Oropharynx is clear and moist.  Eyes: EOM are normal.  Pupils are normal Sclera clear Eyes track normally   Neck: Normal range of motion.  Cardiovascular: Normal rate, regular rhythm, normal heart sounds and intact distal pulses.   Pulmonary/Chest: Effort normal and breath sounds normal. No respiratory distress.  Abdominal: Soft. He exhibits no distension. There is no tenderness.  Musculoskeletal: Normal range of motion.  Trace edema noted to bilateral lower extremities  Capillary refill less than 1 second to L great toe Bruising noted to L ankle area Crease noted across the bottom of L foot measuring 7 cm in length and 1 cm in width Increased warmth noted to L foot  Neurological: He is alert and oriented to person, place, and time.  Skin: Skin is warm and dry.  Psychiatric: He has a normal mood and affect. Judgment normal.  Nursing note and vitals reviewed.   ED Course  Procedures (including critical care time)  DIAGNOSTIC STUDIES: Oxygen Saturation is 99% on RA, Normal by my interpretation.    COORDINATION OF CARE: 9:53 PM- Will order imaging and blood work. Will give fluids and Vancocin. Discussed treatment plan with pt at bedside and pt agreed to plan.     Labs Review Labs Reviewed  BASIC METABOLIC PANEL - Abnormal; Notable for the following:    Glucose, Bld 196 (*)    Creatinine, Ser 1.29 (*)    Calcium 8.7 (*)     GFR calc non Af Amer 58 (*)    All other components within normal limits  CBG MONITORING, ED - Abnormal; Notable for the following:    Glucose-Capillary 184 (*)    All other components within normal limits  CBC WITH DIFFERENTIAL/PLATELET   Results for orders placed or performed during the hospital encounter of 09/17/15  CBC with Differential/Platelet  Result Value Ref Range   WBC 7.0 4.0 - 10.5 K/uL   RBC 4.79 4.22 - 5.81 MIL/uL   Hemoglobin 13.8 13.0 - 17.0 g/dL   HCT 40.4 39.0 - 52.0 %   MCV 84.3 78.0 - 100.0 fL   MCH 28.8 26.0 - 34.0 pg   MCHC 34.2 30.0 - 36.0 g/dL   RDW 12.8 11.5 - 15.5 %   Platelets 276 150 - 400 K/uL   Neutrophils Relative % 65 %   Neutro Abs 4.6 1.7 - 7.7 K/uL   Lymphocytes Relative 21 %   Lymphs Abs 1.5 0.7 - 4.0 K/uL   Monocytes Relative 9 %   Monocytes Absolute 0.6 0.1 - 1.0 K/uL   Eosinophils Relative 4 %   Eosinophils Absolute 0.3 0.0 - 0.7 K/uL   Basophils Relative 1 %   Basophils Absolute 0.0 0.0 - 0.1 K/uL  Basic metabolic panel  Result Value Ref Range   Sodium 140 135 - 145 mmol/L   Potassium 3.7 3.5 - 5.1 mmol/L   Chloride 105 101 - 111 mmol/L   CO2 27 22 - 32 mmol/L   Glucose, Bld 196 (H) 65 - 99 mg/dL   BUN 16 6 - 20 mg/dL   Creatinine, Ser 1.29 (H) 0.61 - 1.24 mg/dL   Calcium 8.7 (L) 8.9 - 10.3 mg/dL   GFR calc non Af Amer 58 (L) >60 mL/min   GFR calc Af Amer >60 >60 mL/min   Anion gap 8 5 - 15  POC CBG, ED  Result Value Ref Range   Glucose-Capillary 184 (H) 65 - 99 mg/dL     Imaging Review Dg Foot Complete Left  09/17/2015  CLINICAL DATA:  Diabetic neuropathy.  Felt a pop  2 weeks ago. EXAM: LEFT FOOT - COMPLETE 3+ VIEW COMPARISON:  08/25/2015 FINDINGS: Old healed fracture of the fifth metatarsal. Old healed fracture the proximal phalanx of the great toe. No evidence of acute fracture. Mild chronic midfoot degenerative changes. Regional soft tissue arterial calcification. IMPRESSION: No acute fracture is seen. Old healed  fractures of the fifth metatarsal an the proximal phalanx of the great toe. Electronically Signed   By: Nelson Chimes M.D.   On: 09/17/2015 22:51   I have personally reviewed and evaluated these images and lab results as part of my medical decision-making.   EKG Interpretation None      MDM   Final diagnoses:  Left foot infection    Patient with known history of diabetes. Sugars well controlled. Left foot with a decrease and signs of superficial skin infection due to where the ankle was wrapped with an Ace-like bandage. No expression of pus here. A she received 1 dose of IV vancomycin here. Patient will be continued on doxycycline. Patient has podiatry for follow-up. He will call in the morning for follow-up. He will soak the foot for 20 minutes in warm water at least once a day. Return for any evidence of any new or worse infection.  I personally performed the services described in this documentation, which was scribed in my presence. The recorded information has been reviewed and is accurate.     Fredia Sorrow, MD 09/17/15 [phone]

## 2015-09-17 NOTE — Discharge Instructions (Signed)
For the left foot infection. Continue the antibiotics prescribed for the next 7 days. Call your podiatrist for follow-up in the next few days. Recommend soaking the left foot in warm water twice a day for 20 minutes. Return for any new or worse symptoms. Return for any new worse evidence of infection.

## 2015-09-17 NOTE — ED Notes (Signed)
Antibiotic Vancomycin infusing at this time, infusion not completed

## 2015-09-18 ENCOUNTER — Telehealth: Payer: Self-pay | Admitting: *Deleted

## 2015-09-18 DIAGNOSIS — E785 Hyperlipidemia, unspecified: Secondary | ICD-10-CM

## 2015-09-18 MED FILL — DOXYCYCLINE HYCLATE 100 MG: 100 | 7 days supply | Qty: 14 | Fill #0

## 2015-09-18 NOTE — Telephone Encounter (Signed)
Left message for pt to call.

## 2015-09-18 NOTE — Telephone Encounter (Signed)
-----   Message from Lewayne BuntingBrian S Crenshaw, MD sent at 09/17/2015  2:10 PM EDT ----- Change lipitor to 20 mg daily, lipds and liver 4 weeks Olga MillersBrian Crenshaw

## 2015-09-23 ENCOUNTER — Ambulatory Visit (INDEPENDENT_AMBULATORY_CARE_PROVIDER_SITE_OTHER): Payer: Medicare Other | Admitting: Podiatry

## 2015-09-23 DIAGNOSIS — L97521 Non-pressure chronic ulcer of other part of left foot limited to breakdown of skin: Secondary | ICD-10-CM

## 2015-09-23 MED FILL — LISINOPRIL 10 MG TABLET: 10 | 30 days supply | Qty: 30 | Fill #9

## 2015-09-23 NOTE — Telephone Encounter (Signed)
Change crestor to 20 mg daily; lipids and liver 4 weeks Olga MillersBrian Crenshaw

## 2015-09-23 NOTE — Telephone Encounter (Signed)
Advised patient of medication change. When I called patient back to advise he call LPPCP to schedule labs he stated he was actually taking Crestor 40 mg daily Will forward to Dr Jens Somrenshaw for review

## 2015-09-23 NOTE — Telephone Encounter (Signed)
Follow Up Call   Mr. Timothy Alvarez is calling to get his lab results , please call    Thanks

## 2015-09-24 NOTE — Telephone Encounter (Signed)
Spoke with pt, Aware of dr crenshaw's recommendations.  °

## 2015-09-25 MED ORDER — NONFORMULARY OR COMPOUNDED ITEM: Status: DC

## 2015-09-25 NOTE — Progress Notes (Signed)
Patient ID: Timothy Alvarez, male   DOB: [birth year], 60 y.o.   MRN: [id number]  Subjective: 60 year old male presents the office today for concerns of possible wound/infection to his left foot. He could've the emergency room for possible left foot infection and he was placed on doxycycline. He states that before that he had sprained his foot after his last appointment with me and he was prepped in the Ace bandage on his foot he slept in the foot swelled and developed a sore. Denies any drainage or pus or any redness to his foot currently.  Denies any systemic complaints such as fevers, chills, nausea, vomiting. No acute changes since last appointment, and no other complaints at this time.   Objective: AAO x3, NAD DP/PT pulses palpable bilaterally, CRT less than 3 seconds On the plantar aspect of the left foot more proximally are horizontal scab appearing lesions which appears to be where the Ace bandage is cutting into the skin. There is no drainage or pus. There is no surrounding erythema, ascending cellulitis. There is mild tenderness along this area..  No areas of pinpoint bony tenderness or pain with vibratory sensation. MMT 5/5, ROM WNL. No edema, erythema, increase in warmth to bilateral lower extremities.  No open lesions or pre-ulcerative lesions.  No pain with calf compression, swelling, warmth, erythema  Assessment: Left foot wound however does not appear to be clinically infected at this time.   Plan: -All treatment options discussed with the patient including all alternatives, risks, complications.  -Finish course of antibiotics. Recommended small amount of Betadine to the areas daily. Once the areas callus over will debridement. Also recommended gel cushion to help offload the area and help with pain. -Follow-up with me as scheduled or sooner if any issues are to arise. Call any questions or concerns in the meantime.  Ovid CurdMatthew Valeria Boza, DPM

## 2015-09-25 NOTE — Addendum Note (Signed)
Addended by: Alphia Kava'CONNELL, VALERY D on: 09/25/2015 10:10 AM   Modules accepted: Orders

## 2015-09-30 MED FILL — MIDODRINE HCL 10 MG TABLET: 10 | 30 days supply | Qty: 60 | Fill #2

## 2015-09-30 MED FILL — FLUDROCORTISONE 0.1 MG TAB: 0.1 | 30 days supply | Qty: 60 | Fill #2

## 2015-10-05 ENCOUNTER — Ambulatory Visit (INDEPENDENT_AMBULATORY_CARE_PROVIDER_SITE_OTHER): Payer: Medicare Other | Admitting: Physician Assistant

## 2015-10-05 ENCOUNTER — Encounter: Payer: Self-pay | Admitting: Physician Assistant

## 2015-10-05 VITALS — BP 158/84 | HR 75 | Temp 98.3°F | Resp 16 | Ht 66.0 in | Wt 184.4 lb

## 2015-10-05 DIAGNOSIS — S91302D Unspecified open wound, left foot, subsequent encounter: Secondary | ICD-10-CM

## 2015-10-05 MED ORDER — DOXYCYCLINE HYCLATE 100 MG PO CAPS: 100.0000 mg | ORAL_CAPSULE | Freq: Two times a day (BID) | ORAL | 0 refills | Status: DC

## 2015-10-05 MED FILL — DOXYCYCLINE HYC 100 MG CAP: 100 | 7 days supply | Qty: 14 | Fill #0

## 2015-10-05 NOTE — Progress Notes (Signed)
Patient with history of DM I complicated by peripheral neuropathy and CKD, coronary artery disease and PVD presents to clinic today c/o continued wound of L foot. Patient has been recently evaluated by Podiatry (09/23/15) for this issue. Was treated with antibiotics for infection. No follow-up was scheduled. Patient endorses wound has improved overall. Is soaking daily and applying betadine TID as directed by Dr. Jacqualyn Posey. Denies fever, chills. Noted increased tenderness this morning with some bleeding. Denies pustular drainage. Wants reassessed today giving his comorbid conditions and concern for infection.  Past Medical History:  Diagnosis Date  . CAD (coronary artery disease)   . Cataracts, bilateral   . Chicken pox   . Contrast dye induced nephropathy   . Diabetes type 1, uncontrolled ([place])   . Diabetic retinopathy ([place])   . Frequent headaches   . Heart attack ([place]) 09.13.2014   x5  . Hyperlipidemia   . Measles, [place] (rubella)   . Mumps   . Orthostatic hypotension   . OSA (obstructive sleep apnea) 07/17/2013  . Scarlet fever     Current Outpatient Prescriptions on File Prior to Visit  Medication Sig Dispense Refill  . acetaminophen (TYLENOL) 325 MG tablet Take 650 mg by mouth every 6 (six) hours as needed for mild pain.    Marland Kitchen aspirin 81 MG tablet Take 81 mg by mouth daily.    Marland Kitchen atorvastatin (LIPITOR) 40 MG tablet Take 40 mg by mouth as directed. 1/2 tablet daily    . clopidogrel (PLAVIX) 75 MG tablet Take 1 tablet (75 mg total) by mouth daily. 90 tablet 3  . co-enzyme Q-10 30 MG capsule Take 30 mg by mouth daily.     . fludrocortisone (FLORINEF) 0.1 MG tablet TAKE 1 TABLET BY MOUTH TWICE DAILY 60 tablet 2  . furosemide (LASIX) 20 MG tablet Take 1 tablet (20 mg total) by mouth daily. 90 tablet 3  . gabapentin (NEURONTIN) 600 MG tablet TAKE 1 TABLET BY MOUTH 3 TIMES DAILY. 90 tablet 2  . glucagon 1 MG injection Inject 1 mg into the vein once as needed (Emergency Test Kit).  Reported on 04/07/2015    . glucose blood test strip 1 each by Other route as directed. Use as instructed FREESTYLE IN VITRO    . HYDROcodone-acetaminophen (NORCO/VICODIN) 5-325 MG tablet Take 1 tablet by mouth every 6 (six) hours as needed for severe pain. 60 tablet 0  . insulin lispro (HUMALOG) 100 UNIT/ML injection Inject 1-40 Units into the skin as directed. Use in Insulin Pump, Max daily dose: 100 units/day    . isosorbide mononitrate (IMDUR) 60 MG 24 hr tablet Take 1 tablet (60 mg total) by mouth daily. 30 tablet 11  . lisinopril (PRINIVIL,ZESTRIL) 10 MG tablet Take 10 mg by mouth daily.  11  . meclizine (ANTIVERT) 12.5 MG tablet Take 1 tablet (12.5 mg total) by mouth 3 (three) times daily as needed for dizziness. 30 tablet 0  . midodrine (PROAMATINE) 10 MG tablet TAKE 1 TABLET BY MOUTH TWICE DAILY 60 tablet 2  . nitroGLYCERIN (NITROSTAT) 0.4 MG SL tablet Place 1 tablet (0.4 mg total) under the tongue every 5 (five) minutes x 3 doses as needed for chest pain. 25 tablet 12  . NONFORMULARY OR COMPOUNDED [place] compound:  Onychomycosis Nail lacquer - Fluconazole 2 %, Terbinafine 1%, DMSO, apply to affected area daily. 120 each 2  . ranitidine (ZANTAC) 150 MG tablet TAKE 1 TABLET (150 MG TOTAL) BY MOUTH 2 (TWO) TIMES DAILY. [place]  tablet 2  . SENEXON-S 8.6-50 MG tablet TAKE 1 TABLET BY MOUTH DAILY. 100 tablet 1  . zolpidem (AMBIEN CR) 12.5 MG CR tablet TAKE 1 TABLET BY MOUTH AT BEDTIME 30 tablet 2   No current facility-administered medications on file prior to visit.     No Known Allergies  Family History  Problem Relation Age of Onset  . Vascular Disease Father 39    Deceased  . Transient ischemic attack Father   . Hypertension Mother 39    Deceased  . Liver cancer Paternal Grandfather   . Breast cancer Sister   . Heart disease Sister     Ablation for rhythm disturbance  . Healthy Brother     x1  . Other Mother     s/p PPM    Social History   Social History  .  Marital status: Married    Spouse name: N/A  . Number of children: 1  . Years of education: N/A   Social History Main Topics  . Smoking status: Never Smoker  . Smokeless tobacco: None  . Alcohol use Yes     Comment: Rare  . Drug use: No  . Sexual activity: Not Asked   Other Topics Concern  . 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  They have one grown daughter.   Highest level of education:  Master's Degree    Review of Systems - See HPI.  All other ROS are negative.  Ht _0  (1.676 m)   Wt 184 lb 6 oz (83.6 kg)   BMI 29.76 kg/m   Physical Exam  Constitutional: He is oriented to person, place, and time and well-developed, well-nourished, and in no distress.  Eyes: Conjunctivae are normal.  Neck: Neck supple.  Cardiovascular: Normal rate, regular rhythm, normal heart sounds and intact distal pulses.   Pulmonary/Chest: Effort normal.  Musculoskeletal:       Left foot: There is normal range of motion.       Feet:  Neurological: He is alert and oriented to person, place, and time.  Skin: Skin is warm and dry.  Vitals reviewed.   Recent Results (from the past 2160 hour(s))  Basic Metabolic Panel (BMET)     Status: Abnormal   Collection Time: 07/09/15  2:39 PM  Result Value Ref Range   Sodium 139 135 - 146 mmol/L   Potassium 4.2 3.5 - 5.3 mmol/L   Chloride 102 98 - 110 mmol/L   CO2 26 20 - 31 mmol/L   Glucose, Bld 243 (H) 65 - 99 mg/dL   BUN 22 7 - 25 mg/dL   Creat 1.28 (H) 0.70 - 1.25 mg/dL   Calcium 8.7 8.6 - 10.3 mg/dL  Lipid panel     Status: None   Collection Time: 09/17/15  8:19 AM  Result Value Ref Range   Cholesterol 104 0 - 200 mg/dL    Comment: ATP III Classification       Desirable:  < 200 mg/dL               Borderline High:  200 - 239 mg/dL          High:  > = 240 mg/dL   Triglycerides 103.0 0.0 - 149.0 mg/dL    Comment: Normal:  <150 mg/dLBorderline High:  150 - 199 mg/dL   HDL 40.70  >39.00 mg/dL   VLDL 20.6 0.0 - 40.0 mg/dL  LDL Cholesterol 42 0 - 99 mg/dL   Total CHOL/HDL Ratio 3     Comment:                Men          Women1/2 Average Risk     3.4          3.3Average Risk          5.0          4.42X Average Risk          9.6          7.13X Average Risk          15.0          11.0                       NonHDL 62.88     Comment: NOTE:  Non-HDL goal should be 30 mg/dL higher than patient's LDL goal (i.e. LDL goal of < 70 mg/dL, would have non-HDL goal of < 100 mg/dL)  Hepatic function panel     Status: Abnormal   Collection Time: 09/17/15  8:19 AM  Result Value Ref Range   Total Bilirubin 0.5 0.2 - 1.2 mg/dL   Bilirubin, Direct 0.1 0.0 - 0.3 mg/dL   Alkaline Phosphatase 162 (H) 39 - 117 U/L   AST 40 (H) 0 - 37 U/L   ALT 60 (H) 0 - 53 U/L   Total Protein 6.6 6.0 - 8.3 g/dL   Albumin 3.6 3.5 - 5.2 g/dL  CBC with Differential/Platelet     Status: None   Collection Time: 09/17/15  8:20 PM  Result Value Ref Range   WBC 7.0 4.0 - 10.5 K/uL   RBC 4.79 4.22 - 5.81 MIL/uL   Hemoglobin 13.8 13.0 - 17.0 g/dL   HCT 40.4 39.0 - 52.0 %   MCV 84.3 78.0 - 100.0 fL   MCH 28.8 26.0 - 34.0 pg   MCHC 34.2 30.0 - 36.0 g/dL   RDW 12.8 11.5 - 15.5 %   Platelets 276 150 - 400 K/uL   Neutrophils Relative % 65 %   Neutro Abs 4.6 1.7 - 7.7 K/uL   Lymphocytes Relative 21 %   Lymphs Abs 1.5 0.7 - 4.0 K/uL   Monocytes Relative 9 %   Monocytes Absolute 0.6 0.1 - 1.0 K/uL   Eosinophils Relative 4 %   Eosinophils Absolute 0.3 0.0 - 0.7 K/uL   Basophils Relative 1 %   Basophils Absolute 0.0 0.0 - 0.1 K/uL  Basic metabolic panel     Status: Abnormal   Collection Time: 09/17/15  8:20 PM  Result Value Ref Range   Sodium 140 135 - 145 mmol/L   Potassium 3.7 3.5 - 5.1 mmol/L   Chloride 105 101 - 111 mmol/L   CO2 27 22 - 32 mmol/L   Glucose, Bld 196 (H) 65 - 99 mg/dL   BUN 16 6 - 20 mg/dL   Creatinine, Ser 1.29 (H) 0.61 - 1.24 mg/dL   Calcium 8.7 (L) 8.9 - 10.3 mg/dL   GFR calc non  Af Amer 58 (L) >60 mL/min   GFR calc Af Amer >60 >60 mL/min    Comment: (NOTE) The eGFR has been calculated using the CKD EPI equation. This calculation has not been validated in all clinical situations. eGFR's persistently <60 mL/min signify possible Chronic Kidney Disease.    Anion gap 8 5 - 15  POC CBG, ED  Status: Abnormal   Collection Time: 09/17/15 10:11 PM  Result Value Ref Range   Glucose-Capillary 184 (H) 65 - 99 mg/dL    Assessment/Plan: 1. Open wound of left foot, subsequent encounter No pustular drainage noted. ROM intact without pain. Mild tenderness with palpation over wound. Wound cleaned with sterile saline. No new lacerations or punctures noted. Betadine applied. Wound dressed. Will start Doxycycline BID. Patient without alarm symptoms but giving his comorbid conditions, small threshold for ER assessment. Reviewed precautions with patient. Has appt with Podiatry Wednesday. FU as scheduled.  - doxycycline (VIBRAMYCIN) 100 MG capsule; Take 1 capsule (100 mg total) by mouth 2 (two) times daily.  Dispense: 14 capsule; Refill: 0   Leeanne Rio, [place]

## 2015-10-05 NOTE — Patient Instructions (Signed)
Please take antibiotic as directed. Keep skin clean and dry. Apply betadine as instructed by your podiatrist.  Follow-up with Podiatry Wednesday as scheduled.  If you notice any worsening pain, any fever, chill or pus draining from wound, go to the ER immediately.

## 2015-10-05 NOTE — Progress Notes (Signed)
Pre visit review using our clinic review tool, if applicable. No additional management support is needed unless otherwise documented below in the visit note/SLS  

## 2015-10-09 ENCOUNTER — Ambulatory Visit (INDEPENDENT_AMBULATORY_CARE_PROVIDER_SITE_OTHER): Payer: Medicare Other | Admitting: Physician Assistant

## 2015-10-09 ENCOUNTER — Encounter: Payer: Self-pay | Admitting: Physician Assistant

## 2015-10-09 VITALS — BP 100/58 | HR 74 | Temp 97.9°F | Resp 16 | Ht 66.0 in | Wt 188.5 lb

## 2015-10-09 DIAGNOSIS — S91302D Unspecified open wound, left foot, subsequent encounter: Secondary | ICD-10-CM

## 2015-10-09 MED FILL — ISOSORBIDE MN ER 60 MG TAB: 60 | 30 days supply | Qty: 30 | Fill #7

## 2015-10-09 NOTE — Patient Instructions (Signed)
Please skin and dry.  Continue wound care as directed. Your wound is healing! Some soreness is suspected.  Make sure to follow-up with Dr. Ardelle Anton next week as scheduled.  Restart probiotic. Take Zantac in the evening. FU if symptoms are not improving.

## 2015-10-09 NOTE — Progress Notes (Signed)
Pre visit review using our clinic review tool, if applicable. No additional management support is needed unless otherwise documented below in the visit note/SLS  

## 2015-10-09 NOTE — Progress Notes (Signed)
Patient presents to clinic today for follow-up of diabetic foot wound of L foot. Patient endorses since last visit, foot has remained tender after ambulating throughout the day. Has taken antibiotic as directed. Denies and residual surrounding redness. Denies fever, chills or drainage from wound. Has been performing wound care as directed. Has FU scheduled with Podiatry next week. Denies new symptoms.  Past Medical History:  Diagnosis Date  . CAD (coronary artery disease)   . Cataracts, bilateral   . Chicken pox   . Contrast dye induced nephropathy   . Diabetes type 1, uncontrolled ([place])   . Diabetic retinopathy ([place])   . Frequent headaches   . Heart attack ([place]) 09.13.2014   x5  . Hyperlipidemia   . Measles, [place] (rubella)   . Mumps   . Orthostatic hypotension   . OSA (obstructive sleep apnea) 07/17/2013  . Scarlet fever     Current Outpatient Prescriptions on File Prior to Visit  Medication Sig Dispense Refill  . acetaminophen (TYLENOL) 325 MG tablet Take 650 mg by mouth every 6 (six) hours as needed for mild pain.    Marland Kitchen aspirin 81 MG tablet Take 81 mg by mouth daily.    Marland Kitchen atorvastatin (LIPITOR) 40 MG tablet Take 20 mg by mouth daily. 1/2 tablet daily     . clopidogrel (PLAVIX) 75 MG tablet Take 1 tablet (75 mg total) by mouth daily. 90 tablet 3  . co-enzyme Q-10 30 MG capsule Take 30 mg by mouth daily.     Marland Kitchen doxycycline (VIBRAMYCIN) 100 MG capsule Take 1 capsule (100 mg total) by mouth 2 (two) times daily. 14 capsule 0  . fludrocortisone (FLORINEF) 0.1 MG tablet TAKE 1 TABLET BY MOUTH TWICE DAILY 60 tablet 2  . furosemide (LASIX) 20 MG tablet Take 1 tablet (20 mg total) by mouth daily. 90 tablet 3  . gabapentin (NEURONTIN) 600 MG tablet TAKE 1 TABLET BY MOUTH 3 TIMES DAILY. (Patient taking differently: TAKE 1 TABLET BY MOUTH 3 TIMES DAILY AS NEEDED) 90 tablet 2  . glucagon 1 MG injection Inject 1 mg into the vein once as needed (Emergency Test Kit). Reported on 04/07/2015    .  glucose blood test strip 1 each by Other route as directed. Use as instructed FREESTYLE IN VITRO    . HYDROcodone-acetaminophen (NORCO/VICODIN) 5-325 MG tablet Take 1 tablet by mouth every 6 (six) hours as needed for severe pain. 60 tablet 0  . insulin lispro (HUMALOG) 100 UNIT/ML injection Inject 1-40 Units into the skin as directed. Use in Insulin Pump, Max daily dose: 100 units/day    . isosorbide mononitrate (IMDUR) 60 MG 24 hr tablet Take 1 tablet (60 mg total) by mouth daily. 30 tablet 11  . lisinopril (PRINIVIL,ZESTRIL) 10 MG tablet Take 10 mg by mouth daily.  11  . meclizine (ANTIVERT) 12.5 MG tablet Take 1 tablet (12.5 mg total) by mouth 3 (three) times daily as needed for dizziness. 30 tablet 0  . midodrine (PROAMATINE) 10 MG tablet TAKE 1 TABLET BY MOUTH TWICE DAILY 60 tablet 2  . nitroGLYCERIN (NITROSTAT) 0.4 MG SL tablet Place 1 tablet (0.4 mg total) under the tongue every 5 (five) minutes x 3 doses as needed for chest pain. 25 tablet 12  . NONFORMULARY OR COMPOUNDED [place] compound:  Onychomycosis Nail lacquer - Fluconazole 2 %, Terbinafine 1%, DMSO, apply to affected area daily. 120 each 2  . ranitidine (ZANTAC) 150 MG tablet TAKE 1 TABLET (150 MG TOTAL) BY MOUTH  2 (TWO) TIMES DAILY. 60 tablet 2  . SENEXON-S 8.6-50 MG tablet TAKE 1 TABLET BY MOUTH DAILY. 100 tablet 1  . zolpidem (AMBIEN CR) 12.5 MG CR tablet TAKE 1 TABLET BY MOUTH AT BEDTIME 30 tablet 2   No current facility-administered medications on file prior to visit.     No Known Allergies  Family History  Problem Relation Age of Onset  . Vascular Disease Father 3    Deceased  . Transient ischemic attack Father   . Hypertension Mother 68    Deceased  . Liver cancer Paternal Grandfather   . Breast cancer Sister   . Heart disease Sister     Ablation for rhythm disturbance  . Healthy Brother     x1  . Other Mother     s/p PPM    Social History   Social History  . Marital status: Married     Spouse name: N/A  . Number of children: 1  . Years of education: N/A   Social History Main Topics  . Smoking status: Never Smoker  . Smokeless tobacco: None  . Alcohol use Yes     Comment: Rare  . Drug use: No  . Sexual activity: Not Asked   Other Topics Concern  . 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  They have one grown daughter.   Highest level of education:  Master's Degree    Review of Systems - See HPI.  All other ROS are negative.  BP (!) 100/58 (BP Location: Right Arm, Patient Position: Sitting, Cuff Size: Normal)   Pulse 74   Temp 97.9 F (36.6 C) (Oral)   Resp 16   Ht 5' 6"  (1.676 m)   Wt 188 lb 8 oz (85.5 kg)   SpO2 98%   BMI 30.42 kg/m   Physical Exam  Constitutional: He is oriented to person, place, and time and well-developed, well-nourished, and in no distress.  HENT:  Head: Normocephalic and atraumatic.  Eyes: Conjunctivae are normal.  Cardiovascular: Normal rate, regular rhythm and intact distal pulses.   Pulmonary/Chest: Effort normal.  Neurological: He is alert and oriented to person, place, and time.  Skin:     Psychiatric: Affect normal.  Vitals reviewed.   Recent Results (from the past 2160 hour(s))  Lipid panel     Status: None   Collection Time: 09/17/15  8:19 AM  Result Value Ref Range   Cholesterol 104 0 - 200 mg/dL    Comment: ATP III Classification       Desirable:  < 200 mg/dL               Borderline High:  200 - 239 mg/dL          High:  > = 240 mg/dL   Triglycerides 103.0 0.0 - 149.0 mg/dL    Comment: Normal:  <150 mg/dLBorderline High:  150 - 199 mg/dL   HDL 40.70 >39.00 mg/dL   VLDL 20.6 0.0 - 40.0 mg/dL   LDL Cholesterol 42 0 - 99 mg/dL   Total CHOL/HDL Ratio 3     Comment:                Men          Women1/2 Average Risk     3.4          3.3Average Risk          5.0  4.42X Average Risk          9.6          7.13X Average Risk          15.0           11.0                       NonHDL 62.88     Comment: NOTE:  Non-HDL goal should be 30 mg/dL higher than patient's LDL goal (i.e. LDL goal of < 70 mg/dL, would have non-HDL goal of < 100 mg/dL)  Hepatic function panel     Status: Abnormal   Collection Time: 09/17/15  8:19 AM  Result Value Ref Range   Total Bilirubin 0.5 0.2 - 1.2 mg/dL   Bilirubin, Direct 0.1 0.0 - 0.3 mg/dL   Alkaline Phosphatase 162 (H) 39 - 117 U/L   AST 40 (H) 0 - 37 U/L   ALT 60 (H) 0 - 53 U/L   Total Protein 6.6 6.0 - 8.3 g/dL   Albumin 3.6 3.5 - 5.2 g/dL  CBC with Differential/Platelet     Status: None   Collection Time: 09/17/15  8:20 PM  Result Value Ref Range   WBC 7.0 4.0 - 10.5 K/uL   RBC 4.79 4.22 - 5.81 MIL/uL   Hemoglobin 13.8 13.0 - 17.0 g/dL   HCT 40.4 39.0 - 52.0 %   MCV 84.3 78.0 - 100.0 fL   MCH 28.8 26.0 - 34.0 pg   MCHC 34.2 30.0 - 36.0 g/dL   RDW 12.8 11.5 - 15.5 %   Platelets 276 150 - 400 K/uL   Neutrophils Relative % 65 %   Neutro Abs 4.6 1.7 - 7.7 K/uL   Lymphocytes Relative 21 %   Lymphs Abs 1.5 0.7 - 4.0 K/uL   Monocytes Relative 9 %   Monocytes Absolute 0.6 0.1 - 1.0 K/uL   Eosinophils Relative 4 %   Eosinophils Absolute 0.3 0.0 - 0.7 K/uL   Basophils Relative 1 %   Basophils Absolute 0.0 0.0 - 0.1 K/uL  Basic metabolic panel     Status: Abnormal   Collection Time: 09/17/15  8:20 PM  Result Value Ref Range   Sodium 140 135 - 145 mmol/L   Potassium 3.7 3.5 - 5.1 mmol/L   Chloride 105 101 - 111 mmol/L   CO2 27 22 - 32 mmol/L   Glucose, Bld 196 (H) 65 - 99 mg/dL   BUN 16 6 - 20 mg/dL   Creatinine, Ser 1.29 (H) 0.61 - 1.24 mg/dL   Calcium 8.7 (L) 8.9 - 10.3 mg/dL   GFR calc non Af Amer 58 (L) >60 mL/min   GFR calc Af Amer >60 >60 mL/min    Comment: (NOTE) The eGFR has been calculated using the CKD EPI equation. This calculation has not been validated in all clinical situations. eGFR's persistently <60 mL/min signify possible Chronic Kidney Disease.    Anion gap 8 5 - 15    POC CBG, ED     Status: Abnormal   Collection Time: 09/17/15 10:11 PM  Result Value Ref Range   Glucose-Capillary 184 (H) 65 - 99 mg/dL    Assessment/Plan: 1. Open wound of left foot, subsequent encounter No sign of infection. Is healing nicely compared to last check. Tenderness to be expected. Pain meds reviewed. Follow-up with Podiatry next week as scheduled.   Leeanne Rio, PA-C

## 2015-10-14 ENCOUNTER — Encounter: Payer: Self-pay | Admitting: Podiatry

## 2015-10-14 ENCOUNTER — Ambulatory Visit (INDEPENDENT_AMBULATORY_CARE_PROVIDER_SITE_OTHER): Payer: Medicare Other | Admitting: Podiatry

## 2015-10-14 DIAGNOSIS — L89891 Pressure ulcer of other site, stage 1: Secondary | ICD-10-CM | POA: Diagnosis not present

## 2015-10-14 DIAGNOSIS — L97521 Non-pressure chronic ulcer of other part of left foot limited to breakdown of skin: Secondary | ICD-10-CM

## 2015-10-14 MED FILL — GABAPENTIN 600 MG TABLET: 600 | 30 days supply | Qty: 90 | Fill #2

## 2015-10-14 MED FILL — raNITIdine HCL 150 MG TABS: 150 | 30 days supply | Qty: 60 | Fill #2

## 2015-10-20 MED FILL — ZOLPIDEM TART ER 12.5 MG TA: 12.5 | 30 days supply | Qty: 30 | Fill #2

## 2015-10-21 ENCOUNTER — Other Ambulatory Visit (INDEPENDENT_AMBULATORY_CARE_PROVIDER_SITE_OTHER): Payer: Medicare Other

## 2015-10-21 DIAGNOSIS — E785 Hyperlipidemia, unspecified: Secondary | ICD-10-CM

## 2015-10-21 MED FILL — FREESTYLE TEST STRIPS: 59 days supply | Qty: 200 | Fill #0

## 2015-10-22 LAB — HEPATIC FUNCTION PANEL
ALBUMIN: 3.7 g/dL (ref 3.6–5.1)
ALT: 51 U/L — ABNORMAL HIGH (ref 9–46)
AST: 39 U/L — AB (ref 10–35)
Alkaline Phosphatase: 138 U/L — ABNORMAL HIGH (ref 40–115)
BILIRUBIN TOTAL: 0.4 mg/dL (ref 0.2–1.2)
Bilirubin, Direct: 0.1 mg/dL (ref <=0.2)
Indirect Bilirubin: 0.3 mg/dL (ref 0.2–1.2)
Total Protein: 6.3 g/dL (ref 6.1–8.1)

## 2015-10-22 LAB — LIPID PANEL
CHOL/HDL RATIO: 1.9 ratio (ref <=5.0)
Cholesterol: 123 mg/dL — ABNORMAL LOW (ref 125–200)
HDL: 64 mg/dL (ref >=40)
LDL CALC: 49 mg/dL (ref <130)
Triglycerides: 51 mg/dL (ref <150)
VLDL: 10 mg/dL (ref <30)

## 2015-10-26 ENCOUNTER — Other Ambulatory Visit: Payer: Self-pay | Admitting: Physician Assistant

## 2015-10-26 MED FILL — MIDODRINE HCL 10 MG TABLET: 10 | 30 days supply | Qty: 60 | Fill #0

## 2015-10-26 MED FILL — LISINOPRIL 10 MG TABLET: 10 | 30 days supply | Qty: 30 | Fill #10

## 2015-10-26 MED FILL — FLUDROCORTISONE 0.1 MG TAB: 0.1 | 30 days supply | Qty: 60 | Fill #0

## 2015-10-26 NOTE — Telephone Encounter (Signed)
Rx request to pharmacy/SLS  

## 2015-10-26 NOTE — Progress Notes (Signed)
Patient ID: Timothy Alvarez, male   DOB: 11/21/1955, 60 y.o.   MRN: [id number]  Subjective: 60 year old male presents the office today for follow-up evaluation of a wound to his left heel. He states that along the areas of the scab they are painful. He's been using antibiotic ointment along the areas daily. He states he has noticed opening or any drainage or swelling to his foot. Denies any systemic complaints such as fevers, chills, nausea, vomiting. No acute changes since last appointment, and no other complaints at this time.   Objective: AAO x3, NAD DP/PT pulses palpable bilaterally, CRT less than 3 seconds On the plantar aspect of the left foot more proximally are horizontal scab appearing lesions which appears to be where the Ace bandage is cutting into the skin. Today there is actually granular wound measuring approximately 4 x 0.3 cm there is granular. This appears to be over the area or whether scab come off. There is no drainage, pus. There is no edema, erythema, increase in warmth. Mild tenderness directly along the scabs. No open lesions or pre-ulcerative lesions.  No pain with calf compression, swelling, warmth, erythema  Assessment: Left foot wound however does not appear to be clinically infected at this time.   Plan: -All treatment options discussed with the patient including all alternatives, risks, complications.  -Discussed that were loose were lightly debrided today. Recommend continue with Neosporin, antibiotic ointment along the wound daily as well as a dressing. There is no current signs of infection of her continue monitor closely. Follow up as scheduled or sooner if needed. If he signs or symptoms of infection are to occur to call the office or go to the ER.  Ovid CurdMatthew Wagoner, DPM

## 2015-10-28 ENCOUNTER — Telehealth: Payer: Self-pay | Admitting: *Deleted

## 2015-10-28 DIAGNOSIS — R7989 Other specified abnormal findings of blood chemistry: Secondary | ICD-10-CM

## 2015-10-28 DIAGNOSIS — R945 Abnormal results of liver function studies: Principal | ICD-10-CM

## 2015-10-28 NOTE — Telephone Encounter (Signed)
-----   Message from Lewayne BuntingBrian S Crenshaw, MD sent at 10/22/2015  7:50 AM EDT ----- Repeat LFTs 12 weeks Olga MillersBrian Crenshaw

## 2015-10-28 NOTE — Telephone Encounter (Signed)
Spoke with pt, aware of results. He lives in [place] and would like to get his labs drawn at [facility]. Orders placed and pt made aware he will need to make an appt with them for lab work.

## 2015-11-03 ENCOUNTER — Other Ambulatory Visit: Payer: Self-pay | Admitting: Physician Assistant

## 2015-11-03 MED FILL — ISOSORBIDE MN ER 60 MG TAB: 60 | 30 days supply | Qty: 30 | Fill #8

## 2015-11-04 ENCOUNTER — Ambulatory Visit (INDEPENDENT_AMBULATORY_CARE_PROVIDER_SITE_OTHER): Payer: Medicare Other | Admitting: Podiatry

## 2015-11-04 ENCOUNTER — Encounter: Payer: Self-pay | Admitting: Podiatry

## 2015-11-04 ENCOUNTER — Telehealth: Payer: Self-pay

## 2015-11-04 DIAGNOSIS — L97521 Non-pressure chronic ulcer of other part of left foot limited to breakdown of skin: Secondary | ICD-10-CM | POA: Diagnosis not present

## 2015-11-04 DIAGNOSIS — E1121 Type 2 diabetes mellitus with diabetic nephropathy: Secondary | ICD-10-CM

## 2015-11-04 NOTE — Telephone Encounter (Signed)
I spoke with the pt and he states that he has a lab appointment scheduled for next month. Pt states that he will complete this lab at his visit.

## 2015-11-06 DIAGNOSIS — L97529 Non-pressure chronic ulcer of other part of left foot with unspecified severity: Secondary | ICD-10-CM | POA: Insufficient documentation

## 2015-11-06 MED FILL — HumaLOG 100 UNIT/ML SOLN: 100 | 30 days supply | Qty: 20 | Fill #0

## 2015-11-06 NOTE — Progress Notes (Signed)
Patient ID: Timothy Alvarez, male   DOB: 1955/12/30, 60 y.o.   MRN: [id number]  Subjective: 60 year old male presents the office today for follow-up evaluation of a wound to his left heel. He states that overall he is doing better but the area does feel bruised at times. He has been seen in about ointment on the wound daily. He denies any drainage or pus or any redness or swelling. Denies any systemic complaints such as fevers, chills, nausea, vomiting. No acute changes since last appointment, and no other complaints at this time.   Objective: AAO x3, NAD DP/PT pulses palpable bilaterally, CRT less than 3 seconds On the area of the ulceration this area appears to be healing. There is a horizontal-type wound which remains today measuring approximately 2 x 0.2 cm. There wound is superficial with a granular wound base. There is no probing, undermining or tunneling. There is no swelling erythema, ascending cellulitis, fluctuance, crepitus, malodor. Hyperkeratotic lesions along the area of the previous scabs from where the bandage was digging into the skin. No other open lesions or pre-ulcerative lesions are identified today. No pain with calf compression, swelling, warmth, erythema  Assessment: Left foot wound improving without signs of infection.  Plan: -All treatment options discussed with the patient including all alternatives, risks, complications.  -Swelling of hyperkeratotic tissue is debrided today. The wound was also cleaned today. Continue in about ointment dressing changes daily. Elevation. Discussed with him to be careful on wrapping the foot to help prevent any other abrasions or wounds to his feet. Monitor for signs or symptoms of infection. Follow-up as scheduled or sooner if any issues are to arise. Call any questions concerns meantime.  Ovid CurdMatthew Wagoner, DPM

## 2015-11-12 ENCOUNTER — Other Ambulatory Visit: Payer: Self-pay | Admitting: Physician Assistant

## 2015-11-13 MED FILL — GABAPENTIN 600 MG TABLET: 600 | 30 days supply | Qty: 90 | Fill #0

## 2015-11-17 MED FILL — raNITIdine HCL 150 MG TABS: 150 | 30 days supply | Qty: 60 | Fill #0

## 2015-11-19 ENCOUNTER — Other Ambulatory Visit: Payer: Self-pay | Admitting: Physician Assistant

## 2015-11-19 NOTE — Telephone Encounter (Signed)
Reviewed NCCSR- he is [place] RF for him today

## 2015-11-19 NOTE — Telephone Encounter (Signed)
Requesting:  zolpidem Contract   None UDS  None Last OV    10/05/2015 Last Refill   #30 with 2 refills on 08/18/2015  Please Advise

## 2015-11-23 ENCOUNTER — Telehealth: Payer: Self-pay | Admitting: Physician Assistant

## 2015-11-23 MED FILL — LISINOPRIL 10 MG TABLET: 10 | 30 days supply | Qty: 30 | Fill #11

## 2015-11-23 MED FILL — ZOLPIDEM TART ER 12.5 MG TA: 12.5 | 30 days supply | Qty: 30 | Fill #0

## 2015-11-23 NOTE — Telephone Encounter (Signed)
Pt called in, he says that medication is requiring a PA for medication. He says that he has been with out his medication. Pt would like to have this completed as soon as possible.

## 2015-11-23 NOTE — Telephone Encounter (Signed)
Have you seen this PA for Zolpidem.?/SLS 09/11 Thanks.

## 2015-11-25 NOTE — Telephone Encounter (Signed)
Per Jasmine DecemberSharon she has started the PA for the Zolpidem.//AB/CMA

## 2015-11-25 NOTE — Telephone Encounter (Addendum)
Per Cover My Meds , initial PA was on wrong form; initiated new PA. Later received fax from [facility] stating that Rx had been Approved from 03/14/2015 - 03/13/2016 [referral #[id number]]#[id number]]. Called pharmacy and spoke with Romeo AppleBen to see if he had received approval and he stated that patient has already p/u and paid out of pocket. Upon further review, he stated that with the approval, it seems that Rx is going to be cheaper for patient to pay OOP; he is going to doing further review and will inform patient if need be/SLS 09/13

## 2015-12-01 ENCOUNTER — Ambulatory Visit (HOSPITAL_BASED_OUTPATIENT_CLINIC_OR_DEPARTMENT_OTHER)
Admission: RE | Admit: 2015-12-01 | Discharge: 2015-12-01 | Disposition: A | Discharge status: Home / Self Care | Payer: Medicare Other | Source: Ambulatory Visit | Attending: Podiatry | Admitting: Podiatry

## 2015-12-01 ENCOUNTER — Telehealth: Payer: Self-pay | Admitting: Cardiology

## 2015-12-01 ENCOUNTER — Ambulatory Visit (INDEPENDENT_AMBULATORY_CARE_PROVIDER_SITE_OTHER): Payer: Medicare Other | Admitting: Podiatry

## 2015-12-01 ENCOUNTER — Encounter: Payer: Self-pay | Admitting: Podiatry

## 2015-12-01 DIAGNOSIS — S92354A Nondisplaced fracture of fifth metatarsal bone, right foot, initial encounter for closed fracture: Secondary | ICD-10-CM | POA: Diagnosis not present

## 2015-12-01 DIAGNOSIS — M79671 Pain in right foot: Secondary | ICD-10-CM | POA: Diagnosis present

## 2015-12-01 DIAGNOSIS — R52 Pain, unspecified: Secondary | ICD-10-CM

## 2015-12-01 DIAGNOSIS — S92301A Fracture of unspecified metatarsal bone(s), right foot, initial encounter for closed fracture: Secondary | ICD-10-CM

## 2015-12-01 DIAGNOSIS — R609 Edema, unspecified: Secondary | ICD-10-CM

## 2015-12-01 DIAGNOSIS — M779 Enthesopathy, unspecified: Secondary | ICD-10-CM | POA: Diagnosis not present

## 2015-12-01 DIAGNOSIS — X58XXXA Exposure to other specified factors, initial encounter: Secondary | ICD-10-CM | POA: Insufficient documentation

## 2015-12-01 DIAGNOSIS — L97521 Non-pressure chronic ulcer of other part of left foot limited to breakdown of skin: Secondary | ICD-10-CM | POA: Diagnosis not present

## 2015-12-01 DIAGNOSIS — M85871 Other specified disorders of bone density and structure, right ankle and foot: Secondary | ICD-10-CM | POA: Insufficient documentation

## 2015-12-01 MED FILL — HumaLOG 100 UNIT/ML SOLN: 100 | 30 days supply | Qty: 20 | Fill #1

## 2015-12-01 NOTE — Telephone Encounter (Signed)
No further recommendations.

## 2015-12-01 NOTE — Progress Notes (Addendum)
Patient ID: Timothy Alvarez, male   DOB: [birth year], 60 y.o.   MRN: [id number]  Subjective: 60 year old male presents the office today for follow-up evaluation of a wound to his left heel.  He states that it is doing much better and he has not noticed any open sores or any drainage. He has had swelling to both of his feet and legs and he has called his cardiologist this morning. He does take a water pill but he is unsure of how much. He has had some pain in the right foot he believes this is due to the swelling. No recent injury or trauma. No redness or warmth. Denies any systemic complaints such as fevers, chills, nausea, vomiting. No acute changes since last appointment, and no other complaints at this time.   Objective: AAO x3, NAD DP/PT pulses palpable bilaterally, CRT less than 3 seconds On the area of the ulceration this area appears to be healing. A scab is overlying the wound. Upon debridement there is no overlying ulceration identified today and the wound appears to be healed. There is no swelling erythema or ascending synovitis. No fluctuance or crepitus. No malodor. No other open lesions or pre-ulcer lesions identified bilaterally. This bilateral lower extremity edema present. Mild tenderness the lateral aspect of the right foot on the fifth metatarsal base on the course the peroneal tendon. Erythema or increase in warmth. No skin breakdown. No pain with calf compression, warmth, erythema  Assessment: Left foot wound which is healed, right foot pain, bilateral swelling  Plan: -All treatment options discussed with the patient including all alternatives, risks, complications.  -Hyperkeratotic lesion was debrided and the wound appears to be healed today. Monitor for any reoccurrence. -Continue to follow-up with primary care/cardiology for swelling -X-rays ordered right foot -Follow up in 9 weeks or sooner if needed. Call any questions or concerns in the meantime.  Ovid CurdMatthew Alasia Enge,  DPM   Addendum: X-rays were reviewed which showed fracture of the 5th metatarsal base. Called the patient and he cam back in and was fitted for a CAM boot. Reviewed x-ray with the patient. Follow-up in 3 weeks.   D  CLINICAL DATA:  Right foot pain and swelling for 1 month. The patient denies trauma. Initial encounter.  EXAM: RIGHT FOOT COMPLETE - 3+ VIEW  COMPARISON:  None.  FINDINGS: Mildly comminuted but nondisplaced fracture of the base of the fifth metatarsal is identified. No other fracture is seen. Bones are osteopenic. 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 Mild degenerative change about the first MTP joint is seen. Atherosclerotic calcifications are noted.  IMPRESSION: Mildly comminuted but nondisplaced fracture of the base of fifth metatarsal appears acute.  Osteopenia.  Mild first MTP degenerative change.  Medullary infarct distal tibia.   Electronically Signed   By: Drusilla Kannerhomas  Dalessio M.D.   On: 12/01/2015 14:25

## 2015-12-01 NOTE — Telephone Encounter (Signed)
New Message  Pt c/o swelling: STAT is pt has developed SOB within 24 hours  1. How long have you been experiencing swelling? Weekend  2. Where is the swelling located? Ankles and Feet  3.  Are you currently taking a "fluid pill"? Yes, pt doesn't know name nor Mg. Pt voiced mother in law takes care of pts medications.  4.  Are you currently SOB? Not currently, but was over the weekend  5.  Have you traveled recently? No  Pt voiced to reach him after 1230 pm.  Please f/u with pt.

## 2015-12-01 NOTE — Telephone Encounter (Signed)
Returned phone call. Pt expressed concern that his ankles and feet have had some more noted swelling since Sunday. He's experienced some mild orthopnea when resting or reclining, notes no exertional fatigue or SOB on ambulation, however. Patient denies chest pain, abdominal swelling. Pt states usually his ankle swelling is a non-issue. He is compliant w/ current med regimen.  Advised additional 20mg  lasix today for total of 40mg , repeat dose increase tomorrow if needed for swelling.   Pt to [facility] Ward [facility]iv[facility]hris [facility]. I have given pt appt information and he is aware to get further instruction at appt, and call sooner if new concerns.  Routed to Dr. Allyson SabalBerry ([facility]) for any further recommendations.

## 2015-12-01 NOTE — Telephone Encounter (Signed)
Acknowledged, pt aware of appt and instructions and will see Thayer OhmChris for eval Thursday.

## 2015-12-03 ENCOUNTER — Ambulatory Visit: Payer: Medicare Other | Admitting: Nurse Practitioner

## 2015-12-07 MED FILL — FUROSEMIDE 20 MG TABLET: 20 | 90 days supply | Qty: 90 | Fill #2

## 2015-12-07 MED FILL — MIDODRINE HCL 10 MG TABLET: 10 | 30 days supply | Qty: 60 | Fill #1

## 2015-12-07 MED FILL — ISOSORBIDE MN ER 60 MG TAB: 60 | 30 days supply | Qty: 30 | Fill #9

## 2015-12-07 MED FILL — FLUDROCORTISONE 0.1 MG TAB: 0.1 | 30 days supply | Qty: 60 | Fill #1

## 2015-12-09 ENCOUNTER — Other Ambulatory Visit: Payer: Self-pay | Admitting: Physician Assistant

## 2015-12-09 NOTE — Telephone Encounter (Signed)
Rx request to pharmacy/SLS  

## 2015-12-15 MED FILL — FREESTYLE TEST STRIPS: 59 days supply | Qty: 200 | Fill #1

## 2015-12-16 MED FILL — POTASSIUM CITRATE ER 15 MEQ: 15 MEQ | 30 days supply | Qty: 60 | Fill #2

## 2015-12-16 MED FILL — raNITIdine HCL 150 MG TABS: 150 | 30 days supply | Qty: 60 | Fill #0

## 2015-12-16 MED FILL — CLOPIDOGREL 75 MG TABLET: 75 | 90 days supply | Qty: 90 | Fill #3

## 2015-12-17 ENCOUNTER — Telehealth: Payer: Self-pay | Admitting: Physician Assistant

## 2015-12-17 NOTE — Telephone Encounter (Signed)
That is fine with me.  Thank you! 

## 2015-12-17 NOTE — Telephone Encounter (Signed)
Patient is [facility] because of distance. Plse adv

## 2015-12-18 NOTE — Telephone Encounter (Signed)
Ok with me 

## 2015-12-18 NOTE — Progress Notes (Signed)
HPI: FU CAD. Patient previously lived in [place]. He had his first myocardial infarction in 2010. He has had subsequent events. He moved here in May of 2014. Cardiac catheterization in September 2014 and had stent to the mid right coronary artery. There was note of a mid-90% LAD; small caliber and not suitable for PCI or grafting. Ejection fraction 60%. Procedure complicated by contrast nephropathy requiring dialysis. Carotid Dopplers February 2015 showed mild focal plaques in both carotid bifurcations but no hemodynamically significant stenosis. Also with h/o orthostatic hypotension. Last cardiac catheterization in January 2017 showed severe diffuse disease in the LAD and circumflex and diffuse distal disease in the RCA. The stent in the right coronary was patent. Echocardiogram January 2017 showed normal LV function. Patient was treated medically for his coronary disease as his targets were felt to be poor and he did not wish to pursue further evaluation. Since last seen, He developed increasing dyspnea and edema 2 weeks ago. His Lasix was increased and his symptoms have now improved. He has not had chest pain, palpitations or syncope. He is not having orthostatic symptoms.  Current Outpatient Prescriptions  Medication Sig Dispense Refill  . acetaminophen (TYLENOL) 325 MG tablet Take 650 mg by mouth every 6 (six) hours as needed for mild pain.    Marland Kitchen aspirin 81 MG tablet Take 81 mg by mouth daily.    Marland Kitchen atorvastatin (LIPITOR) 40 MG tablet Take 20 mg by mouth daily. 1/2 tablet daily     . clopidogrel (PLAVIX) 75 MG tablet Take 1 tablet (75 mg total) by mouth daily. 90 tablet 3  . co-enzyme Q-10 30 MG capsule Take 30 mg by mouth daily.     Marland Kitchen doxycycline (VIBRAMYCIN) 100 MG capsule Take 1 capsule (100 mg total) by mouth 2 (two) times daily. 14 capsule 0  . fludrocortisone (FLORINEF) 0.1 MG tablet TAKE 1 TABLET BY MOUTH TWICE DAILY 60 tablet 2  . furosemide (LASIX) 20 MG tablet Take 1 tablet (20 mg  total) by mouth daily. 90 tablet 3  . gabapentin (NEURONTIN) 600 MG tablet TAKE 1 TABLET BY MOUTH 3 TIMES DAILY. 90 tablet 2  . glucagon 1 MG injection Inject 1 mg into the vein once as needed (Emergency Test Kit). Reported on 04/07/2015    . glucose blood test strip 1 each by Other route as directed. Use as instructed FREESTYLE IN VITRO    . HYDROcodone-acetaminophen (NORCO/VICODIN) 5-325 MG tablet Take 1 tablet by mouth every 6 (six) hours as needed for severe pain. 60 tablet 0  . insulin lispro (HUMALOG) 100 UNIT/ML injection Inject 1-40 Units into the skin as directed. Use in Insulin Pump, Max daily dose: 100 units/day    . isosorbide mononitrate (IMDUR) 60 MG 24 hr tablet Take 1 tablet (60 mg total) by mouth daily. 30 tablet 11  . lisinopril (PRINIVIL,ZESTRIL) 10 MG tablet Take 10 mg by mouth daily.  11  . meclizine (ANTIVERT) 12.5 MG tablet Take 1 tablet (12.5 mg total) by mouth 3 (three) times daily as needed for dizziness. 30 tablet 0  . midodrine (PROAMATINE) 10 MG tablet TAKE 1 TABLET BY MOUTH TWICE DAILY 60 tablet 2  . nitroGLYCERIN (NITROSTAT) 0.4 MG SL tablet Place 1 tablet (0.4 mg total) under the tongue every 5 (five) minutes x 3 doses as needed for chest pain. 25 tablet 12  . NONFORMULARY OR COMPOUNDED [place] compound:  Onychomycosis Nail lacquer - Fluconazole 2 %, Terbinafine 1%, DMSO, apply to affected  area daily. 120 each 2  . ranitidine (ZANTAC) 150 MG tablet TAKE 1 TABLET BY MOUTH TWICE DAILY 60 tablet 5  . SENEXON-S 8.6-50 MG tablet TAKE 1 TABLET BY MOUTH DAILY. 100 tablet 1  . zolpidem (AMBIEN CR) 12.5 MG CR tablet TAKE 1 TABLET BY MOUTH AT BEDTIME 30 tablet 2   No current facility-administered medications for this visit.      Past Medical History:  Diagnosis Date  . CAD (coronary artery disease)   . Cataracts, bilateral   . Chicken pox   . Contrast dye induced nephropathy   . Diabetes type 1, uncontrolled ([place])   . Diabetic retinopathy ([place])   .  Frequent headaches   . Heart attack 09.13.2014   x5  . Hyperlipidemia   . Measles, [place] (rubella)   . Mumps   . Orthostatic hypotension   . OSA (obstructive sleep apnea) 07/17/2013  . Scarlet fever     Past Surgical History:  Procedure Laterality Date  . APPENDECTOMY  1966  . CARDIAC CATHETERIZATION N/A 03/18/2015   Procedure: Left Heart Cath and Coronary Angiography;  Surgeon: Belva Crome, MD;  Location: [facility];  Service: Cardiovascular;  Laterality: N/A;  . CORONARY ANGIOPLASTY WITH STENT PLACEMENT    . REFRACTIVE SURGERY     Retinopathy  . WISDOM TOOTH EXTRACTION      Social History   Social History  . Marital status: Married    Spouse name: N/A  . Number of children: 1  . Years of education: N/A   Occupational History  . Not on file.   Social History Main Topics  . Smoking status: Never Smoker  . Smokeless tobacco: Never Used  . Alcohol use Yes     Comment: Rare  . Drug use: No  . Sexual activity: Not on file   Other Topics Concern  . 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  They have one grown daughter.   Highest level of education:  Master's Degree    Family History  Problem Relation Age of Onset  . Hypertension Mother 44    Deceased  . Other Mother     s/p PPM  . Vascular Disease Father 73    Deceased  . Transient ischemic attack Father   . Liver cancer Paternal Grandfather   . Breast cancer Sister   . Heart disease Sister     Ablation for rhythm disturbance  . Healthy Brother     x1    ROS: no fevers or chills, productive cough, hemoptysis, dysphasia, odynophagia, melena, hematochezia, dysuria, hematuria, rash, seizure activity, orthopnea, PND, pedal edema, claudication. Remaining systems are negative.  Physical Exam: Well-developed well-nourished in no acute distress.  Skin is warm and dry.  HEENT is normal.  Neck is supple.  Chest is clear to auscultation  with normal expansion.  Cardiovascular exam is regular rate and rhythm. 1/6 systolic murmur left sternal border. Abdominal exam nontender or distended. No masses palpated. Extremities show trace edema. neuro grossly intact  ECG-Sinus rhythm at a rate of 82. Left anterior fascicular block. Nonspecific T-wave changes.   A/P  1 Hyperlipidemia-continue statin. Liver functions have been mildly increased. Will recheck.   2 coronary artery disease-continue aspirin, Plavix and statin. As outlined previously his targets are not ideal for coronary artery bypass graft and he has stated previously he would not be agreeable to surgery regardless. Continue medical  therapy.  3 orthostatic hypotension-blood pressure is elevated today but he has not taken his medications yet. He states it typically runs 120-130/80. I will not advance medications given history of orthostasis. We will continue to follow.   4 Chronic diastolic CHF-patient's symptoms are much improved. Continue present dose of Lasix.Check potassium and renal function. He may need to see nephrology in the future. We discussed low sodium diet and fluid restriction.   Kirk Ruths, MD

## 2015-12-22 ENCOUNTER — Ambulatory Visit (HOSPITAL_BASED_OUTPATIENT_CLINIC_OR_DEPARTMENT_OTHER)
Admission: RE | Admit: 2015-12-22 | Discharge: 2015-12-22 | Disposition: A | Discharge status: Home / Self Care | Payer: Medicare Other | Source: Ambulatory Visit | Attending: Podiatry | Admitting: Podiatry

## 2015-12-22 ENCOUNTER — Ambulatory Visit (INDEPENDENT_AMBULATORY_CARE_PROVIDER_SITE_OTHER): Payer: Medicare Other | Admitting: Podiatry

## 2015-12-22 ENCOUNTER — Encounter: Payer: Self-pay | Admitting: Podiatry

## 2015-12-22 DIAGNOSIS — R52 Pain, unspecified: Secondary | ICD-10-CM

## 2015-12-22 DIAGNOSIS — S92354A Nondisplaced fracture of fifth metatarsal bone, right foot, initial encounter for closed fracture: Secondary | ICD-10-CM | POA: Insufficient documentation

## 2015-12-22 DIAGNOSIS — M79671 Pain in right foot: Secondary | ICD-10-CM | POA: Diagnosis present

## 2015-12-22 DIAGNOSIS — S92351D Displaced fracture of fifth metatarsal bone, right foot, subsequent encounter for fracture with routine healing: Secondary | ICD-10-CM

## 2015-12-22 DIAGNOSIS — X58XXXA Exposure to other specified factors, initial encounter: Secondary | ICD-10-CM | POA: Insufficient documentation

## 2015-12-22 NOTE — Progress Notes (Signed)
Subjective: 60 year old male presents the office in follow-up evaluation of right foot fifth metatarsal base fracture. He states he is not wearing the cam boot quite a bit of the time because she cannot drive and he does a lot of walking. He states he is having some pain in the outside his foot he is also in some pain in the outside aspect of his ankle. Again today he denies any recent injury or trauma. He is unsure of how he broke his foot. No new complaints today and no recent injury or trauma. Denies any systemic complaints such as fevers, chills, nausea, vomiting. No acute changes since last appointment, and no other complaints at this time.   Objective: AAO x3, NAD; presents today wearing a flat regular shoe. DP/PT pulses palpable bilaterally, CRT less than 3 seconds Protective sensation decreased with Simms Weinstein monofilament There is tenderness at the fifth metatarsal base and the right foot. There is also mild tenderness on the fibula which is new today. There is no pain or restrictions ankle joint range of motion. There does not appear to be any pain on the peroneal tendons. No other area pinpoint bony tenderness or pain the vibratory sensation. No edema, erythema, increase in warmth to bilateral lower extremities.  No open lesions or pre-ulcerative lesions.  No pain with calf compression, swelling, warmth, erythema  Assessment: 60 year old male right fifth metatarsal base fracture, possible fibular fracture  Plan: -All treatment options discussed with the patient including all alternatives, risks, complications.  -X-rays were reviewed with the patient. I showed him his x-rays today. The does appear to be some distraction across the fracture sites which is changes last appointment. Also in the lateral view questionable area on the fibula. We will order ankle x-rays as well. -I recommend continuing the CAM boot and limited activity. He states he cannot do this. Dispensed a surgical shoe  today. He states that he can wear this more than the boot. Discussed with him possible nonunion or malunion if not immobilized and he understands risks. -Follow-up in 3 weeks or sooner if needed. Call any questions or concerns in the meantime.  Ovid CurdMatthew Danetta Prom, DPM

## 2015-12-23 ENCOUNTER — Other Ambulatory Visit (INDEPENDENT_AMBULATORY_CARE_PROVIDER_SITE_OTHER): Payer: Medicare Other

## 2015-12-23 ENCOUNTER — Ambulatory Visit (INDEPENDENT_AMBULATORY_CARE_PROVIDER_SITE_OTHER): Payer: Medicare Other | Admitting: Cardiology

## 2015-12-23 ENCOUNTER — Telehealth: Payer: Self-pay | Admitting: *Deleted

## 2015-12-23 ENCOUNTER — Encounter: Payer: Self-pay | Admitting: Cardiology

## 2015-12-23 VITALS — BP 194/94 | HR 82 | Ht 66.0 in | Wt 193.4 lb

## 2015-12-23 DIAGNOSIS — R945 Abnormal results of liver function studies: Secondary | ICD-10-CM

## 2015-12-23 DIAGNOSIS — E784 Other hyperlipidemia: Secondary | ICD-10-CM | POA: Diagnosis not present

## 2015-12-23 DIAGNOSIS — R7989 Other specified abnormal findings of blood chemistry: Secondary | ICD-10-CM | POA: Diagnosis not present

## 2015-12-23 DIAGNOSIS — E7849 Other hyperlipidemia: Secondary | ICD-10-CM

## 2015-12-23 DIAGNOSIS — I2510 Atherosclerotic heart disease of native coronary artery without angina pectoris: Secondary | ICD-10-CM

## 2015-12-23 DIAGNOSIS — I951 Orthostatic hypotension: Secondary | ICD-10-CM | POA: Diagnosis not present

## 2015-12-23 LAB — HEPATIC FUNCTION PANEL
ALK PHOS: 109 U/L (ref 40–115)
ALT: 49 U/L — ABNORMAL HIGH (ref 9–46)
AST: 57 U/L — AB (ref 10–35)
Albumin: 3.7 g/dL (ref 3.6–5.1)
BILIRUBIN DIRECT: 0.1 mg/dL (ref <=0.2)
BILIRUBIN INDIRECT: 0.4 mg/dL (ref 0.2–1.2)
BILIRUBIN TOTAL: 0.5 mg/dL (ref 0.2–1.2)
Total Protein: 6.3 g/dL (ref 6.1–8.1)

## 2015-12-23 LAB — BASIC METABOLIC PANEL
BUN: 18 mg/dL (ref 7–25)
CO2: 24 mmol/L (ref 20–31)
Calcium: 8.5 mg/dL — ABNORMAL LOW (ref 8.6–10.3)
Chloride: 104 mmol/L (ref 98–110)
Creat: 1.22 mg/dL (ref 0.70–1.25)
Glucose, Bld: 313 mg/dL — ABNORMAL HIGH (ref 65–99)
POTASSIUM: 3.7 mmol/L (ref 3.5–5.3)
SODIUM: 139 mmol/L (ref 135–146)

## 2015-12-23 MED FILL — LATANOPROST 0.005% EYE DRP: 0.005 | 25 days supply | Qty: 3 | Fill #0

## 2015-12-23 MED FILL — LISINOPRIL 10 MG TABLET: 10 | 30 days supply | Qty: 30 | Fill #0

## 2015-12-23 NOTE — Telephone Encounter (Addendum)
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 Left message to call for results. 12/25/2015-informed pt of the x-ray results review. Pt states understanding and that the sandal given at the last visit would not stay fastened, and he would like to pick up another on 12/29/2015 afternoon. I informed Dr. Gabriel RungWagoner's assistant L. Cox, and K. Mavrakis and they [place]

## 2015-12-23 NOTE — Patient Instructions (Signed)

## 2015-12-24 ENCOUNTER — Ambulatory Visit (INDEPENDENT_AMBULATORY_CARE_PROVIDER_SITE_OTHER): Payer: Medicare Other | Admitting: Adult Health

## 2015-12-24 ENCOUNTER — Encounter: Payer: Self-pay | Admitting: Adult Health

## 2015-12-24 DIAGNOSIS — Z23 Encounter for immunization: Secondary | ICD-10-CM

## 2015-12-24 DIAGNOSIS — G4733 Obstructive sleep apnea (adult) (pediatric): Secondary | ICD-10-CM

## 2015-12-24 MED FILL — ZOLPIDEM TART ER 12.5 MG TA: 12.5 | 30 days supply | Qty: 30 | Fill #1

## 2015-12-24 NOTE — Progress Notes (Signed)
Subjective:    Patient ID: Timothy Alvarez, male    DOB: September 25, 1955, 60 y.o.   MRN: [id number]  HPI 60 year old male with moderate sleep apnea followed by Dr. Halford Chessman   TESTS: PSG 06/11/13 >> AHI 19, SaO2 low 77%  12/24/2015  Follow up : OSA  Patient returns for yearly follow-up for sleep apnea. Says overall he is doing well with his CPAP /  Wears C Pap machine 5-6- hours each night. Says he gets up and walks dog  And then goes back to bed -does put on CPAP .  He denies any significant daytime sleepiness. Download shows good compliance , avg usage 4hr . AHI 2.3. Min leaks.    Past Medical History:  Diagnosis Date  . CAD (coronary artery disease)   . Cataracts, bilateral   . Chicken pox   . Contrast dye induced nephropathy   . Diabetes type 1, uncontrolled ([place])   . Diabetic retinopathy ([place])   . Frequent headaches   . Heart attack 09.13.2014   x5  . Hyperlipidemia   . Measles, [place] (rubella)   . Mumps   . Orthostatic hypotension   . OSA (obstructive sleep apnea) 07/17/2013  . Scarlet fever    Current Outpatient Prescriptions on File Prior to Visit  Medication Sig Dispense Refill  . acetaminophen (TYLENOL) 325 MG tablet Take 650 mg by mouth every 6 (six) hours as needed for mild pain.    Marland Kitchen aspirin 81 MG tablet Take 81 mg by mouth daily.    Marland Kitchen atorvastatin (LIPITOR) 40 MG tablet Take 20 mg by mouth daily. 1/2 tablet daily     . clopidogrel (PLAVIX) 75 MG tablet Take 1 tablet (75 mg total) by mouth daily. 90 tablet 3  . co-enzyme Q-10 30 MG capsule Take 30 mg by mouth daily.     Marland Kitchen doxycycline (VIBRAMYCIN) 100 MG capsule Take 1 capsule (100 mg total) by mouth 2 (two) times daily. 14 capsule 0  . fludrocortisone (FLORINEF) 0.1 MG tablet TAKE 1 TABLET BY MOUTH TWICE DAILY 60 tablet 2  . furosemide (LASIX) 20 MG tablet Take 1 tablet (20 mg total) by mouth daily. 90 tablet 3  . gabapentin (NEURONTIN) 600 MG tablet TAKE 1 TABLET BY MOUTH 3 TIMES DAILY. 90 tablet 2  . glucagon 1 MG  injection Inject 1 mg into the vein once as needed (Emergency Test Kit). Reported on 04/07/2015    . glucose blood test strip 1 each by Other route as directed. Use as instructed FREESTYLE IN VITRO    . HYDROcodone-acetaminophen (NORCO/VICODIN) 5-325 MG tablet Take 1 tablet by mouth every 6 (six) hours as needed for severe pain. 60 tablet 0  . insulin lispro (HUMALOG) 100 UNIT/ML injection Inject 1-40 Units into the skin as directed. Use in Insulin Pump, Max daily dose: 100 units/day    . isosorbide mononitrate (IMDUR) 60 MG 24 hr tablet Take 1 tablet (60 mg total) by mouth daily. 30 tablet 11  . lisinopril (PRINIVIL,ZESTRIL) 10 MG tablet Take 10 mg by mouth daily.  11  . meclizine (ANTIVERT) 12.5 MG tablet Take 1 tablet (12.5 mg total) by mouth 3 (three) times daily as needed for dizziness. 30 tablet 0  . midodrine (PROAMATINE) 10 MG tablet TAKE 1 TABLET BY MOUTH TWICE DAILY 60 tablet 2  . nitroGLYCERIN (NITROSTAT) 0.4 MG SL tablet Place 1 tablet (0.4 mg total) under the tongue every 5 (five) minutes x 3 doses as needed for chest pain. 25 tablet 12  .  NONFORMULARY OR COMPOUNDED [place] compound:  Onychomycosis Nail lacquer - Fluconazole 2 %, Terbinafine 1%, DMSO, apply to affected area daily. 120 each 2  . ranitidine (ZANTAC) 150 MG tablet TAKE 1 TABLET BY MOUTH TWICE DAILY 60 tablet 5  . SENEXON-S 8.6-50 MG tablet TAKE 1 TABLET BY MOUTH DAILY. 100 tablet 1  . zolpidem (AMBIEN CR) 12.5 MG CR tablet TAKE 1 TABLET BY MOUTH AT BEDTIME 30 tablet 2   No current facility-administered medications on file prior to visit.      Review of Systems Constitutional:   No  weight loss, night sweats,  Fevers, chills, fatigue, or  lassitude.  HEENT:   No headaches,  Difficulty swallowing,  Tooth/dental problems, or  Sore throat,                No sneezing, itching, ear ache, nasal congestion, post nasal drip,   CV:  No chest pain,  Orthopnea, PND, swelling in lower extremities, anasarca,  dizziness, palpitations, syncope.   GI  No heartburn, indigestion, abdominal pain, nausea, vomiting, diarrhea, change in bowel habits, loss of appetite, bloody stools.   Resp: No shortness of breath with exertion or at rest.  No excess mucus, no productive cough,  No non-productive cough,  No coughing up of blood.  No change in color of mucus.  No wheezing.  No chest wall deformity  Skin: no rash or lesions.  GU: no dysuria, change in color of urine, no urgency or frequency.  No flank pain, no hematuria   MS:  No joint pain or swelling.  No decreased range of motion.  No back pain. +foot injury in wooden shoe   Psych:  No change in mood or affect. No depression or anxiety.  No memory loss.         Objective:   Physical Exam  Vitals:   12/24/15 1000  Pulse: 73  SpO2: 98%  Weight: 193 lb (87.5 kg)  Height: _0  (1.676 m)   Body mass index is 31.15 kg/m.   GEN: A/Ox3; pleasant , NAD, well nourished    HEENT:  [place]/AT,  EACs-clear, TMs-wnl, NOSE-clear, THROAT-clear, no lesions, no postnasal drip or exudate noted. Class 3 MP airway   NECK:  Supple w/ fair ROM; no JVD; normal carotid impulses w/o bruits; no thyromegaly or nodules palpated; no lymphadenopathy.    RESP  Clear  P & A; w/o, wheezes/ rales/ or rhonchi. no accessory muscle use, no dullness to percussion  CARD:  RRR, no m/r/g  , no peripheral edema, pulses intact, no cyanosis or clubbing.  GI:   Soft & nt; nml bowel sounds; no organomegaly or masses detected.   Musco: Warm bil, no deformities or joint swelling noted. Wooden shoe on right   Neuro: alert, no focal deficits noted.    Skin: Warm, no lesions or rashes     Syniyah Bourne NP-C  [facility]  12/24/2015

## 2015-12-24 NOTE — Patient Instructions (Signed)
Continue on CPAP At bedtime   Wear at least 6hr each night   Do not drive if sleepy.  Follow up Dr. Craige CottaSood  In 1 year and As needed   Flu shot today  Your blood pressure was elevated, follow up with your primary provider for recheck

## 2015-12-24 NOTE — Progress Notes (Signed)
Reviewed and agree with assessment/plan.  Coralyn HellingVineet Raesha Coonrod, MD [facility] 12/24/2015, 10:31 AM Pager:  [phone]

## 2015-12-24 NOTE — Assessment & Plan Note (Signed)
Controlled on CPAP   Plan  Patient Instructions  Continue on CPAP At bedtime   Wear at least 6hr each night   Do not drive if sleepy.  Follow up Dr. Craige CottaSood  In 1 year and As needed   Flu shot today  Your blood pressure was elevated, follow up with your primary provider for recheck

## 2015-12-25 ENCOUNTER — Ambulatory Visit: Payer: Medicare Other | Admitting: Pulmonary Disease

## 2016-01-01 MED FILL — FLUDROCORTISONE 0.1 MG TAB: 0.1 | 30 days supply | Qty: 60 | Fill #2

## 2016-01-01 MED FILL — ISOSORBIDE MN ER 60 MG TAB: 60 | 30 days supply | Qty: 30 | Fill #10

## 2016-01-01 MED FILL — MIDODRINE HCL 10 MG TABLET: 10 | 30 days supply | Qty: 60 | Fill #2

## 2016-01-06 MED FILL — GABAPENTIN 600 MG TABLET: 600 | 30 days supply | Qty: 90 | Fill #1

## 2016-01-08 ENCOUNTER — Telehealth: Payer: Self-pay | Admitting: Cardiology

## 2016-01-08 MED FILL — raNITIdine HCL 150 MG TABS: 150 | 30 days supply | Qty: 60 | Fill #1

## 2016-01-08 NOTE — Telephone Encounter (Signed)
New message      Pt c/o swelling: STAT is pt has developed SOB within 24 hours  1. How long have you been experiencing swelling?  Started yesterday 2. Where is the swelling located? Feet/ankles 3.  Are you currently taking a "fluid pill"? Yes---furosemide 20mg  bid 4.  Are you currently SOB?  no  5.  Have you traveled recently? No----pt does sit a lot

## 2016-01-08 NOTE — Telephone Encounter (Signed)
Spoke with pt, he noticed the slight swelling last night and it continues to be there this morning. He denies SOB or salt intake. He was given the okay to take an r=extra furosemide today. If he continues to have problems, he will let us know.

## 2016-01-12 ENCOUNTER — Ambulatory Visit (INDEPENDENT_AMBULATORY_CARE_PROVIDER_SITE_OTHER): Payer: Medicare Other | Admitting: Podiatry

## 2016-01-12 ENCOUNTER — Ambulatory Visit (HOSPITAL_BASED_OUTPATIENT_CLINIC_OR_DEPARTMENT_OTHER): Payer: Medicare Other

## 2016-01-12 ENCOUNTER — Telehealth: Payer: Self-pay | Admitting: *Deleted

## 2016-01-12 ENCOUNTER — Ambulatory Visit (HOSPITAL_BASED_OUTPATIENT_CLINIC_OR_DEPARTMENT_OTHER)
Admission: RE | Admit: 2016-01-12 | Discharge: 2016-01-12 | Disposition: A | Discharge status: Home / Self Care | Payer: Medicare Other | Source: Ambulatory Visit | Attending: Podiatry | Admitting: Podiatry

## 2016-01-12 ENCOUNTER — Other Ambulatory Visit (INDEPENDENT_AMBULATORY_CARE_PROVIDER_SITE_OTHER): Payer: Medicare Other

## 2016-01-12 ENCOUNTER — Encounter: Payer: Self-pay | Admitting: Podiatry

## 2016-01-12 DIAGNOSIS — X58XXXA Exposure to other specified factors, initial encounter: Secondary | ICD-10-CM | POA: Diagnosis not present

## 2016-01-12 DIAGNOSIS — B351 Tinea unguium: Secondary | ICD-10-CM | POA: Diagnosis not present

## 2016-01-12 DIAGNOSIS — M79675 Pain in left toe(s): Secondary | ICD-10-CM

## 2016-01-12 DIAGNOSIS — E559 Vitamin D deficiency, unspecified: Secondary | ICD-10-CM

## 2016-01-12 DIAGNOSIS — M2141 Flat foot [pes planus] (acquired), right foot: Secondary | ICD-10-CM | POA: Diagnosis not present

## 2016-01-12 DIAGNOSIS — R52 Pain, unspecified: Secondary | ICD-10-CM

## 2016-01-12 DIAGNOSIS — L84 Corns and callosities: Secondary | ICD-10-CM | POA: Diagnosis not present

## 2016-01-12 DIAGNOSIS — E1149 Type 2 diabetes mellitus with other diabetic neurological complication: Secondary | ICD-10-CM

## 2016-01-12 DIAGNOSIS — S92351D Displaced fracture of fifth metatarsal bone, right foot, subsequent encounter for fracture with routine healing: Secondary | ICD-10-CM | POA: Diagnosis not present

## 2016-01-12 DIAGNOSIS — S92424A Nondisplaced fracture of distal phalanx of right great toe, initial encounter for closed fracture: Secondary | ICD-10-CM | POA: Diagnosis not present

## 2016-01-12 DIAGNOSIS — S92351A Displaced fracture of fifth metatarsal bone, right foot, initial encounter for closed fracture: Secondary | ICD-10-CM | POA: Diagnosis not present

## 2016-01-12 DIAGNOSIS — I2510 Atherosclerotic heart disease of native coronary artery without angina pectoris: Secondary | ICD-10-CM | POA: Diagnosis not present

## 2016-01-12 DIAGNOSIS — M79671 Pain in right foot: Secondary | ICD-10-CM | POA: Diagnosis present

## 2016-01-12 DIAGNOSIS — M79674 Pain in right toe(s): Secondary | ICD-10-CM

## 2016-01-12 LAB — BASIC METABOLIC PANEL
BUN: 19 mg/dL (ref 6–23)
CALCIUM: 8.9 mg/dL (ref 8.4–10.5)
CHLORIDE: 102 meq/L (ref 96–112)
CO2: 29 meq/L (ref 19–32)
Creatinine, Ser: 1.21 mg/dL (ref 0.40–1.50)
GFR: 64.52 mL/min (ref >60.00)
Glucose, Bld: 341 mg/dL — ABNORMAL HIGH (ref 70–99)
POTASSIUM: 4.3 meq/L (ref 3.5–5.1)
SODIUM: 140 meq/L (ref 135–145)

## 2016-01-12 LAB — HEPATIC FUNCTION PANEL
ALK PHOS: 146 U/L — AB (ref 39–117)
ALT: 30 U/L (ref 0–53)
AST: 25 U/L (ref 0–37)
Albumin: 3.7 g/dL (ref 3.5–5.2)
BILIRUBIN TOTAL: 0.5 mg/dL (ref 0.2–1.2)
Bilirubin, Direct: 0.1 mg/dL (ref 0.0–0.3)
Total Protein: 6.5 g/dL (ref 6.0–8.3)

## 2016-01-12 LAB — HEMOGLOBIN A1C: HEMOGLOBIN A1C: 8.8 % — AB (ref 4.6–6.5)

## 2016-01-12 LAB — VITAMIN D 25 HYDROXY (VIT D DEFICIENCY, FRACTURES): VITD: 21.8 ng/mL — AB (ref 30.00–100.00)

## 2016-01-12 NOTE — Telephone Encounter (Addendum)
[facility] abnormal results call for right foot x-ray, shows fracture. Left message informing the results were available. 01/14/2016-Pt states Dr. Ardelle AntonWagoner mentioned spots on the bottom of his feet and he thought they were smaller than what he saw on the bottom of his feet with the mirror. Pt asked what he needed to do to take care of them since he is a diabetic. 01/15/2016-Left message informing pt of Dr. Gabriel RungWagoner's orders.

## 2016-01-12 NOTE — Progress Notes (Signed)
Subjective: 60 year old male presents the office in follow-up evaluation of right foot fifth metatarsal base fracture. He states this area is doing well but he did status toenail is big toe hurts quite a bit. He states that since he stubbed his right big toe history have increased swelling to his foot again. He has a states his nails are thickened elongated knees asking them to be translated tear causing irritation in his shoes. Continue the surgical shoe on the right foot. He does not wear it all the time. Denies any systemic complaints such as fevers, chills, nausea, vomiting. No acute changes since last appointment, and no other complaints at this time.   Objective: AAO x3, NAD; presents today wearing a flat regular shoe. DP/PT pulses palpable bilaterally, CRT less than 3 seconds Protective sensation decreased with Simms Weinstein monofilament There is decreased tenderness at the fifth metatarsal base and the right foot. He states it feels like it is a little "brusied". There is improved tenderness in the fibula or the ankle joint. There is no pain or crepitation with ankle joint range of motion. This is tenderness to the right hallux, which is new.  There is mild edema to the right foot. No erythema or increase in warmth. On the left foot there for very small punctate areas with superficial skin breakdown with evidence of dried blood. This appears to be small old blisters. There is no edema, erythema, drainage or any signs of infection. Nails are hypertrophic, dystrophic, brittle, discolored, elongated 10. No other open lesions or pre-ulcerative lesions bilaterally  No pain with calf compression, swelling, warmth, erythema  Assessment: 60 year old male right fifth metatarsal base fracture, hallux fracture; pre-ulcerative lesion left plantar foot; symptomatic onychomycosis.   Plan: -All treatment options discussed with the patient including all alternatives, risks, complications.  -X-rays  were reviewed with the patient.the dressing to be some evidence of healing along the right fifth metatarsal base. There is no fracture present hallux with extension to the IPJ. -Continue surgical shoe at all times. Compression wrap was also applied today. -Nails debrided 10 without complications or bleeding. Upon debridement the left hallux toenail nail did come off the any bleeding or complications otherwise. There does appear to be some dried blood underneath the 2nd and 3rd toenails which has been ongoing. -Monitor lesions of the left foot. If any worsening call the office. -Will recheck his vit D level  -Follow-up in 4 weeks or sooner if needed. Call any questions or concerns.  Timothy CurdMatthew Fines Alvarez, DPM

## 2016-01-13 ENCOUNTER — Other Ambulatory Visit: Payer: Medicare Other

## 2016-01-13 ENCOUNTER — Encounter: Payer: Self-pay | Admitting: Medical

## 2016-01-13 ENCOUNTER — Ambulatory Visit (INDEPENDENT_AMBULATORY_CARE_PROVIDER_SITE_OTHER): Payer: Medicare Other | Admitting: Medical

## 2016-01-13 VITALS — BP 130/80 | HR 78 | Temp 97.8°F | Ht 66.0 in | Wt 193.4 lb

## 2016-01-13 DIAGNOSIS — R7989 Other specified abnormal findings of blood chemistry: Secondary | ICD-10-CM

## 2016-01-13 DIAGNOSIS — S92901D Unspecified fracture of right foot, subsequent encounter for fracture with routine healing: Secondary | ICD-10-CM | POA: Diagnosis not present

## 2016-01-13 DIAGNOSIS — E1022 Type 1 diabetes mellitus with diabetic chronic kidney disease: Secondary | ICD-10-CM

## 2016-01-13 DIAGNOSIS — N189 Chronic kidney disease, unspecified: Secondary | ICD-10-CM

## 2016-01-13 MED ORDER — VITAMIN D (ERGOCALCIFEROL) 1.25 MG (50000 UNIT) PO CAPS: 50000.0000 [IU] | ORAL_CAPSULE | ORAL | 0 refills | Status: DC

## 2016-01-13 MED FILL — VIT D2 1.25 MG (50,000 UNIT: 1.25 MG | 42 days supply | Qty: 6 | Fill #0

## 2016-01-13 NOTE — Progress Notes (Signed)
Subjective:    Patient ID: Timothy Alvarez, male    DOB: 1955/11/28, 60 y.o.   MRN: [id number]  HPI  Pt in stating his urologist. Was a follow up for kidney stones. Pt on med to prevent kidney stones. Pt had psa in January, 2017 and was 1.25.   Pt has seen his endocrinologist. Pt saw endocrinologist just yesterday.Has appointment tomorrow to see diabetic specialist. Recent a1-c was high.  Pt had 2 fractures in his rt foot. Also states he stubbed his rt great toe recently. Pt has seen Dr. Jacqualyn Posey for both fractures. Pt in past had used 50,000 unit dose of vitamin D in the past. But has been some time ago.  Pt has rt  great toe pain is mild to moderate.   Review of Systems  Constitutional: Negative for chills, fatigue and fever.  Respiratory: Negative for cough, chest tightness, shortness of breath and wheezing.   Cardiovascular: Negative for chest pain and palpitations.  Gastrointestinal: Negative for abdominal pain, blood in stool, diarrhea and nausea.  Musculoskeletal: Negative for back pain, myalgias and neck stiffness.       Rt great toe pain.  Skin: Negative for rash.  Neurological: Negative for dizziness, seizures, speech difficulty, weakness, numbness and headaches.  Hematological: Negative for adenopathy. Does not bruise/bleed easily.  Psychiatric/Behavioral: Negative for agitation, behavioral problems and confusion.    Past Medical History:  Diagnosis Date  . CAD (coronary artery disease)   . Cataracts, bilateral   . Chicken pox   . Contrast dye induced nephropathy   . Diabetes type 1, uncontrolled ([place])   . Diabetic retinopathy ([place])   . Frequent headaches   . Heart attack 09.13.2014   x5  . Hyperlipidemia   . Measles, [place] (rubella)   . Mumps   . Orthostatic hypotension   . OSA (obstructive sleep apnea) 07/17/2013  . Scarlet fever      Social History   Social History  . Marital status: Married    Spouse name: N/A  . Number of children: 1  . Years of  education: N/A   Occupational History  . Not on file.   Social History Main Topics  . Smoking status: Never Smoker  . Smokeless tobacco: Never Used  . Alcohol use Yes     Comment: Rare  . Drug use: No  . Sexual activity: Not on file   Other Topics Concern  . 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  They have one grown daughter.   Highest level of education:  Master's Degree    Past Surgical History:  Procedure Laterality Date  . APPENDECTOMY  1966  . CARDIAC CATHETERIZATION N/A 03/18/2015   Procedure: Left Heart Cath and Coronary Angiography;  Surgeon: Belva Crome, MD;  Location: [facility];  Service: Cardiovascular;  Laterality: N/A;  . CORONARY ANGIOPLASTY WITH STENT PLACEMENT    . REFRACTIVE SURGERY     Retinopathy  . WISDOM TOOTH EXTRACTION      Family History  Problem Relation Age of Onset  . Hypertension Mother 73    Deceased  . Other Mother     s/p PPM  . Vascular Disease Father 27    Deceased  . Transient ischemic attack Father   . Liver cancer Paternal Grandfather   . Breast cancer Sister   . Heart disease Sister     Ablation for rhythm disturbance  .  Healthy Brother     x1    No Known Allergies  Current Outpatient Prescriptions on File Prior to Visit  Medication Sig Dispense Refill  . acetaminophen (TYLENOL) 325 MG tablet Take 650 mg by mouth every 6 (six) hours as needed for mild pain.    Marland Kitchen aspirin 81 MG tablet Take 81 mg by mouth daily.    Marland Kitchen atorvastatin (LIPITOR) 40 MG tablet Take 20 mg by mouth daily. 1/2 tablet daily     . clopidogrel (PLAVIX) 75 MG tablet Take 1 tablet (75 mg total) by mouth daily. 90 tablet 3  . co-enzyme Q-10 30 MG capsule Take 30 mg by mouth daily.     Marland Kitchen doxycycline (VIBRAMYCIN) 100 MG capsule Take 1 capsule (100 mg total) by mouth 2 (two) times daily. 14 capsule 0  . fludrocortisone (FLORINEF) 0.1 MG tablet TAKE 1 TABLET BY MOUTH TWICE DAILY 60  tablet 2  . furosemide (LASIX) 20 MG tablet Take 1 tablet (20 mg total) by mouth daily. (Patient taking differently: Take 20 mg by mouth 2 (two) times daily. ) 90 tablet 3  . gabapentin (NEURONTIN) 600 MG tablet TAKE 1 TABLET BY MOUTH 3 TIMES DAILY. 90 tablet 2  . glucagon 1 MG injection Inject 1 mg into the vein once as needed (Emergency Test Kit). Reported on 04/07/2015    . glucose blood test strip 1 each by Other route as directed. Use as instructed FREESTYLE IN VITRO    . HYDROcodone-acetaminophen (NORCO/VICODIN) 5-325 MG tablet Take 1 tablet by mouth every 6 (six) hours as needed for severe pain. 60 tablet 0  . insulin lispro (HUMALOG) 100 UNIT/ML injection Inject 1-40 Units into the skin as directed. Use in Insulin Pump, Max daily dose: 100 units/day    . isosorbide mononitrate (IMDUR) 60 MG 24 hr tablet Take 1 tablet (60 mg total) by mouth daily. 30 tablet 11  . lisinopril (PRINIVIL,ZESTRIL) 10 MG tablet Take 10 mg by mouth daily.  11  . meclizine (ANTIVERT) 12.5 MG tablet Take 1 tablet (12.5 mg total) by mouth 3 (three) times daily as needed for dizziness. 30 tablet 0  . midodrine (PROAMATINE) 10 MG tablet TAKE 1 TABLET BY MOUTH TWICE DAILY 60 tablet 2  . nitroGLYCERIN (NITROSTAT) 0.4 MG SL tablet Place 1 tablet (0.4 mg total) under the tongue every 5 (five) minutes x 3 doses as needed for chest pain. 25 tablet 12  . NONFORMULARY OR COMPOUNDED [place] compound:  Onychomycosis Nail lacquer - Fluconazole 2 %, Terbinafine 1%, DMSO, apply to affected area daily. 120 each 2  . ranitidine (ZANTAC) 150 MG tablet TAKE 1 TABLET BY MOUTH TWICE DAILY 60 tablet 5  . SENEXON-S 8.6-50 MG tablet TAKE 1 TABLET BY MOUTH DAILY. 100 tablet 1  . zolpidem (AMBIEN CR) 12.5 MG CR tablet TAKE 1 TABLET BY MOUTH AT BEDTIME 30 tablet 2   No current facility-administered medications on file prior to visit.     BP 130/80 (BP Location: Left Arm, Patient Position: Sitting)   Pulse 78   Temp 97.8 F  (36.6 C) (Oral)   Ht '5\' 6"'$  (1.676 m)   Wt 193 lb 6.4 oz (87.7 kg)   SpO2 98%   BMI 31.22 kg/m       Objective:   Physical Exam  General Mental Status- Alert. General Appearance- Not in acute distress.   Skin General: Color- Normal Color. Moisture- Normal Moisture.  Neck Carotid Arteries- Normal color. Moisture- Normal Moisture. No carotid bruits.  No JVD.  Chest and Lung Exam Auscultation: Breath Sounds:-Normal.  Cardiovascular Auscultation:Rythm- Regular. Murmurs & Other Heart Sounds:Auscultation of the heart reveals- No Murmurs.  Abdomen Inspection:-Inspeection Normal. Palpation/Percussion:Note:No mass. Palpation and Percussion of the abdomen reveal- Non Tender, Non Distended + BS, no rebound or guarding.   Neurologic Cranial Nerve exam:- CN III-XII intact(No nystagmus), symmetric smile. Strength:- 5/5 equal and symmetric strength both upper and lower extremities.  Rt foot- tender rt great toe. Over shoe palpation.      Assessment & Plan:  For your low vitamin d will rx ergocalciferol 50,000 unit tabs to take 1 tab a week. Will rx for 6 weeks and then repeat vitamin D level in 6-7 weeks.  For diabetes continue to see endocrinologist.  For foot fracture continue to see Dr. Jacqualyn Posey.  Follow up in 6-7 weeks for appointment and recheck vitamin D level.  Pt will follow up with urologist for his kidney stone.  Timothy Alvarez, Percell Miller, PA-C

## 2016-01-13 NOTE — Patient Instructions (Addendum)
For your low vitamin d will rx ergocalciferol 50,000 unit tabs to take 1 tab a week. Will rx for 6 weeks and then repeat vitamin D level in 6-7 weeks.  For diabetes continue to see endocrinologist.  For foot fracture continue to see Dr. Ardelle AntonWagoner.  Follow up in 6-7 weeks for appointment and recheck vitamin D level.

## 2016-01-13 NOTE — Addendum Note (Signed)
Addended by: Neldon LabellaMABE, Demarquez Ciolek S on: 01/13/2016 02:59 PM   Modules accepted: Orders

## 2016-01-13 NOTE — Progress Notes (Signed)
Pre visit review using our clinic review tool, if applicable. No additional management support is needed unless otherwise documented below in the visit note. 

## 2016-01-14 NOTE — Telephone Encounter (Signed)
Use a small amount of antibiotic ointment and band-aid. If they get any bigger to call the office.

## 2016-01-18 MED FILL — POTASSIUM CITRATE ER 15 MEQ: 15 MEQ | 30 days supply | Qty: 60 | Fill #3

## 2016-01-18 NOTE — Telephone Encounter (Addendum)
-----   Message from Vivi BarrackMatthew R Wagoner, DPM sent at 01/12/2016  3:30 PM EDT ----- Please have him follow-up with his primary care doctor for his vitamin D level being low. He has been taking vitamin D. 01/18/2016-Left information for pt to call with PCP and contact number and I would fax results.

## 2016-01-20 ENCOUNTER — Telehealth: Payer: Self-pay | Admitting: Cardiology

## 2016-01-20 MED ORDER — FUROSEMIDE 20 MG PO TABS: 20.0000 mg | ORAL_TABLET | Freq: Two times a day (BID) | ORAL | 6 refills | Status: DC

## 2016-01-20 MED FILL — FUROSEMIDE 20 MG TABLET: 20 | 30 days supply | Qty: 60 | Fill #0

## 2016-01-20 NOTE — Telephone Encounter (Signed)
Refill sent to the pharmacy electronically.  

## 2016-01-20 NOTE — Telephone Encounter (Signed)
LMTCB Per telephone message dated 01-08-16-debra:Spoke with pt, he noticed the slight swelling last night and it continues to be there this morning. He denies SOB or salt intake. He was given the okay to take an r=extra furosemide today. If he continues to have problems, he will let us know.

## 2016-01-20 NOTE — Telephone Encounter (Signed)
Pt is returning your call

## 2016-01-20 NOTE — Telephone Encounter (Signed)
Timothy Alvarez to know the new dosage for his lasix DR.Crenshaw upped his dosage last week

## 2016-01-20 NOTE — Telephone Encounter (Signed)
Spoke with pt states that he called back last week and was told to continue lasix bid until next appt and discuss then. Pt states that he has enough medication until next week but will need new rx next week. Ok to send refill for lasix BID?.Marland Kitchen

## 2016-01-22 MED FILL — ZOLPIDEM TART ER 12.5 MG TA: 12.5 | 30 days supply | Qty: 30 | Fill #2

## 2016-01-28 ENCOUNTER — Other Ambulatory Visit: Payer: Self-pay | Admitting: Physician Assistant

## 2016-01-28 MED FILL — MIDODRINE HCL 10 MG TABLET: 10 | 30 days supply | Qty: 60 | Fill #0

## 2016-01-28 MED FILL — FLUDROCORTISONE 0.1 MG TAB: 0.1 | 30 days supply | Qty: 60 | Fill #0

## 2016-01-28 MED FILL — ROSUVASTATIN CALCIUM 40 MG: 40 | 90 days supply | Qty: 90 | Fill #1

## 2016-01-29 MED FILL — LISINOPRIL 10 MG TABLET: 10 | 30 days supply | Qty: 30 | Fill #0

## 2016-02-02 ENCOUNTER — Ambulatory Visit: Payer: Medicare Other | Admitting: Podiatry

## 2016-02-02 MED FILL — ISOSORBIDE MN ER 60 MG TAB: 60 | 30 days supply | Qty: 30 | Fill #11

## 2016-02-09 ENCOUNTER — Ambulatory Visit (INDEPENDENT_AMBULATORY_CARE_PROVIDER_SITE_OTHER): Payer: Medicare Other | Admitting: Podiatry

## 2016-02-09 ENCOUNTER — Encounter: Payer: Self-pay | Admitting: Podiatry

## 2016-02-09 ENCOUNTER — Ambulatory Visit (HOSPITAL_BASED_OUTPATIENT_CLINIC_OR_DEPARTMENT_OTHER)
Admission: RE | Admit: 2016-02-09 | Discharge: 2016-02-09 | Disposition: A | Discharge status: Home / Self Care | Payer: Medicare Other | Source: Ambulatory Visit | Attending: Podiatry | Admitting: Podiatry

## 2016-02-09 ENCOUNTER — Telehealth: Payer: Self-pay | Admitting: Medical

## 2016-02-09 DIAGNOSIS — X58XXXA Exposure to other specified factors, initial encounter: Secondary | ICD-10-CM | POA: Insufficient documentation

## 2016-02-09 DIAGNOSIS — B351 Tinea unguium: Secondary | ICD-10-CM | POA: Diagnosis present

## 2016-02-09 DIAGNOSIS — S93324D Dislocation of tarsometatarsal joint of right foot, subsequent encounter: Secondary | ICD-10-CM | POA: Diagnosis not present

## 2016-02-09 DIAGNOSIS — E1042 Type 1 diabetes mellitus with diabetic polyneuropathy: Secondary | ICD-10-CM | POA: Diagnosis present

## 2016-02-09 DIAGNOSIS — Z794 Long term (current) use of insulin: Secondary | ICD-10-CM

## 2016-02-09 DIAGNOSIS — R52 Pain, unspecified: Secondary | ICD-10-CM

## 2016-02-09 DIAGNOSIS — I5043 Acute on chronic combined systolic (congestive) and diastolic (congestive) heart failure: Secondary | ICD-10-CM | POA: Diagnosis present

## 2016-02-09 DIAGNOSIS — E785 Hyperlipidemia, unspecified: Secondary | ICD-10-CM | POA: Diagnosis present

## 2016-02-09 DIAGNOSIS — S92811A Other fracture of right foot, initial encounter for closed fracture: Secondary | ICD-10-CM

## 2016-02-09 DIAGNOSIS — I214 Non-ST elevation (NSTEMI) myocardial infarction: Principal | ICD-10-CM | POA: Diagnosis present

## 2016-02-09 DIAGNOSIS — R0602 Shortness of breath: Secondary | ICD-10-CM | POA: Diagnosis not present

## 2016-02-09 DIAGNOSIS — M14671 Charcot's joint, right ankle and foot: Secondary | ICD-10-CM

## 2016-02-09 DIAGNOSIS — I2510 Atherosclerotic heart disease of native coronary artery without angina pectoris: Secondary | ICD-10-CM | POA: Diagnosis present

## 2016-02-09 DIAGNOSIS — Z7902 Long term (current) use of antithrombotics/antiplatelets: Secondary | ICD-10-CM

## 2016-02-09 DIAGNOSIS — Z7982 Long term (current) use of aspirin: Secondary | ICD-10-CM

## 2016-02-09 DIAGNOSIS — T508X5A Adverse effect of diagnostic agents, initial encounter: Secondary | ICD-10-CM | POA: Diagnosis present

## 2016-02-09 DIAGNOSIS — R791 Abnormal coagulation profile: Secondary | ICD-10-CM | POA: Diagnosis present

## 2016-02-09 DIAGNOSIS — I252 Old myocardial infarction: Secondary | ICD-10-CM

## 2016-02-09 DIAGNOSIS — G4733 Obstructive sleep apnea (adult) (pediatric): Secondary | ICD-10-CM | POA: Diagnosis present

## 2016-02-09 DIAGNOSIS — E10319 Type 1 diabetes mellitus with unspecified diabetic retinopathy without macular edema: Secondary | ICD-10-CM | POA: Diagnosis present

## 2016-02-09 DIAGNOSIS — Z955 Presence of coronary angioplasty implant and graft: Secondary | ICD-10-CM

## 2016-02-09 DIAGNOSIS — I951 Orthostatic hypotension: Secondary | ICD-10-CM | POA: Diagnosis present

## 2016-02-09 DIAGNOSIS — Z91041 Radiographic dye allergy status: Secondary | ICD-10-CM

## 2016-02-09 DIAGNOSIS — N141 Nephropathy induced by other drugs, medicaments and biological substances: Secondary | ICD-10-CM | POA: Diagnosis present

## 2016-02-09 DIAGNOSIS — E1022 Type 1 diabetes mellitus with diabetic chronic kidney disease: Secondary | ICD-10-CM | POA: Diagnosis present

## 2016-02-09 DIAGNOSIS — Z79899 Other long term (current) drug therapy: Secondary | ICD-10-CM

## 2016-02-09 DIAGNOSIS — E1065 Type 1 diabetes mellitus with hyperglycemia: Secondary | ICD-10-CM | POA: Diagnosis present

## 2016-02-09 DIAGNOSIS — Z8249 Family history of ischemic heart disease and other diseases of the circulatory system: Secondary | ICD-10-CM

## 2016-02-09 DIAGNOSIS — I130 Hypertensive heart and chronic kidney disease with heart failure and stage 1 through stage 4 chronic kidney disease, or unspecified chronic kidney disease: Secondary | ICD-10-CM | POA: Diagnosis present

## 2016-02-09 DIAGNOSIS — N183 Chronic kidney disease, stage 3 (moderate): Secondary | ICD-10-CM | POA: Diagnosis present

## 2016-02-09 NOTE — Telephone Encounter (Signed)
Patient Name: Timothy Alvarez  DOB: [birth year]    Initial Comment Caller States he has a low blood sugar been running from 109-150, and abd pain   Nurse Assessment  Nurse: Scarlette ArStandifer, RN, Heather Date/Time (Eastern Time): 02/09/2016 9:46:36 AM  Confirm and document reason for call. If symptomatic, describe symptoms. You must click the next button to save text entered. ---Caller States he has a low blood sugar been running from 109-150, and abd pain  Does the patient have any new or worsening symptoms? ---Yes  Will a triage be completed? ---Yes  Related visit to physician within the last 2 weeks? ---No  Does the PT have any chronic conditions? (i.e. diabetes, asthma, etc.) ---Yes  List chronic conditions. ---See MR  Is this a behavioral health or substance abuse call? ---No     Guidelines    Guideline Title Affirmed Question Affirmed Notes  Abdominal Pain - Upper [1] MODERATE pain (e.g., interferes with normal activities) AND [2] comes and goes (cramps) AND [3] present > 24 hours (Exception: pain with Vomiting or Diarrhea - see that Guideline)    Final Disposition User   See Physician within 24 Hours Standifer, RN, Research scientist (physical sciences)Heather    Comments  His blood sugar was 96 this morning and it stayed until he drank some milk and now it is gone completely  Appt with Ramon DredgeEdward tomorrow at 9 am.   Referrals  REFERRED TO PCP OFFICE   Disagree/Comply: Comply

## 2016-02-09 NOTE — Telephone Encounter (Signed)
Noted. Message routed to PCP for FYI.  

## 2016-02-10 ENCOUNTER — Ambulatory Visit (HOSPITAL_BASED_OUTPATIENT_CLINIC_OR_DEPARTMENT_OTHER)
Admission: RE | Admit: 2016-02-10 | Discharge: 2016-02-10 | Disposition: A | Discharge status: Home / Self Care | Payer: Medicare Other | Source: Ambulatory Visit | Attending: Medical | Admitting: Medical

## 2016-02-10 ENCOUNTER — Other Ambulatory Visit: Payer: Self-pay

## 2016-02-10 ENCOUNTER — Telehealth: Payer: Self-pay

## 2016-02-10 ENCOUNTER — Encounter (HOSPITAL_COMMUNITY): Payer: Self-pay | Admitting: *Deleted

## 2016-02-10 ENCOUNTER — Ambulatory Visit (INDEPENDENT_AMBULATORY_CARE_PROVIDER_SITE_OTHER): Payer: Medicare Other | Admitting: Medical

## 2016-02-10 ENCOUNTER — Emergency Department (HOSPITAL_COMMUNITY): Payer: Medicare Other

## 2016-02-10 ENCOUNTER — Encounter: Payer: Self-pay | Admitting: Medical

## 2016-02-10 ENCOUNTER — Inpatient Hospital Stay (HOSPITAL_BASED_OUTPATIENT_CLINIC_OR_DEPARTMENT_OTHER)
Admission: EM | Admit: 2016-02-10 | Discharge: 2016-02-13 | DRG: 280 | Disposition: A | Discharge status: Home / Self Care | Payer: Medicare Other | Attending: Cardiology | Admitting: Cardiology

## 2016-02-10 VITALS — BP 128/80 | HR 66 | Temp 98.0°F | Ht 66.0 in | Wt 188.0 lb

## 2016-02-10 DIAGNOSIS — R0602 Shortness of breath: Secondary | ICD-10-CM

## 2016-02-10 DIAGNOSIS — M25571 Pain in right ankle and joints of right foot: Secondary | ICD-10-CM

## 2016-02-10 DIAGNOSIS — R0600 Dyspnea, unspecified: Secondary | ICD-10-CM

## 2016-02-10 DIAGNOSIS — R109 Unspecified abdominal pain: Secondary | ICD-10-CM

## 2016-02-10 DIAGNOSIS — I214 Non-ST elevation (NSTEMI) myocardial infarction: Secondary | ICD-10-CM | POA: Diagnosis present

## 2016-02-10 DIAGNOSIS — I2510 Atherosclerotic heart disease of native coronary artery without angina pectoris: Secondary | ICD-10-CM

## 2016-02-10 DIAGNOSIS — I10 Essential (primary) hypertension: Secondary | ICD-10-CM | POA: Diagnosis present

## 2016-02-10 DIAGNOSIS — R7989 Other specified abnormal findings of blood chemistry: Secondary | ICD-10-CM

## 2016-02-10 DIAGNOSIS — R600 Localized edema: Secondary | ICD-10-CM

## 2016-02-10 DIAGNOSIS — E1129 Type 2 diabetes mellitus with other diabetic kidney complication: Secondary | ICD-10-CM | POA: Diagnosis present

## 2016-02-10 DIAGNOSIS — I509 Heart failure, unspecified: Secondary | ICD-10-CM

## 2016-02-10 DIAGNOSIS — R748 Abnormal levels of other serum enzymes: Secondary | ICD-10-CM

## 2016-02-10 DIAGNOSIS — R079 Chest pain, unspecified: Secondary | ICD-10-CM

## 2016-02-10 LAB — COMPREHENSIVE METABOLIC PANEL
ALK PHOS: 164 U/L — AB (ref 39–117)
ALT: 60 U/L — AB (ref 0–53)
AST: 50 U/L — ABNORMAL HIGH (ref 0–37)
Albumin: 3.7 g/dL (ref 3.5–5.2)
BILIRUBIN TOTAL: 0.6 mg/dL (ref 0.2–1.2)
BUN: 18 mg/dL (ref 6–23)
CO2: 34 mEq/L — ABNORMAL HIGH (ref 19–32)
Calcium: 9.2 mg/dL (ref 8.4–10.5)
Chloride: 101 mEq/L (ref 96–112)
Creatinine, Ser: 1.31 mg/dL (ref 0.40–1.50)
GFR: 58.86 mL/min — AB (ref >60.00)
GLUCOSE: 258 mg/dL — AB (ref 70–99)
Potassium: 4.9 mEq/L (ref 3.5–5.1)
SODIUM: 140 meq/L (ref 135–145)
TOTAL PROTEIN: 6.9 g/dL (ref 6.0–8.3)

## 2016-02-10 LAB — CBC WITH DIFFERENTIAL/PLATELET
BASOS ABS: 0 10*3/uL (ref 0.0–0.1)
Basophils Relative: 0.4 % (ref 0.0–3.0)
EOS ABS: 0.2 10*3/uL (ref 0.0–0.7)
Eosinophils Relative: 2.8 % (ref 0.0–5.0)
HCT: 36.3 % — ABNORMAL LOW (ref 39.0–52.0)
Hemoglobin: 12.2 g/dL — ABNORMAL LOW (ref 13.0–17.0)
LYMPHS ABS: 1.1 10*3/uL (ref 0.7–4.0)
Lymphocytes Relative: 17.3 % (ref 12.0–46.0)
MCHC: 33.5 g/dL (ref 30.0–36.0)
MCV: 84.3 fl (ref 78.0–100.0)
Monocytes Absolute: 0.6 10*3/uL (ref 0.1–1.0)
Monocytes Relative: 9 % (ref 3.0–12.0)
NEUTROS ABS: 4.7 10*3/uL (ref 1.4–7.7)
NEUTROS PCT: 70.5 % (ref 43.0–77.0)
PLATELETS: 260 10*3/uL (ref 150.0–400.0)
RBC: 4.31 Mil/uL (ref 4.22–5.81)
RDW: 14.4 % (ref 11.5–15.5)
WBC: 6.6 10*3/uL (ref 4.0–10.5)

## 2016-02-10 LAB — CBC
HCT: 34.7 % — ABNORMAL LOW (ref 39.0–52.0)
Hemoglobin: 11.4 g/dL — ABNORMAL LOW (ref 13.0–17.0)
MCH: 28.1 pg (ref 26.0–34.0)
MCHC: 32.9 g/dL (ref 30.0–36.0)
MCV: 85.5 fL (ref 78.0–100.0)
Platelets: 231 10*3/uL (ref 150–400)
RBC: 4.06 MIL/uL — ABNORMAL LOW (ref 4.22–5.81)
RDW: 13.6 % (ref 11.5–15.5)
WBC: 5.7 10*3/uL (ref 4.0–10.5)

## 2016-02-10 LAB — LIPASE: LIPASE: 11 U/L (ref 11.0–59.0)

## 2016-02-10 LAB — BASIC METABOLIC PANEL
Anion gap: 9 (ref 5–15)
BUN: 15 mg/dL (ref 6–20)
CALCIUM: 8.7 mg/dL — AB (ref 8.9–10.3)
CHLORIDE: 101 mmol/L (ref 101–111)
CO2: 28 mmol/L (ref 22–32)
CREATININE: 1.45 mg/dL — AB (ref 0.61–1.24)
GFR calc non Af Amer: 50 mL/min — ABNORMAL LOW (ref >60)
GFR, EST AFRICAN AMERICAN: 58 mL/min — AB (ref >60)
GLUCOSE: 154 mg/dL — AB (ref 65–99)
Potassium: 3.8 mmol/L (ref 3.5–5.1)
Sodium: 138 mmol/L (ref 135–145)

## 2016-02-10 LAB — AMYLASE: Amylase: 25 U/L — ABNORMAL LOW (ref 27–131)

## 2016-02-10 LAB — I-STAT TROPONIN, ED
Troponin i, poc: 2.28 ng/mL (ref 0.00–0.08)
Troponin i, poc: 2.19 ng/mL (ref 0.00–0.08)

## 2016-02-10 LAB — BRAIN NATRIURETIC PEPTIDE
B Natriuretic Peptide: 364.9 pg/mL — ABNORMAL HIGH (ref 0.0–100.0)
Pro B Natriuretic peptide (BNP): 477 pg/mL — ABNORMAL HIGH (ref 0.0–100.0)

## 2016-02-10 LAB — D-DIMER, QUANTITATIVE (NOT AT ARMC): D DIMER QUANT: 2.4 ug{FEU}/mL — AB (ref <0.50)

## 2016-02-10 LAB — TROPONIN I: TNIDX: 3.19 ug/l — ABNORMAL HIGH (ref 0.00–0.06)

## 2016-02-10 MED ORDER — HEPARIN (PORCINE) IN NACL 100-0.45 UNIT/ML-% IJ SOLN
1150.0000 [IU]/h | INTRAMUSCULAR | Status: DC
  Administered 2016-02-10: 1000 [IU]/h via INTRAVENOUS
  Filled 2016-02-10 (×2): qty 250

## 2016-02-10 MED ORDER — IOPAMIDOL (ISOVUE-370) INJECTION 76%: 100.0000 mL | Freq: Once | INTRAVENOUS | Status: DC | PRN

## 2016-02-10 MED ORDER — ASPIRIN 81 MG PO CHEW
324.0000 mg | CHEWABLE_TABLET | Freq: Once | ORAL | Status: AC
  Administered 2016-02-10: 324 mg via ORAL
  Filled 2016-02-10: qty 4

## 2016-02-10 MED ORDER — HEPARIN BOLUS VIA INFUSION
4000.0000 [IU] | Freq: Once | INTRAVENOUS | Status: AC
  Administered 2016-02-10: 4000 [IU] via INTRAVENOUS
  Filled 2016-02-10: qty 4000

## 2016-02-10 MED ORDER — GI COCKTAIL ~~LOC~~
30.0000 mL | Freq: Once | ORAL | Status: AC
  Administered 2016-02-10: 30 mL via ORAL

## 2016-02-10 NOTE — ED Notes (Signed)
Per bed placement, we will be holding patient in ED, no tele beds available at this time

## 2016-02-10 NOTE — Progress Notes (Signed)
ANTICOAGULATION CONSULT NOTE - Initial Consult  Pharmacy Consult for heparin Indication: chest pain/ACS  Allergies  Allergen Reactions  . Contrast Media [Iodinated Diagnostic Agents] Other (See Comments)    Renal Failure.     Patient Measurements: Height: 5\' 6"  (167.6 cm) Weight: 188 lb (85.3 kg) IBW/kg (Calculated) : 63.8 Heparin Dosing Weight: 81.4  Vital Signs: Temp: 98.3 F (36.8 C) (11/29 1859) Temp Source: Oral (11/29 1859) BP: 154/87 (11/29 2046) Pulse Rate: 78 (11/29 2046)  Labs:  Recent Labs  02/10/16 1030 02/10/16 1952  HGB 12.2* 11.4*  HCT 36.3* 34.7*  PLT 260.0 231  CREATININE 1.31 1.45*    Estimated Creatinine Clearance: 54.1 mL/min (by C-G formula based on SCr of 1.45 mg/dL (H)).   Medical History: Past Medical History:  Diagnosis Date  . CAD (coronary artery disease)   . Cataracts, bilateral   . Chicken pox   . Contrast dye induced nephropathy   . Diabetes type 1, uncontrolled ([facility])   . Diabetic retinopathy ([facility])   . Frequent headaches   . Heart attack 09.13.2014   x5  . Hyperlipidemia   . Measles, [place] ([place])   . Mumps   . Orthostatic hypotension   . OSA (obstructive sleep apnea) 07/17/2013  . Scarlet fever    Assessment: Austin Mileshomas F Moser is a 60 y.o. male  with complex medical  history including MI x7 since 2010. No ST elevation on EKG, however troponins 2.28. No anticoagulants PTA. Pharmacy has been asked to dose heparin.   Goal of Therapy:  Heparin level 0.3-0.7 units/ml Monitor platelets by anticoagulation protocol: Yes   Plan:  Give 4000 units bolus x 1 Start heparin infusion at 1000 units/hr Check anti-Xa level in 8 hours and daily while on heparin Continue to monitor H&H and platelets  Shyne Resch L Alissa Pharr 02/10/2016,9:17 PM

## 2016-02-10 NOTE — ED Notes (Signed)
MD aware of troponin

## 2016-02-10 NOTE — Progress Notes (Addendum)
Subjective:    Patient ID: Timothy Alvarez, male    DOB: Jul 22, 1955, 60 y.o.   MRN: [id number]  HPI  Pt in for abdomen pain for past 2-3 months.   Pt states he his abdomen pain. Pain abdomen presently. He belches a lot. Pt thinks some heartburn. Stomach upset for 4-5 months. Last 2 weeks painful. Pain immediatley after eating pain will decrease for about 30 minutes. No black or bloody stools. Pt has been on zantac 150 mg bid.  He also has cough intermittently. For 2 weeks. Dry cough, not productive.  Feels maybe sweats with exertion and some dyspneal. Pt feels like legs swelling recently. Pt cardiologist just doubled his lasix to 20 mg bid. Pt most recent echo looked good.  Pt hit his pod in his arm. He thinks it was not pumping insulin(at one point on Friday sugar was 400. He had high sugars that night. Then by Saturday his sugars came down to 150.  Pt also has rt foot pain. Pt has seen Dr. Jacqualyn Posey just yesterday. Pt has prescription of hydrocodone available if needed. Old prior fracture 8 weeks ago.   Review of Systems  Constitutional: Positive for diaphoresis. Negative for chills, fatigue and fever.       Mild sweating at times.  Respiratory: Positive for cough and shortness of breath. Negative for chest tightness and wheezing.        Friday night thought felt fluid and heard fluid in his lungs.  Cardiovascular: Negative for chest pain and palpitations.  Gastrointestinal: Positive for abdominal pain. Negative for abdominal distention, anal bleeding, blood in stool, constipation, diarrhea, nausea and vomiting.  Musculoskeletal: Negative for back pain and neck pain.  Skin: Negative for rash.  Neurological: Negative for dizziness and headaches.  Hematological: Negative for adenopathy. Does not bruise/bleed easily.  Psychiatric/Behavioral: Negative for behavioral problems and confusion.   Past Medical History:  Diagnosis Date  . CAD (coronary artery disease)   . Cataracts, bilateral     . Chicken pox   . Contrast dye induced nephropathy   . Diabetes type 1, uncontrolled ([place])   . Diabetic retinopathy ([place] [place])   . Frequent headaches   . Heart attack 09.13.2014   x5  . Hyperlipidemia   . Measles, [place] (rubella)   . Mumps   . Orthostatic hypotension   . OSA (obstructive sleep apnea) 07/17/2013  . Scarlet fever      Social History   Social History  . Marital status: Married    Spouse name: N/A  . Number of children: 1  . Years of education: N/A   Occupational History  . Not on file.   Social History Main Topics  . Smoking status: Never Smoker  . Smokeless tobacco: Never Used  . Alcohol use Yes     Comment: Rare  . Drug use: No  . Sexual activity: Not on file   Other Topics Concern  . 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  They have one grown daughter.   Highest level of education:  Master's Degree    Past Surgical History:  Procedure Laterality Date  . APPENDECTOMY  1966  . CARDIAC CATHETERIZATION N/A 03/18/2015   Procedure: Left Heart Cath and Coronary Angiography;  Surgeon: Belva Crome, MD;  Location: [facility];  Service: Cardiovascular;  Laterality: N/A;  . CORONARY ANGIOPLASTY WITH STENT PLACEMENT    . REFRACTIVE  SURGERY     Retinopathy  . WISDOM TOOTH EXTRACTION      Family History  Problem Relation Age of Onset  . Hypertension Mother 55    Deceased  . Other Mother     s/p PPM  . Vascular Disease Father 18    Deceased  . Transient ischemic attack Father   . Liver cancer Paternal Grandfather   . Breast cancer Sister   . Heart disease Sister     Ablation for rhythm disturbance  . Healthy Brother     x1    Allergies  Allergen Reactions  . Contrast Media [Iodinated Diagnostic Agents] Other (See Comments)    Renal Failure.     Current Outpatient Prescriptions on File Prior to Visit  Medication Sig Dispense Refill  . acetaminophen (TYLENOL) 325 MG  tablet Take 650 mg by mouth every 6 (six) hours as needed for mild pain.    Marland Kitchen aspirin 81 MG tablet Take 81 mg by mouth daily.    Marland Kitchen atorvastatin (LIPITOR) 40 MG tablet Take 20 mg by mouth daily. 1/2 tablet daily     . clopidogrel (PLAVIX) 75 MG tablet Take 1 tablet (75 mg total) by mouth daily. 90 tablet 3  . co-enzyme Q-10 30 MG capsule Take 30 mg by mouth daily.     Marland Kitchen doxycycline (VIBRAMYCIN) 100 MG capsule Take 1 capsule (100 mg total) by mouth 2 (two) times daily. 14 capsule 0  . fludrocortisone (FLORINEF) 0.1 MG tablet TAKE 1 TABLET BY MOUTH TWICE DAILY 60 tablet 3  . furosemide (LASIX) 20 MG tablet Take 1 tablet (20 mg total) by mouth 2 (two) times daily. 60 tablet 6  . gabapentin (NEURONTIN) 600 MG tablet TAKE 1 TABLET BY MOUTH 3 TIMES DAILY. 90 tablet 2  . glucagon 1 MG injection Inject 1 mg into the vein once as needed (Emergency Test Kit). Reported on 04/07/2015    . glucose blood test strip 1 each by Other route as directed. Use as instructed FREESTYLE IN VITRO    . HYDROcodone-acetaminophen (NORCO/VICODIN) 5-325 MG tablet Take 1 tablet by mouth every 6 (six) hours as needed for severe pain. 60 tablet 0  . insulin lispro (HUMALOG) 100 UNIT/ML injection Inject 1-40 Units into the skin as directed. Use in Insulin Pump, Max daily dose: 100 units/day    . isosorbide mononitrate (IMDUR) 60 MG 24 hr tablet Take 1 tablet (60 mg total) by mouth daily. 30 tablet 11  . lisinopril (PRINIVIL,ZESTRIL) 10 MG tablet Take 10 mg by mouth daily.  11  . meclizine (ANTIVERT) 12.5 MG tablet Take 1 tablet (12.5 mg total) by mouth 3 (three) times daily as needed for dizziness. 30 tablet 0  . midodrine (PROAMATINE) 10 MG tablet TAKE 1 TABLET BY MOUTH TWICE DAILY 60 tablet 3  . nitroGLYCERIN (NITROSTAT) 0.4 MG SL tablet Place 1 tablet (0.4 mg total) under the tongue every 5 (five) minutes x 3 doses as needed for chest pain. 25 tablet 12  . NONFORMULARY OR COMPOUNDED [place] compound:  Onychomycosis  Nail lacquer - Fluconazole 2 %, Terbinafine 1%, DMSO, apply to affected area daily. 120 each 2  . ranitidine (ZANTAC) 150 MG tablet TAKE 1 TABLET BY MOUTH TWICE DAILY 60 tablet 5  . SENEXON-S 8.6-50 MG tablet TAKE 1 TABLET BY MOUTH DAILY. 100 tablet 1  . Vitamin D, Ergocalciferol, (DRISDOL) 50000 units CAPS capsule Take 1 capsule (50,000 Units total) by mouth every 7 (seven) days. 6 capsule 0  .  zolpidem (AMBIEN CR) 12.5 MG CR tablet TAKE 1 TABLET BY MOUTH AT BEDTIME 30 tablet 2   No current facility-administered medications on file prior to visit.     BP 128/80 (BP Location: Left Arm, Patient Position: Sitting, Cuff Size: Large)   Pulse 66   Temp 98 F (36.7 C) (Oral)   Ht 5' 6"  (1.676 m)   Wt 188 lb (85.3 kg)   SpO2 97%   BMI 30.34 kg/m        Objective:   Physical Exam  General Mental Status- Alert. General Appearance- Not in acute distress.   Skin General: Color- Normal Color. Moisture- Normal Moisture.  Neck Carotid Arteries- Normal color. Moisture- Normal Moisture. No carotid bruits. No JVD.  Chest and Lung Exam Auscultation: Breath Sounds:-Normal.  Cardiovascular Auscultation:Rythm- Regular. Murmurs & Other Heart Sounds:Auscultation of the heart reveals- No Murmurs.  Abdomen Inspection:-Inspeection Normal. Palpation/Percussion:Note:No mass. Palpation and Percussion of the abdomen reveal- Non Tender, Non Distended + BS, no rebound or guarding.  Rectal-heme negative stool test.   Neurologic Cranial Nerve exam:- CN III-XII intact(No nystagmus), symmetric smile. Strength:- 5/5 equal and symmetric strength both upper and lower extremities.  Lower ext- no pedal edema. Negative homans sing. Rt side difficult to test since has boot.    Assessment & Plan:  For your abdomen pain continue the ranitidine. GI cocktail added today in office to give some relief.wanted to add ppi but ineraction with plavix.  Will get stat labs for your abdomen pain. If labs negative  and pain persist then refer you to GI. If pain severe and after hours then ED evaluation.(note cough associated with belch could be reflux) GI cocktail given in office to see if you get some relief.  For cough and dyspnea will get bnp and cxr. Will get d-dimer if elevated then consider lower ext dopplers and ct of the chest. Troponin as well based on cardiac history.  For pain in foot can use your hydrocodone which you have.   Follow up on Friday early am or as needed.  I talked with pt wife later today since pt told CT tech he had severe allergy to contrast dye in past with kidney failure. Pt told CT tech he did not want the ct contrast study done. I talked with wife and I clarified with her his degree of shortness of breath. She states even with short walks/couple of steps would get very short of breath. Since his d-dimer was elevated and he refused ct contrast study I advised wife to take him to [facility] main ED. Show them avs and mention about his dyspnea and elevated d-dimer. VQ scan could potentially be done but not at our med -center. Wife agreed to take him to ED. I gave my cell number so she could call if at ED/triage any delay in getting him back.   Below is not I sent to [facility] after hours late in day after reviewing all my labs. Ashlee, I was busy all day yesterday and you notified me of the d-dimer results. I immediately set up pt to have ct chest and [place] of lower extremity right after lunch(to evaluate for PE). Worked straight through lunch. I did not check his labs all day due to seeing  pts back to back. Pt went for downstairs for CT but he did not want to contrast study due to reaction to contrast study in the past. So I sent him to ED to possibly get vq scan. I was unaware  his troponin was elevated until lat on wed night after hours. Busy all day and did not see troponin was resulted. Did lab tell you his troponin was elevated., All I heard was his d-dimer was elevated. I need to know if  lab advised/informed you on the troponin. If they did not we need to advise them about this. Pt did go to ED as I advised. Please let me know about this. Super important.  Josearmando Kuhnert, Percell Miller, PA-C

## 2016-02-10 NOTE — Telephone Encounter (Signed)
Will you call pt and have him arrange to get lower ext US and ct chest. His d-dimer was elevated. So these test need to be done stat. He needs to wait downstairs while results called to me. If can't get him in quickly/ within 1 hour or so let me know. Make sure his dysnea not worse. If so/worse then ED evaluation.

## 2016-02-10 NOTE — ED Provider Notes (Signed)
Timothy Alvarez Provider Note   CSN: [id number] Arrival date & time: 02/10/16  1845     History   Chief Complaint Chief Complaint  Patient presents with  . Leg Swelling  . Shortness of Breath    HPI Timothy Alvarez is a 60 y.o. male.  HPI This 60 year old man with a history of insulin-dependent diabetes and coronary artery disease as well as multiple MIs who presents today stating that he was sent here from [place]. He states that he has had some crease peripheral edema over the past several weeks. He also was hyperglycemic on Friday secondary to  his insulin pump not working correctly. He states this has been corrected. He has had some substernal burning which he describes as heartburn intermittently over the past several days. He is not currently having it last had it earlier today. He states that he was seen in his primary care doctor at [facility] [place]. They did Dopplers of his lower extremities that did not show blood clot. However, he did have an elevated d-dimer and he was told that he needed to come here for further evaluation for a blood clot in his lungs. He states that he has had MIs before but he did not have chest pain. He states that the burning he has had his chest does not like any pain he had with previous MI. Dr. Tyrone Nine saw initially and was concerned for st elevation in 1 and avR.  He discussed with stemi MD and felt patient did not meet criteria for stemi. Past Medical History:  Diagnosis Date  . CAD (coronary artery disease)   . Cataracts, bilateral   . Chicken pox   . Contrast dye induced nephropathy   . Diabetes type 1, uncontrolled ([place])   . Diabetic retinopathy ([place])   . Frequent headaches   . Heart attack 09.13.2014   x5  . Hyperlipidemia   . Measles, [place] (rubella)   . Mumps   . Orthostatic hypotension   . OSA (obstructive sleep apnea) 07/17/2013  . Scarlet fever     Patient Active Problem List   Diagnosis Date Noted  . Foot  ulcer, left ([place]) 11/06/2015  . Disability examination 07/26/2015  . Edema 07/01/2015  . Hematuria 04/30/2015  . Gastroesophageal reflux disease without esophagitis 04/22/2015  . NSTEMI (non-ST elevated myocardial infarction) ([place]) 03/18/2015  . Severe comorbid illness   . SOB (shortness of breath) 08/12/2014  . Fatigue 02/03/2014  . OSA (obstructive sleep apnea) 07/17/2013  . Bruit 05/08/2013  . 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 Chronic pain syndrome 04/07/2013  . CAD S/P percutaneous coronary angioplasty 04/07/2013  . Neuropathy ([place]) 04/07/2013  . Orthostatic hypotension 04/07/2013  . Type 2 diabetes mellitus with renal manifestations ([place]) 04/07/2013  . Depression 04/07/2013  . Insomnia 04/07/2013  . Hyperlipidemia 04/07/2013  . Encounter to establish care 04/07/2013    Past Surgical History:  Procedure Laterality Date  . APPENDECTOMY  1966  . CARDIAC CATHETERIZATION N/A 03/18/2015   Procedure: Left Heart Cath and Coronary Angiography;  Surgeon: Belva Crome, MD;  Location: [facility];  Service: Cardiovascular;  Laterality: N/A;  . CORONARY ANGIOPLASTY WITH STENT PLACEMENT    . REFRACTIVE SURGERY     Retinopathy  . WISDOM TOOTH EXTRACTION         Home Medications    Prior to Admission medications   Medication Sig Start Date End Date Taking? Authorizing Provider  acetaminophen (TYLENOL) 325 MG  tablet Take 650 mg by mouth every 6 (six) hours as needed for mild pain.    Historical Provider, MD  aspirin 81 MG tablet Take 81 mg by mouth daily.    Historical Provider, MD  atorvastatin (LIPITOR) 40 MG tablet Take 20 mg by mouth daily. 1/2 tablet daily     Historical Provider, MD  clopidogrel (PLAVIX) 75 MG tablet Take 1 tablet (75 mg total) by mouth daily. 03/27/15   Erlene Quan, PA-C  co-enzyme Q-10 30 MG capsule Take 30 mg by mouth daily.     Historical Provider, MD  doxycycline (VIBRAMYCIN) 100 MG capsule Take 1 capsule (100 mg total) by mouth 2  (two) times daily. 10/05/15   Brunetta Jeans, PA-C  fludrocortisone (FLORINEF) 0.1 MG tablet TAKE 1 TABLET BY MOUTH TWICE DAILY 01/28/16   Brunetta Jeans, PA-C  furosemide (LASIX) 20 MG tablet Take 1 tablet (20 mg total) by mouth 2 (two) times daily. 01/20/16   Lelon Perla, MD  gabapentin (NEURONTIN) 600 MG tablet TAKE 1 TABLET BY MOUTH 3 TIMES DAILY. 11/13/15   Brunetta Jeans, PA-C  glucagon 1 MG injection Inject 1 mg into the vein once as needed (Emergency Test Kit). Reported on 04/07/2015    Historical Provider, MD  glucose blood test strip 1 each by Other route as directed. Use as instructed FREESTYLE IN VITRO    Historical Provider, MD  HYDROcodone-acetaminophen (NORCO/VICODIN) 5-325 MG tablet Take 1 tablet by mouth every 6 (six) hours as needed for severe pain. 04/22/15   Brunetta Jeans, PA-C  insulin lispro (HUMALOG) 100 UNIT/ML injection Inject 1-40 Units into the skin as directed. Use in Insulin Pump, Max daily dose: 100 units/day    Historical Provider, MD  isosorbide mononitrate (IMDUR) 60 MG 24 hr tablet Take 1 tablet (60 mg total) by mouth daily. 03/21/15   Bhavinkumar Bhagat, PA  lisinopril (PRINIVIL,ZESTRIL) 10 MG tablet Take 10 mg by mouth daily. 03/13/15   Historical Provider, MD  meclizine (ANTIVERT) 12.5 MG tablet Take 1 tablet (12.5 mg total) by mouth 3 (three) times daily as needed for dizziness. 02/27/14   Percell Miller Saguier, PA-C  midodrine (PROAMATINE) 10 MG tablet TAKE 1 TABLET BY MOUTH TWICE DAILY 01/28/16   Brunetta Jeans, PA-C  nitroGLYCERIN (NITROSTAT) 0.4 MG SL tablet Place 1 tablet (0.4 mg total) under the tongue every 5 (five) minutes x 3 doses as needed for chest pain. 03/20/15   Leanor Kail, PA  NONFORMULARY OR COMPOUNDED [place] compound:  Onychomycosis Nail lacquer - Fluconazole 2 %, Terbinafine 1%, DMSO, apply to affected area daily. 09/25/15   Trula Slade, DPM  ranitidine (ZANTAC) 150 MG tablet TAKE 1 TABLET BY MOUTH TWICE DAILY 12/09/15    Brunetta Jeans, PA-C  SENEXON-S 8.6-50 MG tablet TAKE 1 TABLET BY MOUTH DAILY. 02/13/15   Brunetta Jeans, PA-C  Vitamin D, Ergocalciferol, (DRISDOL) 50000 units CAPS capsule Take 1 capsule (50,000 Units total) by mouth every 7 (seven) days. 01/13/16   Edward Saguier, PA-C  zolpidem (AMBIEN CR) 12.5 MG CR tablet TAKE 1 TABLET BY MOUTH AT BEDTIME 11/19/15   Darreld Mclean, MD    Family History Family History  Problem Relation Age of Onset  . Hypertension Mother 47    Deceased  . Other Mother     s/p PPM  . Vascular Disease Father 38    Deceased  . Transient ischemic attack Father   . Liver cancer Paternal Grandfather   .  Breast cancer Sister   . Heart disease Sister     Ablation for rhythm disturbance  . Healthy Brother     x1    Social History Social History  Substance Use Topics  . Smoking status: Never Smoker  . Smokeless tobacco: Never Used  . Alcohol use Yes     Comment: Rare     Allergies   Contrast media [iodinated diagnostic agents]   Review of Systems Review of Systems  All other systems reviewed and are negative.    Physical Exam Updated Vital Signs BP 154/87   Pulse 78   Temp 98.3 F (36.8 C) (Oral)   Resp 13   Ht 5\' 6"  (1.676 m)   Wt 85.3 kg   SpO2 100%   BMI 30.34 kg/m   Physical Exam  Constitutional: He is oriented to person, place, and time. He appears well-developed and well-nourished.  HENT:  Head: Normocephalic and atraumatic.  Right Ear: External ear normal.  Left Ear: External ear normal.  Eyes: Conjunctivae and EOM are normal. Pupils are equal, round, and reactive to light.  Neck: Normal range of motion.  Cardiovascular: Normal rate, regular rhythm and normal heart sounds.   Pulmonary/Chest: Effort normal and breath sounds normal.  Abdominal: Soft. Bowel sounds are normal.  Musculoskeletal: He exhibits edema.  Neurological: He is alert and oriented to person, place, and time.  Skin: Skin is warm.  Psychiatric: He has a  normal mood and affect.  Nursing note and vitals reviewed.    ED Treatments / Results  Labs (all labs ordered are listed, but only abnormal results are displayed) Labs Reviewed  CBC - Abnormal; Notable for the following:       Result Value   RBC 4.06 (*)    Hemoglobin 11.4 (*)    HCT 34.7 (*)    All other components within normal limits  BASIC METABOLIC PANEL - Abnormal; Notable for the following:    Glucose, Bld 154 (*)    Creatinine, Ser 1.45 (*)    Calcium 8.7 (*)    GFR calc non Af Amer 50 (*)    GFR calc Af Amer 58 (*)    All other components within normal limits  BRAIN NATRIURETIC PEPTIDE - Abnormal; Notable for the following:    B Natriuretic Peptide 364.9 (*)    All other components within normal limits  I-STAT TROPOININ, ED - Abnormal; Notable for the following:    Troponin i, poc 2.28 (*)    All other components within normal limits    EKG  EKG Interpretation  Date/Time:  Wednesday February 10 2016 18:54:56 EST Ventricular Rate:  81 PR Interval:  182 QRS Duration: 94 QT Interval:  386 QTC Calculation: 448 R Axis:   -88 Text Interpretation:  Normal sinus rhythm Left axis deviation Nonspecific ST and T wave abnormality Abnormal ECG ST depressions in the inferior leads with <33mm elevation in I, aVL Confirmed by FLOYD MD, DANIEL [phone]) on 02/10/2016 7:11:29 PM       Radiology Dg Chest 2 View  Result Date: 02/10/2016 CLINICAL DATA:  Mid sternal chest pain increased shortness of breath EXAM: CHEST  2 VIEW COMPARISON:  02/10/2016 FINDINGS: Mild diffuse interstitial prominence but without focal infiltrate or effusion. Normal cardiomediastinal silhouette. No pneumothorax. IMPRESSION: No active cardiopulmonary disease. Electronically Signed   By: 02/12/2016 M.D.   On: 02/10/2016 20:37   Dg Chest 2 View  Result Date: 02/10/2016 CLINICAL DATA:  Dry, nonproductive cough for 2 weeks.  Shortness of breath. EXAM: CHEST  2 VIEW COMPARISON:  03/17/2015 FINDINGS: Heart  and mediastinal contours are within normal limits. No focal opacities or effusions. No acute bony abnormality. IMPRESSION: No active cardiopulmonary disease. Electronically Signed   By: Rolm Baptise M.D.   On: 02/10/2016 11:19   US Venous Img Lower Bilateral  Result Date: 02/10/2016 CLINICAL DATA:  Bilateral lower extremity edema EXAM: BILATERAL LOWER EXTREMITY VENOUS DUPLEX ULTRASOUND TECHNIQUE: Gray-scale sonography with graded compression, as well as color Doppler and duplex ultrasound were performed to evaluate the lower extremity deep venous systems from the level of the common femoral vein and including the common femoral, femoral, profunda femoral, popliteal and calf veins including the posterior tibial, peroneal and gastrocnemius veins when visible. The superficial great saphenous vein was also interrogated. Spectral Doppler was utilized to evaluate flow at rest and with distal augmentation maneuvers in the common femoral, femoral and popliteal veins. COMPARISON:  None. FINDINGS: RIGHT LOWER EXTREMITY Common Femoral Vein: No evidence of thrombus. Normal compressibility, respiratory phasicity and response to augmentation. Saphenofemoral Junction: No evidence of thrombus. Normal compressibility and flow on color Doppler imaging. Profunda Femoral Vein: No evidence of thrombus. Normal compressibility and flow on color Doppler imaging. Femoral Vein: No evidence of thrombus. Normal compressibility, respiratory phasicity and response to augmentation. Popliteal Vein: No evidence of thrombus. Normal compressibility, respiratory phasicity and response to augmentation. Calf Veins: No evidence of thrombus. Normal compressibility and flow on color Doppler imaging. Superficial Great Saphenous Vein: No evidence of thrombus. Normal compressibility and flow on color Doppler imaging. Venous Reflux:  None. Other Findings:  None. LEFT LOWER EXTREMITY Common Femoral Vein: No evidence of thrombus. Normal compressibility,  respiratory phasicity and response to augmentation. Saphenofemoral Junction: No evidence of thrombus. Normal compressibility and flow on color Doppler imaging. Profunda Femoral Vein: No evidence of thrombus. Normal compressibility and flow on color Doppler imaging. Femoral Vein: No evidence of thrombus. Normal compressibility, respiratory phasicity and response to augmentation. Popliteal Vein: No evidence of thrombus. Normal compressibility, respiratory phasicity and response to augmentation. Calf Veins: No evidence of thrombus. Normal compressibility and flow on color Doppler imaging. Superficial Great Saphenous Vein: No evidence of thrombus. Normal compressibility and flow on color Doppler imaging. Venous Reflux:  None. Other Findings:  None. IMPRESSION: No evidence of deep venous thrombosis in either lower extremity. Electronically Signed   By: Lowella Grip III M.D.   On: 02/10/2016 17:08   Dg Foot Complete Right  Result Date: 02/09/2016 CLINICAL DATA:  Multiple recent injuries.  Pain in swelling. EXAM: RIGHT FOOT COMPLETE - 3+ VIEW COMPARISON:  Multiple prior foot films dating back to 12/01/2015 FINDINGS: Findings consistent with a Lisfranc fracture dislocation involving the right foot. There is mild lateral subluxation of the second metatarsal base and moderate medial subluxation of the first metatarsal. There appear to be small fractures at the metatarsal bases along with a chronic fracture the base of the fifth metatarsal. There may be a Charcot component given the small vessel calcifications. CT suggested for further evaluation. 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 CT suggested for further evaluation. Electronically Signed   By: Marijo Sanes M.D.   On: 02/09/2016 13:17    Procedures Procedures (including critical care time)  Medications Ordered in ED Medications  heparin bolus via  infusion 4,000 Units (not administered)  heparin ADULT infusion 100 units/mL (25000 units/270m sodium chloride 0.45%) (not administered)  aspirin chewable tablet 324 mg (324  mg Oral Given 02/10/16 1956)     Initial Impression / Assessment and Plan / ED Course  I have reviewed the triage vital signs and the nursing notes.  Pertinent labs & imaging results that were available during my care of the patient were reviewed by me and considered in my medical decision making (see chart for details).  Clinical Course     Patient with elevated troponin.  Discussed with Dr. Elson Areas- given patient's history of renal insufficiency and allergic reaction to dye plan to treat as nstemi. Heparin ordered.  Discussed with patient and agrees with plan.  Dr. Elson Areas at bedside.   CRITICAL CARE Performed by: Shaune Pollack Total critical care time: 45 minutes Critical care time was exclusive of separately billable procedures and treating other patients. Critical care was necessary to treat or prevent imminent or life-threatening deterioration. Critical care was time spent personally by me on the following activities: development of treatment plan with patient and/or surrogate as well as nursing, discussions with consultants, evaluation of patient's response to treatment, examination of patient, obtaining history from patient or surrogate, ordering and performing treatments and interventions, ordering and review of laboratory studies, ordering and review of radiographic studies, pulse oximetry and re-evaluation of patient's condition.  Final Clinical Impressions(s) / ED Diagnoses   Final diagnoses:  NSTEMI (non-ST elevated myocardial infarction) [facility])    New Prescriptions New Prescriptions   No medications on file     Pattricia Boss, MD 02/10/16 2137

## 2016-02-10 NOTE — ED Provider Notes (Signed)
MSE was initiated and I personally evaluated and placed orders (if any) at 7:03 PM on 02/10/2016   The patient appears stable so that the remainder of the MSE may be completed by another provider.   Subjective:  The history is provided by the patient. No language interpreter was used.  Timothy Alvarez is a 60 y.o. male  with a history of renal insufficiency, CAD, HLD, DM, and MI, who presents to the Emergency Department complaining of intermittent SOB x 2 days. He reports increased SOB with exertion. Pt denies CP but reports mild epigastric pain that has improved at this time.   Pt was sent to the ED for further evaluation due to elevated d-dimer. Pt notes swelling in his BLE but states he has a fracture in the RLE. Pt has a h/o MI x 7 since 2010. He notes he did not experience CP with is past MI only nausea, vomitng and elevated blood sugars.    Cardiologist- [place]   Objective:  Constitutional: Pt is oriented to person, place, and time. Pt appears well-developed and well-nourished. No distress.  HENT:  Head: Normocephalic and atraumatic.  Eyes: Conjunctivae are normal.  Neck: Normal range of motion. Neck supple. JVD present. JVD up to mid neck  Cardiovascular: Normal rate. Intact BUE pulses   Pulmonary/Chest: Effort normal.  Abdominal: Pt exhibits no distension.  Musculoskeletal: Normal range of motion. He exhibits edema.  3+ BLE edema up to the knees  Neurological: Pt is alert and oriented to person, place, and time.  Skin: Skin is warm and dry.  Psychiatric: Pt has a normal mood and affect.  Nursing note and vitals reviewed.    Assessment and Plan:  Concern for cardiac ischemia. Placed initial orders and will have pt assessed in higher acuity department.   I discussed the case with Dr. Royann Shiversroitoru, based on the history and the EKG did not recommend calling an acute STEMI at this time.  By signing my name below, I, Naoma Dieneriana O Omoyeni, attest that this documentation has been prepared  under the direction and in the presence of Melene Planan, Louvinia Cumbo, DO.  Electronically Signed: Naoma Dieneriana O Omoyeni, Scribe. 02/10/2016. 7:23 PM.   I personally performed the services described in this documentation, which was scribed in my presence. The recorded information has been reviewed and is accurate.       Melene Planan Crew Goren, DO 02/10/16 1944

## 2016-02-10 NOTE — Telephone Encounter (Signed)
Pt states can't get test done until later. So resheduled the ct and US studies at 4 pm. If he get any worse symptoms  Particularly dyspnea/sob then ED evaluation.

## 2016-02-10 NOTE — Progress Notes (Signed)
Pre visit review using our clinic review tool, if applicable. No additional management support is needed unless otherwise documented below in the visit note. 

## 2016-02-10 NOTE — ED Notes (Signed)
Pt denies pain at this time.  Pt states in last week he has experienced midsternal burning, worse when BS is normal.  Pt also c/o increased shob. Pt in NAD at this time, pt has had MI x 7. Last cath 2013 d/t renal failure caused by contrast, pt had to be placed on dialysis.

## 2016-02-10 NOTE — ED Notes (Signed)
Pt returned from xray, pt denies pain. Repeat EKG done.

## 2016-02-10 NOTE — Patient Instructions (Addendum)
For your abdomen pain continue the ranitidine. GI cocktail added today in office to give some relief. Wanted to add ppi but interaction with plavix.  Will get stat labs for your abdomen pain. If labs negative and pain persist then refer you to GI. If pain severe and after hours then ED evaluation.(note cough associated with belch could be reflux)   For cough and dyspnea will get bnp and cxr. Will get d-dimer if elevated then consider lower ext dopplers and ct of the chest. Troponin as well based on cardiac history.  For pain in foot can use your hydrocodone which you have.   Follow up on Friday early am or as needed    I talked with pt wife later today since pt told CT tech he had severe allergy to contrast dye in past with kidney failure. Pt told CT tech he did not want the ct contrast study done. I talked with wife and I clarified with her his degree of shortness of breath. She states even with short walks/couple of steps would get very short of breath. Since his d-dimer was elevated and he refused ct contrast study I advised wife to take him to [facility] main ED. Show them avs and mention about his dyspnea and elevated d-dimer. VQ scan could potentially be done but not at our med -center. Wife agreed to take him to ED. I gave my cell number so she could call if at ED/triage any delay in getting him back.   Below is not [facility] hours late in day after reviewing all my labs. Ashlee, I was busy all day yesterday and you notified me of the d-dimer results. I immediately set up pt to have ct chest and US of lower extremity right after lunch(to evaluate for PE). Worked straight through lunch. I did not check his labs all day due to seeing pts back to back. Pt went for downstairs for CT but he did not want to contrast study due to reaction to contrast study in the past. So I sent him to ED to possibly get vq scan. I was unaware his troponin was elevated until lat on wed night after hours. Busy  all day and did not see troponin was resulted. Did lab tell you his troponin was elevated., All I heard was his d-dimer was elevated. I need to know if lab advised/informed you on the troponin. If they did not we need to advise them about this. Pt did go to ED as I advised. Please let me know about this. Super important.

## 2016-02-10 NOTE — ED Notes (Signed)
Pt to xray via stretcher

## 2016-02-10 NOTE — Telephone Encounter (Signed)
Catina from lab called with stat labs on patient. No specific labs were discussed.  States she would fax over labs to the office.  Esperanza RichtersEdward Saguier, PA-C made aware stat labs were called and results would be faxed.

## 2016-02-10 NOTE — ED Notes (Signed)
Cardiology at bedside.

## 2016-02-10 NOTE — H&P (Addendum)
Patient ID: Timothy Alvarez MRN: [id number], DOB/AGE: September 01, 1955   Admit date: 02/10/2016   Primary Physician: Mackie Pai, PA-C Primary Cardiologist: Kirk Ruths  Pt. Profile: 60 yo male w/ diffuse severe CAD (multiple PCIs), type I diabetic, CKD and HLD who presents with SOB and some epigastric discomfort.  Reports over the last several weeks of worsening SOB and DOE, even after 10 feet. His SOB worsened last weekend and thus went to his PCP. D-dimer was ordered, came back positive and directed to ED for VQ scan (eval for PE).   He reports some worsened swelling in lower legs.  No pleuritic pain, some dry cough. No recent immobility.   PMH: 1. CAD: 1st MI in 2010, had other events subsequently.  Care was in [place] until moved to [place] in 2014.  PCI (9/14) at [facility] with DES to [facility].  He also had 90% mLAD, but LAD was diffusely diseased and small caliber => not good target for PCI or CABG.  Echo (10/14) with EF 60%, grade II diastolic dysfunction.   2. Contrast-induced nephropathy: Post-PCI in 9/14.  3. Orthostatic hypotension: ?Due to diabetic peripheral neuropathy.  4. Diabetes 5. OSA: CPAP  Problem List  Past Medical History:  Diagnosis Date  . CAD (coronary artery disease)   . Cataracts, bilateral   . Chicken pox   . Contrast dye induced nephropathy   . Diabetes type 1, uncontrolled ([place])   . Diabetic retinopathy ([place])   . Frequent headaches   . Heart attack 09.13.2014   x5  . Hyperlipidemia   . Measles, [place] (rubella)   . Mumps   . Orthostatic hypotension   . OSA (obstructive sleep apnea) 07/17/2013  . Scarlet fever     Past Surgical History:  Procedure Laterality Date  . APPENDECTOMY  1966  . CARDIAC CATHETERIZATION N/A 03/18/2015   Procedure: Left Heart Cath and Coronary Angiography;  Surgeon: Belva Crome, MD;  Location: [place] CV LAB;  Service: Cardiovascular;  Laterality: N/A;  . CORONARY ANGIOPLASTY WITH STENT PLACEMENT    . REFRACTIVE  SURGERY     Retinopathy  . WISDOM TOOTH EXTRACTION       Allergies  Allergies  Allergen Reactions  . Contrast Media [Iodinated Diagnostic Agents] Other (See Comments)    Renal Failure.     Home Medications  Prior to Admission medications   Medication Sig Start Date End Date Taking? Authorizing Provider  acetaminophen (TYLENOL) 325 MG tablet Take 650 mg by mouth every 6 (six) hours as needed for mild pain.    Historical Provider, MD  aspirin 81 MG tablet Take 81 mg by mouth daily.    Historical Provider, MD  atorvastatin (LIPITOR) 40 MG tablet Take 20 mg by mouth daily. 1/2 tablet daily     Historical Provider, MD  clopidogrel (PLAVIX) 75 MG tablet Take 1 tablet (75 mg total) by mouth daily. 03/27/15   Erlene Quan, PA-C  co-enzyme Q-10 30 MG capsule Take 30 mg by mouth daily.     Historical Provider, MD  doxycycline (VIBRAMYCIN) 100 MG capsule Take 1 capsule (100 mg total) by mouth 2 (two) times daily. 10/05/15   Brunetta Jeans, PA-C  fludrocortisone (FLORINEF) 0.1 MG tablet TAKE 1 TABLET BY MOUTH TWICE DAILY 01/28/16   Brunetta Jeans, PA-C  furosemide (LASIX) 20 MG tablet Take 1 tablet (20 mg total) by mouth 2 (two) times daily. 01/20/16   Lelon Perla, MD  gabapentin (NEURONTIN) 600 MG tablet  TAKE 1 TABLET BY MOUTH 3 TIMES DAILY. 11/13/15   Brunetta Jeans, PA-C  glucagon 1 MG injection Inject 1 mg into the vein once as needed (Emergency Test Kit). Reported on 04/07/2015    Historical Provider, MD  glucose blood test strip 1 each by Other route as directed. Use as instructed FREESTYLE IN VITRO    Historical Provider, MD  HYDROcodone-acetaminophen (NORCO/VICODIN) 5-325 MG tablet Take 1 tablet by mouth every 6 (six) hours as needed for severe pain. 04/22/15   Brunetta Jeans, PA-C  insulin lispro (HUMALOG) 100 UNIT/ML injection Inject 1-40 Units into the skin as directed. Use in Insulin Pump, Max daily dose: 100 units/day    Historical Provider, MD  isosorbide mononitrate (IMDUR) 60  MG 24 hr tablet Take 1 tablet (60 mg total) by mouth daily. 03/21/15   Bhavinkumar Bhagat, PA  lisinopril (PRINIVIL,ZESTRIL) 10 MG tablet Take 10 mg by mouth daily. 03/13/15   Historical Provider, MD  meclizine (ANTIVERT) 12.5 MG tablet Take 1 tablet (12.5 mg total) by mouth 3 (three) times daily as needed for dizziness. 02/27/14   Percell Miller Saguier, PA-C  midodrine (PROAMATINE) 10 MG tablet TAKE 1 TABLET BY MOUTH TWICE DAILY 01/28/16   Brunetta Jeans, PA-C  nitroGLYCERIN (NITROSTAT) 0.4 MG SL tablet Place 1 tablet (0.4 mg total) under the tongue every 5 (five) minutes x 3 doses as needed for chest pain. 03/20/15   Leanor Kail, PA  NONFORMULARY OR COMPOUNDED [place] compound:  Onychomycosis Nail lacquer - Fluconazole 2 %, Terbinafine 1%, DMSO, apply to affected area daily. 09/25/15   Trula Slade, DPM  ranitidine (ZANTAC) 150 MG tablet TAKE 1 TABLET BY MOUTH TWICE DAILY 12/09/15   Brunetta Jeans, PA-C  SENEXON-S 8.6-50 MG tablet TAKE 1 TABLET BY MOUTH DAILY. 02/13/15   Brunetta Jeans, PA-C  Vitamin D, Ergocalciferol, (DRISDOL) 50000 units CAPS capsule Take 1 capsule (50,000 Units total) by mouth every 7 (seven) days. 01/13/16   Edward Saguier, PA-C  zolpidem (AMBIEN CR) 12.5 MG CR tablet TAKE 1 TABLET BY MOUTH AT BEDTIME 11/19/15   Darreld Mclean, MD    Family History  Family History  Problem Relation Age of Onset  . Hypertension Mother 63    Deceased  . Other Mother     s/p PPM  . Vascular Disease Father 53    Deceased  . Transient ischemic attack Father   . Liver cancer Paternal Grandfather   . Breast cancer Sister   . Heart disease Sister     Ablation for rhythm disturbance  . Healthy Brother     x1    Social History  Social History   Social History  . Marital status: Married    Spouse name: N/A  . Number of children: 1  . Years of education: N/A   Occupational History  . Not on file.   Social History Main Topics  . Smoking status: Never Smoker    . Smokeless tobacco: Never Used  . Alcohol use Yes     Comment: Rare  . Drug use: No  . Sexual activity: Not on file   Other Topics Concern  . 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  They have one grown daughter.   Highest level of education:  Master's Degree     Review of Systems General:  No chills, fever, night sweats or weight changes.  Cardiovascular:  No chest pain, dyspnea on exertion, edema, orthopnea, palpitations, paroxysmal nocturnal dyspnea. Dermatological: No rash, lesions/masses Respiratory: No cough, dyspnea Urologic: No hematuria, dysuria Abdominal:   No nausea, vomiting, diarrhea, bright red blood per rectum, melena, or hematemesis Neurologic:  No visual changes, wkns, changes in mental status. All other systems reviewed and are otherwise negative except as noted above.  Physical Exam  Blood pressure 150/84, pulse 80, temperature 98.3 F (36.8 C), temperature source Oral, resp. rate 19, height 5\' 6"  (1.676 m), weight 85.3 kg (188 lb), SpO2 99 %.  General: Pleasant, NAD Psych: Normal affect. Neuro: Alert and oriented X 3. Moves all extremities spontaneously. HEENT: Normal  Neck: Supple without bruits, +JVD. Lungs:  Resp regular and unlabored, CTA. Heart: RRR no s3, s4, or murmurs. Abdomen: Soft, non-tender, non-distended, BS + x 4.  Extremities: 1+ edema up to shins  Labs  Troponin (Point of Care Test)  Recent Labs  02/10/16 2014  TROPIPOC 2.28*   No results for input(s): CKTOTAL, CKMB, TROPONINI in the last 72 hours. Lab Results  Component Value Date   WBC 5.7 02/10/2016   HGB 11.4 (L) 02/10/2016   HCT 34.7 (L) 02/10/2016   MCV 85.5 02/10/2016   PLT 231 02/10/2016    Recent Labs Lab 02/10/16 1030 02/10/16 1952  NA 140 138  K 4.9 3.8  CL 101 101  CO2 34* 28  BUN 18 15  CREATININE 1.31 1.45*  CALCIUM 9.2 8.7*  PROT 6.9  --   BILITOT 0.6  --   ALKPHOS 164*  --    ALT 60*  --   AST 50*  --   GLUCOSE 258* 154*   Lab Results  Component Value Date   CHOL 123 (L) 10/21/2015   HDL 64 10/21/2015   LDLCALC 49 10/21/2015   TRIG 51 10/21/2015   Lab Results  Component Value Date   DDIMER 2.40 (H) 02/10/2016     Radiology/Studies  Dg Chest 2 View  Result Date: 02/10/2016 CLINICAL DATA:  Dry, nonproductive cough for 2 weeks. Shortness of breath. EXAM: CHEST  2 VIEW COMPARISON:  03/17/2015 FINDINGS: Heart and mediastinal contours are within normal limits. No focal opacities or effusions. No acute bony abnormality. IMPRESSION: No active cardiopulmonary disease. Electronically Signed   By: 05/15/2015 M.D.   On: 02/10/2016 11:19   02/12/2016 Venous Img Lower Bilateral  Result Date: 02/10/2016 CLINICAL DATA:  Bilateral lower extremity edema EXAM: BILATERAL LOWER EXTREMITY VENOUS DUPLEX ULTRASOUND TECHNIQUE: Gray-scale sonography with graded compression, as well as color Doppler and duplex ultrasound were performed to evaluate the lower extremity deep venous systems from the level of the common femoral vein and including the common femoral, femoral, profunda femoral, popliteal and calf veins including the posterior tibial, peroneal and gastrocnemius veins when visible. The superficial great saphenous vein was also interrogated. Spectral Doppler was utilized to evaluate flow at rest and with distal augmentation maneuvers in the common femoral, femoral and popliteal veins. COMPARISON:  None. FINDINGS: RIGHT LOWER EXTREMITY Common Femoral Vein: No evidence of thrombus. Normal compressibility, respiratory phasicity and response to augmentation. Saphenofemoral Junction: No evidence of thrombus. Normal compressibility and flow on color Doppler imaging. Profunda Femoral Vein: No evidence of thrombus. Normal compressibility and flow on color Doppler imaging. Femoral Vein: No evidence of thrombus. Normal compressibility, respiratory phasicity and response to augmentation.  Popliteal Vein: No evidence of thrombus. Normal compressibility, respiratory phasicity and response to augmentation. Calf Veins: No evidence of thrombus. Normal compressibility and flow  on color Doppler imaging. Superficial Great Saphenous Vein: No evidence of thrombus. Normal compressibility and flow on color Doppler imaging. Venous Reflux:  None. Other Findings:  None. LEFT LOWER EXTREMITY Common Femoral Vein: No evidence of thrombus. Normal compressibility, respiratory phasicity and response to augmentation. Saphenofemoral Junction: No evidence of thrombus. Normal compressibility and flow on color Doppler imaging. Profunda Femoral Vein: No evidence of thrombus. Normal compressibility and flow on color Doppler imaging. Femoral Vein: No evidence of thrombus. Normal compressibility, respiratory phasicity and response to augmentation. Popliteal Vein: No evidence of thrombus. Normal compressibility, respiratory phasicity and response to augmentation. Calf Veins: No evidence of thrombus. Normal compressibility and flow on color Doppler imaging. Superficial Great Saphenous Vein: No evidence of thrombus. Normal compressibility and flow on color Doppler imaging. Venous Reflux:  None. Other Findings:  None. IMPRESSION: No evidence of deep venous thrombosis in either lower extremity. Electronically Signed   By: Lowella Grip III M.D.   On: 02/10/2016 17:08   Dg Foot Complete Right  Result Date: 02/09/2016 CLINICAL DATA:  Multiple recent injuries.  Pain in swelling. EXAM: RIGHT FOOT COMPLETE - 3+ VIEW COMPARISON:  Multiple prior foot films dating back to 12/01/2015 FINDINGS: Findings consistent with a Lisfranc fracture dislocation involving the right foot. There is mild lateral subluxation of the second metatarsal base and moderate medial subluxation of the first metatarsal. There appear to be small fractures at the metatarsal bases along with a chronic fracture the base of the fifth metatarsal. There may be a  Charcot component given the small vessel calcifications. CT suggested for further evaluation. 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 CT suggested for further evaluation. Electronically Signed   By: Marijo Sanes M.D.   On: 02/09/2016 13:17   Dg Foot Complete Right  Result Date: 01/12/2016 CLINICAL DATA:  Fifth metatarsal fracture.  Great toe pain. EXAM: RIGHT FOOT COMPLETE - 3+ VIEW COMPARISON:  12/22/2015 FINDINGS: Comminuted fracture the base of the fifth toe is probably an avulsion fracture rather than a Jones fracture. The comminution is an unusual feature. There appears to be a transverse fracture of the proximal metaphysis of the second digit metatarsal. This raises concern for Lisfranc joint instability although there is no current malalignment. Abnormal vascular calcifications. Erosions along the interphalangeal joint of the great toe. Degenerative arthropathy at the fifth MTP joint. Pes planus. Small plantar and Achilles calcaneal spurs. Dorsal subcutaneous edema in the foot. IMPRESSION: 1. New nondisplaced transverse fracture the proximal metaphysis of the second metatarsal. This raises concern for Lisfranc joint instability although there is no current malalignment. 2. Comminuted fracture the base of the fifth metatarsal. In general this appears to extend into the proximal articulation and accordingly is probably an avulsion fracture instead of a Jones fracture, the comminution being an unusual feature. 3. Degenerative spurring in the interphalangeal joints and the MTP joints. Possible erosions along the interphalangeal joint of the great toe. 4. Pes planus. Electronically Signed   By: Van Clines M.D.   On: 01/12/2016 12:56    ECG NSR, LAFB  Procedures    Left Heart Cath and Coronary Angiography  03/18/2015    Conclusion    1. Mid RCA lesion, 30% stenosed. The lesion was  previously treated with a stent (unknown type). 2. Mid RCA to Dist RCA lesion, 50% stenosed. 3. 1st RPLB lesion, 90% stenosed. 4. RPDA-2 lesion, 60% stenosed. 5. RPDA-1 lesion, 65% stenosed. 6. Ost RCA to Prox RCA  lesion, 50% stenosed. 7. Ost 1st Mrg to 1st Mrg lesion, 95% stenosed. 8. 1st Mrg lesion, 75% stenosed. 9. 1st Diag lesion, 95% stenosed. 10. Mid LAD lesion, 99% stenosed. 11. Dist LAD lesion, 90% stenosed. 48. Ost LAD to Prox LAD lesion, 75% stenosed.   Severe diffuse LAD, and circumflex coronary artery disease. Diffuse disease in the distal RCA.  Patent mid right coronary stent.  Left ventriculography was not performed.LVEDP is 24 mmHg   Recommendations:   Continue aggressive medical therapy. If the patient has never formally had a surgical consultation for CABG, it would be appropriate to get a surgical opinion at this time. The diffuse nature of the patient's coronary disease makes PCI high risk and unlikely to be of benefit. This could also make the patient non-surgical (inadequate targets for grafting).   Echo 03/20/2015 LV EF: 50% -  55%  ------------------------------------------------------------------- Indications:   CAD of native vessels 414.01.  ------------------------------------------------------------------- History:  PMH:  Dyspnea. PMH:  Myocardial infarction. Risk factors: Diabetes mellitus.  ------------------------------------------------------------------- Study Conclusions  - Left ventricle: distal septal and apical hypokinesis. The cavity size was mildly dilated. Wall thickness was normal. Systolic function was normal. The estimated ejection fraction was in the range of 50% to 55%. Doppler parameters are consistent with elevated ventricular end-diastolic filling pressure. - Mitral valve: Severe posterior annular calcification. - Atrial septum: No defect or patent foramen ovale was  identified.  ================================= ASSESSMENT AND PLAN 60 yo male w/ diffuse severe CAD (multiple PCIs), type I diabetic, CKD and HLD who presents with SOB and some epigastric discomfort.  Acute CHF/Dyspnea: appears mildly volume overloaded.  - lasix 68m IV x 1 - repeat BMP, and re-assess diuretic response  Troponin elevation: likely type Ii, from demand but [facility] but has known disease - will give heparin gtt pending repeat troponin  D-dimer elevation: likely from acute CHF. Less likely PE - may consider VQ in AM if tachypneic  CAD:  - plavix, asa, imdur, statin  - not on BB likely b/c orthostatic hypotension  Orthostatic hypotenion - continue home meds (midodrine, forinef)  CKD: stable  DM: insulin pump  Signed JCharlies Silvers MD

## 2016-02-10 NOTE — ED Triage Notes (Signed)
Pt reports upper abd pains and recent sob. Has swelling to his legs. Was sent here by pcp for elevated d dimer.

## 2016-02-11 ENCOUNTER — Observation Stay (HOSPITAL_COMMUNITY): Payer: Medicare Other

## 2016-02-11 DIAGNOSIS — Z8249 Family history of ischemic heart disease and other diseases of the circulatory system: Secondary | ICD-10-CM | POA: Diagnosis not present

## 2016-02-11 DIAGNOSIS — G4733 Obstructive sleep apnea (adult) (pediatric): Secondary | ICD-10-CM | POA: Diagnosis present

## 2016-02-11 DIAGNOSIS — I10 Essential (primary) hypertension: Secondary | ICD-10-CM | POA: Diagnosis not present

## 2016-02-11 DIAGNOSIS — T508X5A Adverse effect of diagnostic agents, initial encounter: Secondary | ICD-10-CM | POA: Diagnosis present

## 2016-02-11 DIAGNOSIS — E1022 Type 1 diabetes mellitus with diabetic chronic kidney disease: Secondary | ICD-10-CM | POA: Diagnosis present

## 2016-02-11 DIAGNOSIS — I214 Non-ST elevation (NSTEMI) myocardial infarction: Principal | ICD-10-CM

## 2016-02-11 DIAGNOSIS — B351 Tinea unguium: Secondary | ICD-10-CM | POA: Diagnosis present

## 2016-02-11 DIAGNOSIS — Z794 Long term (current) use of insulin: Secondary | ICD-10-CM | POA: Diagnosis not present

## 2016-02-11 DIAGNOSIS — Z7902 Long term (current) use of antithrombotics/antiplatelets: Secondary | ICD-10-CM | POA: Diagnosis not present

## 2016-02-11 DIAGNOSIS — Z91041 Radiographic dye allergy status: Secondary | ICD-10-CM | POA: Diagnosis not present

## 2016-02-11 DIAGNOSIS — E785 Hyperlipidemia, unspecified: Secondary | ICD-10-CM | POA: Diagnosis present

## 2016-02-11 DIAGNOSIS — N183 Chronic kidney disease, stage 3 (moderate): Secondary | ICD-10-CM | POA: Diagnosis present

## 2016-02-11 DIAGNOSIS — I130 Hypertensive heart and chronic kidney disease with heart failure and stage 1 through stage 4 chronic kidney disease, or unspecified chronic kidney disease: Secondary | ICD-10-CM | POA: Diagnosis present

## 2016-02-11 DIAGNOSIS — R791 Abnormal coagulation profile: Secondary | ICD-10-CM | POA: Diagnosis present

## 2016-02-11 DIAGNOSIS — I252 Old myocardial infarction: Secondary | ICD-10-CM | POA: Diagnosis not present

## 2016-02-11 DIAGNOSIS — E1121 Type 2 diabetes mellitus with diabetic nephropathy: Secondary | ICD-10-CM | POA: Diagnosis not present

## 2016-02-11 DIAGNOSIS — Z79899 Other long term (current) drug therapy: Secondary | ICD-10-CM | POA: Diagnosis not present

## 2016-02-11 DIAGNOSIS — Z955 Presence of coronary angioplasty implant and graft: Secondary | ICD-10-CM | POA: Diagnosis not present

## 2016-02-11 DIAGNOSIS — E1042 Type 1 diabetes mellitus with diabetic polyneuropathy: Secondary | ICD-10-CM | POA: Diagnosis present

## 2016-02-11 DIAGNOSIS — E10319 Type 1 diabetes mellitus with unspecified diabetic retinopathy without macular edema: Secondary | ICD-10-CM | POA: Diagnosis present

## 2016-02-11 DIAGNOSIS — Z7982 Long term (current) use of aspirin: Secondary | ICD-10-CM | POA: Diagnosis not present

## 2016-02-11 DIAGNOSIS — E1065 Type 1 diabetes mellitus with hyperglycemia: Secondary | ICD-10-CM | POA: Diagnosis present

## 2016-02-11 DIAGNOSIS — N141 Nephropathy induced by other drugs, medicaments and biological substances: Secondary | ICD-10-CM | POA: Diagnosis present

## 2016-02-11 DIAGNOSIS — I5043 Acute on chronic combined systolic (congestive) and diastolic (congestive) heart failure: Secondary | ICD-10-CM | POA: Diagnosis present

## 2016-02-11 DIAGNOSIS — I951 Orthostatic hypotension: Secondary | ICD-10-CM | POA: Diagnosis not present

## 2016-02-11 DIAGNOSIS — R0602 Shortness of breath: Secondary | ICD-10-CM | POA: Diagnosis present

## 2016-02-11 DIAGNOSIS — I2510 Atherosclerotic heart disease of native coronary artery without angina pectoris: Secondary | ICD-10-CM | POA: Diagnosis present

## 2016-02-11 LAB — TROPONIN I
TROPONIN I: 1.99 ng/mL — AB (ref <0.03)
TROPONIN I: 1.01 ng/mL — AB (ref <0.03)
Troponin I: 1.45 ng/mL (ref <0.03)

## 2016-02-11 LAB — CBC
HCT: 35.1 % — ABNORMAL LOW (ref 39.0–52.0)
Hemoglobin: 11.7 g/dL — ABNORMAL LOW (ref 13.0–17.0)
MCH: 28.5 pg (ref 26.0–34.0)
MCHC: 33.3 g/dL (ref 30.0–36.0)
MCV: 85.4 fL (ref 78.0–100.0)
Platelets: 229 K/uL (ref 150–400)
RBC: 4.11 MIL/uL — ABNORMAL LOW (ref 4.22–5.81)
RDW: 14.1 % (ref 11.5–15.5)
WBC: 5.8 K/uL (ref 4.0–10.5)

## 2016-02-11 LAB — MRSA PCR SCREENING: MRSA BY PCR: NEGATIVE

## 2016-02-11 LAB — GLUCOSE, CAPILLARY
GLUCOSE-CAPILLARY: 129 mg/dL — AB (ref 65–99)
GLUCOSE-CAPILLARY: 190 mg/dL — AB (ref 65–99)
Glucose-Capillary: 159 mg/dL — ABNORMAL HIGH (ref 65–99)
Glucose-Capillary: 172 mg/dL — ABNORMAL HIGH (ref 65–99)
Glucose-Capillary: 109 mg/dL — ABNORMAL HIGH (ref 65–99)

## 2016-02-11 LAB — HEPARIN LEVEL (UNFRACTIONATED)
Heparin Unfractionated: 0.32 IU/mL (ref 0.30–0.70)
Heparin Unfractionated: 0.31 IU/mL (ref 0.30–0.70)

## 2016-02-11 LAB — BASIC METABOLIC PANEL
ANION GAP: 9 (ref 5–15)
BUN: 14 mg/dL (ref 6–20)
CO2: 28 mmol/L (ref 22–32)
Calcium: 8.5 mg/dL — ABNORMAL LOW (ref 8.9–10.3)
Chloride: 104 mmol/L (ref 101–111)
Creatinine, Ser: 1.39 mg/dL — ABNORMAL HIGH (ref 0.61–1.24)
GFR calc Af Amer: >60 mL/min (ref >60)
GFR, EST NON AFRICAN AMERICAN: 53 mL/min — AB (ref >60)
GLUCOSE: 176 mg/dL — AB (ref 65–99)
POTASSIUM: 3.7 mmol/L (ref 3.5–5.1)
Sodium: 141 mmol/L (ref 135–145)

## 2016-02-11 LAB — H. PYLORI BREATH TEST: H. pylori Breath Test: NOT DETECTED

## 2016-02-11 MED ORDER — SENNOSIDES-DOCUSATE SODIUM 8.6-50 MG PO TABS
1.0000 | ORAL_TABLET | Freq: Every day | ORAL | Status: DC
  Administered 2016-02-11 – 2016-02-13 (×3): 1 via ORAL
  Filled 2016-02-11 (×3): qty 1

## 2016-02-11 MED ORDER — COENZYME Q10 30 MG PO CAPS: 30.0000 mg | ORAL_CAPSULE | Freq: Every day | ORAL | Status: DC

## 2016-02-11 MED ORDER — MECLIZINE HCL 12.5 MG PO TABS: 12.5000 mg | ORAL_TABLET | Freq: Three times a day (TID) | ORAL | Status: DC | PRN

## 2016-02-11 MED ORDER — MIDODRINE HCL 5 MG PO TABS
10.0000 mg | ORAL_TABLET | Freq: Two times a day (BID) | ORAL | Status: DC
  Administered 2016-02-11 – 2016-02-13 (×5): 10 mg via ORAL
  Filled 2016-02-11 (×5): qty 2

## 2016-02-11 MED ORDER — ACETAMINOPHEN 325 MG PO TABS: 650.0000 mg | ORAL_TABLET | Freq: Four times a day (QID) | ORAL | Status: DC | PRN

## 2016-02-11 MED ORDER — FUROSEMIDE 40 MG PO TABS
40.0000 mg | ORAL_TABLET | Freq: Every day | ORAL | Status: DC
  Administered 2016-02-11 – 2016-02-13 (×3): 40 mg via ORAL
  Filled 2016-02-11 (×3): qty 1

## 2016-02-11 MED ORDER — HYDROCODONE-ACETAMINOPHEN 5-325 MG PO TABS: 1.0000 | ORAL_TABLET | Freq: Four times a day (QID) | ORAL | Status: DC | PRN

## 2016-02-11 MED ORDER — LISINOPRIL 10 MG PO TABS
10.0000 mg | ORAL_TABLET | Freq: Every day | ORAL | Status: DC
  Administered 2016-02-11 – 2016-02-13 (×3): 10 mg via ORAL
  Filled 2016-02-11 (×3): qty 1

## 2016-02-11 MED ORDER — CLOPIDOGREL BISULFATE 75 MG PO TABS
75.0000 mg | ORAL_TABLET | Freq: Every day | ORAL | Status: DC
  Administered 2016-02-11 – 2016-02-13 (×3): 75 mg via ORAL
  Filled 2016-02-11 (×3): qty 1

## 2016-02-11 MED ORDER — ROSUVASTATIN CALCIUM 20 MG PO TABS
40.0000 mg | ORAL_TABLET | Freq: Every day | ORAL | Status: DC
  Administered 2016-02-11 – 2016-02-13 (×3): 40 mg via ORAL
  Filled 2016-02-11 (×4): qty 2

## 2016-02-11 MED ORDER — FAMOTIDINE 20 MG PO TABS
10.0000 mg | ORAL_TABLET | Freq: Every day | ORAL | Status: DC
  Administered 2016-02-11 – 2016-02-13 (×3): 10 mg via ORAL
  Filled 2016-02-11 (×3): qty 1

## 2016-02-11 MED ORDER — ISOSORBIDE MONONITRATE ER 60 MG PO TB24
60.0000 mg | ORAL_TABLET | Freq: Every day | ORAL | Status: DC
  Administered 2016-02-11 – 2016-02-13 (×3): 60 mg via ORAL
  Filled 2016-02-11 (×3): qty 1

## 2016-02-11 MED ORDER — HEART ATTACK BOUNCING BOOK
Freq: Once | Status: AC
  Administered 2016-02-11: 21:00:00
  Filled 2016-02-11: qty 1

## 2016-02-11 MED ORDER — TECHNETIUM TO 99M ALBUMIN AGGREGATED
4.0000 | Freq: Once | INTRAVENOUS | Status: AC | PRN
  Administered 2016-02-11: 4 via INTRAVENOUS

## 2016-02-11 MED ORDER — TECHNETIUM TC 99M DIETHYLENETRIAME-PENTAACETIC ACID: 30.0000 | Freq: Once | INTRAVENOUS | Status: DC | PRN

## 2016-02-11 MED ORDER — ASPIRIN EC 81 MG PO TBEC
81.0000 mg | DELAYED_RELEASE_TABLET | Freq: Every day | ORAL | Status: DC
  Administered 2016-02-11 – 2016-02-13 (×3): 81 mg via ORAL
  Filled 2016-02-11 (×3): qty 1

## 2016-02-11 MED ORDER — INSULIN PUMP
Freq: Three times a day (TID) | SUBCUTANEOUS | Status: DC
  Administered 2016-02-11: 8.5 via SUBCUTANEOUS
  Administered 2016-02-12: 12.15 via SUBCUTANEOUS
  Administered 2016-02-12: 7.05 via SUBCUTANEOUS
  Administered 2016-02-12: 08:00:00 5.45 via SUBCUTANEOUS
  Filled 2016-02-11: qty 1

## 2016-02-11 MED ORDER — INSULIN ASPART 100 UNIT/ML ~~LOC~~ SOLN: 0.0000 [IU] | Freq: Three times a day (TID) | SUBCUTANEOUS | Status: DC

## 2016-02-11 MED ORDER — INSULIN ASPART 100 UNIT/ML ~~LOC~~ SOLN: 0.0000 [IU] | Freq: Every day | SUBCUTANEOUS | Status: DC

## 2016-02-11 MED ORDER — GABAPENTIN 600 MG PO TABS
600.0000 mg | ORAL_TABLET | Freq: Three times a day (TID) | ORAL | Status: DC
  Administered 2016-02-11 – 2016-02-13 (×8): 600 mg via ORAL
  Filled 2016-02-11 (×8): qty 1

## 2016-02-11 MED ORDER — FUROSEMIDE 10 MG/ML IJ SOLN
40.0000 mg | Freq: Once | INTRAMUSCULAR | Status: AC
  Administered 2016-02-11: 40 mg via INTRAVENOUS
  Filled 2016-02-11: qty 4

## 2016-02-11 MED ORDER — FLUDROCORTISONE ACETATE 0.1 MG PO TABS
100.0000 ug | ORAL_TABLET | Freq: Two times a day (BID) | ORAL | Status: DC
  Administered 2016-02-11 – 2016-02-13 (×5): 100 ug via ORAL
  Filled 2016-02-11 (×5): qty 1

## 2016-02-11 MED ORDER — ONDANSETRON HCL 4 MG/2ML IJ SOLN: 4.0000 mg | Freq: Four times a day (QID) | INTRAMUSCULAR | Status: DC | PRN

## 2016-02-11 MED ORDER — NITROGLYCERIN 0.4 MG SL SUBL
0.4000 mg | SUBLINGUAL_TABLET | SUBLINGUAL | Status: DC | PRN
  Administered 2016-02-12: 12:00:00 0.4 mg via SUBLINGUAL
  Filled 2016-02-11: qty 1

## 2016-02-11 MED ORDER — ZOLPIDEM TARTRATE 5 MG PO TABS
5.0000 mg | ORAL_TABLET | Freq: Every evening | ORAL | Status: DC | PRN
  Administered 2016-02-12: 23:00:00 5 mg via ORAL
  Filled 2016-02-11 (×2): qty 1

## 2016-02-11 NOTE — Progress Notes (Signed)
Set pt up on Cpap full face mask 8.5 CMH20.. Pt tolerating well.

## 2016-02-11 NOTE — Progress Notes (Signed)
Pt is type 1 Diabetic on insulin pump.  I have added consult to diabetic coordinator to help manage.

## 2016-02-11 NOTE — Progress Notes (Signed)
ANTICOAGULATION CONSULT NOTE - Follow-up Consult  Pharmacy Consult for heparin Indication: chest pain/ACS  Allergies  Allergen Reactions  . Contrast Media [Iodinated Diagnostic Agents] Other (See Comments)    Renal Failure.     Patient Measurements: Height: 5\' 6"  (167.6 cm) Weight: 189 lb 9.5 oz (86 kg) IBW/kg (Calculated) : 63.8 Heparin Dosing Weight: 81.4  Vital Signs: Temp: 97 F (36.1 C) (11/30 0749) Temp Source: Axillary (11/30 0749) BP: 147/80 (11/30 0749) Pulse Rate: 70 (11/30 0749)  Labs:  Recent Labs  02/10/16 1030 02/10/16 1952 02/11/16 0211 02/11/16 0650 02/11/16 0848  HGB 12.2* 11.4*  --  11.7*  --   HCT 36.3* 34.7*  --  35.1*  --   PLT 260.0 231  --  229  --   HEPARINUNFRC  --   --  0.32  --  0.31  CREATININE 1.31 1.45*  --  1.39*  --   TROPONINI  --   --  1.99* 1.45*  --     Estimated Creatinine Clearance: 56.7 mL/min (by C-G formula based on SCr of 1.39 mg/dL (H)).  Assessment: 60 y.o. male on heparin for ACS Heparin level therapeutic x 2 CBC stable  Goal of Therapy:  Heparin level 0.3-0.7 units/ml Monitor platelets by anticoagulation protocol: Yes   Plan:  Continue heparin infusion at 1000 units/hr Follow up for heparin dc tomorrow  Thank you Okey RegalLisa Cathleen Yagi, PharmD [phone]  02/11/2016,11:00 AM

## 2016-02-11 NOTE — Progress Notes (Signed)
PHARMACIST - PHYSICIAN ORDER COMMUNICATION  CONCERNING: P&T Medication Policy on Herbal Medications  DESCRIPTION:  This patient's order for:  Co Enzyme Q10  has been noted.  This product(s) is classified as an "herbal" or natural product. Due to a lack of definitive safety studies or FDA approval, nonstandard manufacturing practices, plus the potential risk of unknown drug-drug interactions while on inpatient medications, the Pharmacy and Therapeutics Committee does not permit the use of "herbal" or natural products of this type within [place].   ACTION TAKEN: The pharmacy department is unable to verify this order at this time and your patient has been informed of this safety policy. Please reevaluate patient's clinical condition at discharge and address if the herbal or natural product(s) should be resumed at that time.   

## 2016-02-11 NOTE — Progress Notes (Addendum)
Inpatient Diabetes Program Recommendations  AACE/ADA: New Consensus Statement on Inpatient Glycemic Control (2015)  Target Ranges:  Prepandial:   less than 140 mg/dL      Peak postprandial:   less than 180 mg/dL (1-2 hours)      Critically ill patients:  140 - 180 mg/dL   Lab Results  Component Value Date   GLUCAP 190 (H) 02/11/2016   HGBA1C 8.8 (H) 01/12/2016    Review of Glycemic Control  Diabetes history: DM 1 Outpatient Diabetes medications: Omni pod insulin pump with Humalog insulin Current orders for Inpatient glycemic control: Insulin Pump order set  Spoke with patient regarding diabetes and home regimen for diabetes management.  Patient states that he was diagnosed with diabetes at the age of 60 years old and he is followed by [name], PA with [facility] for diabetes management. Patient uses an Omni Pod insulin pump with Humalog insulin as an outpatient. Patient has insulin pump on his left upper arm with PDA device at bedside. Per patient's pump, he will have insulin until 5:36 pm tomorrow 12/1. At that time if patient still remains at hospital will need to consider SQ insulin therapy (Lantus 22-24 units Q24hours, Novolog Sensitive Correction, Novolog 4 units TID meal coverage). Patient will use SQ insulin until he returns home to replace his pump with new supplies.   Current insulin pump settings are as follows:  Basal insulin  12A-10A 1.1 units/hour 10A-5P .95 units/hour 5P-12A 1 unit/hour  Total daily basal insulin: 24.65 units/24 hours  Carb Coverage 1:9 1 unit for every 9 grams of carbohydrates  Insulin Sensitivity 1:30 1 unit drops blood glucose 30 mg/dl  In talking with the patient he states that his blood glucose normally runs very good.   NURSING: Once insulin pump order set is ordered please print off the Patient insulin pump contract and flow sheet. The insulin pump contract should be signed by the patient and then placed in the chart. The  patient insulin pump flow sheet will be completed by the patient at the bedside and the RN caring for the patient will use the patient's flow sheet to document in the [facility]. RN will need to complete the Nursing Insulin Pump Flowsheet at least once a shift.   Thanks,  Timothy DeemShannon Maytte Jacot RN, MSN, [facility] Inpatient Diabetes Coordinator Team Pager [phone] (8a-5p)

## 2016-02-11 NOTE — Care Management Note (Signed)
Case Management Note  Patient Details  Name: Timothy Alvarez MRN: [id number] Date of Birth: 01-15-1956  Subjective/Objective:  Presents thru ED with elevated trops, sob, on heparin drip, will treat as NSTEMI.  PTA indep, on plaivs.  NCM will cont to follow for dc needs.                   Action/Plan:   Expected Discharge Date:                  Expected Discharge Plan:  Home/Self Care  In-House Referral:     Discharge planning Services  CM Consult  Post Acute Care Choice:    Choice offered to:     DME Arranged:    DME Agency:     HH Arranged:    HH Agency:     Status of Service:  Completed, signed off  If discussed at [employer] Length of Stay Meetings, dates discussed:    Additional Comments:  Leone Havenaylor, Emmanuelle Hibbitts [place], RN 02/11/2016, 10:59 AM

## 2016-02-11 NOTE — Progress Notes (Signed)
ANTICOAGULATION CONSULT NOTE - Follow-up Consult  Pharmacy Consult for heparin Indication: chest pain/ACS  Allergies  Allergen Reactions  . Contrast Media [Iodinated Diagnostic Agents] Other (See Comments)    Renal Failure.     Patient Measurements: Height: 5\' 6"  (167.6 cm) Weight: 189 lb 9.5 oz (86 kg) IBW/kg (Calculated) : 63.8 Heparin Dosing Weight: 81.4  Vital Signs: Temp: 98.3 F (36.8 C) (11/30 0054) Temp Source: Oral (11/30 0054) BP: 163/82 (11/30 0200) Pulse Rate: 77 (11/30 0054)  Labs:  Recent Labs  02/10/16 1030 02/10/16 1952 02/11/16 0211  HGB 12.2* 11.4*  --   HCT 36.3* 34.7*  --   PLT 260.0 231  --   HEPARINUNFRC  --   --  0.32  CREATININE 1.31 1.45*  --   TROPONINI  --   --  1.99*    Estimated Creatinine Clearance: 54.3 mL/min (by C-G formula based on SCr of 1.45 mg/dL (H)).  Assessment: 60 y.o. male on heparin for r/o ACS. Trop 1.99. Heparin level therapeutic (0.32) on initial check. No bleeding noted.  Goal of Therapy:  Heparin level 0.3-0.7 units/ml Monitor platelets by anticoagulation protocol: Yes   Plan:  Continue heparin infusion at 1000 units/hr Will f/u 6hr confirmatory heparin level  Christoper Fabianaron Nachman Sundt, PharmD, BCPS Clinical pharmacist, pager [phone] 02/11/2016,3:22 AM

## 2016-02-11 NOTE — Progress Notes (Addendum)
Subjective: 60 year old male presents the office in follow-up evaluation of right foot fractures. He states he is having no pain in the foot however he has noticed some mild swelling to the foot. Denies any redness or increase in warmth. Other than stubbing his toe he denies any other injury to his foot. He does not wear the cam boot or surgical shoe and he presents today wearing a regular shoe. Denies any systemic complaints such as fevers, chills, nausea, vomiting. No acute changes since last appointment, and no other complaints at this time.   Objective: AAO x3, NAD; presents today wearing a flat regular shoe. DP/PT pulses palpable bilaterally, CRT less than 3 seconds Protective sensation decreased with Simms Weinstein monofilament The right foot does be somewhat wider than the left foot. There is mild edema to the right foot there is no erythema or increase in warmth. There is no area of tenderness however he does have significant neuropathy. There is no rocker bottom deformity or any other gross deformity to the right foot. On the left foot the wounds appear to be healed and there is no other open lesions or pre-ulcerative lesions identified bilaterally.  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 Discussed with him very concerned about Charcot in his right foot. -I recommended immobilization and nonweightbearing to the right foot however he states he cannot do this and he has to drive. Because of this discussed that he must go into the CAM boot and wear this at all times. I recommended nonweightbearing as well but he wishes to hold off on that. I had a long discussion with him in regards to the findings on x-ray and  this could lead to loss of leg and deformity to his foot. He understands this. -Follow-up in 2 weeks for repeat x-rays. We'll likely order a CT scan at that appointment as well.  Ovid CurdMatthew Zyiah Withington, DPM

## 2016-02-11 NOTE — Progress Notes (Signed)
Patient Name: Timothy Alvarez Date of Encounter: 02/11/2016  [facility]ologist[facility]  Hospital Problem List     Active Problems:   NSTEMI (non-ST elevated myocardial infarction) [facility](HCC)     Subjective   Denies CP or dyspnea  Inpatient Medications    Scheduled Meds: . aspirin EC  81 mg Oral Daily  . clopidogrel  75 mg Oral Daily  . famotidine  10 mg Oral Daily  . fludrocortisone  100 mcg Oral BID WC  . gabapentin  600 mg Oral TID  . isosorbide mononitrate  60 mg Oral Daily  . lisinopril  10 mg Oral Daily  . midodrine  10 mg Oral BID WC  . rosuvastatin  40 mg Oral Daily  . senna-docusate  1 tablet Oral Daily   Continuous Infusions: . heparin 1,000 Units/hr (02/11/16 0600)   PRN Meds: acetaminophen, HYDROcodone-acetaminophen, meclizine, nitroGLYCERIN, ondansetron (ZOFRAN) IV, zolpidem   Vital Signs    Vitals:   02/11/16 0238 02/11/16 0400 02/11/16 0500 02/11/16 0749  BP:  136/71 133/73 (!) 147/80  Pulse:    70  Resp: 18 15 16 14   Temp:   98.5 F (36.9 C) 97 F (36.1 C)  TempSrc:   Oral Axillary  SpO2: 98% 98% 98% 97%  Weight:      Height:        Intake/Output Summary (Last 24 hours) at 02/11/16 0807 Last data filed at 02/11/16 0600  Gross per 24 hour  Intake            86.17 ml  Output              700 ml  Net          -613.83 ml   Filed Weights   02/10/16 1859 02/11/16 0054  Weight: 188 lb (85.3 kg) 189 lb 9.5 oz (86 kg)    Physical Exam   GEN: Well nourished, well developed, in no acute distress.  HEENT: Grossly normal.  Neck: Supple Cardiac: RRR, trace peripheral edema Respiratory:  CTA GI: Soft, nontender, nondistended MS: no deformity or atrophy. Skin: warm and dry, no rash. Neuro:  Strength and sensation are intact. Psych: AAOx3.  Normal affect.  Labs    CBC  Recent Labs  02/10/16 1030 02/10/16 1952  WBC 6.6 5.7  NEUTROABS 4.7  --   HGB 12.2* 11.4*  HCT 36.3* 34.7*  MCV 84.3 85.5  PLT 260.0 231   Basic Metabolic  Panel  Recent Labs  09/81/1911/29/17 1952 02/11/16 0650  NA 138 141  K 3.8 3.7  CL 101 104  CO2 28 28  GLUCOSE 154* 176*  BUN 15 14  CREATININE 1.45* 1.39*  CALCIUM 8.7* 8.5*   Liver Function Tests  Recent Labs  02/10/16 1030  AST 50*  ALT 60*  ALKPHOS 164*  BILITOT 0.6  PROT 6.9  ALBUMIN 3.7    Recent Labs  02/10/16 1030  LIPASE 11.0  AMYLASE 25*   Cardiac Enzymes  Recent Labs  02/11/16 0211 02/11/16 0650  TROPONINI 1.99* 1.45*   BNP Invalid input(s): POCBNP D-Dimer  Recent Labs  02/10/16 1030  DDIMER 2.40*    Telemetry    Sinus - Personally Reviewed   Radiology    Dg Chest 2 View  Result Date: 02/10/2016 CLINICAL DATA:  Mid sternal chest pain increased shortness of breath EXAM: CHEST  2 VIEW COMPARISON:  02/10/2016 FINDINGS: Mild diffuse interstitial prominence but without focal infiltrate or effusion. Normal cardiomediastinal silhouette. No pneumothorax. IMPRESSION: No  active cardiopulmonary disease. Electronically Signed   By: Jasmine Pang M.D.   On: 02/10/2016 20:37   Dg Chest 2 View  Result Date: 02/10/2016 CLINICAL DATA:  Dry, nonproductive cough for 2 weeks. Shortness of breath. EXAM: CHEST  2 VIEW COMPARISON:  03/17/2015 FINDINGS: Heart and mediastinal contours are within normal limits. No focal opacities or effusions. No acute bony abnormality. IMPRESSION: No active cardiopulmonary disease. Electronically Signed   By: Charlett Nose M.D.   On: 02/10/2016 11:19   US Venous Img Lower Bilateral  Result Date: 02/10/2016 CLINICAL DATA:  Bilateral lower extremity edema EXAM: BILATERAL LOWER EXTREMITY VENOUS DUPLEX ULTRASOUND TECHNIQUE: Gray-scale sonography with graded compression, as well as color Doppler and duplex ultrasound were performed to evaluate the lower extremity deep venous systems from the level of the common femoral vein and including the common femoral, femoral, profunda femoral, popliteal and calf veins including the posterior tibial,  peroneal and gastrocnemius veins when visible. The superficial great saphenous vein was also interrogated. Spectral Doppler was utilized to evaluate flow at rest and with distal augmentation maneuvers in the common femoral, femoral and popliteal veins. COMPARISON:  None. FINDINGS: RIGHT LOWER EXTREMITY Common Femoral Vein: No evidence of thrombus. Normal compressibility, respiratory phasicity and response to augmentation. Saphenofemoral Junction: No evidence of thrombus. Normal compressibility and flow on color Doppler imaging. Profunda Femoral Vein: No evidence of thrombus. Normal compressibility and flow on color Doppler imaging. Femoral Vein: No evidence of thrombus. Normal compressibility, respiratory phasicity and response to augmentation. Popliteal Vein: No evidence of thrombus. Normal compressibility, respiratory phasicity and response to augmentation. Calf Veins: No evidence of thrombus. Normal compressibility and flow on color Doppler imaging. Superficial Great Saphenous Vein: No evidence of thrombus. Normal compressibility and flow on color Doppler imaging. Venous Reflux:  None. Other Findings:  None. LEFT LOWER EXTREMITY Common Femoral Vein: No evidence of thrombus. Normal compressibility, respiratory phasicity and response to augmentation. Saphenofemoral Junction: No evidence of thrombus. Normal compressibility and flow on color Doppler imaging. Profunda Femoral Vein: No evidence of thrombus. Normal compressibility and flow on color Doppler imaging. Femoral Vein: No evidence of thrombus. Normal compressibility, respiratory phasicity and response to augmentation. Popliteal Vein: No evidence of thrombus. Normal compressibility, respiratory phasicity and response to augmentation. Calf Veins: No evidence of thrombus. Normal compressibility and flow on color Doppler imaging. Superficial Great Saphenous Vein: No evidence of thrombus. Normal compressibility and flow on color Doppler imaging. Venous Reflux:   None. Other Findings:  None. IMPRESSION: No evidence of deep venous thrombosis in either lower extremity. Electronically Signed   By: Bretta Bang III M.D.   On: 02/10/2016 17:08   Dg Foot Complete Right  Result Date: 02/09/2016 CLINICAL DATA:  Multiple recent injuries.  Pain in swelling. EXAM: RIGHT FOOT COMPLETE - 3+ VIEW COMPARISON:  Multiple prior foot films dating back to 12/01/2015 FINDINGS: Findings consistent with a Lisfranc fracture dislocation involving the right foot. There is mild lateral subluxation of the second metatarsal base and moderate medial subluxation of the first metatarsal. There appear to be small fractures at the metatarsal bases along with a chronic fracture the base of the fifth metatarsal. There may be a Charcot component given the small vessel calcifications. CT suggested for further evaluation. 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 CT suggested for further evaluation. Electronically Signed   By: Rudie Meyer M.D.   On: 02/09/2016 13:17    Patient Profile  60 year old male with past medical history of diabetes mellitus, chronic stage III kidney disease, history of contrast nephropathy, severe coronary artery disease admitted with epigastric pain and mild CHF. Troponin abnormal.  Assessment & Plan    1 Non-ST elevation myocardial infarction-troponin is elevated. Patient is pain-free. Cardiac catheterization January 2017 showed severe diffuse LAD and circumflex disease and diffuse disease in the distal right coronary artery. Patient was felt to be high risk for PCI and also unlikely to benefit. Consideration of coronary artery bypass and graft was recommended but patient declined stating he would never be agreeable to bypass surgery and still feels that way. He also was felt to have possibly inadequate targets. I discussed repeat cardiac catheterization today  versus medical therapy. We know he has severe coronary disease and he would like to be managed medically if possible. Continue aspirin, Plavix, statin and nitrates. We had not advanced medications previously because of history of orthostasis. Continue heparin for 24 more hours and then discontinue. We will then follow for recurrent symptoms.  2 elevated d-dimer-I will arrange a VQ scan to further assess.  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 Resume Lasix 40 mg daily.  4 chronic stage III kidney disease-follow renal function closely with diuresis.  5 History of orthostatic hypertension-continue midodrine.   Signed, Olga MillersBrian Crandall Harvel, MD  02/11/2016, 8:07 AM

## 2016-02-11 NOTE — Telephone Encounter (Signed)
To let you know. I only got the d-dimer result placed on my desk. Looks like they did not fax everything.

## 2016-02-12 ENCOUNTER — Ambulatory Visit: Payer: Medicare Other | Admitting: Medical

## 2016-02-12 ENCOUNTER — Telehealth: Payer: Self-pay | Admitting: *Deleted

## 2016-02-12 LAB — BASIC METABOLIC PANEL
ANION GAP: 11 (ref 5–15)
BUN: 18 mg/dL (ref 6–20)
CHLORIDE: 102 mmol/L (ref 101–111)
CO2: 25 mmol/L (ref 22–32)
Calcium: 8.6 mg/dL — ABNORMAL LOW (ref 8.9–10.3)
Creatinine, Ser: 1.28 mg/dL — ABNORMAL HIGH (ref 0.61–1.24)
GFR calc Af Amer: >60 mL/min (ref >60)
GFR, EST NON AFRICAN AMERICAN: 58 mL/min — AB (ref >60)
Glucose, Bld: 160 mg/dL — ABNORMAL HIGH (ref 65–99)
POTASSIUM: 3.8 mmol/L (ref 3.5–5.1)
SODIUM: 138 mmol/L (ref 135–145)

## 2016-02-12 LAB — HEPARIN LEVEL (UNFRACTIONATED): Heparin Unfractionated: 0.24 IU/mL — ABNORMAL LOW (ref 0.30–0.70)

## 2016-02-12 LAB — CBC
HCT: 37.3 % — ABNORMAL LOW (ref 39.0–52.0)
HEMOGLOBIN: 12.1 g/dL — AB (ref 13.0–17.0)
MCH: 27.8 pg (ref 26.0–34.0)
MCHC: 32.4 g/dL (ref 30.0–36.0)
MCV: 85.7 fL (ref 78.0–100.0)
PLATELETS: 230 10*3/uL (ref 150–400)
RBC: 4.35 MIL/uL (ref 4.22–5.81)
RDW: 13.9 % (ref 11.5–15.5)
WBC: 5.8 10*3/uL (ref 4.0–10.5)

## 2016-02-12 LAB — GLUCOSE, CAPILLARY
GLUCOSE-CAPILLARY: 155 mg/dL — AB (ref 65–99)
GLUCOSE-CAPILLARY: 116 mg/dL — AB (ref 65–99)
GLUCOSE-CAPILLARY: 136 mg/dL — AB (ref 65–99)
GLUCOSE-CAPILLARY: 260 mg/dL — AB (ref 65–99)
GLUCOSE-CAPILLARY: 190 mg/dL — AB (ref 65–99)

## 2016-02-12 MED ORDER — ALUM & MAG HYDROXIDE-SIMETH 200-200-20 MG/5ML PO SUSP
30.0000 mL | Freq: Four times a day (QID) | ORAL | Status: DC | PRN
  Filled 2016-02-12: qty 30

## 2016-02-12 MED ORDER — INSULIN ASPART 100 UNIT/ML ~~LOC~~ SOLN
0.0000 [IU] | Freq: Three times a day (TID) | SUBCUTANEOUS | Status: DC
  Administered 2016-02-13: 07:00:00 5 [IU] via SUBCUTANEOUS

## 2016-02-12 MED ORDER — INSULIN ASPART 100 UNIT/ML ~~LOC~~ SOLN: 0.0000 [IU] | Freq: Every day | SUBCUTANEOUS | Status: DC

## 2016-02-12 MED ORDER — INSULIN GLARGINE 100 UNIT/ML ~~LOC~~ SOLN: 22.0000 [IU] | Freq: Every day | SUBCUTANEOUS | Status: DC

## 2016-02-12 MED ORDER — INSULIN ASPART 100 UNIT/ML ~~LOC~~ SOLN
4.0000 [IU] | Freq: Three times a day (TID) | SUBCUTANEOUS | Status: DC
  Administered 2016-02-13: 07:00:00 4 [IU] via SUBCUTANEOUS

## 2016-02-12 MED ORDER — INSULIN GLARGINE 100 UNIT/ML ~~LOC~~ SOLN
22.0000 [IU] | Freq: Every day | SUBCUTANEOUS | Status: DC
  Administered 2016-02-12: 22 [IU] via SUBCUTANEOUS
  Filled 2016-02-12 (×2): qty 0.22

## 2016-02-12 NOTE — Progress Notes (Signed)
Discussed with diabetic coordinator about changing from lantus back to pt's insulin pump - will give lantus tonight at 2000 and plan to resume pump st supper time tomorrow.

## 2016-02-12 NOTE — Progress Notes (Signed)
CARDIAC REHAB PHASE I   Pt in bed, states he broke his right foot and is to wear a boot with ambulation, pt does not have boot at this time, states he will have his family bring it later today. Will hold ambulation until then, RN aware. Completed MI education.  Reviewed risk factors, MI book, anti-platelet therapy, activity restrictions, ntg, exercise, heart healthy diet, carb counting, s/s CHF, sodium restrictions, daily weights, and phase 2 cardiac rehab. Pt verbalized understanding. Pt agrees to phase 2 cardiac rehab referral, [facility]. Pt in bed, call bell within reach. Will follow up tomorrow for ambulation.   [phone] Joylene GrapesEmily C Timara Loma, RN, BSN 02/12/2016 9:45 AM

## 2016-02-12 NOTE — Progress Notes (Signed)
ANTICOAGULATION CONSULT NOTE - Follow-up Consult  Pharmacy Consult for heparin Indication: chest pain/ACS  Allergies  Allergen Reactions  . Contrast Media [Iodinated Diagnostic Agents] Other (See Comments)    Renal Failure.     Patient Measurements: Height: 5\' 6"  (167.6 cm) Weight: 190 lb 11.2 oz (86.5 kg) IBW/kg (Calculated) : 63.8 Heparin Dosing Weight: 81.4  Vital Signs: Temp: 97.1 F (36.2 C) (12/01 0355) Temp Source: Axillary (12/01 0355) BP: 117/74 (12/01 0355) Pulse Rate: 62 (12/01 0355)  Labs:  Recent Labs  02/10/16 1030 02/10/16 1952 02/11/16 0211 02/11/16 0650 02/11/16 0848 02/11/16 1245 02/12/16 0309  HGB 12.2* 11.4*  --  11.7*  --   --  12.1*  HCT 36.3* 34.7*  --  35.1*  --   --  37.3*  PLT 260.0 231  --  229  --   --  230  HEPARINUNFRC  --   --  0.32  --  0.31  --  0.24*  CREATININE 1.31 1.45*  --  1.39*  --   --   --   TROPONINI  --   --  1.99* 1.45*  --  1.01*  --     Estimated Creatinine Clearance: 56.8 mL/min (by C-G formula based on SCr of 1.39 mg/dL (H)).  Assessment: 60 y.o. male on heparin for ACS. Heparin level now subtherapeutic (0.24) on gtt at 1000 units/hr. CBC stable.  Goal of Therapy:  Heparin level 0.3-0.7 units/ml Monitor platelets by anticoagulation protocol: Yes   Plan:  Increase heparin infusion to 1150 units/hr Plan to d/c heparin today per cards note 11/30  Christoper Fabianaron Nakeya Adinolfi, PharmD, BCPS Clinical pharmacist, pager [phone] 02/12/2016,4:16 AM

## 2016-02-12 NOTE — Telephone Encounter (Signed)
Patient called asking about wearing boot. I called patient he states that Dr. Ardelle AntonWagoner wanted him to wear boot and be nonweightbearing, however is had a heart attack on 02-10-15 and questions how to proceed with cardiac rehab. I recommended pt get a knee scooter and to ask if there was modified exercise he could do with it. Patient says that he will stay off foot as much and possible and see about getting the knee scooter.

## 2016-02-12 NOTE — Progress Notes (Signed)
Patient Name: Timothy Alvarez Date of Encounter: 02/12/2016  [facility]  Hospital Problem List     Active Problems:   NSTEMI (non-ST elevated myocardial infarction) [facility](HCC)     Subjective   Denies CP or dyspnea  Inpatient Medications    Scheduled Meds: . aspirin EC  81 mg Oral Daily  . clopidogrel  75 mg Oral Daily  . famotidine  10 mg Oral Daily  . fludrocortisone  100 mcg Oral BID WC  . furosemide  40 mg Oral Daily  . gabapentin  600 mg Oral TID  . insulin pump   Subcutaneous TID AC, HS, 0200  . isosorbide mononitrate  60 mg Oral Daily  . lisinopril  10 mg Oral Daily  . midodrine  10 mg Oral BID WC  . rosuvastatin  40 mg Oral Daily  . senna-docusate  1 tablet Oral Daily   Continuous Infusions: . heparin 1,150 Units/hr (02/12/16 0700)   PRN Meds: acetaminophen, HYDROcodone-acetaminophen, meclizine, nitroGLYCERIN, ondansetron (ZOFRAN) IV, technetium TC 60M diethylenetriame-pentaacetic acid, zolpidem   Vital Signs    Vitals:   02/11/16 1240 02/11/16 1945 02/11/16 2000 02/12/16 0355  BP: 140/72 129/69  117/74  Pulse: 75 74  62  Resp: 14 18 (!) 24 16  Temp: 98 F (36.7 C) 97.2 F (36.2 C)  97.1 F (36.2 C)  TempSrc: Oral Axillary  Axillary  SpO2: 98% 98%  94%  Weight:    190 lb 11.2 oz (86.5 kg)  Height:        Intake/Output Summary (Last 24 hours) at 02/12/16 0819 Last data filed at 02/12/16 0700  Gross per 24 hour  Intake             1214 ml  Output             1700 ml  Net             -486 ml   Filed Weights   02/10/16 1859 02/11/16 0054 02/12/16 0355  Weight: 188 lb (85.3 kg) 189 lb 9.5 oz (86 kg) 190 lb 11.2 oz (86.5 kg)    Physical Exam   GEN: Well nourished, well developed, in no acute distress.  HEENT: Grossly normal.  Neck: Supple Cardiac: RRR, trace peripheral edema Respiratory:  CTA GI: Soft, nontender, nondistended MS: no deformity or atrophy. Skin: warm and dry, no rash. Neuro:  Strength and sensation are  intact. Psych: AAOx3.  Normal affect.  Labs    CBC  Recent Labs  02/10/16 1030  02/11/16 0650 02/12/16 0309  WBC 6.6  < > 5.8 5.8  NEUTROABS 4.7  --   --   --   HGB 12.2*  < > 11.7* 12.1*  HCT 36.3*  < > 35.1* 37.3*  MCV 84.3  < > 85.4 85.7  PLT 260.0  < > 229 230  < > = values in this interval not displayed. Basic Metabolic Panel  Recent Labs  02/11/16 0650 02/12/16 0309  NA 141 138  K 3.7 3.8  CL 104 102  CO2 28 25  GLUCOSE 176* 160*  BUN 14 18  CREATININE 1.39* 1.28*  CALCIUM 8.5* 8.6*   Liver Function Tests  Recent Labs  02/10/16 1030  AST 50*  ALT 60*  ALKPHOS 164*  BILITOT 0.6  PROT 6.9  ALBUMIN 3.7    Recent Labs  02/10/16 1030  LIPASE 11.0  AMYLASE 25*   Cardiac Enzymes  Recent Labs  02/11/16 0211 02/11/16 0650 02/11/16 1245  TROPONINI 1.99* 1.45* 1.01*   BNP Invalid input(s): POCBNP D-Dimer  Recent Labs  02/10/16 1030  DDIMER 2.40*    Telemetry    Sinus with rare PVC- Personally Reviewed   Radiology    Dg Chest 2 View  Result Date: 02/10/2016 CLINICAL DATA:  Mid sternal chest pain increased shortness of breath EXAM: CHEST  2 VIEW COMPARISON:  02/10/2016 FINDINGS: Mild diffuse interstitial prominence but without focal infiltrate or effusion. Normal cardiomediastinal silhouette. No pneumothorax. IMPRESSION: No active cardiopulmonary disease. Electronically Signed   By: Jasmine PangKim  Fujinaga M.D.   On: 02/10/2016 20:37   Dg Chest 2 View  Result Date: 02/10/2016 CLINICAL DATA:  Dry, nonproductive cough for 2 weeks. Shortness of breath. EXAM: CHEST  2 VIEW COMPARISON:  03/17/2015 FINDINGS: Heart and mediastinal contours are within normal limits. No focal opacities or effusions. No acute bony abnormality. IMPRESSION: No active cardiopulmonary disease. Electronically Signed   By: Charlett NoseKevin  Dover M.D.   On: 02/10/2016 11:19   Nm Pulmonary Perf And Vent  Result Date: 02/11/2016 CLINICAL DATA:  60 year old male with shortness of breath  and decreased stamina for 1 week. Initial encounter. EXAM: NUCLEAR MEDICINE VENTILATION - PERFUSION LUNG SCAN TECHNIQUE: Ventilation images were obtained in multiple projections using inhaled aerosol Tc-4737m DTPA. Perfusion images were obtained in multiple projections after intravenous injection of Tc-4737m MAA. RADIOPHARMACEUTICALS:  31.0 mCi Technetium-5437m DTPA aerosol inhalation and 4.1 mCi Technetium-4137m MAA IV COMPARISON:  Chest radiographs 02/10/2016 FINDINGS: Ventilation: Homogeneous ventilation activity. No focal ventilation defect. Incidental esophageal and gastric activity. Perfusion: Homogeneous perfusion activity in both lungs. No wedge shaped peripheral perfusion defects to suggest acute pulmonary embolism. IMPRESSION: Normal VQ scan, negative for pulmonary embolism. Electronically Signed   By: Odessa FlemingH  Hall M.D.   On: 02/11/2016 16:47   [place] Venous Img Lower Bilateral  Result Date: 02/10/2016 CLINICAL DATA:  Bilateral lower extremity edema EXAM: BILATERAL LOWER EXTREMITY VENOUS DUPLEX ULTRASOUND TECHNIQUE: Gray-scale sonography with graded compression, as well as color Doppler and duplex ultrasound were performed to evaluate the lower extremity deep venous systems from the level of the common femoral vein and including the common femoral, femoral, profunda femoral, popliteal and calf veins including the posterior tibial, peroneal and gastrocnemius veins when visible. The superficial great saphenous vein was also interrogated. Spectral Doppler was utilized to evaluate flow at rest and with distal augmentation maneuvers in the common femoral, femoral and popliteal veins. COMPARISON:  None. FINDINGS: RIGHT LOWER EXTREMITY Common Femoral Vein: No evidence of thrombus. Normal compressibility, respiratory phasicity and response to augmentation. Saphenofemoral Junction: No evidence of thrombus. Normal compressibility and flow on color Doppler imaging. Profunda Femoral Vein: No evidence of thrombus. Normal  compressibility and flow on color Doppler imaging. Femoral Vein: No evidence of thrombus. Normal compressibility, respiratory phasicity and response to augmentation. Popliteal Vein: No evidence of thrombus. Normal compressibility, respiratory phasicity and response to augmentation. Calf Veins: No evidence of thrombus. Normal compressibility and flow on color Doppler imaging. Superficial Great Saphenous Vein: No evidence of thrombus. Normal compressibility and flow on color Doppler imaging. Venous Reflux:  None. Other Findings:  None. LEFT LOWER EXTREMITY Common Femoral Vein: No evidence of thrombus. Normal compressibility, respiratory phasicity and response to augmentation. Saphenofemoral Junction: No evidence of thrombus. Normal compressibility and flow on color Doppler imaging. Profunda Femoral Vein: No evidence of thrombus. Normal compressibility and flow on color Doppler imaging. Femoral Vein: No evidence of thrombus. Normal compressibility, respiratory phasicity and response to augmentation. Popliteal Vein: No evidence of thrombus. Normal compressibility,  respiratory phasicity and response to augmentation. Calf Veins: No evidence of thrombus. Normal compressibility and flow on color Doppler imaging. Superficial Great Saphenous Vein: No evidence of thrombus. Normal compressibility and flow on color Doppler imaging. Venous Reflux:  None. Other Findings:  None. IMPRESSION: No evidence of deep venous thrombosis in either lower extremity. Electronically Signed   By: Bretta Bang III M.D.   On: 02/10/2016 17:08    Patient Profile     59 year old male with past medical history of diabetes mellitus, chronic stage III kidney disease, history of contrast nephropathy, severe coronary artery disease admitted with epigastric pain and mild CHF. Ruled in for small NSTEMI.  Assessment & Plan    1 Non-ST elevation myocardial infarction-Cardiac catheterization January 2017 showed severe diffuse LAD and circumflex  disease and diffuse disease in the distal right coronary artery. Patient was felt to be high risk for PCI and also unlikely to benefit. Consideration of coronary artery bypass and graft was recommended but patient declined stating he would never be agreeable to bypass surgery and still feels that way. He also was felt to have possibly inadequate targets. I discussed repeat cardiac catheterization versus medical therapy. We know he has severe coronary disease and he would like to be managed medically if possible. Continue aspirin, Plavix, statin and nitrates. We had not advanced medications previously because of history of orthostasis. Discontinue heparin; watch for recurrent symptoms; if remains pain free, DC in AM and FU with me in [facility].  2 elevated d-dimer-VQ normal.  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 Continue Lasix 40 mg daily.  4 chronic stage III kidney disease-follow renal function closely with diuresis.  5 History of orthostatic hypertension-continue midodrine.   Signed, Olga Millers, MD  02/12/2016, 8:19 AM

## 2016-02-13 DIAGNOSIS — I951 Orthostatic hypotension: Secondary | ICD-10-CM

## 2016-02-13 DIAGNOSIS — Z794 Long term (current) use of insulin: Secondary | ICD-10-CM

## 2016-02-13 DIAGNOSIS — E1121 Type 2 diabetes mellitus with diabetic nephropathy: Secondary | ICD-10-CM

## 2016-02-13 DIAGNOSIS — I10 Essential (primary) hypertension: Secondary | ICD-10-CM | POA: Diagnosis present

## 2016-02-13 LAB — BASIC METABOLIC PANEL
ANION GAP: 11 (ref 5–15)
BUN: 20 mg/dL (ref 6–20)
CHLORIDE: 99 mmol/L — AB (ref 101–111)
CO2: 24 mmol/L (ref 22–32)
Calcium: 8.5 mg/dL — ABNORMAL LOW (ref 8.9–10.3)
Creatinine, Ser: 1.39 mg/dL — ABNORMAL HIGH (ref 0.61–1.24)
GFR, EST NON AFRICAN AMERICAN: 53 mL/min — AB (ref >60)
Glucose, Bld: 284 mg/dL — ABNORMAL HIGH (ref 65–99)
POTASSIUM: 3.8 mmol/L (ref 3.5–5.1)
SODIUM: 134 mmol/L — AB (ref 135–145)

## 2016-02-13 LAB — CBC
HCT: 36.7 % — ABNORMAL LOW (ref 39.0–52.0)
HEMOGLOBIN: 12.1 g/dL — AB (ref 13.0–17.0)
MCH: 27.8 pg (ref 26.0–34.0)
MCHC: 33 g/dL (ref 30.0–36.0)
MCV: 84.4 fL (ref 78.0–100.0)
Platelets: 234 10*3/uL (ref 150–400)
RBC: 4.35 MIL/uL (ref 4.22–5.81)
RDW: 13.4 % (ref 11.5–15.5)
WBC: 6.5 10*3/uL (ref 4.0–10.5)

## 2016-02-13 LAB — GLUCOSE, CAPILLARY
GLUCOSE-CAPILLARY: 321 mg/dL — AB (ref 65–99)
GLUCOSE-CAPILLARY: 253 mg/dL — AB (ref 65–99)

## 2016-02-13 MED ORDER — LOSARTAN POTASSIUM 50 MG PO TABS: 25.0000 mg | ORAL_TABLET | Freq: Every day | ORAL | Status: DC

## 2016-02-13 MED ORDER — LOSARTAN POTASSIUM 50 MG PO TABS: 50.0000 mg | ORAL_TABLET | Freq: Every day | ORAL | Status: DC

## 2016-02-13 MED ORDER — LOSARTAN POTASSIUM 25 MG PO TABS: 25.0000 mg | ORAL_TABLET | Freq: Every day | ORAL | 5 refills | Status: DC

## 2016-02-13 MED ORDER — ZOLPIDEM TARTRATE 5 MG PO TABS
5.0000 mg | ORAL_TABLET | Freq: Once | ORAL | Status: AC
  Administered 2016-02-13: 5 mg via ORAL

## 2016-02-13 NOTE — Progress Notes (Signed)
DAILY PROGRESS NOTE  Subjective:  Feels well today - wants to go home with medical therapy. Not interested in bypass or intervention at this time. Has had a cough for 2 weeks.  Objective:  Temp:  [97 F (36.1 C)-98.5 F (36.9 C)] 98.5 F (36.9 C) (12/02 0700) Pulse Rate:  [72-80] 75 (12/02 0700) Resp:  [14-25] 17 (12/02 0700) BP: (140-183)/(69-92) 167/77 (12/02 0700) SpO2:  [96 %-100 %] 98 % (12/02 0700) Weight:  [191 lb 9.3 oz (86.9 kg)] 191 lb 9.3 oz (86.9 kg) (12/02 0229) Weight change: 14.1 oz (0.4 kg)  Intake/Output from previous day: 12/01 0701 - 12/02 0700 In: 360 [P.O.:360] Out: 2650 [Urine:2650]  Intake/Output from this shift: Total I/O In: 240 [P.O.:240] Out: -   Medications: No current facility-administered medications on file prior to encounter.    Current Outpatient Prescriptions on File Prior to Encounter  Medication Sig Dispense Refill  . aspirin 81 MG tablet Take 81 mg by mouth daily.    . clopidogrel (PLAVIX) 75 MG tablet Take 1 tablet (75 mg total) by mouth daily. 90 tablet 3  . co-enzyme Q-10 30 MG capsule Take 30 mg by mouth daily.     . fludrocortisone (FLORINEF) 0.1 MG tablet TAKE 1 TABLET BY MOUTH TWICE DAILY 60 tablet 3  . furosemide (LASIX) 20 MG tablet Take 1 tablet (20 mg total) by mouth 2 (two) times daily. 60 tablet 6  . gabapentin (NEURONTIN) 600 MG tablet TAKE 1 TABLET BY MOUTH 3 TIMES DAILY. 90 tablet 2  . glucagon 1 MG injection Inject 1 mg into the vein once as needed (Emergency Test Kit). Reported on 04/07/2015    . HYDROcodone-acetaminophen (NORCO/VICODIN) 5-325 MG tablet Take 1 tablet by mouth every 6 (six) hours as needed for severe pain. 60 tablet 0  . insulin lispro (HUMALOG) 100 UNIT/ML injection Inject 1-40 Units into the skin as directed. Use in Insulin Pump, Max daily dose: 100 units/day    . isosorbide mononitrate (IMDUR) 60 MG 24 hr tablet Take 1 tablet (60 mg total) by mouth daily. 30 tablet 11  . lisinopril  (PRINIVIL,ZESTRIL) 10 MG tablet Take 10 mg by mouth daily.  11  . meclizine (ANTIVERT) 12.5 MG tablet Take 1 tablet (12.5 mg total) by mouth 3 (three) times daily as needed for dizziness. 30 tablet 0  . midodrine (PROAMATINE) 10 MG tablet TAKE 1 TABLET BY MOUTH TWICE DAILY 60 tablet 3  . nitroGLYCERIN (NITROSTAT) 0.4 MG SL tablet Place 1 tablet (0.4 mg total) under the tongue every 5 (five) minutes x 3 doses as needed for chest pain. 25 tablet 12  . ranitidine (ZANTAC) 150 MG tablet TAKE 1 TABLET BY MOUTH TWICE DAILY 60 tablet 5  . SENEXON-S 8.6-50 MG tablet TAKE 1 TABLET BY MOUTH DAILY. 100 tablet 1  . zolpidem (AMBIEN CR) 12.5 MG CR tablet TAKE 1 TABLET BY MOUTH AT BEDTIME 30 tablet 2  . acetaminophen (TYLENOL) 325 MG tablet Take 650 mg by mouth every 6 (six) hours as needed for mild pain.    Marland Kitchen atorvastatin (LIPITOR) 40 MG tablet Take 20 mg by mouth daily. 1/2 tablet daily     . doxycycline (VIBRAMYCIN) 100 MG capsule Take 1 capsule (100 mg total) by mouth 2 (two) times daily. (Patient not taking: Reported on 02/10/2016) 14 capsule 0  . NONFORMULARY OR COMPOUNDED [place] compound:  Onychomycosis Nail lacquer - Fluconazole 2 %, Terbinafine 1%, DMSO, apply to affected area daily. (Patient not taking:  Reported on 02/10/2016) 120 each 2  . Vitamin D, Ergocalciferol, (DRISDOL) 50000 units CAPS capsule Take 1 capsule (50,000 Units total) by mouth every 7 (seven) days. (Patient not taking: Reported on 02/10/2016) 6 capsule 0    Physical Exam: General appearance: alert and no distress Lungs: clear to auscultation bilaterally Heart: regular rate and rhythm Extremities: walking boot on right lower leg Neurologic: Grossly normal  Lab Results: Results for orders placed or performed during the hospital encounter of 02/10/16 (from the past 48 hour(s))  Glucose, capillary     Status: Abnormal   Collection Time: 02/11/16 12:42 PM  Result Value Ref Range   Glucose-Capillary 190 (H) 65 - 99  mg/dL  Troponin I     Status: Abnormal   Collection Time: 02/11/16 12:45 PM  Result Value Ref Range   Troponin I 1.01 (HH) <0.03 ng/mL    Comment: CRITICAL VALUE NOTED.  VALUE IS CONSISTENT WITH PREVIOUSLY REPORTED AND CALLED VALUE.  Glucose, capillary     Status: Abnormal   Collection Time: 02/11/16  5:11 PM  Result Value Ref Range   Glucose-Capillary 172 (H) 65 - 99 mg/dL   Comment 1 Notify RN   Glucose, capillary     Status: Abnormal   Collection Time: 02/11/16  9:08 PM  Result Value Ref Range   Glucose-Capillary 109 (H) 65 - 99 mg/dL  Glucose, capillary     Status: Abnormal   Collection Time: 02/12/16  2:36 AM  Result Value Ref Range   Glucose-Capillary 155 (H) 65 - 99 mg/dL  Heparin level (unfractionated)     Status: Abnormal   Collection Time: 02/12/16  3:09 AM  Result Value Ref Range   Heparin Unfractionated 0.24 (L) 0.30 - 0.70 IU/mL    Comment:        IF HEPARIN RESULTS ARE BELOW EXPECTED VALUES, AND PATIENT DOSAGE HAS BEEN CONFIRMED, SUGGEST FOLLOW UP TESTING OF ANTITHROMBIN III LEVELS.   CBC     Status: Abnormal   Collection Time: 02/12/16  3:09 AM  Result Value Ref Range   WBC 5.8 4.0 - 10.5 K/uL   RBC 4.35 4.22 - 5.81 MIL/uL   Hemoglobin 12.1 (L) 13.0 - 17.0 g/dL   HCT 37.3 (L) 39.0 - 52.0 %   MCV 85.7 78.0 - 100.0 fL   MCH 27.8 26.0 - 34.0 pg   MCHC 32.4 30.0 - 36.0 g/dL   RDW 13.9 11.5 - 15.5 %   Platelets 230 150 - 400 K/uL  Basic metabolic panel     Status: Abnormal   Collection Time: 02/12/16  3:09 AM  Result Value Ref Range   Sodium 138 135 - 145 mmol/L   Potassium 3.8 3.5 - 5.1 mmol/L   Chloride 102 101 - 111 mmol/L   CO2 25 22 - 32 mmol/L   Glucose, Bld 160 (H) 65 - 99 mg/dL   BUN 18 6 - 20 mg/dL   Creatinine, Ser 1.28 (H) 0.61 - 1.24 mg/dL   Calcium 8.6 (L) 8.9 - 10.3 mg/dL   GFR calc non Af Amer 58 (L) >60 mL/min   GFR calc Af Amer >60 >60 mL/min    Comment: (NOTE) The eGFR has been calculated using the CKD EPI equation. This calculation  has not been validated in all clinical situations. eGFR's persistently <60 mL/min signify possible Chronic Kidney Disease.    Anion gap 11 5 - 15  Glucose, capillary     Status: Abnormal   Collection Time: 02/12/16  6:15 AM  Result Value Ref Range   Glucose-Capillary 116 (H) 65 - 99 mg/dL  Glucose, capillary     Status: Abnormal   Collection Time: 02/12/16  1:04 PM  Result Value Ref Range   Glucose-Capillary 136 (H) 65 - 99 mg/dL  Glucose, capillary     Status: Abnormal   Collection Time: 02/12/16  5:29 PM  Result Value Ref Range   Glucose-Capillary 260 (H) 65 - 99 mg/dL  Glucose, capillary     Status: Abnormal   Collection Time: 02/12/16  9:13 PM  Result Value Ref Range   Glucose-Capillary 190 (H) 65 - 99 mg/dL  Glucose, capillary     Status: Abnormal   Collection Time: 02/13/16  1:07 AM  Result Value Ref Range   Glucose-Capillary 321 (H) 65 - 99 mg/dL  CBC     Status: Abnormal   Collection Time: 02/13/16  4:44 AM  Result Value Ref Range   WBC 6.5 4.0 - 10.5 K/uL   RBC 4.35 4.22 - 5.81 MIL/uL   Hemoglobin 12.1 (L) 13.0 - 17.0 g/dL   HCT 36.7 (L) 39.0 - 52.0 %   MCV 84.4 78.0 - 100.0 fL   MCH 27.8 26.0 - 34.0 pg   MCHC 33.0 30.0 - 36.0 g/dL   RDW 13.4 11.5 - 15.5 %   Platelets 234 150 - 400 K/uL  Basic metabolic panel     Status: Abnormal   Collection Time: 02/13/16  4:44 AM  Result Value Ref Range   Sodium 134 (L) 135 - 145 mmol/L   Potassium 3.8 3.5 - 5.1 mmol/L   Chloride 99 (L) 101 - 111 mmol/L   CO2 24 22 - 32 mmol/L   Glucose, Bld 284 (H) 65 - 99 mg/dL   BUN 20 6 - 20 mg/dL   Creatinine, Ser 1.39 (H) 0.61 - 1.24 mg/dL   Calcium 8.5 (L) 8.9 - 10.3 mg/dL   GFR calc non Af Amer 53 (L) >60 mL/min   GFR calc Af Amer >60 >60 mL/min    Comment: (NOTE) The eGFR has been calculated using the CKD EPI equation. This calculation has not been validated in all clinical situations. eGFR's persistently <60 mL/min signify possible Chronic Kidney Disease.    Anion gap 11 5  - 15  Glucose, capillary     Status: Abnormal   Collection Time: 02/13/16  5:58 AM  Result Value Ref Range   Glucose-Capillary 253 (H) 65 - 99 mg/dL    Imaging: Nm Pulmonary Perf And Vent  Result Date: 02/11/2016 CLINICAL DATA:  60 year old male with shortness of breath and decreased stamina for 1 week. Initial encounter. EXAM: NUCLEAR MEDICINE VENTILATION - PERFUSION LUNG SCAN TECHNIQUE: Ventilation images were obtained in multiple projections using inhaled aerosol Tc-2mDTPA. Perfusion images were obtained in multiple projections after intravenous injection of Tc-922mAA. RADIOPHARMACEUTICALS:  31.0 mCi Technetium-9926mPA aerosol inhalation and 4.1 mCi Technetium-71m41m IV COMPARISON:  Chest radiographs 02/10/2016 FINDINGS: Ventilation: Homogeneous ventilation activity. No focal ventilation defect. Incidental esophageal and gastric activity. Perfusion: Homogeneous perfusion activity in both lungs. No wedge shaped peripheral perfusion defects to suggest acute pulmonary embolism. IMPRESSION: Normal VQ scan, negative for pulmonary embolism. Electronically Signed   By: H  HGenevie Ann.   On: 02/11/2016 16:47    Assessment:  Principal Problem:   NSTEMI (non-ST elevated myocardial infarction) ([facility] Problems:   Type 2 diabetes mellitus with renal manifestations (HCC)SareptaEssential hypertension   Plan:  1. Mr. WebbDearmass not wish for  further interventions at this time. Will d/c home today. He may resume his insulin pump after 7pm tonight. Dry cough for 2 weeks - may be lisinopril. D/c and start losartan 25 mg daily. He is on crestor, imdur, midodrine for orthostatic hypotension (although BP is elevated today - will need to re-address this), aspirin, plavix and lasix. Ok to d/c home today. Follow-up with Dr. Stanford Breed in [facility].  Time Spent Directly with Patient:  15 minutes  Length of Stay:  LOS: 3 days   Pixie Casino, MD, [facility] Attending Cardiologist [place] 02/13/2016, 9:41 AM

## 2016-02-13 NOTE — Discharge Summary (Signed)
Discharge Summary    Patient ID: Timothy Alvarez,  MRN: [id number], DOB/AGE: 62/08/1955 62 y.o.  Admit date: 02/10/2016 Discharge date: 02/13/2016  Primary Care Provider: Mackie Pai Primary Cardiologist: Dr. Stanford Breed   Discharge Diagnoses    Principal Problem:   NSTEMI (non-ST elevated myocardial infarction) [facility]) Active Problems:   Type 2 diabetes mellitus with renal manifestations ([place])   Essential hypertension   Allergies Allergies  Allergen Reactions  . Contrast Media [Iodinated Diagnostic Agents] Other (See Comments)    Renal Failure.   . Lisinopril Cough    Diagnostic Studies/Procedures    Cardiac Panel (last 3 results)  Recent Labs  02/11/16 0211 02/11/16 0650 02/11/16 1245  TROPONINI 1.99* 1.45* 1.01*   Pt. Declined Cath- medical management elected    History of Present Illness     60 year old male with past medical history of diabetes mellitus, chronic stage III kidney disease, history of contrast nephropathy, severe coronary artery disease admitted with epigastric pain and mild CHF. Ruled in for small NSTEMI.  Hospital Course     1. Non-ST elevation myocardial infarction-Cardiac catheterization January 2017 showed severe diffuse LAD and circumflex disease and diffuse disease in the distal right coronary artery. Patient was felt to be high risk for PCI and also unlikely to benefit. Consideration of coronary artery bypass and graft was recommended but patient declined stating he would never be agreeable to bypass surgery and still feels that way. He also was felt to have possibly inadequate targets. Dr. Stanford Breed discussed repeat cardiac catheterization versus medical therapy with patient. Pt stated he would like to be managed medically if possible. Continue aspirin, Plavix, statin and nitrates. Medication titration has been limited previously because of history of orthostasis.   2. elevated d-dimer-VQ normal.  3. 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 Continue Lasix 40 mg daily at time of d/c. ACE-I changed to ARB given issues with chronic cough with ACE.   4. chronic stage III kidney disease-renal function remained stable. SCr day of discharge was 1.39.  5. History of orthostatic hypertension-continue midodrine and florinef  6. DM: Diabetes Coordinator helped to assist with insulin management. Patient will continue home insulin pump post discharge. F/u with PCP.   7. Dispo: patient was last seen and examined by Dr. Debara Pickett who determined he was stable for discharge home. He will f/u with Dr. Stanford Breed.   Consultants: none    Discharge Vitals Blood pressure (!) 167/77, pulse 75, temperature 98.5 F (36.9 C), temperature source Oral, resp. rate 17, height 5' 6"  (1.676 m), weight 191 lb 9.3 oz (86.9 kg), SpO2 98 %.  Filed Weights   02/11/16 0054 02/12/16 0355 02/13/16 0229  Weight: 189 lb 9.5 oz (86 kg) 190 lb 11.2 oz (86.5 kg) 191 lb 9.3 oz (86.9 kg)    Labs & Radiologic Studies    CBC  Recent Labs  02/10/16 1030  02/12/16 0309 02/13/16 0444  WBC 6.6  < > 5.8 6.5  NEUTROABS 4.7  --   --   --   HGB 12.2*  < > 12.1* 12.1*  HCT 36.3*  < > 37.3* 36.7*  MCV 84.3  < > 85.7 84.4  PLT 260.0  < > 230 234  < > = values in this interval not displayed. Basic Metabolic Panel  Recent Labs  02/12/16 0309 02/13/16 0444  NA 138 134*  K 3.8 3.8  CL 102 99*  CO2 25 24  GLUCOSE 160* 284*  BUN  18 20  CREATININE 1.28* 1.39*  CALCIUM 8.6* 8.5*   Liver Function Tests  Recent Labs  02/10/16 1030  AST 50*  ALT 60*  ALKPHOS 164*  BILITOT 0.6  PROT 6.9  ALBUMIN 3.7    Recent Labs  02/10/16 1030  LIPASE 11.0  AMYLASE 25*   Cardiac Enzymes  Recent Labs  02/11/16 0211 02/11/16 0650 02/11/16 1245  TROPONINI 1.99* 1.45* 1.01*   BNP Invalid input(s): POCBNP D-Dimer  Recent Labs  02/10/16 1030  DDIMER 2.40*   Hemoglobin A1C No results for input(s):  HGBA1C in the last 72 hours. Fasting Lipid Panel No results for input(s): CHOL, HDL, LDLCALC, TRIG, CHOLHDL, LDLDIRECT in the last 72 hours. Thyroid Function Tests No results for input(s): TSH, T4TOTAL, T3FREE, THYROIDAB in the last 72 hours.  Invalid input(s): FREET3 _____________  Dg Chest 2 View  Result Date: 02/10/2016 CLINICAL DATA:  Mid sternal chest pain increased shortness of breath EXAM: CHEST  2 VIEW COMPARISON:  02/10/2016 FINDINGS: Mild diffuse interstitial prominence but without focal infiltrate or effusion. Normal cardiomediastinal silhouette. No pneumothorax. IMPRESSION: No active cardiopulmonary disease. Electronically Signed   By: Donavan Foil M.D.   On: 02/10/2016 20:37   Dg Chest 2 View  Result Date: 02/10/2016 CLINICAL DATA:  Dry, nonproductive cough for 2 weeks. Shortness of breath. EXAM: CHEST  2 VIEW COMPARISON:  03/17/2015 FINDINGS: Heart and mediastinal contours are within normal limits. No focal opacities or effusions. No acute bony abnormality. IMPRESSION: No active cardiopulmonary disease. Electronically Signed   By: Rolm Baptise M.D.   On: 02/10/2016 11:19   Nm Pulmonary Perf And Vent  Result Date: 02/11/2016 CLINICAL DATA:  60 year old male with shortness of breath and decreased stamina for 1 week. Initial encounter. EXAM: NUCLEAR MEDICINE VENTILATION - PERFUSION LUNG SCAN TECHNIQUE: Ventilation images were obtained in multiple projections using inhaled aerosol Tc-17mDTPA. Perfusion images were obtained in multiple projections after intravenous injection of Tc-961mAA. RADIOPHARMACEUTICALS:  31.0 mCi Technetium-9972mPA aerosol inhalation and 4.1 mCi Technetium-57m6m IV COMPARISON:  Chest radiographs 02/10/2016 FINDINGS: Ventilation: Homogeneous ventilation activity. No focal ventilation defect. Incidental esophageal and gastric activity. Perfusion: Homogeneous perfusion activity in both lungs. No wedge shaped peripheral perfusion defects to suggest acute  pulmonary embolism. IMPRESSION: Normal VQ scan, negative for pulmonary embolism. Electronically Signed   By: H  HGenevie Ann.   On: 02/11/2016 16:47   Us VKoreaous Img Lower Bilateral  Result Date: 02/10/2016 CLINICAL DATA:  Bilateral lower extremity edema EXAM: BILATERAL LOWER EXTREMITY VENOUS DUPLEX ULTRASOUND TECHNIQUE: Gray-scale sonography with graded compression, as well as color Doppler and duplex ultrasound were performed to evaluate the lower extremity deep venous systems from the level of the common femoral vein and including the common femoral, femoral, profunda femoral, popliteal and calf veins including the posterior tibial, peroneal and gastrocnemius veins when visible. The superficial great saphenous vein was also interrogated. Spectral Doppler was utilized to evaluate flow at rest and with distal augmentation maneuvers in the common femoral, femoral and popliteal veins. COMPARISON:  None. FINDINGS: RIGHT LOWER EXTREMITY Common Femoral Vein: No evidence of thrombus. Normal compressibility, respiratory phasicity and response to augmentation. Saphenofemoral Junction: No evidence of thrombus. Normal compressibility and flow on color Doppler imaging. Profunda Femoral Vein: No evidence of thrombus. Normal compressibility and flow on color Doppler imaging. Femoral Vein: No evidence of thrombus. Normal compressibility, respiratory phasicity and response to augmentation. Popliteal Vein: No evidence of thrombus. Normal compressibility, respiratory phasicity and response to augmentation. Calf Veins: No evidence  of thrombus. Normal compressibility and flow on color Doppler imaging. Superficial Great Saphenous Vein: No evidence of thrombus. Normal compressibility and flow on color Doppler imaging. Venous Reflux:  None. Other Findings:  None. LEFT LOWER EXTREMITY Common Femoral Vein: No evidence of thrombus. Normal compressibility, respiratory phasicity and response to augmentation. Saphenofemoral Junction: No  evidence of thrombus. Normal compressibility and flow on color Doppler imaging. Profunda Femoral Vein: No evidence of thrombus. Normal compressibility and flow on color Doppler imaging. Femoral Vein: No evidence of thrombus. Normal compressibility, respiratory phasicity and response to augmentation. Popliteal Vein: No evidence of thrombus. Normal compressibility, respiratory phasicity and response to augmentation. Calf Veins: No evidence of thrombus. Normal compressibility and flow on color Doppler imaging. Superficial Great Saphenous Vein: No evidence of thrombus. Normal compressibility and flow on color Doppler imaging. Venous Reflux:  None. Other Findings:  None. IMPRESSION: No evidence of deep venous thrombosis in either lower extremity. Electronically Signed   By: Lowella Grip III M.D.   On: 02/10/2016 17:08   Dg Foot Complete Right  Result Date: 02/09/2016 CLINICAL DATA:  Multiple recent injuries.  Pain in swelling. EXAM: RIGHT FOOT COMPLETE - 3+ VIEW COMPARISON:  Multiple prior foot films dating back to 12/01/2015 FINDINGS: Findings consistent with a Lisfranc fracture dislocation involving the right foot. There is mild lateral subluxation of the second metatarsal base and moderate medial subluxation of the first metatarsal. There appear to be small fractures at the metatarsal bases along with a chronic fracture the base of the fifth metatarsal. There may be a Charcot component given the small vessel calcifications. CT suggested for further evaluation. 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 CT suggested for further evaluation. Electronically Signed   By: Marijo Sanes M.D.   On: 02/09/2016 13:17   Disposition   Pt is being discharged home today in good condition.  Follow-up Plans & Appointments    Follow-up Information    Kirk Ruths, MD Follow up.   Specialty:  Cardiology Why:   our office will call you with a hospital follow-up appointment  Contact information: [address] [place] [address] [phone]          Discharge Instructions    Amb Referral to Cardiac Rehabilitation    Complete by:  As directed    Diagnosis:  NSTEMI   Diet - low sodium heart healthy    Complete by:  As directed    Increase activity slowly    Complete by:  As directed       Discharge Medications   Current Discharge Medication List    START taking these medications   Details  losartan (COZAAR) 25 MG tablet Take 1 tablet (25 mg total) by mouth daily. Qty: 30 tablet, Refills: 5      CONTINUE these medications which have NOT CHANGED   Details  aspirin 81 MG tablet Take 81 mg by mouth daily.    clopidogrel (PLAVIX) 75 MG tablet Take 1 tablet (75 mg total) by mouth daily. Qty: 90 tablet, Refills: 3    co-enzyme Q-10 30 MG capsule Take 30 mg by mouth daily.     fludrocortisone (FLORINEF) 0.1 MG tablet TAKE 1 TABLET BY MOUTH TWICE DAILY Qty: 60 tablet, Refills: 3    furosemide (LASIX) 20 MG tablet Take 1 tablet (20 mg total) by mouth 2 (two) times daily. Qty: 60 tablet, Refills: 6    gabapentin (NEURONTIN) 600 MG tablet TAKE  1 TABLET BY MOUTH 3 TIMES DAILY. Qty: 90 tablet, Refills: 2    glucagon 1 MG injection Inject 1 mg into the vein once as needed (Emergency Test Kit). Reported on 04/07/2015    HYDROcodone-acetaminophen (NORCO/VICODIN) 5-325 MG tablet Take 1 tablet by mouth every 6 (six) hours as needed for severe pain. Qty: 60 tablet, Refills: 0    insulin lispro (HUMALOG) 100 UNIT/ML injection Inject 1-40 Units into the skin as directed. Use in Insulin Pump, Max daily dose: 100 units/day    isosorbide mononitrate (IMDUR) 60 MG 24 hr tablet Take 1 tablet (60 mg total) by mouth daily. Qty: 30 tablet, Refills: 11    meclizine (ANTIVERT) 12.5 MG tablet Take 1 tablet (12.5 mg total) by mouth 3 (three) times daily as needed for dizziness. Qty: 30  tablet, Refills: 0    midodrine (PROAMATINE) 10 MG tablet TAKE 1 TABLET BY MOUTH TWICE DAILY Qty: 60 tablet, Refills: 3    nitroGLYCERIN (NITROSTAT) 0.4 MG SL tablet Place 1 tablet (0.4 mg total) under the tongue every 5 (five) minutes x 3 doses as needed for chest pain. Qty: 25 tablet, Refills: 12    ranitidine (ZANTAC) 150 MG tablet TAKE 1 TABLET BY MOUTH TWICE DAILY Qty: 60 tablet, Refills: 5    rosuvastatin (CRESTOR) 40 MG tablet Take 40 mg by mouth daily.    SENEXON-S 8.6-50 MG tablet TAKE 1 TABLET BY MOUTH DAILY. Qty: 100 tablet, Refills: 1    zolpidem (AMBIEN CR) 12.5 MG CR tablet TAKE 1 TABLET BY MOUTH AT BEDTIME Qty: 30 tablet, Refills: 2    acetaminophen (TYLENOL) 325 MG tablet Take 650 mg by mouth every 6 (six) hours as needed for mild pain.    doxycycline (VIBRAMYCIN) 100 MG capsule Take 1 capsule (100 mg total) by mouth 2 (two) times daily. Qty: 14 capsule, Refills: 0   Associated Diagnoses: Open wound of left foot, subsequent encounter    NONFORMULARY OR COMPOUNDED [place] compound:  Onychomycosis Nail lacquer - Fluconazole 2 %, Terbinafine 1%, DMSO, apply to affected area daily. Qty: 120 each, Refills: 2    Vitamin D, Ergocalciferol, (DRISDOL) 50000 units CAPS capsule Take 1 capsule (50,000 Units total) by mouth every 7 (seven) days. Qty: 6 capsule, Refills: 0      STOP taking these medications     lisinopril (PRINIVIL,ZESTRIL) 10 MG tablet      atorvastatin (LIPITOR) 40 MG tablet          Aspirin prescribed at discharge?  Yes High Intensity Statin Prescribed? (Lipitor 40-31m or Crestor 20-461m: Yes Beta Blocker Prescribed? Yes For EF <40%, was ACEI/ARB Prescribed? Yes ADP Receptor Inhibitor Prescribed? (i.e. Plavix etc.-Includes Medically Managed Patients): Yes For EF <40%, Aldosterone Inhibitor Prescribed? Yes Was EF assessed during THIS hospitalization? No:  Was Cardiac Rehab II ordered? (Included Medically managed Patients): no      Outstanding Labs/Studies    Duration of Discharge Encounter   Greater than 30 minutes including physician time.  Signed, BrLyda JesterA-C 02/13/2016, 10:17 AM

## 2016-02-13 NOTE — Discharge Instructions (Signed)
You can resume use of your insulin pump tonight, 02/13/16, at 7pm.

## 2016-02-13 NOTE — Progress Notes (Signed)
Pt states pump out of insulin per order to refill with novolog; pt refuses novolog and will take SSI AC and HS until d/c

## 2016-02-15 ENCOUNTER — Telehealth: Payer: Self-pay | Admitting: Cardiology

## 2016-02-15 ENCOUNTER — Telehealth: Payer: Self-pay

## 2016-02-15 MED FILL — FREESTYLE TEST STRIPS: 59 days supply | Qty: 200 | Fill #2

## 2016-02-15 MED FILL — LOSARTAN POTASSIUM 25 MG TA: 25 | 30 days supply | Qty: 30 | Fill #0

## 2016-02-15 NOTE — Telephone Encounter (Signed)
Called patient to schedule Hospital follow up visit. Left message for return call.

## 2016-02-15 NOTE — Telephone Encounter (Signed)
OK with rehab as outlined Timothy MillersBrian Alvarez

## 2016-02-15 NOTE — Telephone Encounter (Signed)
Pre-visit call completed with  patient. 

## 2016-02-15 NOTE — Telephone Encounter (Signed)
New message      Pt is calling to see if he can have an order for cardiac rehab at home by [facility] or [facility] home agency.  He has a leg in a cast and cannot attend regular cardiac rehab

## 2016-02-15 NOTE — Telephone Encounter (Signed)
TCM completed on patient.

## 2016-02-15 NOTE — Telephone Encounter (Signed)
Left message for pt, okay for rehab at home. He will contact me with the agency his insurance likes and we can order.

## 2016-02-17 ENCOUNTER — Encounter: Payer: Self-pay | Admitting: Medical

## 2016-02-17 ENCOUNTER — Ambulatory Visit (INDEPENDENT_AMBULATORY_CARE_PROVIDER_SITE_OTHER): Payer: Medicare Other | Admitting: Medical

## 2016-02-17 VITALS — BP 128/80 | HR 81 | Temp 98.6°F | Ht 66.0 in | Wt 185.0 lb

## 2016-02-17 DIAGNOSIS — E559 Vitamin D deficiency, unspecified: Secondary | ICD-10-CM | POA: Diagnosis not present

## 2016-02-17 DIAGNOSIS — I252 Old myocardial infarction: Secondary | ICD-10-CM | POA: Diagnosis not present

## 2016-02-17 DIAGNOSIS — E1022 Type 1 diabetes mellitus with diabetic chronic kidney disease: Secondary | ICD-10-CM | POA: Diagnosis not present

## 2016-02-17 DIAGNOSIS — R7989 Other specified abnormal findings of blood chemistry: Secondary | ICD-10-CM | POA: Diagnosis not present

## 2016-02-17 DIAGNOSIS — Z8709 Personal history of other diseases of the respiratory system: Secondary | ICD-10-CM

## 2016-02-17 DIAGNOSIS — Z87898 Personal history of other specified conditions: Secondary | ICD-10-CM

## 2016-02-17 DIAGNOSIS — N189 Chronic kidney disease, unspecified: Secondary | ICD-10-CM

## 2016-02-17 LAB — COMPREHENSIVE METABOLIC PANEL
ALK PHOS: 173 U/L — AB (ref 39–117)
ALT: 67 U/L — ABNORMAL HIGH (ref 0–53)
AST: 59 U/L — ABNORMAL HIGH (ref 0–37)
Albumin: 3.7 g/dL (ref 3.5–5.2)
BILIRUBIN TOTAL: 0.6 mg/dL (ref 0.2–1.2)
BUN: 13 mg/dL (ref 6–23)
CALCIUM: 8.7 mg/dL (ref 8.4–10.5)
CO2: 29 mEq/L (ref 19–32)
Chloride: 105 mEq/L (ref 96–112)
Creatinine, Ser: 1.12 mg/dL (ref 0.40–1.50)
GFR: 70.52 mL/min (ref >60.00)
GLUCOSE: 145 mg/dL — AB (ref 70–99)
POTASSIUM: 3.7 meq/L (ref 3.5–5.1)
Sodium: 142 mEq/L (ref 135–145)
TOTAL PROTEIN: 6.9 g/dL (ref 6.0–8.3)

## 2016-02-17 LAB — VITAMIN D 25 HYDROXY (VIT D DEFICIENCY, FRACTURES): VITD: 63.41 ng/mL (ref 30.00–100.00)

## 2016-02-17 LAB — BRAIN NATRIURETIC PEPTIDE: PRO B NATRI PEPTIDE: 695 pg/mL — AB (ref 0.0–100.0)

## 2016-02-17 LAB — TROPONIN I: TNIDX: 0.06 ug/L (ref 0.00–0.06)

## 2016-02-17 MED FILL — raNITIdine HCL 150 MG TABS: 150 | 30 days supply | Qty: 60 | Fill #2

## 2016-02-17 MED FILL — FUROSEMIDE 20 MG TABLET: 20 | 30 days supply | Qty: 60 | Fill #1

## 2016-02-17 NOTE — Patient Instructions (Signed)
You appear stable presently. I want to repeat cxr, bnp, cmp, and will get vitamin d level today.  With your recent history I want you to watch yourself closely if any chest pain or sob as before then ED evaluation.  I will refer you to Dr. Jens Somrenshaw.  You report sugars are well controlled now. If they get worse again let us know.  Keep your appointment with me in one week. May cancel that appointment depends on lab work results.

## 2016-02-17 NOTE — Telephone Encounter (Signed)
Will you see referral to Dr. Jens Somrenshaw.

## 2016-02-17 NOTE — Telephone Encounter (Signed)
Pt is calling to check the status of Cardiac Rehab

## 2016-02-17 NOTE — Telephone Encounter (Signed)
Referral sent to [facility], awaiting appt °

## 2016-02-17 NOTE — Progress Notes (Signed)
Subjective:    Patient ID: Timothy Alvarez, male    DOB: 1955-11-01, 60 y.o.   MRN: [id number]  HPI  Pt in for follow up. Pt is not reporting any chest pain and no shortness of breath.  Pt states his lungs feel little moist. Pt states at rest occasional when reclined will hear faint tansient  wheeze/ rough sound. Occasional cough. No fever, no chills or sweats.  Pt has called cardiologist and pt has called home health. Pt is being set up for cardia rehab.  Pt does not have appointment with Dr. Stanford Breed.   I had sent him to ED due to elevated dimer. Later his troponin was elevated and he had work up that was extensive. Pt bnp was elevated as well.  Appears pt last troponin was high. But still discharge.  On review pt was given lasix in the hospital.  Pt has known severe blockage of LAD. He is electing medical management and no procedure.    Review of Systems  Constitutional: Negative for chills and fatigue.  HENT: Negative for congestion, sinus pain and sinus pressure.   Respiratory: Positive for cough and wheezing. Negative for choking and chest tightness.        Very rare both on lying supine and last seconds/occasional.  Cardiovascular: Negative for chest pain, palpitations and leg swelling.  Gastrointestinal: Negative for abdominal pain.  Musculoskeletal: Negative for back pain, myalgias and neck pain.  Skin: Negative for rash.  Neurological: Negative for dizziness, speech difficulty, numbness and headaches.  Hematological: Negative for adenopathy. Does not bruise/bleed easily.  Psychiatric/Behavioral: Negative for behavioral problems and confusion.    Past Medical History:  Diagnosis Date  . CAD (coronary artery disease)   . Cataracts, bilateral   . Chicken pox   . Contrast dye induced nephropathy   . Diabetes type 1, uncontrolled ([place])   . Diabetic retinopathy ([place])   . Frequent headaches   . Heart attack 09.13.2014   x5  . Hyperlipidemia   . Measles, [place]  (rubella)   . Mumps   . Orthostatic hypotension   . OSA (obstructive sleep apnea) 07/17/2013  . Scarlet fever      Social History   Social History  . Marital status: Married    Spouse name: N/A  . Number of children: 1  . Years of education: N/A   Occupational History  . Not on file.   Social History Main Topics  . Smoking status: Never Smoker  . Smokeless tobacco: Never Used  . Alcohol use Yes     Comment: Rare  . Drug use: No  . Sexual activity: Not on file   Other Topics Concern  . 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  They have one grown daughter.   Highest level of education:  Master's Degree    Past Surgical History:  Procedure Laterality Date  . APPENDECTOMY  1966  . CARDIAC CATHETERIZATION N/A 03/18/2015   Procedure: Left Heart Cath and Coronary Angiography;  Surgeon: Belva Crome, MD;  Location: [facility];  Service: Cardiovascular;  Laterality: N/A;  . CORONARY ANGIOPLASTY WITH STENT PLACEMENT    . REFRACTIVE SURGERY     Retinopathy  . WISDOM TOOTH EXTRACTION      Family History  Problem Relation Age of Onset  . Hypertension Mother 75    Deceased  . Other Mother  s/p PPM  . Vascular Disease Father 83    Deceased  . Transient ischemic attack Father   . Liver cancer Paternal Grandfather   . Breast cancer Sister   . Heart disease Sister     Ablation for rhythm disturbance  . Healthy Brother     x1    Allergies  Allergen Reactions  . Contrast Media [Iodinated Diagnostic Agents] Other (See Comments)    Renal Failure.   . Lisinopril Cough    Current Outpatient Prescriptions on File Prior to Visit  Medication Sig Dispense Refill  . acetaminophen (TYLENOL) 325 MG tablet Take 650 mg by mouth every 6 (six) hours as needed for mild pain.    Marland Kitchen aspirin 81 MG tablet Take 81 mg by mouth daily.    . clopidogrel (PLAVIX) 75 MG tablet Take 1 tablet (75 mg total) by mouth  daily. 90 tablet 3  . co-enzyme Q-10 30 MG capsule Take 30 mg by mouth daily.     . fludrocortisone (FLORINEF) 0.1 MG tablet TAKE 1 TABLET BY MOUTH TWICE DAILY 60 tablet 3  . furosemide (LASIX) 20 MG tablet Take 1 tablet (20 mg total) by mouth 2 (two) times daily. 60 tablet 6  . gabapentin (NEURONTIN) 600 MG tablet TAKE 1 TABLET BY MOUTH 3 TIMES DAILY. 90 tablet 2  . glucagon 1 MG injection Inject 1 mg into the vein once as needed (Emergency Test Kit). Reported on 04/07/2015    . HYDROcodone-acetaminophen (NORCO/VICODIN) 5-325 MG tablet Take 1 tablet by mouth every 6 (six) hours as needed for severe pain. 60 tablet 0  . insulin lispro (HUMALOG) 100 UNIT/ML injection Inject 1-40 Units into the skin as directed. Use in Insulin Pump, Max daily dose: 100 units/day    . isosorbide mononitrate (IMDUR) 60 MG 24 hr tablet Take 1 tablet (60 mg total) by mouth daily. 30 tablet 11  . losartan (COZAAR) 25 MG tablet Take 1 tablet (25 mg total) by mouth daily. 30 tablet 5  . meclizine (ANTIVERT) 12.5 MG tablet Take 1 tablet (12.5 mg total) by mouth 3 (three) times daily as needed for dizziness. 30 tablet 0  . midodrine (PROAMATINE) 10 MG tablet TAKE 1 TABLET BY MOUTH TWICE DAILY 60 tablet 3  . nitroGLYCERIN (NITROSTAT) 0.4 MG SL tablet Place 1 tablet (0.4 mg total) under the tongue every 5 (five) minutes x 3 doses as needed for chest pain. 25 tablet 12  . NONFORMULARY OR COMPOUNDED [place] compound:  Onychomycosis Nail lacquer - Fluconazole 2 %, Terbinafine 1%, DMSO, apply to affected area daily. 120 each 2  . ranitidine (ZANTAC) 150 MG tablet TAKE 1 TABLET BY MOUTH TWICE DAILY 60 tablet 5  . rosuvastatin (CRESTOR) 40 MG tablet Take 40 mg by mouth daily.    . SENEXON-S 8.6-50 MG tablet TAKE 1 TABLET BY MOUTH DAILY. 100 tablet 1  . zolpidem (AMBIEN CR) 12.5 MG CR tablet TAKE 1 TABLET BY MOUTH AT BEDTIME 30 tablet 2   No current facility-administered medications on file prior to visit.     BP  128/80 (BP Location: Left Arm, Patient Position: Sitting, Cuff Size: Normal)   Pulse 81   Temp 98.6 F (37 C) (Oral)   Ht _0  (1.676 m)   Wt 185 lb (83.9 kg)   SpO2 98%   BMI 29.86 kg/m       Objective:   Physical Exam  General Mental Status- Alert. General Appearance- Not in acute distress.   Skin General:  Color- Normal Color. Moisture- Normal Moisture.  Neck Carotid Arteries- Normal color. Moisture- Normal Moisture. No carotid bruits. No JVD.  Chest and Lung Exam Auscultation: Breath Sounds:-Normal.  Cardiovascular Auscultation:Rythm- Regular. Murmurs & Other Heart Sounds:Auscultation of the heart reveals- No Murmurs.  Abdomen Inspection:-Inspeection Normal. Palpation/Percussion:Note:No mass. Palpation and Percussion of the abdomen reveal- Non Tender, Non Distended + BS, no rebound or guarding.    Neurologic Cranial Nerve exam:- CN III-XII intact(No nystagmus), symmetric smile. .Strength:- 5/5 equal and symmetric strength both upper and lower extremities.  Lower ext- negative homans sign. No pedal edema.       Assessment & Plan:  You appear stable presently. I want to repeat cxr, bnp, cmp, and will get vitamin d level today.  With your recent history I want you to watch yourself closely if any chest pain or sob as before then ED evaluation.  I will refer you to Dr. Stanford Breed.  You report sugars are well controlled now. If they get worse again let us know.  Keep your appointment with me in one week. May cancel that appointment depends on lab work results.  [phone].

## 2016-02-18 ENCOUNTER — Ambulatory Visit (HOSPITAL_BASED_OUTPATIENT_CLINIC_OR_DEPARTMENT_OTHER)
Admission: RE | Admit: 2016-02-18 | Discharge: 2016-02-18 | Disposition: A | Discharge status: Home / Self Care | Payer: Medicare Other | Source: Ambulatory Visit | Attending: Medical | Admitting: Medical

## 2016-02-18 DIAGNOSIS — R0600 Dyspnea, unspecified: Secondary | ICD-10-CM | POA: Insufficient documentation

## 2016-02-18 DIAGNOSIS — R7989 Other specified abnormal findings of blood chemistry: Secondary | ICD-10-CM | POA: Insufficient documentation

## 2016-02-18 DIAGNOSIS — Z87898 Personal history of other specified conditions: Secondary | ICD-10-CM

## 2016-02-18 DIAGNOSIS — Z8709 Personal history of other diseases of the respiratory system: Secondary | ICD-10-CM

## 2016-02-18 NOTE — Telephone Encounter (Signed)
Spoke with pt states that he was confused and called insurance and then called some home care aganecy and they hung up on him and called him back...etc so pt was confused. He states that he doesn't remember who it was but will find out again and I told him just to call us back with agency that his insurance recommends

## 2016-02-19 ENCOUNTER — Other Ambulatory Visit: Payer: Medicare Other

## 2016-02-22 ENCOUNTER — Telehealth: Payer: Self-pay | Admitting: Medical

## 2016-02-22 NOTE — Telephone Encounter (Signed)
Caller name: Relationship to patient: Self Can be reached: [phone] Pharmacy:  Reason for call: [facility] and to find out if he needs to see provider on Wednesday.

## 2016-02-22 NOTE — Telephone Encounter (Signed)
Pt does not need to follow up on this Wednesday since he reports feeling better.  Please call pt and notify him. But do follow up early January.

## 2016-02-22 NOTE — Telephone Encounter (Signed)
Patient states that he is feeling better and he only has some shortness of breath when he walks up a flight of stairs. He states other than that he is feeling better and he does not want to have to come in on Wednesday if there is no need to follow up.  Please advise.

## 2016-02-23 ENCOUNTER — Ambulatory Visit (HOSPITAL_BASED_OUTPATIENT_CLINIC_OR_DEPARTMENT_OTHER)
Admission: RE | Admit: 2016-02-23 | Discharge: 2016-02-23 | Disposition: A | Discharge status: Home / Self Care | Payer: Medicare Other | Source: Ambulatory Visit | Attending: Podiatry | Admitting: Podiatry

## 2016-02-23 ENCOUNTER — Encounter: Payer: Self-pay | Admitting: Podiatry

## 2016-02-23 ENCOUNTER — Telehealth: Payer: Self-pay | Admitting: *Deleted

## 2016-02-23 ENCOUNTER — Ambulatory Visit (INDEPENDENT_AMBULATORY_CARE_PROVIDER_SITE_OTHER): Payer: Medicare Other | Admitting: Podiatry

## 2016-02-23 ENCOUNTER — Telehealth: Payer: Self-pay | Admitting: Cardiology

## 2016-02-23 DIAGNOSIS — S93324D Dislocation of tarsometatarsal joint of right foot, subsequent encounter: Secondary | ICD-10-CM

## 2016-02-23 DIAGNOSIS — M14671 Charcot's joint, right ankle and foot: Secondary | ICD-10-CM

## 2016-02-23 DIAGNOSIS — S92301A Fracture of unspecified metatarsal bone(s), right foot, initial encounter for closed fracture: Secondary | ICD-10-CM | POA: Insufficient documentation

## 2016-02-23 DIAGNOSIS — X58XXXA Exposure to other specified factors, initial encounter: Secondary | ICD-10-CM | POA: Insufficient documentation

## 2016-02-23 NOTE — Telephone Encounter (Signed)
Per last messge this is for cardiac rehab

## 2016-02-23 NOTE — Telephone Encounter (Signed)
New message  Pt is needing a letter of referral to a home care group  [facility], ph: [phone], fax: [phone]  Please follow up

## 2016-02-23 NOTE — Progress Notes (Signed)
Subjective: 60 year old male presents the office in follow-up evaluation of right foot fractures, Charcot. He states these been having some pain to the right foot at the end of the day and taking pain medicine for this. He does continue with a Cam Walker and presents in a surgical shoe today. He states that he does take his dogs out to the bathroom and he comes back in and has at the extent of his walking. He is continued has swelling to the foot.  Unfortunately he was recently hospitalized last week for a heart attack.  Denies any systemic complaints such as fevers, chills, nausea, vomiting. No acute changes since last appointment, and no other complaints at this time.   Objective: AAO x3, NAD; presents today wearing a flat regular shoe. DP/PT pulses palpable bilaterally, CRT less than 3 seconds Protective sensation decreased with Simms Weinstein monofilament The right foot does appear to be wider than the left foot. There is continued edema to the right foot there is no erythema or increase in warmth. There is no area of tenderness however he does have significant neuropathy. There is no rocker bottom deformity or any other gross deformity to the right foot. No open lesions or pre-ulcerative lesions were identified today.  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 Fracture, subluxation of Lisfranc joint likely result of Charcot. -At this time ordered a CT scan of the right foot. -I again today stressed nonweightbearing in a cast for mobilization however he declined this. I discussed with him risks and complications of not having the cast. He understands this. We will do a cam boot. He hater to stay nonwbearing. I  discussed with him sequela Charcot including ulceration, foot deformity, loss of leg. Discussed possible surgical intervention however given his medical conditions we will continue conservative treatment for now and the patient agreement to this and let hold off on any surgical intervention as well. -Follow-up in 3 weeks or sooner if any problems arise. In the meantime, encouraged to call the office with any questions, concerns, change in symptoms.   Ovid CurdMatthew Wagoner, DPM

## 2016-02-23 NOTE — Telephone Encounter (Signed)
I spoke with the patient and advised him that he would not need the follow up with Ramon DredgeEdward on 02/24/16 and he would need a follow up at the end of January. The patient voices understanding.

## 2016-02-23 NOTE — Telephone Encounter (Addendum)
-----   Message from Vivi BarrackMatthew R Wagoner, DPM sent at 02/23/2016 10:50 AM EST ----- Can you order a CT scan of the Right foot due to Charcot- [facility] be [facility]? [facility] main scheduling. 02/26/2016-Pt request knee scooter.05/31/2016-Pt request x-ray of both feet today. I put in orders for B/L foot x-rays and informed pt and Dr. Ardelle AntonWagoner of pt's request.

## 2016-02-23 NOTE — Telephone Encounter (Deleted)
-----   Message from Vivi BarrackMatthew R Wagoner, DPM sent at 02/23/2016 10:50 AM EST ----- Can you order a CT scan of the Right foot due to Charcot- can this be done at [facility]?

## 2016-02-24 ENCOUNTER — Ambulatory Visit: Payer: Medicare Other | Admitting: Cardiology

## 2016-02-24 ENCOUNTER — Ambulatory Visit: Payer: Medicare Other | Admitting: Medical

## 2016-02-24 ENCOUNTER — Other Ambulatory Visit: Payer: Self-pay | Admitting: *Deleted

## 2016-02-24 DIAGNOSIS — I2510 Atherosclerotic heart disease of native coronary artery without angina pectoris: Secondary | ICD-10-CM

## 2016-02-24 MED FILL — COMBIGAN EYE DROPS: 0.2-0.5 | 25 days supply | Qty: 5 | Fill #0

## 2016-02-24 MED FILL — LATANOPROST 0.005% EYE DRP: 0.005 | 25 days supply | Qty: 3 | Fill #0

## 2016-02-24 MED FILL — AZOPT 1% EYE DROPS: 1 | 60 days supply | Qty: 10 | Fill #0

## 2016-02-25 MED FILL — HumaLOG 100 UNIT/ML SOLN: 100 | 30 days supply | Qty: 20 | Fill #2

## 2016-02-26 ENCOUNTER — Encounter: Payer: Self-pay | Admitting: Podiatry

## 2016-02-26 NOTE — Telephone Encounter (Signed)
PT order, last office visit and demographics [facility] number [facility].

## 2016-02-29 ENCOUNTER — Telehealth: Payer: Self-pay | Admitting: Medical

## 2016-02-29 MED FILL — FLUDROCORTISONE 0.1 MG TAB: 0.1 | 30 days supply | Qty: 60 | Fill #1

## 2016-02-29 MED FILL — GABAPENTIN 600 MG TABLET: 600 | 30 days supply | Qty: 90 | Fill #2

## 2016-02-29 NOTE — Telephone Encounter (Signed)
Let [facility] know ok order/verbal for PT and OT as they requested

## 2016-02-29 NOTE — Telephone Encounter (Signed)
Christine with [facility] called to request verbal orders for PT for 1x per week for 3 weeks and also a request for an OT evaluation. Please advise   Wynona CanesChristine phone: [phone]

## 2016-02-29 NOTE — Telephone Encounter (Signed)
Please advise 

## 2016-03-01 NOTE — Telephone Encounter (Signed)
Christine returned call.  Per Esperanza RichtersEdward Saguier, verbal order given for PT/OT as requested.

## 2016-03-01 NOTE — Telephone Encounter (Signed)
Called and left a message for call back  

## 2016-03-03 ENCOUNTER — Telehealth: Payer: Self-pay | Admitting: Medical

## 2016-03-03 ENCOUNTER — Encounter: Payer: Self-pay | Admitting: Medical

## 2016-03-03 ENCOUNTER — Ambulatory Visit (HOSPITAL_BASED_OUTPATIENT_CLINIC_OR_DEPARTMENT_OTHER): Admission: RE | Admit: 2016-03-03 | Payer: Medicare Other | Source: Ambulatory Visit

## 2016-03-03 ENCOUNTER — Telehealth: Payer: Self-pay

## 2016-03-03 MED ORDER — ZOLPIDEM TARTRATE ER 12.5 MG PO TBCR: 12.5000 mg | EXTENDED_RELEASE_TABLET | Freq: Every day | ORAL | 2 refills | Status: DC

## 2016-03-03 MED FILL — ZOLPIDEM TART ER 12.5 MG TA: 12.5 | 30 days supply | Qty: 30 | Fill #0

## 2016-03-03 NOTE — Telephone Encounter (Signed)
Pt came to pick up Rx. Pt received Rx Ambien from provider. LB

## 2016-03-03 NOTE — Telephone Encounter (Signed)
Caller name: Relationship to patient: Self Can be reached: [phone]  Pharmacy:  Reason for call: request refill on zolpidem (AMBIEN CR) 12.5 MG CR tablet [[id number] Patient states he has been trying to get a refill all week through the pharmacy and they keep telling him that the provider has not responded. Patient request to pick up hard copy by 1:30 today.

## 2016-03-03 NOTE — Telephone Encounter (Signed)
Last seen 02/17/16 Last filled 11/19/15 #30 2 rf Sig take 1 tab po at bedtime  Please advise PC

## 2016-03-03 NOTE — Telephone Encounter (Signed)
ok'd and printed ambien rx. Pt picked up the rx. Pt due for the med.

## 2016-03-04 ENCOUNTER — Other Ambulatory Visit: Payer: Self-pay | Admitting: Physician Assistant

## 2016-03-04 MED FILL — ISOSORBIDE MN ER 60 MG TAB: 60 | 30 days supply | Qty: 30 | Fill #0

## 2016-03-04 MED FILL — MIDODRINE HCL 10 MG TABLET: 10 | 30 days supply | Qty: 60 | Fill #1

## 2016-03-08 ENCOUNTER — Telehealth: Payer: Self-pay | Admitting: Medical

## 2016-03-08 NOTE — Telephone Encounter (Signed)
[facility] Nurse Mellody DrownJavelosa [phone]- [phone]   Called In to make provider aware that pt had a fall last Wednesday and hurt his left shoulder. She says that she went out to home today and pt says that shoulder is feeling better. No major injuries.

## 2016-03-09 NOTE — Telephone Encounter (Signed)
FYI

## 2016-03-10 ENCOUNTER — Emergency Department (HOSPITAL_BASED_OUTPATIENT_CLINIC_OR_DEPARTMENT_OTHER): Payer: Medicare Other

## 2016-03-10 ENCOUNTER — Encounter (HOSPITAL_BASED_OUTPATIENT_CLINIC_OR_DEPARTMENT_OTHER): Payer: Self-pay | Admitting: *Deleted

## 2016-03-10 ENCOUNTER — Emergency Department (HOSPITAL_BASED_OUTPATIENT_CLINIC_OR_DEPARTMENT_OTHER)
Admission: EM | Admit: 2016-03-10 | Discharge: 2016-03-10 | Disposition: A | Discharge status: Home / Self Care | Payer: Medicare Other | Attending: Emergency Medicine | Admitting: Emergency Medicine

## 2016-03-10 ENCOUNTER — Telehealth: Payer: Self-pay | Admitting: Medical

## 2016-03-10 DIAGNOSIS — I2510 Atherosclerotic heart disease of native coronary artery without angina pectoris: Secondary | ICD-10-CM | POA: Diagnosis not present

## 2016-03-10 DIAGNOSIS — R062 Wheezing: Secondary | ICD-10-CM | POA: Insufficient documentation

## 2016-03-10 DIAGNOSIS — I509 Heart failure, unspecified: Secondary | ICD-10-CM | POA: Insufficient documentation

## 2016-03-10 DIAGNOSIS — E119 Type 2 diabetes mellitus without complications: Secondary | ICD-10-CM | POA: Diagnosis not present

## 2016-03-10 DIAGNOSIS — R0602 Shortness of breath: Secondary | ICD-10-CM | POA: Diagnosis not present

## 2016-03-10 DIAGNOSIS — Z79899 Other long term (current) drug therapy: Secondary | ICD-10-CM | POA: Insufficient documentation

## 2016-03-10 DIAGNOSIS — Z7982 Long term (current) use of aspirin: Secondary | ICD-10-CM | POA: Diagnosis not present

## 2016-03-10 DIAGNOSIS — R600 Localized edema: Secondary | ICD-10-CM | POA: Diagnosis not present

## 2016-03-10 DIAGNOSIS — Z794 Long term (current) use of insulin: Secondary | ICD-10-CM | POA: Diagnosis not present

## 2016-03-10 DIAGNOSIS — I110 Hypertensive heart disease with heart failure: Secondary | ICD-10-CM | POA: Insufficient documentation

## 2016-03-10 DIAGNOSIS — R635 Abnormal weight gain: Secondary | ICD-10-CM | POA: Diagnosis not present

## 2016-03-10 DIAGNOSIS — R0600 Dyspnea, unspecified: Secondary | ICD-10-CM

## 2016-03-10 HISTORY — DX: Heart failure, unspecified: I50.9

## 2016-03-10 LAB — BASIC METABOLIC PANEL
Anion gap: 8 (ref 5–15)
BUN: 19 mg/dL (ref 6–20)
CO2: 28 mmol/L (ref 22–32)
Calcium: 8.7 mg/dL — ABNORMAL LOW (ref 8.9–10.3)
Chloride: 105 mmol/L (ref 101–111)
Creatinine, Ser: 1.15 mg/dL (ref 0.61–1.24)
GFR calc Af Amer: >60 mL/min (ref >60)
GFR calc non Af Amer: >60 mL/min (ref >60)
Glucose, Bld: 158 mg/dL — ABNORMAL HIGH (ref 65–99)
Potassium: 3.6 mmol/L (ref 3.5–5.1)
Sodium: 141 mmol/L (ref 135–145)

## 2016-03-10 LAB — CBC WITH DIFFERENTIAL/PLATELET
Basophils Absolute: 0 10*3/uL (ref 0.0–0.1)
Basophils Relative: 1 %
Eosinophils Absolute: 0.3 10*3/uL (ref 0.0–0.7)
Eosinophils Relative: 6 %
HCT: 36.6 % — ABNORMAL LOW (ref 39.0–52.0)
Hemoglobin: 12 g/dL — ABNORMAL LOW (ref 13.0–17.0)
Lymphocytes Relative: 25 %
Lymphs Abs: 1.4 10*3/uL (ref 0.7–4.0)
MCH: 28.2 pg (ref 26.0–34.0)
MCHC: 32.8 g/dL (ref 30.0–36.0)
MCV: 86.1 fL (ref 78.0–100.0)
Monocytes Absolute: 0.7 10*3/uL (ref 0.1–1.0)
Monocytes Relative: 12 %
Neutro Abs: 3.2 10*3/uL (ref 1.7–7.7)
Neutrophils Relative %: 56 %
Platelets: 219 10*3/uL (ref 150–400)
RBC: 4.25 MIL/uL (ref 4.22–5.81)
RDW: 14.1 % (ref 11.5–15.5)
WBC: 5.7 10*3/uL (ref 4.0–10.5)

## 2016-03-10 LAB — BRAIN NATRIURETIC PEPTIDE: B Natriuretic Peptide: 269.4 pg/mL — ABNORMAL HIGH (ref 0.0–100.0)

## 2016-03-10 LAB — TROPONIN I: Troponin I: <0.03 ng/mL (ref <0.03)

## 2016-03-10 MED ORDER — POTASSIUM CHLORIDE CRYS ER 10 MEQ PO TBCR: 20.0000 meq | EXTENDED_RELEASE_TABLET | Freq: Two times a day (BID) | ORAL | 0 refills | Status: DC

## 2016-03-10 MED ORDER — FUROSEMIDE 10 MG/ML IJ SOLN
80.0000 mg | Freq: Once | INTRAMUSCULAR | Status: AC
  Administered 2016-03-10: 80 mg via INTRAVENOUS
  Filled 2016-03-10: qty 8

## 2016-03-10 MED ORDER — POTASSIUM CHLORIDE CRYS ER 20 MEQ PO TBCR
60.0000 meq | EXTENDED_RELEASE_TABLET | Freq: Once | ORAL | Status: AC
  Administered 2016-03-10: 60 meq via ORAL
  Filled 2016-03-10: qty 3

## 2016-03-10 MED ORDER — FUROSEMIDE 40 MG PO TABS: 40.0000 mg | ORAL_TABLET | Freq: Two times a day (BID) | ORAL | 0 refills | Status: DC

## 2016-03-10 MED FILL — POTASSIUM CL 10 MEQ TAB SA: 10 | 3 days supply | Qty: 10 | Fill #0

## 2016-03-10 MED FILL — FUROSEMIDE 40 MG TABLET: 40 | 5 days supply | Qty: 10 | Fill #0

## 2016-03-10 NOTE — Telephone Encounter (Signed)
Per the patient's chart, he's currently being seen at the ED.

## 2016-03-10 NOTE — Telephone Encounter (Signed)
Follow up call made to see if patint had gone to ED. No answer,left message.

## 2016-03-10 NOTE — Telephone Encounter (Signed)
Patient Name: Timothy Alvarez  DOB: September 08, 1955    Initial Comment husband has had 5 lbs weight gain of more than 3lbs per 24 hrs, diabetic, shortness of breathing when he tries to move around, some swelling lower exterminates, type 1 diabetic    Nurse Assessment  Nurse: Stefano GaulStringer, RN, Dwana CurdVera Date/Time (Eastern Time): 03/10/2016 8:23:59 AM  Confirm and document reason for call. If symptomatic, describe symptoms. ---Caller states spouse has had 5 lbs weight gain in the past 24 hrs. Has SOB with exertion. No fever. No SOB at rest. He is type 1 diabetic. Blood sugar 162. Has swelling from knees down in both legs.  Does the patient have any new or worsening symptoms? ---Yes  Will a triage be completed? ---Yes  Related visit to physician within the last 2 weeks? ---No  Does the PT have any chronic conditions? (i.e. diabetes, asthma, etc.) ---Yes  List chronic conditions. ---heart disease; type 1 diabetes; CAD; CHF  Is this a behavioral health or substance abuse call? ---No     Guidelines    Guideline Title Affirmed Question Affirmed Notes  Leg Swelling and Edema [1] Difficulty breathing with exertion (e.g., walking) AND [2] new onset or worsening    Final Disposition User   Go to ED Now (or PCP triage) Stefano GaulStringer, RN, Vera    Comments  [facility] within 4 hrs. Spouse states she [facility]   Referrals  GO TO FACILITY OTHER - SPECIFY   Disagree/Comply: Comply

## 2016-03-10 NOTE — ED Triage Notes (Signed)
C/o fluid in lungs x 3 months. States he feels more short of breath this am. Has swelling in lower extremeties. Denies pain.

## 2016-03-10 NOTE — ED Provider Notes (Signed)
[facility] Provider Note   CSN: [id number] Arrival date & time: 03/10/16  0865  By signing my name below, I, Reola Mosher, attest that this documentation has been prepared under the direction and in the presence of Virgel Manifold, MD. Electronically Signed: Reola Mosher, ED Scribe. 03/10/16. 11:06 AM.  History   Chief Complaint Chief Complaint  Patient presents with  . Shortness of Breath   The history is provided by the patient. No language interpreter was used.    HPI Comments: Timothy Alvarez is a 60 y.o. male with a PMHx of CHF with an EF of 60%, CAD s/p stent placement, and DM with renal manifestations, who presents to the Emergency Department complaining of persistent shortness of breath onset several days ago, worsening since this morning. Pt notes that he has shortness of breath towards his baseline, however, over the past several days this has been an increased issue and has began to worsen this morning. He typically sleeps sitting up at night, however, this morning he slipped into a flat position while resting. He states during this he became increasingly orthopneic, and has had increasing shortness of breath and wheezing since. Pt describes this as feeling like "there is fluid on my lungs". His wife also notes that he has had DOE this morning as well, which the pt confirms saying that he was unable to even reach over to lace up his boot without having to pause to catch his breath. Pt also states that he has gained approximately 3-5lbs over the past day as well, and notes that he has had mild bilateral leg swelling which his wife confirms. He is currently prescribed '40mg'$  Lasix daily, with his last dose being this morning. Per prior chart review, pt was recently seen in the ED on 02/10/16 (~1 month ago), and at that time he was subsequently admitted to Cardiology. He was ruled in for a small NSTEMI during this admission. Pt was offered cardiac catheterization at  that time; however, pt opted for continued medical management at home. His last cardiac catheterization was in January 2017 (~1 year ago). Pt is currently immobilized in the right leg due to multiple fractures of the right foot and in the setting of Charcot syndrome. No Hx of PE/DVT. During his last admission he was found to have an elevated d-dimer, however, had a negative VQ scan performed at that time. He denies chest pain, diaphoresis, or any other associated symptoms.   PCP: Mackie Pai, PA-C  Cardiologist: Kirk Ruths, MD  Past Medical History:  Diagnosis Date  . CAD (coronary artery disease)   . Cataracts, bilateral   . CHF (congestive heart failure) ([place])   . Chicken pox   . Contrast dye induced nephropathy   . Diabetes type 1, uncontrolled ([place])   . Diabetic retinopathy ([place])   . Frequent headaches   . Heart attack 09.13.2014   x5  . Hyperlipidemia   . Measles, [place] (rubella)   . Mumps   . Orthostatic hypotension   . OSA (obstructive sleep apnea) 07/17/2013  . Scarlet fever    Patient Active Problem List   Diagnosis Date Noted  . Essential hypertension 02/13/2016  . Foot ulcer, left ([place]) 11/06/2015  . Disability examination 07/26/2015  . Edema 07/01/2015  . Hematuria 04/30/2015  . Gastroesophageal reflux disease without esophagitis 04/22/2015  . NSTEMI (non-ST elevated myocardial infarction) ([place]) 03/18/2015  . Severe comorbid illness   . SOB (shortness of breath) 08/12/2014  . Fatigue 02/03/2014  .  OSA (obstructive sleep apnea) 07/17/2013  . Bruit 05/08/2013  . 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 Chronic pain syndrome 04/07/2013  . CAD S/P percutaneous coronary angioplasty 04/07/2013  . Neuropathy ([place]) 04/07/2013  . Orthostatic hypotension 04/07/2013  . Type 2 diabetes mellitus with renal manifestations ([place]) 04/07/2013  . Depression 04/07/2013  . Insomnia 04/07/2013  . Hyperlipidemia 04/07/2013  . Encounter to establish care 04/07/2013    Past Surgical History:  Procedure Laterality Date  . APPENDECTOMY  1966  . CARDIAC CATHETERIZATION N/A 03/18/2015   Procedure: Left Heart Cath and Coronary Angiography;  Surgeon: Belva Crome, MD;  Location: [facility];  Service: Cardiovascular;  Laterality: N/A;  . CORONARY ANGIOPLASTY WITH STENT PLACEMENT    . REFRACTIVE SURGERY     Retinopathy  . WISDOM TOOTH EXTRACTION      Home Medications    Prior to Admission medications   Medication Sig Start Date End Date Taking? Authorizing Provider  acetaminophen (TYLENOL) 325 MG tablet Take 650 mg by mouth every 6 (six) hours as needed for mild pain.   Yes Historical Provider, MD  aspirin 81 MG tablet Take 81 mg by mouth daily.   Yes Historical Provider, MD  brimonidine-timolol (COMBIGAN) 0.2-0.5 % ophthalmic solution Place 1 drop into the right eye every 12 (twelve) hours.   Yes Historical Provider, MD  brinzolamide (AZOPT) 1 % ophthalmic suspension 1 drop 3 (three) times daily.   Yes Historical Provider, MD  clopidogrel (PLAVIX) 75 MG tablet Take 1 tablet (75 mg total) by mouth daily. 03/27/15  Yes Luke K Kilroy, PA-C  co-enzyme Q-10 30 MG capsule Take 30 mg by mouth daily.    Yes Historical Provider, MD  fludrocortisone (FLORINEF) 0.1 MG tablet TAKE 1 TABLET BY MOUTH TWICE DAILY 01/28/16  Yes Brunetta Jeans, PA-C  furosemide (LASIX) 20 MG tablet Take 1 tablet (20 mg total) by mouth 2 (two) times daily. 01/20/16  Yes Lelon Perla, MD  gabapentin (NEURONTIN) 600 MG tablet TAKE 1 TABLET BY MOUTH 3 TIMES DAILY. 11/13/15  Yes Brunetta Jeans, PA-C  glucagon 1 MG injection Inject 1 mg into the vein once as needed (Emergency Test Kit). Reported on 04/07/2015   Yes Historical Provider, MD  HYDROcodone-acetaminophen (NORCO/VICODIN) 5-325 MG tablet Take 1 tablet by mouth every 6 (six) hours as needed for severe pain. 04/22/15  Yes Brunetta Jeans, PA-C  insulin lispro (HUMALOG) 100 UNIT/ML injection Inject 1-40 Units into the skin as  directed. Use in Insulin Pump, Max daily dose: 100 units/day   Yes Historical Provider, MD  isosorbide mononitrate (IMDUR) 60 MG 24 hr tablet TAKE 1 TABLET (60 MG TOTAL) BY MOUTH DAILY. 03/04/16  Yes Lelon Perla, MD  latanoprost (XALATAN) 0.005 % ophthalmic solution Place 1 drop into both eyes at bedtime.   Yes Historical Provider, MD  losartan (COZAAR) 25 MG tablet Take 1 tablet (25 mg total) by mouth daily. 02/14/16  Yes Brittainy Erie Noe, PA-C  meclizine (ANTIVERT) 12.5 MG tablet Take 1 tablet (12.5 mg total) by mouth 3 (three) times daily as needed for dizziness. 02/27/14  Yes Edward Saguier, PA-C  midodrine (PROAMATINE) 10 MG tablet TAKE 1 TABLET BY MOUTH TWICE DAILY 01/28/16  Yes Brunetta Jeans, PA-C  nitroGLYCERIN (NITROSTAT) 0.4 MG SL tablet Place 1 tablet (0.4 mg total) under the tongue every 5 (five) minutes x 3 doses as needed for chest pain. 03/20/15  Yes Leanor Kail, PA  NONFORMULARY OR COMPOUNDED Clinical biochemist  compound:  Onychomycosis Nail lacquer - Fluconazole 2 %, Terbinafine 1%, DMSO, apply to affected area daily. 09/25/15  Yes Trula Slade, DPM  ranitidine (ZANTAC) 150 MG tablet TAKE 1 TABLET BY MOUTH TWICE DAILY 12/09/15  Yes Brunetta Jeans, PA-C  rosuvastatin (CRESTOR) 40 MG tablet Take 40 mg by mouth daily. 01/28/16  Yes Historical Provider, MD  SENEXON-S 8.6-50 MG tablet TAKE 1 TABLET BY MOUTH DAILY. 02/13/15  Yes Brunetta Jeans, PA-C  zolpidem (AMBIEN CR) 12.5 MG CR tablet Take 1 tablet (12.5 mg total) by mouth at bedtime. 03/03/16  Yes Mackie Pai, PA-C   Family History Family History  Problem Relation Age of Onset  . Hypertension Mother 64    Deceased  . Other Mother     s/p PPM  . Vascular Disease Father 40    Deceased  . Transient ischemic attack Father   . Liver cancer Paternal Grandfather   . Breast cancer Sister   . Heart disease Sister     Ablation for rhythm disturbance  . Healthy Brother     x1   Social History Social  History  Substance Use Topics  . Smoking status: Never Smoker  . Smokeless tobacco: Never Used  . Alcohol use Yes     Comment: Rare   Allergies   Contrast media [iodinated diagnostic agents] and Lisinopril  Review of Systems Review of Systems  Constitutional: Positive for unexpected weight change. Negative for diaphoresis.  Respiratory: Positive for shortness of breath and wheezing.   Cardiovascular: Positive for leg swelling. Negative for chest pain.  All other systems reviewed and are negative.  Physical Exam Updated Vital Signs BP 151/80 (BP Location: Right Arm)   Pulse 75   Temp 98 F (36.7 C) (Oral)   Resp 18   Ht 5' 6"  (1.676 m)   Wt 194 lb (88 kg)   SpO2 99%   BMI 31.31 kg/m   Physical Exam  Constitutional: He appears well-developed and well-nourished.  HENT:  Head: Normocephalic.  Right Ear: External ear normal.  Left Ear: External ear normal.  Nose: Nose normal.  Eyes: Conjunctivae are normal. Right eye exhibits no discharge. Left eye exhibits no discharge.  Neck: Normal range of motion.  Cardiovascular: Normal rate, regular rhythm and normal heart sounds.   No murmur heard. Pulmonary/Chest: Effort normal. No respiratory distress. He has no wheezes. He has no rales.  Coarse breath sounds in the bilateral bases.   Abdominal: Soft. There is no tenderness. There is no rebound and no guarding.  Musculoskeletal: He exhibits edema. He exhibits no tenderness.  Pitting lower extremity edema. Right foot in walking boot.   Neurological: He is alert. No cranial nerve deficit. Coordination normal.  Skin: Skin is warm and dry. No erythema. No pallor.  Psychiatric: He has a normal mood and affect. His behavior is normal.  Nursing note and vitals reviewed.  ED Treatments / Results  DIAGNOSTIC STUDIES: Oxygen Saturation is 99% on RA, normal by my interpretation.   COORDINATION OF CARE: 10:29 AM-Discussed next steps with pt. Pt verbalized understanding and is  agreeable with the plan.   Labs (all labs ordered are listed, but only abnormal results are displayed) Labs Reviewed  CBC WITH DIFFERENTIAL/PLATELET - Abnormal; Notable for the following:       Result Value   Hemoglobin 12.0 (*)    HCT 36.6 (*)    All other components within normal limits  BRAIN NATRIURETIC PEPTIDE - Abnormal; Notable for the following:  B Natriuretic Peptide 269.4 (*)    All other components within normal limits  BASIC METABOLIC PANEL - Abnormal; Notable for the following:    Glucose, Bld 158 (*)    Calcium 8.7 (*)    All other components within normal limits  TROPONIN I    EKG  EKG Interpretation  Date/Time:  Thursday March 10 2016 09:44:50 EST Ventricular Rate:  74 PR Interval:    QRS Duration: 95 QT Interval:  416 QTC Calculation: 462 R Axis:   -48 Text Interpretation:  Sinus rhythm Left anterior fascicular block Consider anterior infarct No significant change since last tracing Confirmed by Wilson Singer  MD, [place] [phone]) on 03/10/2016 10:21:50 AM      Radiology Dg Chest 2 View  Result Date: 03/10/2016 CLINICAL DATA:  Shortness of breath, chronic EXAM: CHEST  2 VIEW COMPARISON:  February 18, 2016 and March 17, 2015 FINDINGS: There is mild underlying interstitial edema indicative of a degree of chronic congestive heart failure. There is patchy atelectasis in the lung bases. There is no airspace consolidation. The heart size is upper normal with pulmonary venous hypertension. No adenopathy. No bone lesions. IMPRESSION: There is a degree of chronic congestive heart failure, with mild interstitial edema but no alveolar edema. No airspace consolidation. There is bibasilar atelectasis. Stable cardiac silhouette with pulmonary venous hypertension. Electronically Signed   By: Lowella Grip III M.D.   On: 03/10/2016 10:45    Procedures Procedures   Medications Ordered in ED Medications - No data to display  Initial Impression / Assessment and Plan / ED  Course  I have reviewed the triage vital signs and the nursing notes.  Pertinent labs & imaging results that were available during my care of the patient were reviewed by me and considered in my medical decision making (see chart for details).  Clinical Course     60yM with increasing dyspnea. I question how much of symptoms from CAD versus HF. Cardiac catheterization January 2017 showed severe diffuse LAD and circumflex disease and diffuse disease in the distal right coronary artery. Patient was felt to be high risk for PCI and also unlikely to benefit. Possible CABG discussed but patient declined stating he would never be agreeable to bypass surgery and still feels that way. He also was felt to have possibly inadequate targets. Medically management decided during last hospitalization versus repeat cath.  Pt's views have not changed since that time. and I'm not sure if acute hospitalization would be of much benefit. Reports several pound weight gain. With his symptoms, will increase lasix to 40 mg BID for a few days. Next cards appoint not for a couple weeks. Will discuss with cardiology if they have an additional recommendations or to see if appointment can be rescheduled sooner.      Final Clinical Impressions(s) / ED Diagnoses   Final diagnoses:  Dyspnea, unspecified type   New Prescriptions New Prescriptions   No medications on file    I personally preformed the services scribed in my presence. The recorded information has been reviewed is accurate. Virgel Manifold, MD.     Virgel Manifold, MD 03/17/16 1248

## 2016-03-10 NOTE — Discharge Instructions (Signed)
Dr. Ludwig Clarksrenshaw's office should be calling you to reschedule your appointment sooner. Increase lasix to 40mg  twice a day for the next 5 days and then resume previous dosing.

## 2016-03-11 MED FILL — raNITIdine HCL 150 MG TABS: 150 | 30 days supply | Qty: 60 | Fill #3

## 2016-03-11 MED FILL — LOSARTAN POTASSIUM 25 MG TA: 25 | 30 days supply | Qty: 30 | Fill #1

## 2016-03-12 ENCOUNTER — Ambulatory Visit (HOSPITAL_BASED_OUTPATIENT_CLINIC_OR_DEPARTMENT_OTHER)
Admission: RE | Admit: 2016-03-12 | Discharge: 2016-03-12 | Disposition: A | Discharge status: Home / Self Care | Payer: Medicare Other | Source: Ambulatory Visit | Attending: Podiatry | Admitting: Podiatry

## 2016-03-12 DIAGNOSIS — G629 Polyneuropathy, unspecified: Secondary | ICD-10-CM | POA: Diagnosis not present

## 2016-03-12 DIAGNOSIS — M14671 Charcot's joint, right ankle and foot: Secondary | ICD-10-CM | POA: Diagnosis present

## 2016-03-14 DIAGNOSIS — I130 Hypertensive heart and chronic kidney disease with heart failure and stage 1 through stage 4 chronic kidney disease, or unspecified chronic kidney disease: Secondary | ICD-10-CM | POA: Diagnosis not present

## 2016-03-14 DIAGNOSIS — I5043 Acute on chronic combined systolic (congestive) and diastolic (congestive) heart failure: Secondary | ICD-10-CM | POA: Diagnosis not present

## 2016-03-14 DIAGNOSIS — E1122 Type 2 diabetes mellitus with diabetic chronic kidney disease: Secondary | ICD-10-CM | POA: Diagnosis not present

## 2016-03-14 DIAGNOSIS — M14671 Charcot's joint, right ankle and foot: Secondary | ICD-10-CM | POA: Diagnosis not present

## 2016-03-14 DIAGNOSIS — N183 Chronic kidney disease, stage 3 (moderate): Secondary | ICD-10-CM | POA: Diagnosis not present

## 2016-03-14 DIAGNOSIS — I2510 Atherosclerotic heart disease of native coronary artery without angina pectoris: Secondary | ICD-10-CM | POA: Diagnosis not present

## 2016-03-14 DIAGNOSIS — I214 Non-ST elevation (NSTEMI) myocardial infarction: Secondary | ICD-10-CM | POA: Diagnosis not present

## 2016-03-15 ENCOUNTER — Encounter: Payer: Self-pay | Admitting: Podiatry

## 2016-03-15 ENCOUNTER — Ambulatory Visit (INDEPENDENT_AMBULATORY_CARE_PROVIDER_SITE_OTHER): Payer: Medicare Other | Admitting: Podiatry

## 2016-03-15 DIAGNOSIS — S92301A Fracture of unspecified metatarsal bone(s), right foot, initial encounter for closed fracture: Secondary | ICD-10-CM | POA: Diagnosis not present

## 2016-03-15 DIAGNOSIS — S9032XA Contusion of left foot, initial encounter: Secondary | ICD-10-CM

## 2016-03-15 DIAGNOSIS — M14671 Charcot's joint, right ankle and foot: Secondary | ICD-10-CM | POA: Diagnosis not present

## 2016-03-15 NOTE — Progress Notes (Signed)
Subjective: 61 year old male presents the office in follow-up evaluation of right foot fractures, Charcot. He is been nonweightbearing as much as possible. He does use a knee scooter which she presents with today. He does go for short distances around the house without immobilization and weightbearing. Since last appointment he has had some fluid retention issues and he had to the hospital for this. 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  He is still his Lasix the last week which has helped the swelling to his foot.  Denies any systemic complaints such as fevers, chills, nausea, vomiting. No acute changes since last appointment, and no other complaints at this time.   Objective: AAO x3, NAD; presents today wearing a flat regular shoe. DP/PT pulses palpable bilaterally, CRT less than 3 seconds Protective sensation decreased with Simms Weinstein monofilament The right foot does appear to be wider than the left foot and there is the start of a medial plantar bulge on the foot. There is decreased edema to the right foot there is no erythema or increase in warmth. There is no area of tenderness however he does have significant neuropathy.  On the left foot he was seen there were some heel pain. Upon removal the shoe there was an insert inside his shoe which is bunched up putting pressure to the heel. There is mild bruising to the bottom of the heel and there is indentation of the skin from where this insert was localized. Tendon stripper Moceri. Other areas of tenderness. No open lesions or pre-ulcerative lesions were identified today.  No pain with calf compression, swelling, warmth, erythema  Assessment: 61 year old male right foot Lisfranc fracture/Charcot; left foot contusion  Plan: -Treatment options discussed including all alternatives, risks, and complications -MRI results were discussed with the patient. Recommend continue strict nonweightbearing at all times with ascending knee  scooter in CAM boot. Discussed with them cast immobilization however given other issues he like to hold off on this. -Recommended against when the insert and shoe in the left foot. Continue to monitor for any skin breakdown or any further issues. Monitor for any skin breakdown. -Follow up in 2 weeks for repeat x-rays of the right foot or sooner if needed. Call any questions or concerns in the meantime.  Ovid CurdMatthew Seydina Holliman, DPM  -Etiology of symptoms were discussed -X-rays were ordered and reviewed with the patient. Fracture, subluxation of Lisfranc joint likely result of Charcot. -At this time ordered a CT scan of the right foot. -I again today stressed nonweightbearing in a cast for mobilization however he declined this. I discussed with him risks and complications of not having the cast. He understands this. We will do a cam boot. He hater to stay nonwbearing. I discussed with him sequela Charcot including ulceration, foot deformity, loss of leg. Discussed possible surgical intervention however given his medical conditions we will continue conservative treatment for now and the patient agreement to this and let hold off on any surgical intervention as well. -Follow-up in 3 weeks or sooner if any problems arise. In the meantime, encouraged to call the office with any questions, concerns, change in symptoms.   Ovid CurdMatthew Lars Jeziorski, DPM

## 2016-03-16 ENCOUNTER — Telehealth: Payer: Self-pay | Admitting: Medical

## 2016-03-16 DIAGNOSIS — M14671 Charcot's joint, right ankle and foot: Secondary | ICD-10-CM | POA: Diagnosis not present

## 2016-03-16 DIAGNOSIS — I130 Hypertensive heart and chronic kidney disease with heart failure and stage 1 through stage 4 chronic kidney disease, or unspecified chronic kidney disease: Secondary | ICD-10-CM | POA: Diagnosis not present

## 2016-03-16 DIAGNOSIS — N183 Chronic kidney disease, stage 3 (moderate): Secondary | ICD-10-CM | POA: Diagnosis not present

## 2016-03-16 DIAGNOSIS — E1122 Type 2 diabetes mellitus with diabetic chronic kidney disease: Secondary | ICD-10-CM | POA: Diagnosis not present

## 2016-03-16 DIAGNOSIS — I2510 Atherosclerotic heart disease of native coronary artery without angina pectoris: Secondary | ICD-10-CM | POA: Diagnosis not present

## 2016-03-16 DIAGNOSIS — I214 Non-ST elevation (NSTEMI) myocardial infarction: Secondary | ICD-10-CM | POA: Diagnosis not present

## 2016-03-16 DIAGNOSIS — I5043 Acute on chronic combined systolic (congestive) and diastolic (congestive) heart failure: Secondary | ICD-10-CM | POA: Diagnosis not present

## 2016-03-16 NOTE — Telephone Encounter (Signed)
Patient seen in [facility] on 03/10/16 for Dyspnea/SOB; does not have f/u appointment scheduled [please see note below]. Please Advise/SLS 01/03

## 2016-03-16 NOTE — Telephone Encounter (Signed)
Pt went into the urgent care.   Requesting verbal orders for a nurse to go out to the home to access pt.   1 week 1 and then they will call to request frequency. Also requesting order for hospital bed because pt has been sleeping on the couch. Pt was seen at urgent care for difficulty breathing.    CB: [phone] Timothy Alvarez

## 2016-03-17 ENCOUNTER — Telehealth: Payer: Self-pay | Admitting: Cardiology

## 2016-03-17 MED FILL — CLOPIDOGREL 75 MG TABLET: 75 | 90 days supply | Qty: 90 | Fill #0

## 2016-03-17 NOTE — Progress Notes (Signed)
HPI: FU CAD. Patient previously lived in [place]. He had his first myocardial infarction in 2010. He has had subsequent events. He moved here in May of 2014. Cardiac catheterization in September 2014 and had stent to the mid right coronary artery. There was note of a mid-90% LAD; small caliber and not suitable for PCI or grafting. Ejection fraction 60%. Procedure complicated by contrast nephropathy requiring dialysis. Carotid Dopplers February 2015 showed mild focal plaques in both carotid bifurcations but no hemodynamically significant stenosis. Also with h/o orthostatic hypotension. Last cardiac catheterization in January 2017 showed severe diffuse disease in the LAD and circumflex and diffuse distal disease in the RCA. The stent in the right coronary was patent. Echocardiogram January 2017 showed normal LV function. Patient was treated medically for his coronary disease as his targets were felt to be poor and he did not wish to pursue further evaluation. Patient admitted December 2017 with epigastric pain and dyspnea. Troponin mildly elevated at 1.99. Patient treated medically. Note VQ scan normal. Patient also diuresed for congestive heart failure. Since last seen, patient notes some dyspnea on exertion but no orthopnea or PND. No pedal edema. He denies recurrent chest pain.  Current Outpatient Prescriptions  Medication Sig Dispense Refill  . acetaminophen (TYLENOL) 325 MG tablet Take 650 mg by mouth every 6 (six) hours as needed for mild pain.    Marland Kitchen aspirin 81 MG tablet Take 81 mg by mouth daily.    . brimonidine-timolol (COMBIGAN) 0.2-0.5 % ophthalmic solution Place 1 drop into the right eye every 12 (twelve) hours.    . brinzolamide (AZOPT) 1 % ophthalmic suspension 1 drop 3 (three) times daily.    . clopidogrel (PLAVIX) 75 MG tablet TAKE 1 TABLET (75 MG TOTAL) BY MOUTH DAILY. 90 tablet 3  . co-enzyme Q-10 30 MG capsule Take 30 mg by mouth daily.     . fludrocortisone (FLORINEF) 0.1 MG  tablet TAKE 1 TABLET BY MOUTH TWICE DAILY 60 tablet 3  . furosemide (LASIX) 20 MG tablet Take 1 tablet (20 mg total) by mouth 2 (two) times daily. 60 tablet 6  . gabapentin (NEURONTIN) 600 MG tablet TAKE 1 TABLET BY MOUTH 3 TIMES DAILY. 90 tablet 2  . glucagon 1 MG injection Inject 1 mg into the vein once as needed (Emergency Test Kit). Reported on 04/07/2015    . HYDROcodone-acetaminophen (NORCO/VICODIN) 5-325 MG tablet Take 1 tablet by mouth every 6 (six) hours as needed for severe pain. 60 tablet 0  . insulin lispro (HUMALOG) 100 UNIT/ML injection Inject 1-40 Units into the skin as directed. Use in Insulin Pump, Max daily dose: 100 units/day    . isosorbide mononitrate (IMDUR) 60 MG 24 hr tablet TAKE 1 TABLET (60 MG TOTAL) BY MOUTH DAILY. 30 tablet 6  . latanoprost (XALATAN) 0.005 % ophthalmic solution Place 1 drop into both eyes at bedtime.    Marland Kitchen losartan (COZAAR) 25 MG tablet Take 1 tablet (25 mg total) by mouth daily. 30 tablet 5  . meclizine (ANTIVERT) 12.5 MG tablet Take 1 tablet (12.5 mg total) by mouth 3 (three) times daily as needed for dizziness. 30 tablet 0  . midodrine (PROAMATINE) 10 MG tablet TAKE 1 TABLET BY MOUTH TWICE DAILY 60 tablet 3  . nitroGLYCERIN (NITROSTAT) 0.4 MG SL tablet Place 1 tablet (0.4 mg total) under the tongue every 5 (five) minutes x 3 doses as needed for chest pain. 25 tablet 12  . ranitidine (ZANTAC) 150 MG tablet TAKE 1 TABLET  BY MOUTH TWICE DAILY 60 tablet 5  . rosuvastatin (CRESTOR) 40 MG tablet Take 40 mg by mouth daily.    . SENEXON-S 8.6-50 MG tablet TAKE 1 TABLET BY MOUTH DAILY. 100 tablet 1  . zolpidem (AMBIEN CR) 12.5 MG CR tablet Take 1 tablet (12.5 mg total) by mouth at bedtime. 30 tablet 2   No current facility-administered medications for this visit.      Past Medical History:  Diagnosis Date  . CAD (coronary artery disease)   . Cataracts, bilateral   . CHF (congestive heart failure) ([place])   . Chicken pox   . Contrast dye induced  nephropathy   . Diabetes type 1, uncontrolled ([place])   . Diabetic retinopathy ([place])   . Frequent headaches   . Heart attack 09.13.2014   x5  . Hyperlipidemia   . Measles, [place] (rubella)   . Mumps   . Orthostatic hypotension   . OSA (obstructive sleep apnea) 07/17/2013  . Scarlet fever     Past Surgical History:  Procedure Laterality Date  . APPENDECTOMY  1966  . CARDIAC CATHETERIZATION N/A 03/18/2015   Procedure: Left Heart Cath and Coronary Angiography;  Surgeon: Belva Crome, MD;  Location: [facility];  Service: Cardiovascular;  Laterality: N/A;  . CORONARY ANGIOPLASTY WITH STENT PLACEMENT    . REFRACTIVE SURGERY     Retinopathy  . WISDOM TOOTH EXTRACTION      Social History   Social History  . Marital status: Married    Spouse name: N/A  . Number of children: 1  . Years of education: N/A   Occupational History  . Not on file.   Social History Main Topics  . Smoking status: Never Smoker  . Smokeless tobacco: Never Used  . Alcohol use Yes     Comment: Rare  . Drug use: No  . Sexual activity: Not on file   Other Topics Concern  . 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  They have one grown daughter.   Highest level of education:  Master's Degree    Family History  Problem Relation Age of Onset  . Hypertension Mother 80    Deceased  . Other Mother     s/p PPM  . Vascular Disease Father 15    Deceased  . Transient ischemic attack Father   . Liver cancer Paternal Grandfather   . Breast cancer Sister   . Heart disease Sister     Ablation for rhythm disturbance  . Healthy Brother     x1    ROS: no fevers or chills, productive cough, hemoptysis, dysphasia, odynophagia, melena, hematochezia, dysuria, hematuria, rash, seizure activity, orthopnea, PND, pedal edema, claudication. Remaining systems are negative.  Physical Exam: Well-developed well-nourished in no acute distress.    Skin is warm and dry.  HEENT is normal.  Neck is supple.  Chest is clear to auscultation with normal expansion.  Cardiovascular exam is regular rate and rhythm. 2/6 systolic murmur left sternal border. Abdominal exam nontender or distended. No masses palpated. Extremities show trace edema. neuro grossly intact  A/P  1 Hyperlipidemia-continue statin.   2 coronary artery disease-continue aspirin, Plavix and statin. As outlined previously his targets are not ideal for coronary artery bypass graft and he has stated previously he would not be agreeable to surgery regardless. Continue medical therapy. Patient remains in agreement with this plan.  3 orthostatic hypotension-blood  pressure controlled today. We will not advance medications given history of orthostasis.   4 Chronic diastolic CHF-patient notes increased dyspnea. Change Lasix to 40 mg in the morning and 20 mg in the evening. Check potassium and renal function in 1 week. We discussed importance of fluid restriction and sodium restriction.   Kirk Ruths, MD

## 2016-03-17 NOTE — Telephone Encounter (Signed)
E-sent rx to pharmacy

## 2016-03-18 DIAGNOSIS — I214 Non-ST elevation (NSTEMI) myocardial infarction: Secondary | ICD-10-CM | POA: Diagnosis not present

## 2016-03-18 DIAGNOSIS — M14671 Charcot's joint, right ankle and foot: Secondary | ICD-10-CM | POA: Diagnosis not present

## 2016-03-18 DIAGNOSIS — I2510 Atherosclerotic heart disease of native coronary artery without angina pectoris: Secondary | ICD-10-CM | POA: Diagnosis not present

## 2016-03-18 DIAGNOSIS — N183 Chronic kidney disease, stage 3 (moderate): Secondary | ICD-10-CM | POA: Diagnosis not present

## 2016-03-18 DIAGNOSIS — I130 Hypertensive heart and chronic kidney disease with heart failure and stage 1 through stage 4 chronic kidney disease, or unspecified chronic kidney disease: Secondary | ICD-10-CM | POA: Diagnosis not present

## 2016-03-18 DIAGNOSIS — E1122 Type 2 diabetes mellitus with diabetic chronic kidney disease: Secondary | ICD-10-CM | POA: Diagnosis not present

## 2016-03-18 DIAGNOSIS — I5043 Acute on chronic combined systolic (congestive) and diastolic (congestive) heart failure: Secondary | ICD-10-CM | POA: Diagnosis not present

## 2016-03-20 NOTE — Telephone Encounter (Signed)
Will you let pt know that I would like him to follow up for ED evaluation. For home health evaluation and for me to recommend possible hospital be evaluation the paperwork in epic usually wants me to certify face to face evaluation. And to get hospital bed which may be difficult want to get as much information as necessary to justify the request.  Early afternoon appointment and 30 minutes please he is complicated with severe co morbidities.

## 2016-03-21 NOTE — Telephone Encounter (Signed)
Please call patient and schedule 30-minute early afternoon appointmnet [20-minutes] per provider for ED follow-up, Referral to Home Health, and Face-to-face Encounter for DME [hospital bed] per provider request/SLS 01/08 Thanks.

## 2016-03-21 NOTE — Telephone Encounter (Signed)
Patient is scheduled for Monday, 03/28/16 at 1:30pm, provider aware//SLS

## 2016-03-21 NOTE — Telephone Encounter (Signed)
Telephone [Copy & Paste from incorrect note].03/21/16 Open    03/17/2016 [facility]  Abelino DerrickLuke K Kilroy, PA-C  Cardiology   Medication Refill  Reason for call   Conversation: Medication Refill  (Newest Message First)  March 20, 2016  Esperanza RichtersEdward Saguier, PA-C   Esperanza RichtersEdward Saguier, PA-C  Family Medicine   Orders  Reason for call   Conversation: Orders (Newest Message First)    03/16/16 1:08 PM  You routed this conversation to Whole FoodsEdward Saguier, PA-C    6:45 PM  Note    Will you let pt know that I would like him to follow up for ED evaluation. For home health evaluation and for me to recommend possible hospital be evaluation the paperwork in epic usually wants me to certify face to face evaluation. And to get hospital bed which may be difficult want to get as much information as necessary to justify the request.  Early afternoon appointment and 30 minutes please he is complicated with severe co morbidities.

## 2016-03-22 DIAGNOSIS — E1122 Type 2 diabetes mellitus with diabetic chronic kidney disease: Secondary | ICD-10-CM | POA: Diagnosis not present

## 2016-03-22 DIAGNOSIS — I2510 Atherosclerotic heart disease of native coronary artery without angina pectoris: Secondary | ICD-10-CM | POA: Diagnosis not present

## 2016-03-22 DIAGNOSIS — I5043 Acute on chronic combined systolic (congestive) and diastolic (congestive) heart failure: Secondary | ICD-10-CM | POA: Diagnosis not present

## 2016-03-22 DIAGNOSIS — M14671 Charcot's joint, right ankle and foot: Secondary | ICD-10-CM | POA: Diagnosis not present

## 2016-03-22 DIAGNOSIS — I130 Hypertensive heart and chronic kidney disease with heart failure and stage 1 through stage 4 chronic kidney disease, or unspecified chronic kidney disease: Secondary | ICD-10-CM | POA: Diagnosis not present

## 2016-03-22 DIAGNOSIS — I214 Non-ST elevation (NSTEMI) myocardial infarction: Secondary | ICD-10-CM | POA: Diagnosis not present

## 2016-03-22 DIAGNOSIS — N183 Chronic kidney disease, stage 3 (moderate): Secondary | ICD-10-CM | POA: Diagnosis not present

## 2016-03-23 ENCOUNTER — Encounter: Payer: Self-pay | Admitting: Cardiology

## 2016-03-23 ENCOUNTER — Ambulatory Visit (INDEPENDENT_AMBULATORY_CARE_PROVIDER_SITE_OTHER): Payer: Medicare Other | Admitting: Cardiology

## 2016-03-23 VITALS — BP 135/74 | HR 73 | Ht 66.0 in | Wt 194.8 lb

## 2016-03-23 DIAGNOSIS — H40052 Ocular hypertension, left eye: Secondary | ICD-10-CM | POA: Diagnosis not present

## 2016-03-23 DIAGNOSIS — H5203 Hypermetropia, bilateral: Secondary | ICD-10-CM | POA: Diagnosis not present

## 2016-03-23 DIAGNOSIS — E7849 Other hyperlipidemia: Secondary | ICD-10-CM

## 2016-03-23 DIAGNOSIS — I951 Orthostatic hypotension: Secondary | ICD-10-CM | POA: Diagnosis not present

## 2016-03-23 DIAGNOSIS — I2510 Atherosclerotic heart disease of native coronary artery without angina pectoris: Secondary | ICD-10-CM | POA: Diagnosis not present

## 2016-03-23 DIAGNOSIS — E784 Other hyperlipidemia: Secondary | ICD-10-CM | POA: Diagnosis not present

## 2016-03-23 DIAGNOSIS — H43813 Vitreous degeneration, bilateral: Secondary | ICD-10-CM | POA: Diagnosis not present

## 2016-03-23 DIAGNOSIS — E103511 Type 1 diabetes mellitus with proliferative diabetic retinopathy with macular edema, right eye: Secondary | ICD-10-CM | POA: Diagnosis not present

## 2016-03-23 DIAGNOSIS — E103592 Type 1 diabetes mellitus with proliferative diabetic retinopathy without macular edema, left eye: Secondary | ICD-10-CM | POA: Diagnosis not present

## 2016-03-23 DIAGNOSIS — Z961 Presence of intraocular lens: Secondary | ICD-10-CM | POA: Diagnosis not present

## 2016-03-23 DIAGNOSIS — T380X5A Adverse effect of glucocorticoids and synthetic analogues, initial encounter: Secondary | ICD-10-CM | POA: Diagnosis not present

## 2016-03-23 DIAGNOSIS — H4061X4 Glaucoma secondary to drugs, right eye, indeterminate stage: Secondary | ICD-10-CM | POA: Diagnosis not present

## 2016-03-23 MED ORDER — FUROSEMIDE 20 MG PO TABS: ORAL_TABLET | ORAL | 3 refills | Status: DC

## 2016-03-23 MED FILL — FUROSEMIDE 20 MG TABLET: 20 | 90 days supply | Qty: 270 | Fill #0

## 2016-03-23 NOTE — Patient Instructions (Signed)
Medication Instructions:   INCREASE FUROSEMIDE TO 40 MG IN THE MORNING AND 20 MG IN THE AFTERNOON= 2 TABLETS IN THE AM AND 1 TABLET IN THE PM  Labwork:  Your physician recommends that you return for lab work in: [facility]  Follow-Up:  Your physician wants you to follow-up in: 3 MONTHS WITH DR Shelda PalRENSHAW You will receive a reminder letter in the mail two months in advance. If you don't receive a letter, please call our office to schedule the follow-up appointment.

## 2016-03-24 DIAGNOSIS — M14671 Charcot's joint, right ankle and foot: Secondary | ICD-10-CM | POA: Diagnosis not present

## 2016-03-24 DIAGNOSIS — I214 Non-ST elevation (NSTEMI) myocardial infarction: Secondary | ICD-10-CM | POA: Diagnosis not present

## 2016-03-24 DIAGNOSIS — I2510 Atherosclerotic heart disease of native coronary artery without angina pectoris: Secondary | ICD-10-CM | POA: Diagnosis not present

## 2016-03-24 DIAGNOSIS — N183 Chronic kidney disease, stage 3 (moderate): Secondary | ICD-10-CM | POA: Diagnosis not present

## 2016-03-24 DIAGNOSIS — I5043 Acute on chronic combined systolic (congestive) and diastolic (congestive) heart failure: Secondary | ICD-10-CM | POA: Diagnosis not present

## 2016-03-24 DIAGNOSIS — E1122 Type 2 diabetes mellitus with diabetic chronic kidney disease: Secondary | ICD-10-CM | POA: Diagnosis not present

## 2016-03-24 DIAGNOSIS — I130 Hypertensive heart and chronic kidney disease with heart failure and stage 1 through stage 4 chronic kidney disease, or unspecified chronic kidney disease: Secondary | ICD-10-CM | POA: Diagnosis not present

## 2016-03-26 ENCOUNTER — Encounter (HOSPITAL_BASED_OUTPATIENT_CLINIC_OR_DEPARTMENT_OTHER): Payer: Self-pay | Admitting: Emergency Medicine

## 2016-03-26 ENCOUNTER — Inpatient Hospital Stay (HOSPITAL_BASED_OUTPATIENT_CLINIC_OR_DEPARTMENT_OTHER)
Admission: EM | Admit: 2016-03-26 | Discharge: 2016-03-30 | DRG: 281 | Disposition: A | Discharge status: Home Health | Payer: Medicare HMO | Attending: Cardiovascular Disease | Admitting: Cardiovascular Disease

## 2016-03-26 ENCOUNTER — Emergency Department (HOSPITAL_BASED_OUTPATIENT_CLINIC_OR_DEPARTMENT_OTHER): Payer: Medicare HMO

## 2016-03-26 DIAGNOSIS — E876 Hypokalemia: Secondary | ICD-10-CM | POA: Diagnosis not present

## 2016-03-26 DIAGNOSIS — I252 Old myocardial infarction: Secondary | ICD-10-CM

## 2016-03-26 DIAGNOSIS — E1029 Type 1 diabetes mellitus with other diabetic kidney complication: Secondary | ICD-10-CM | POA: Diagnosis present

## 2016-03-26 DIAGNOSIS — Z888 Allergy status to other drugs, medicaments and biological substances status: Secondary | ICD-10-CM | POA: Diagnosis not present

## 2016-03-26 DIAGNOSIS — Z9641 Presence of insulin pump (external) (internal): Secondary | ICD-10-CM | POA: Diagnosis present

## 2016-03-26 DIAGNOSIS — Z955 Presence of coronary angioplasty implant and graft: Secondary | ICD-10-CM | POA: Diagnosis not present

## 2016-03-26 DIAGNOSIS — E10610 Type 1 diabetes mellitus with diabetic neuropathic arthropathy: Secondary | ICD-10-CM | POA: Diagnosis not present

## 2016-03-26 DIAGNOSIS — G4733 Obstructive sleep apnea (adult) (pediatric): Secondary | ICD-10-CM | POA: Diagnosis present

## 2016-03-26 DIAGNOSIS — I2510 Atherosclerotic heart disease of native coronary artery without angina pectoris: Secondary | ICD-10-CM | POA: Diagnosis not present

## 2016-03-26 DIAGNOSIS — Z794 Long term (current) use of insulin: Secondary | ICD-10-CM

## 2016-03-26 DIAGNOSIS — J09X2 Influenza due to identified novel influenza A virus with other respiratory manifestations: Secondary | ICD-10-CM | POA: Diagnosis not present

## 2016-03-26 DIAGNOSIS — I214 Non-ST elevation (NSTEMI) myocardial infarction: Secondary | ICD-10-CM | POA: Diagnosis not present

## 2016-03-26 DIAGNOSIS — I959 Hypotension, unspecified: Secondary | ICD-10-CM | POA: Diagnosis not present

## 2016-03-26 DIAGNOSIS — G8929 Other chronic pain: Secondary | ICD-10-CM | POA: Diagnosis present

## 2016-03-26 DIAGNOSIS — Z7982 Long term (current) use of aspirin: Secondary | ICD-10-CM

## 2016-03-26 DIAGNOSIS — I5032 Chronic diastolic (congestive) heart failure: Secondary | ICD-10-CM | POA: Diagnosis not present

## 2016-03-26 DIAGNOSIS — Z91041 Radiographic dye allergy status: Secondary | ICD-10-CM | POA: Diagnosis not present

## 2016-03-26 DIAGNOSIS — Z79899 Other long term (current) drug therapy: Secondary | ICD-10-CM

## 2016-03-26 DIAGNOSIS — Z8249 Family history of ischemic heart disease and other diseases of the circulatory system: Secondary | ICD-10-CM

## 2016-03-26 DIAGNOSIS — R05 Cough: Secondary | ICD-10-CM | POA: Diagnosis not present

## 2016-03-26 DIAGNOSIS — E785 Hyperlipidemia, unspecified: Secondary | ICD-10-CM | POA: Diagnosis not present

## 2016-03-26 DIAGNOSIS — J101 Influenza due to other identified influenza virus with other respiratory manifestations: Secondary | ICD-10-CM

## 2016-03-26 LAB — BASIC METABOLIC PANEL
Anion gap: 9 (ref 5–15)
BUN: 17 mg/dL (ref 6–20)
CALCIUM: 8.7 mg/dL — AB (ref 8.9–10.3)
CO2: 27 mmol/L (ref 22–32)
CREATININE: 1.12 mg/dL (ref 0.61–1.24)
Chloride: 103 mmol/L (ref 101–111)
GFR calc non Af Amer: >60 mL/min (ref >60)
GLUCOSE: 141 mg/dL — AB (ref 65–99)
Potassium: 3.3 mmol/L — ABNORMAL LOW (ref 3.5–5.1)
Sodium: 139 mmol/L (ref 135–145)

## 2016-03-26 LAB — CBC WITH DIFFERENTIAL/PLATELET
BASOS PCT: 0 %
Basophils Absolute: 0 10*3/uL (ref 0.0–0.1)
Eosinophils Absolute: 0.2 10*3/uL (ref 0.0–0.7)
Eosinophils Relative: 2 %
HEMATOCRIT: 39.9 % (ref 39.0–52.0)
Hemoglobin: 12.9 g/dL — ABNORMAL LOW (ref 13.0–17.0)
Lymphocytes Relative: 7 %
Lymphs Abs: 0.7 10*3/uL (ref 0.7–4.0)
MCH: 27.9 pg (ref 26.0–34.0)
MCHC: 32.3 g/dL (ref 30.0–36.0)
MCV: 86.2 fL (ref 78.0–100.0)
MONO ABS: 0.7 10*3/uL (ref 0.1–1.0)
MONOS PCT: 7 %
Neutro Abs: 9.2 10*3/uL — ABNORMAL HIGH (ref 1.7–7.7)
Neutrophils Relative %: 84 %
Platelets: 235 10*3/uL (ref 150–400)
RBC: 4.63 MIL/uL (ref 4.22–5.81)
RDW: 14.2 % (ref 11.5–15.5)
WBC: 10.8 10*3/uL — ABNORMAL HIGH (ref 4.0–10.5)

## 2016-03-26 LAB — BRAIN NATRIURETIC PEPTIDE: B NATRIURETIC PEPTIDE 5: 251.7 pg/mL — AB (ref 0.0–100.0)

## 2016-03-26 LAB — TROPONIN I
Troponin I: 0.04 ng/mL (ref <0.03)
Troponin I: 0.11 ng/mL (ref <0.03)
Troponin I: 0.24 ng/mL (ref <0.03)
Troponin I: 0.39 ng/mL (ref <0.03)

## 2016-03-26 LAB — CBG MONITORING, ED: Glucose-Capillary: 146 mg/dL — ABNORMAL HIGH (ref 65–99)

## 2016-03-26 LAB — HEPARIN LEVEL (UNFRACTIONATED): Heparin Unfractionated: 0.54 [IU]/mL (ref 0.30–0.70)

## 2016-03-26 LAB — GLUCOSE, CAPILLARY: Glucose-Capillary: 101 mg/dL — ABNORMAL HIGH (ref 65–99)

## 2016-03-26 MED ORDER — FLUDROCORTISONE ACETATE 0.1 MG PO TABS
0.1000 mg | ORAL_TABLET | Freq: Two times a day (BID) | ORAL | Status: DC
  Administered 2016-03-26 – 2016-03-30 (×8): 0.1 mg via ORAL
  Filled 2016-03-26 (×9): qty 1

## 2016-03-26 MED ORDER — HEPARIN BOLUS VIA INFUSION
4000.0000 [IU] | Freq: Once | INTRAVENOUS | Status: AC
  Administered 2016-03-26: 4000 [IU] via INTRAVENOUS

## 2016-03-26 MED ORDER — ACETAMINOPHEN 325 MG PO TABS
650.0000 mg | ORAL_TABLET | Freq: Four times a day (QID) | ORAL | Status: DC | PRN
  Administered 2016-03-27 – 2016-03-30 (×5): 650 mg via ORAL
  Filled 2016-03-26 (×5): qty 2

## 2016-03-26 MED ORDER — MIDODRINE HCL 5 MG PO TABS
10.0000 mg | ORAL_TABLET | Freq: Two times a day (BID) | ORAL | Status: DC
  Administered 2016-03-26 – 2016-03-30 (×8): 10 mg via ORAL
  Filled 2016-03-26 (×8): qty 2

## 2016-03-26 MED ORDER — BRIMONIDINE TARTRATE 0.2 % OP SOLN
1.0000 [drp] | Freq: Two times a day (BID) | OPHTHALMIC | Status: DC
  Administered 2016-03-26 – 2016-03-30 (×8): 1 [drp] via OPHTHALMIC
  Filled 2016-03-26: qty 5

## 2016-03-26 MED ORDER — FUROSEMIDE 10 MG/ML IJ SOLN
40.0000 mg | Freq: Once | INTRAMUSCULAR | Status: AC
  Administered 2016-03-26: 40 mg via INTRAVENOUS
  Filled 2016-03-26: qty 4

## 2016-03-26 MED ORDER — HEPARIN (PORCINE) IN NACL 100-0.45 UNIT/ML-% IJ SOLN
1100.0000 [IU]/h | INTRAMUSCULAR | Status: DC
  Administered 2016-03-26: 1200 [IU]/h via INTRAVENOUS
  Administered 2016-03-28: 1100 [IU]/h via INTRAVENOUS
  Filled 2016-03-26 (×3): qty 250

## 2016-03-26 MED ORDER — FLUDROCORTISONE ACETATE 0.1 MG PO TABS: 100.0000 ug | ORAL_TABLET | Freq: Two times a day (BID) | ORAL | Status: DC

## 2016-03-26 MED ORDER — LOSARTAN POTASSIUM 25 MG PO TABS
25.0000 mg | ORAL_TABLET | Freq: Every day | ORAL | Status: DC
  Administered 2016-03-27 – 2016-03-30 (×4): 25 mg via ORAL
  Filled 2016-03-26 (×5): qty 1

## 2016-03-26 MED ORDER — NON FORMULARY: 12.5000 mg | Freq: Every day | Status: DC

## 2016-03-26 MED ORDER — MECLIZINE HCL 25 MG PO TABS
12.5000 mg | ORAL_TABLET | Freq: Three times a day (TID) | ORAL | Status: DC | PRN
Start: 2016-03-26 — End: 2016-03-30

## 2016-03-26 MED ORDER — BRINZOLAMIDE 1 % OP SUSP
1.0000 [drp] | Freq: Three times a day (TID) | OPHTHALMIC | Status: DC
  Administered 2016-03-26 – 2016-03-30 (×11): 1 [drp] via OPHTHALMIC
  Filled 2016-03-26: qty 10

## 2016-03-26 MED ORDER — LATANOPROST 0.005 % OP SOLN
1.0000 [drp] | Freq: Every day | OPHTHALMIC | Status: DC
  Administered 2016-03-26 – 2016-03-29 (×4): 1 [drp] via OPHTHALMIC
  Filled 2016-03-26: qty 2.5

## 2016-03-26 MED ORDER — NITROGLYCERIN 0.4 MG SL SUBL: 0.4000 mg | SUBLINGUAL_TABLET | SUBLINGUAL | Status: DC | PRN

## 2016-03-26 MED ORDER — TIMOLOL MALEATE 0.5 % OP SOLN
1.0000 [drp] | Freq: Two times a day (BID) | OPHTHALMIC | Status: DC
  Administered 2016-03-26 – 2016-03-30 (×8): 1 [drp] via OPHTHALMIC
  Filled 2016-03-26: qty 5

## 2016-03-26 MED ORDER — FAMOTIDINE 20 MG PO TABS
10.0000 mg | ORAL_TABLET | Freq: Every day | ORAL | Status: DC
Start: 2016-03-27 — End: 2016-03-30
  Administered 2016-03-27 – 2016-03-30 (×4): 10 mg via ORAL
  Filled 2016-03-26 (×4): qty 1

## 2016-03-26 MED ORDER — ASPIRIN 81 MG PO CHEW
324.0000 mg | CHEWABLE_TABLET | Freq: Once | ORAL | Status: AC
  Administered 2016-03-26: 324 mg via ORAL
  Filled 2016-03-26: qty 4

## 2016-03-26 MED ORDER — ROSUVASTATIN CALCIUM 10 MG PO TABS
40.0000 mg | ORAL_TABLET | Freq: Every day | ORAL | Status: DC
  Administered 2016-03-27 – 2016-03-29 (×3): 40 mg via ORAL
  Filled 2016-03-26 (×4): qty 4

## 2016-03-26 MED ORDER — BENZONATATE 100 MG PO CAPS
100.0000 mg | ORAL_CAPSULE | Freq: Once | ORAL | Status: AC
Start: 2016-03-26 — End: 2016-03-26
  Administered 2016-03-26: 100 mg via ORAL
  Filled 2016-03-26: qty 1

## 2016-03-26 MED ORDER — SENNOSIDES-DOCUSATE SODIUM 8.6-50 MG PO TABS
1.0000 | ORAL_TABLET | Freq: Every day | ORAL | Status: DC
  Administered 2016-03-27 – 2016-03-30 (×4): 1 via ORAL
  Filled 2016-03-26 (×4): qty 1

## 2016-03-26 MED ORDER — ZOLPIDEM TARTRATE 5 MG PO TABS
10.0000 mg | ORAL_TABLET | Freq: Every day | ORAL | Status: DC
  Administered 2016-03-26 – 2016-03-29 (×4): 10 mg via ORAL
  Filled 2016-03-26 (×4): qty 2

## 2016-03-26 MED ORDER — INSULIN LISPRO 100 UNIT/ML ~~LOC~~ SOLN: 1.0000 [IU] | SUBCUTANEOUS | Status: DC

## 2016-03-26 MED ORDER — ACETAMINOPHEN 500 MG PO TABS
1000.0000 mg | ORAL_TABLET | Freq: Once | ORAL | Status: AC
  Administered 2016-03-26: 1000 mg via ORAL
  Filled 2016-03-26: qty 2

## 2016-03-26 MED ORDER — GABAPENTIN 600 MG PO TABS
600.0000 mg | ORAL_TABLET | Freq: Three times a day (TID) | ORAL | Status: DC
  Administered 2016-03-26 – 2016-03-30 (×12): 600 mg via ORAL
  Filled 2016-03-26 (×12): qty 1

## 2016-03-26 MED ORDER — ASPIRIN EC 81 MG PO TBEC
81.0000 mg | DELAYED_RELEASE_TABLET | Freq: Every day | ORAL | Status: DC
  Administered 2016-03-27 – 2016-03-30 (×4): 81 mg via ORAL
  Filled 2016-03-26 (×5): qty 1

## 2016-03-26 MED ORDER — ISOSORBIDE MONONITRATE ER 60 MG PO TB24
60.0000 mg | ORAL_TABLET | Freq: Every day | ORAL | Status: DC
  Administered 2016-03-27: 60 mg via ORAL
  Filled 2016-03-26 (×2): qty 1

## 2016-03-26 MED ORDER — CLOPIDOGREL BISULFATE 75 MG PO TABS
75.0000 mg | ORAL_TABLET | Freq: Every day | ORAL | Status: DC
  Administered 2016-03-27 – 2016-03-30 (×4): 75 mg via ORAL
  Filled 2016-03-26 (×4): qty 1

## 2016-03-26 MED ORDER — HYDROCODONE-ACETAMINOPHEN 5-325 MG PO TABS
1.0000 | ORAL_TABLET | Freq: Four times a day (QID) | ORAL | Status: DC | PRN
  Administered 2016-03-26: 1 via ORAL
  Filled 2016-03-26: qty 1

## 2016-03-26 MED ORDER — LEVOFLOXACIN 750 MG PO TABS
750.0000 mg | ORAL_TABLET | Freq: Once | ORAL | Status: AC
Start: 2016-03-26 — End: 2016-03-26
  Administered 2016-03-26: 750 mg via ORAL
  Filled 2016-03-26: qty 1

## 2016-03-26 MED ORDER — BRIMONIDINE TARTRATE-TIMOLOL 0.2-0.5 % OP SOLN
1.0000 [drp] | Freq: Two times a day (BID) | OPHTHALMIC | Status: DC
Start: 2016-03-26 — End: 2016-03-26

## 2016-03-26 NOTE — ED Notes (Signed)
[facility] to 3W [facility].

## 2016-03-26 NOTE — ED Notes (Signed)
Shanda BumpsJessica, RN 3W notified that pt is en route to facility via carelink.  Pt alert and oriented at discharge. Carelink given : EMTALA, carelink/transfer, med necessity form, e-signature, and facesheet.  Pt wife took clothing home and carelink sent with fanny pack filled with eye glasses and insulin pump.

## 2016-03-26 NOTE — ED Notes (Signed)
Pt wife took blood sugar and was 137.  Pt has omnipod in R leg which coordinates with glucose meter.

## 2016-03-26 NOTE — Progress Notes (Signed)
ANTICOAGULATION CONSULT NOTE - Initial Consult  Pharmacy Consult for Heparin Indication: chest pain/ACS  Allergies  Allergen Reactions  . Contrast Media [Iodinated Diagnostic Agents] Other (See Comments)    Renal Failure.   . Metrizamide Other (See Comments)    Renal Failure.   . Lisinopril Cough    Patient Measurements: Height: 5\' 6"  (167.6 cm) Weight: 190 lb (86.2 kg) IBW/kg (Calculated) : 63.8  Vital Signs: Temp: 99.7 F (37.6 C) (01/13 0938) Temp Source: Oral (01/13 0938) BP: 141/68 (01/13 1318) Pulse Rate: 82 (01/13 1318)  Labs:  Recent Labs  03/26/16 0958 03/26/16 1300  HGB 12.9*  --   HCT 39.9  --   PLT 235  --   CREATININE 1.12  --   TROPONINI 0.04* 0.11*    Estimated Creatinine Clearance: 70.4 mL/min (by C-G formula based on SCr of 1.12 mg/dL).   Medical History: Past Medical History:  Diagnosis Date  . CAD (coronary artery disease)   . Cataracts, bilateral   . CHF (congestive heart failure) ([facility])   . Chicken pox   . Contrast dye induced nephropathy   . Diabetes type 1, uncontrolled ([facility])   . Diabetic retinopathy ([facility])   . Frequent headaches   . Heart attack 09.13.2014   x5  . Hyperlipidemia   . Measles, [place] (rub[place])   . Mumps   . Orthostatic hypotension   . OSA (obstructive sleep apnea) 07/17/2013  . Scarlet fever     Assessment: 61 year old male to begin heparin for chest pain  Goal of Therapy:  Heparin level 0.3-0.7 units/ml Monitor platelets by anticoagulation protocol: Yes   Plan:  Heparin 4000 units iv bolus x 1 Heparin drip at 1200 units / hr Heparin level 6 hours after heparin starts Daily heparin level, CBC  Thank you Okey RegalLisa Bhumi Godbey, PharmD [phone]  Elwin SleightPowell, Stephanne Greeley Kay 03/26/2016,1:46 PM

## 2016-03-26 NOTE — Progress Notes (Signed)
ANTICOAGULATION CONSULT NOTE - Follow Up Consult  Pharmacy Consult for heparin Indication: chest pain/ACS  Labs:  Recent Labs  03/26/16 0958 03/26/16 1300 03/26/16 1600 03/26/16 1916 03/26/16 2313  HGB 12.9*  --   --   --   --   HCT 39.9  --   --   --   --   PLT 235  --   --   --   --   HEPARINUNFRC  --   --   --   --  0.54  CREATININE 1.12  --   --   --   --   TROPONINI 0.04* 0.11* 0.24* 0.39*  --     Assessment/Plan:  61yo male therapeutic on heparin with initial dosing for CP. Will continue gtt at current rate and confirm stable with am labs.   Vernard GamblesVeronda Jacoba Cherney, PharmD, BCPS  03/26/2016,11:57 PM

## 2016-03-26 NOTE — Progress Notes (Signed)
[facility], complaints of Right foot pain, MD paged order for pain meds given, awaiting med rec, will give pain meds. Will continue to monitor

## 2016-03-26 NOTE — ED Triage Notes (Addendum)
Pt reports being admitted in December to [facility] for sob and chf and increased lasix dose.  Pt started feeling bad yesterday with sob, nasal drainage and congestion, non-productive cough.  Denies fever/chills.  Denies cp.

## 2016-03-26 NOTE — ED Notes (Signed)
Jessica on 3W notified of pt troponin of 0.24 to notify admitting doctor.

## 2016-03-26 NOTE — ED Provider Notes (Signed)
[place] [facility]PT [facility] Provider Note   CSN: [id number] Arrival date & time: 03/26/16  8937     History   Chief Complaint Chief Complaint  Patient presents with  . Cough    HPI Timothy Alvarez is a 61 y.o. male.  Patient with a PMHx of CHF with an EF of 60%, CAD s/p stent placement, and DM with renal manifestations, presents to the ED with a chief complaint of cough and SOB.  Symptoms started last night and have progressively worsened until this morning.  He has tried taking Best boy with no relief. He denies any chest pain, or pain that radiates to his arm or jaw. Denies any diaphoresis. Denies any nausea, vomiting, or diarrhea. He denies any lower extremity swelling or recent weight changes. He was recently increased on his Lasix by Dr. Stanford Breed of cardiology. There are no other associated symptoms. He reports getting the flu shot and pneumonia shot this year.   The history is provided by the patient. No language interpreter was used.    Past Medical History:  Diagnosis Date  . CAD (coronary artery disease)   . Cataracts, bilateral   . CHF (congestive heart failure) ([place])   . Chicken pox   . Contrast dye induced nephropathy   . Diabetes type 1, uncontrolled ([place])   . Diabetic retinopathy ([place])   . Frequent headaches   . Heart attack 09.13.2014   x5  . Hyperlipidemia   . Measles, [place] (rubella)   . Mumps   . Orthostatic hypotension   . OSA (obstructive sleep apnea) 07/17/2013  . Scarlet fever     Patient Active Problem List   Diagnosis Date Noted  . Essential hypertension 02/13/2016  . Foot ulcer, left ([place]) 11/06/2015  . Disability examination 07/26/2015  . Edema 07/01/2015  . Hematuria 04/30/2015  . Gastroesophageal reflux disease without esophagitis 04/22/2015  . NSTEMI (non-ST elevated myocardial infarction) ([place]) 03/18/2015  . Severe comorbid illness   . SOB (shortness of breath) 08/12/2014  . Fatigue 02/03/2014  . OSA (obstructive sleep apnea)  07/17/2013  . Bruit 05/08/2013  . 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 Chronic pain syndrome 04/07/2013  . CAD S/P percutaneous coronary angioplasty 04/07/2013  . Neuropathy ([place]) 04/07/2013  . Orthostatic hypotension 04/07/2013  . Type 2 diabetes mellitus with renal manifestations ([place]) 04/07/2013  . Depression 04/07/2013  . Insomnia 04/07/2013  . Hyperlipidemia 04/07/2013  . Encounter to establish care 04/07/2013    Past Surgical History:  Procedure Laterality Date  . APPENDECTOMY  1966  . CARDIAC CATHETERIZATION N/A 03/18/2015   Procedure: Left Heart Cath and Coronary Angiography;  Surgeon: Belva Crome, MD;  Location: [facility];  Service: Cardiovascular;  Laterality: N/A;  . CORONARY ANGIOPLASTY WITH STENT PLACEMENT    . REFRACTIVE SURGERY     Retinopathy  . WISDOM TOOTH EXTRACTION         Home Medications    Prior to Admission medications   Medication Sig Start Date End Date Taking? Authorizing Provider  acetaminophen (TYLENOL) 325 MG tablet Take 650 mg by mouth every 6 (six) hours as needed for mild pain.    Historical Provider, MD  aspirin 81 MG tablet Take 81 mg by mouth daily.    Historical Provider, MD  brimonidine-timolol (COMBIGAN) 0.2-0.5 % ophthalmic solution Place 1 drop into the right eye every 12 (twelve) hours.    Historical Provider, MD  brinzolamide (AZOPT) 1 % ophthalmic suspension 1 drop 3 (three) times daily.  Historical Provider, MD  clopidogrel (PLAVIX) 75 MG tablet TAKE 1 TABLET (75 MG TOTAL) BY MOUTH DAILY. 03/17/16   Erlene Quan, PA-C  co-enzyme Q-10 30 MG capsule Take 30 mg by mouth daily.     Historical Provider, MD  fludrocortisone (FLORINEF) 0.1 MG tablet TAKE 1 TABLET BY MOUTH TWICE DAILY 01/28/16   Brunetta Jeans, PA-C  furosemide (LASIX) 20 MG tablet 2 tablets in the morning and 1 tablet in the afternoon 03/23/16   Lelon Perla, MD  gabapentin (NEURONTIN) 600 MG tablet TAKE 1 TABLET BY MOUTH 3 TIMES DAILY. 11/13/15    Brunetta Jeans, PA-C  glucagon 1 MG injection Inject 1 mg into the vein once as needed (Emergency Test Kit). Reported on 04/07/2015    Historical Provider, MD  HYDROcodone-acetaminophen (NORCO/VICODIN) 5-325 MG tablet Take 1 tablet by mouth every 6 (six) hours as needed for severe pain. 04/22/15   Brunetta Jeans, PA-C  insulin lispro (HUMALOG) 100 UNIT/ML injection Inject 1-40 Units into the skin as directed. Use in Insulin Pump, Max daily dose: 100 units/day    Historical Provider, MD  isosorbide mononitrate (IMDUR) 60 MG 24 hr tablet TAKE 1 TABLET (60 MG TOTAL) BY MOUTH DAILY. 03/04/16   Lelon Perla, MD  latanoprost (XALATAN) 0.005 % ophthalmic solution Place 1 drop into both eyes at bedtime.    Historical Provider, MD  losartan (COZAAR) 25 MG tablet Take 1 tablet (25 mg total) by mouth daily. 02/14/16   Brittainy Erie Noe, PA-C  meclizine (ANTIVERT) 12.5 MG tablet Take 1 tablet (12.5 mg total) by mouth 3 (three) times daily as needed for dizziness. 02/27/14   Percell Miller Saguier, PA-C  midodrine (PROAMATINE) 10 MG tablet TAKE 1 TABLET BY MOUTH TWICE DAILY 01/28/16   Brunetta Jeans, PA-C  nitroGLYCERIN (NITROSTAT) 0.4 MG SL tablet Place 1 tablet (0.4 mg total) under the tongue every 5 (five) minutes x 3 doses as needed for chest pain. 03/20/15   Bhavinkumar Bhagat, PA  ranitidine (ZANTAC) 150 MG tablet TAKE 1 TABLET BY MOUTH TWICE DAILY 12/09/15   Brunetta Jeans, PA-C  rosuvastatin (CRESTOR) 40 MG tablet Take 40 mg by mouth daily. 01/28/16   Historical Provider, MD  SENEXON-S 8.6-50 MG tablet TAKE 1 TABLET BY MOUTH DAILY. 02/13/15   Brunetta Jeans, PA-C  zolpidem (AMBIEN CR) 12.5 MG CR tablet Take 1 tablet (12.5 mg total) by mouth at bedtime. 03/03/16   Mackie Pai, PA-C    Family History Family History  Problem Relation Age of Onset  . Hypertension Mother 44    Deceased  . Other Mother     s/p PPM  . Vascular Disease Father 60    Deceased  . Transient ischemic attack Father   .  Liver cancer Paternal Grandfather   . Breast cancer Sister   . Heart disease Sister     Ablation for rhythm disturbance  . Healthy Brother     x1    Social History Social History  Substance Use Topics  . Smoking status: Never Smoker  . Smokeless tobacco: Never Used  . Alcohol use Yes     Comment: Rare     Allergies   Contrast media [iodinated diagnostic agents]; Metrizamide; and Lisinopril   Review of Systems Review of Systems  Constitutional: Positive for chills. Negative for fever.  HENT: Negative for postnasal drip, rhinorrhea, sinus pressure, sneezing and sore throat.   Respiratory: Positive for cough and shortness of breath.   Cardiovascular: Negative  for chest pain.  Gastrointestinal: Negative for abdominal pain, constipation, diarrhea, nausea and vomiting.  Genitourinary: Negative for dysuria.  All other systems reviewed and are negative.    Physical Exam Updated Vital Signs BP 155/81 (BP Location: Right Arm)   Pulse 86   Temp 99.7 F (37.6 C) (Oral)   Resp 18   Ht 5' 6"  (1.676 m)   Wt 86.2 kg   SpO2 98%   BMI 30.67 kg/m   Physical Exam  Constitutional: He is oriented to person, place, and time. He appears well-developed and well-nourished.  HENT:  Head: Normocephalic and atraumatic.  Eyes: Conjunctivae and EOM are normal. Pupils are equal, round, and reactive to light. Right eye exhibits no discharge. Left eye exhibits no discharge. No scleral icterus.  Neck: Normal range of motion. Neck supple. No JVD present.  Cardiovascular: Normal rate, regular rhythm and normal heart sounds.  Exam reveals no gallop and no friction rub.   No murmur heard. Pulmonary/Chest: Effort normal and breath sounds normal. No respiratory distress. He has no wheezes. He has no rales. He exhibits no tenderness.  Abdominal: Soft. He exhibits no distension and no mass. There is no tenderness. There is no rebound and no guarding.  Musculoskeletal: Normal range of motion. He  exhibits no edema or tenderness.  Neurological: He is alert and oriented to person, place, and time.  Skin: Skin is warm and dry.  Psychiatric: He has a normal mood and affect. His behavior is normal. Judgment and thought content normal.  Nursing note and vitals reviewed.    ED Treatments / Results  Labs (all labs ordered are listed, but only abnormal results are displayed) Labs Reviewed  CBC WITH DIFFERENTIAL/PLATELET  BASIC METABOLIC PANEL  TROPONIN I  BRAIN NATRIURETIC PEPTIDE    EKG  EKG Interpretation  Date/Time:  Saturday March 26 2016 09:41:59 EST Ventricular Rate:  88 PR Interval:    QRS Duration: 93 QT Interval:  381 QTC Calculation: 461 R Axis:   -52 Text Interpretation:  Sinus rhythm Probable left atrial enlargement Left anterior fascicular block Probable left ventricular hypertrophy Anterior Q waves, possibly due to LVH Nonspecific T abnormalities, lateral leads Baseline wander in lead(s) V6 No significant change since last tracing Confirmed by YAO  MD, DAVID ([id number]) on 03/26/2016 9:50:55 AM       Radiology Dg Chest 2 View  Result Date: 03/26/2016 CLINICAL DATA:  61 year-old male c/o sob, congestion, and non-productive cough x 1 day. Denies fever/chills. Hx of CHF, CAD, Diabetes, MI in 2014 EXAM: CHEST - 2 VIEW COMPARISON:  03/10/2016 FINDINGS: Coarse interstitial markings predominately in the lung bases, stable. No confluent airspace infiltrate. Linear scarring or atelectasis in the right middle lobe as before. Heart size and mediastinal contours are within normal limits. No effusion. Visualized bones unremarkable. IMPRESSION: 1. Chronic interstitial changes as before. No acute or superimposed abnormality. Electronically Signed   By: Lucrezia Europe M.D.   On: 03/26/2016 10:22    Procedures Procedures (including critical care time)  Medications Ordered in ED Medications  heparin bolus via infusion 4,000 Units (not administered)  heparin ADULT infusion 100  units/mL (25000 units/251m sodium chloride 0.45%) (1,200 Units/hr Intravenous New Bag/Given 03/26/16 1359)  levofloxacin (LEVAQUIN) tablet 750 mg (750 mg Oral Given 03/26/16 1118)  benzonatate (TESSALON) capsule 100 mg (100 mg Oral Given 03/26/16 1330)  aspirin chewable tablet 324 mg (324 mg Oral Given 03/26/16 1359)     Initial Impression / Assessment and Plan / ED Course  I have reviewed the triage vital signs and the nursing notes.  Pertinent labs & imaging results that were available during my care of the patient were reviewed by me and considered in my medical decision making (see chart for details).  Clinical Course     Patient with cough and shortness of breath that started last night. Symptoms have gradually worsened until now. Will check chest x-ray and labs. Patient does have significant history of CAD and CHF. He did have a recent NSTEMI and recent negative VQ scan. He does not have any new lower extremity swelling. He denies having a productive cough.  Chest x-ray does not show any opacity. Patient seen by and discussed with Dr. Darl Householder, who recommends 3 hour troponin. Initial troponin was 0.04, if felt to troponin is the same or better, plan for discharge to home on Levaquin.  2:15 PM Delta troponin is elevated at 0.11. I discussed the case with Dr. Darl Householder, who agrees with plan for cardiology consultation/admission. Will start heparin drip and give aspirin. Patient does not have chest pain, but shortness of breath thought to be anginal equivalent given rising troponin.  Appreciate Dr. Meda Coffee for accepting the patient in transfer and admission.  CRITICAL CARE Performed by: Montine Circle   Total critical care time: 45 minutes  Critical care time was exclusive of separately billable procedures and treating other patients.  Critical care was necessary to treat or prevent imminent or life-threatening deterioration.  Critical care was time spent personally by me on the following  activities: development of treatment plan with patient and/or surrogate as well as nursing, discussions with consultants, evaluation of patient's response to treatment, examination of patient, obtaining history from patient or surrogate, ordering and performing treatments and interventions, ordering and review of laboratory studies, ordering and review of radiographic studies, pulse oximetry and re-evaluation of patient's condition.   Final Clinical Impressions(s) / ED Diagnoses   Final diagnoses:  NSTEMI (non-ST elevated myocardial infarction) [facility])    New Prescriptions New Prescriptions   No medications on file     Montine Circle, PA-C 03/26/16 Seymour Yao, MD 03/26/16 Lurena Nida

## 2016-03-26 NOTE — H&P (Signed)
Patient ID: Timothy Alvarez MRN: [id number], DOB/AGE: 1955/08/10   Admit date: 03/26/2016  Requesting Physician: Primary Physician: Timothy Alvarez Primary Cardiologist: Timothy Alvarez Reason for admission: NSTEMI  Pt. Profile:  Timothy Alvarez is a 61 y.o. male with a history significant for multivessel CAD, IDDM on insulin pump, carotid atherosclerosis, HFpEF, and dyslipidemia who initially presented to an OSH with 2 days of progressive cough and mild SOB.  Timothy Alvarez has experienced stable dyspnea with moderate exertion since his last hospital stay but over the past 48H he has noted dyspnea with minimal activity such as tying his shoes.  In this same period of time he has developed a non-productive cough that is associated with mild chest pressure.  He has otherwise been CP free and has not experienced the GI symptoms that previously were present during acute coronary syndromes.  He was hemodynamically stable and CP free on presentation to the OSH but was noted to have a mildly elevated troponin to 0.24 and transfer to [name] was arranged for further workup and management.  On arrival here he is still CP free and otherwise feeling well.  He has no acute complaints, specifically denying SOB at rest, although he notes that his cough is still intermittent.  He has been adherent to his prescribed medication regimen including a recent increase in his Lasix dosing to 33m QAM/219mQPM which did decreased his LE edema.  Problem List  Past Medical History:  Diagnosis Date  . CAD (coronary artery disease)   . Cataracts, bilateral   . CHF (congestive heart failure) ([facility]  . Chicken pox   . Contrast dye induced nephropathy   . Diabetes type 1, uncontrolled ([facility]  . Diabetic retinopathy ([facility]  . Frequent headaches   . Heart attack 09.13.2014   x5  . Hyperlipidemia   . Measles, [facility])   . Mumps   . Orthostatic hypotension   . OSA (obstructive sleep apnea) 07/17/2013  . Scarlet  fever     Past Surgical History:  Procedure Laterality Date  . APPENDECTOMY  1966  . CARDIAC CATHETERIZATION N/A 03/18/2015   Procedure: Left Heart Cath and Coronary Angiography;  Surgeon: HeBelva CromeMD;  [facility];  Service: Cardiovascular;  Laterality: N/A;  . CORONARY ANGIOPLASTY WITH STENT PLACEMENT    . REFRACTIVE SURGERY     Retinopathy  . WISDOM TOOTH EXTRACTION       Allergies  Allergies  Allergen Reactions  . Contrast Media [Iodinated Diagnostic Agents] Other (See Comments)    Renal Failure.   . Metrizamide Other (See Comments)    Renal Failure.   . Lisinopril Cough     Home Medications  Prior to Admission medications   Medication Sig Start Date End Date Taking? Authorizing Provider  acetaminophen (TYLENOL) 325 MG tablet Take 650 mg by mouth every 6 (six) hours as needed for mild pain.    Historical Provider, MD  aspirin 81 MG tablet Take 81 mg by mouth daily.    Historical Provider, MD  brimonidine-timolol (COMBIGAN) 0.2-0.5 % ophthalmic solution Place 1 drop into the right eye every 12 (twelve) hours.    Historical Provider, MD  brinzolamide (AZOPT) 1 % ophthalmic suspension 1 drop 3 (three) times daily.    Historical Provider, MD  clopidogrel (PLAVIX) 75 MG tablet TAKE 1 TABLET (75 MG TOTAL) BY MOUTH DAILY. 03/17/16   LuErlene QuanPA-C  co-enzyme Q-10 30 MG capsule Take 30  mg by mouth daily.     Historical Provider, MD  fludrocortisone (FLORINEF) 0.1 MG tablet TAKE 1 TABLET BY MOUTH TWICE DAILY 01/28/16   Brunetta Jeans, PA-C  furosemide (LASIX) 20 MG tablet 2 tablets in the morning and 1 tablet in the afternoon 03/23/16   Lelon Perla, MD  gabapentin (NEURONTIN) 600 MG tablet TAKE 1 TABLET BY MOUTH 3 TIMES DAILY. 11/13/15   Brunetta Jeans, PA-C  glucagon 1 MG injection Inject 1 mg into the vein once as needed (Emergency Test Kit). Reported on 04/07/2015    Historical Provider, MD  HYDROcodone-acetaminophen (NORCO/VICODIN) 5-325 MG tablet  Take 1 tablet by mouth every 6 (six) hours as needed for severe pain. 04/22/15   Brunetta Jeans, PA-C  insulin lispro (HUMALOG) 100 UNIT/ML injection Inject 1-40 Units into the skin as directed. Use in Insulin Pump, Max daily dose: 100 units/day    Historical Provider, MD  isosorbide mononitrate (IMDUR) 60 MG 24 hr tablet TAKE 1 TABLET (60 MG TOTAL) BY MOUTH DAILY. 03/04/16   Lelon Perla, MD  latanoprost (XALATAN) 0.005 % ophthalmic solution Place 1 drop into both eyes at bedtime.    Historical Provider, MD  losartan (COZAAR) 25 MG tablet Take 1 tablet (25 mg total) by mouth daily. 02/14/16   Brittainy Erie Noe, PA-C  meclizine (ANTIVERT) 12.5 MG tablet Take 1 tablet (12.5 mg total) by mouth 3 (three) times daily as needed for dizziness. 02/27/14   Percell Miller Saguier, PA-C  midodrine (PROAMATINE) 10 MG tablet TAKE 1 TABLET BY MOUTH TWICE DAILY 01/28/16   Brunetta Jeans, PA-C  nitroGLYCERIN (NITROSTAT) 0.4 MG SL tablet Place 1 tablet (0.4 mg total) under the tongue every 5 (five) minutes x 3 doses as needed for chest pain. 03/20/15   Bhavinkumar Bhagat, PA  ranitidine (ZANTAC) 150 MG tablet TAKE 1 TABLET BY MOUTH TWICE DAILY 12/09/15   Brunetta Jeans, PA-C  rosuvastatin (CRESTOR) 40 MG tablet Take 40 mg by mouth daily. 01/28/16   Historical Provider, MD  SENEXON-S 8.6-50 MG tablet TAKE 1 TABLET BY MOUTH DAILY. 02/13/15   Brunetta Jeans, PA-C  zolpidem (AMBIEN CR) 12.5 MG CR tablet Take 1 tablet (12.5 mg total) by mouth at bedtime. 03/03/16   Mackie Pai, PA-C    Family History  Family History  Problem Relation Age of Onset  . Hypertension Mother 38    Deceased  . Other Mother     s/p PPM  . Vascular Disease Father 75    Deceased  . Transient ischemic attack Father   . Liver cancer Paternal Grandfather   . Breast cancer Sister   . Heart disease Sister     Ablation for rhythm disturbance  . Healthy Brother     x1   Family Status  Relation Status  . Mother Deceased at age 64  .  Father Deceased at age 83  . Paternal Grandfather Deceased  . Paternal Grandmother Deceased  . Maternal Grandfather Deceased  . Maternal Grandmother Deceased  . Sister   . Sister   . Brother      Social History  Social History   Social History  . Marital status: Married    Spouse name: N/A  . Number of children: 1  . Years of education: N/A   Occupational History  . Not on file.   Social History Main Topics  . Smoking status: Never Smoker  . Smokeless tobacco: Never Used  . Alcohol use Yes  Comment: Rare  . Drug use: No  . Sexual activity: Not on file   Other Topics Concern  . 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  They have one grown daughter.   Highest level of education:  Master's Degree     Review of Systems General:  No chills, fever, night sweats or weight changes.  Cardiovascular:  No chest pain, dyspnea on exertion, edema, orthopnea, palpitations, paroxysmal nocturnal dyspnea. Dermatological: No rash, lesions/masses Respiratory: No cough, dyspnea Urologic: No hematuria, dysuria Abdominal:   No nausea, vomiting, diarrhea, bright red blood per rectum, melena, or hematemesis Neurologic:  No visual changes, wkns, changes in mental status. All other systems reviewed and are otherwise negative except as noted above.  Physical Exam  Blood pressure 133/63, pulse 79, temperature 98.9 F (37.2 C), temperature source Oral, resp. rate (!) 21, height 5' 6"  (1.676 m), weight 86.2 kg (190 lb), SpO2 99 %.  General: Pleasant, NAD, AAOx3 Psych: Normal affect. Neuro: Alert and oriented X 3. Moves all extremities spontaneously. HEENT: Normal  Lungs:  Soft bibasilar inspiratory crackles, no wheeze or rhonci Heart: RRR, +S1 +S2, no appreciable m/r/g, no  Abdomen: Soft, non-tender, non-distended, BS + x 4.  Extremities: No clubbing, cyanosis or edema. DP/PT/Radials 2+ and equal  bilaterally.  Labs   Recent Labs  03/26/16 0958 03/26/16 1300 03/26/16 1600  TROPONINI 0.04* 0.11* 0.24*   Lab Results  Component Value Date   WBC 10.8 (H) 03/26/2016   HGB 12.9 (L) 03/26/2016   HCT 39.9 03/26/2016   MCV 86.2 03/26/2016   PLT 235 03/26/2016    Recent Labs Lab 03/26/16 0958  NA 139  K 3.3*  CL 103  CO2 27  BUN 17  CREATININE 1.12  CALCIUM 8.7*  GLUCOSE 141*   Lab Results  Component Value Date   CHOL 123 (L) 10/21/2015   HDL 64 10/21/2015   LDLCALC 49 10/21/2015   TRIG 51 10/21/2015   Lab Results  Component Value Date   DDIMER 2.40 (H) 02/10/2016     Radiology/Studies  Dg Chest 2 View  Result Date: 03/26/2016 CLINICAL DATA:  61 year-old male c/o sob, congestion, and non-productive cough x 1 day. Denies fever/chills. Hx of CHF, CAD, Diabetes, MI in 2014 EXAM: CHEST - 2 VIEW COMPARISON:  03/10/2016 FINDINGS: Coarse interstitial markings predominately in the lung bases, stable. No confluent airspace infiltrate. Linear scarring or atelectasis in the right middle lobe as before. Heart size and mediastinal contours are within normal limits. No effusion. Visualized bones unremarkable. IMPRESSION: 1. Chronic interstitial changes as before. No acute or superimposed abnormality. Electronically Signed   By: Lucrezia Europe M.D.   On: 03/26/2016 10:22   Dg Chest 2 View  Result Date: 03/10/2016 CLINICAL DATA:  Shortness of breath, chronic EXAM: CHEST  2 VIEW COMPARISON:  February 18, 2016 and March 17, 2015 FINDINGS: There is mild underlying interstitial edema indicative of a degree of chronic congestive heart failure. There is patchy atelectasis in the lung bases. There is no airspace consolidation. The heart size is upper normal with pulmonary venous hypertension. No adenopathy. No bone lesions. IMPRESSION: There is a degree of chronic congestive heart failure, with mild interstitial edema but no alveolar edema. No airspace consolidation. There is bibasilar  atelectasis. Stable cardiac silhouette with pulmonary venous hypertension. Electronically Signed   By: Lowella Grip III M.D.   On: 03/10/2016 10:45  Mr Foot Right Wo Contrast  Result Date: 03/13/2016 CLINICAL DATA:  Charcot foot. EXAM: MRI OF THE RIGHT FOREFOOT WITHOUT CONTRAST TECHNIQUE: Multiplanar, multisequence MR imaging of the right foot was performed. No intravenous contrast was administered. 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 There is collapse of the midfoot with prominent edema in the distal calcaneus, cuboid and cuneiforms and to a lesser degree in the navicular as well as in all of the metatarsal bases. There is a Lisfranc fracture dislocation of the second metatarsal phalangeal joint with lateral subluxation of the second through fifth metatarsal bases and medial subluxation of the base of the first metatarsal with fragmentation of those bases. Secondary flatfoot deformity. Ligaments Disruption of the Lisfranc ligaments. IMPRESSION: 1. Charcot foot with collapse of the midfoot as described. 2. Chronic Lisfranc ligament disruptions. 3. 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 4. 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 Electronically Signed   By: Lorriane Shire M.D.   On: 03/13/2016 08:10   TTE (03/20/15): Study Conclusions  - Left ventricle: distal septal and apical hypokinesis. The cavity   size was mildly dilated. Wall thickness was normal. Systolic   function was normal. The estimated ejection fraction was in the   range of 50% to 55%. Doppler parameters are consistent with   elevated ventricular end-diastolic filling pressure. - Mitral valve: Severe posterior annular calcification. - Atrial  septum: No defect or patent foramen ovale was identified.   CORONARY ANGIOGRAPHY (03/18/15) 1. Mid RCA lesion, 30% stenosed. The lesion was previously treated with a stent (unknown type). 2. Mid RCA to Dist RCA lesion, 50% stenosed. 3. 1st RPLB lesion, 90% stenosed. 4. RPDA-2 lesion, 60% stenosed. 5. RPDA-1 lesion, 65% stenosed. 6. Ost RCA to Prox RCA lesion, 50% stenosed. 7. Ost 1st Mrg to 1st Mrg lesion, 95% stenosed. 8. 1st Mrg lesion, 75% stenosed. 9. 1st Diag lesion, 95% stenosed. 10. Mid LAD lesion, 99% stenosed. 11. Dist LAD lesion, 90% stenosed. 56. Ost LAD to Prox LAD lesion, 75% stenosed.  ECG: tonight's tracing shows NSR with normal axis and normal intervals, LVH with mild repolarization abnormality, delayed R-S transition, overall no significant change from prior tracings  ASSESSMENT AND PLAN  Timothy Alvarez is a 61 y.o. male with a history significant for multivessel CAD, IDDM on insulin pump, carotid atherosclerosis, HFpEF, and dyslipidemia who initially presented to an OSH with 2 days of progressive cough and mild SOB.  The pt had a very mildly elevated troponin on presentation the to OSH and was subsequently started on a heparin gtt although his current presentation seems less consistent with an acute plaque rupture event and more so with a mild heart failure exacerbation.  He has been CP since presentation outside of mild musculoskeletal discomfort with cough and has otherwise been feeling well.  # Cough; mild pulmonary edema 2/2 heart failure exacerbation vs. URI symptoms - Lasix '40mg'$  IV x 1 now, further diuresis in am  - careful I/O - daily weight  # CAD; mild troponin elevation but overall current clinical syndrome seems less consistent with an acute plaque rupture event in this pt with known multivessel disease.  Will continue heparin gtt for now but if troponin remains minimally elevated and pt CP free then may d/c in am - heparin gtt -  continue ASA 72m PO QDAY and  Plavix 714mPO QDAY - Imdur 6064mO QDAY - Rosuvastatin 51m71m QHS - continuous telemetry - trend troponin until peak  # Hypotension; previous issues with low BP with midodrine started to allow for ACEI therapy for HFpEF - continue midodrine per outpatient dosing schedule  # Dyslipidemia;  - continue Rosuvastatin as per above  # IDDM; continue home insulin pump settings  # Chronic foot pain 2/2 Charcot foot - oxycodone per home schedule    Signed, PaulClayborne Dana 03/26/2016, 6:20 PM

## 2016-03-27 DIAGNOSIS — I214 Non-ST elevation (NSTEMI) myocardial infarction: Principal | ICD-10-CM

## 2016-03-27 LAB — BASIC METABOLIC PANEL
ANION GAP: 11 (ref 5–15)
BUN: 21 mg/dL — AB (ref 6–20)
CALCIUM: 8.5 mg/dL — AB (ref 8.9–10.3)
CO2: 27 mmol/L (ref 22–32)
Chloride: 100 mmol/L — ABNORMAL LOW (ref 101–111)
Creatinine, Ser: 1.5 mg/dL — ABNORMAL HIGH (ref 0.61–1.24)
GFR calc Af Amer: 56 mL/min — ABNORMAL LOW (ref >60)
GFR, EST NON AFRICAN AMERICAN: 48 mL/min — AB (ref >60)
GLUCOSE: 172 mg/dL — AB (ref 65–99)
POTASSIUM: 3.5 mmol/L (ref 3.5–5.1)
Sodium: 138 mmol/L (ref 135–145)

## 2016-03-27 LAB — CBC
HCT: 38.1 % — ABNORMAL LOW (ref 39.0–52.0)
Hemoglobin: 12.2 g/dL — ABNORMAL LOW (ref 13.0–17.0)
MCH: 27.6 pg (ref 26.0–34.0)
MCHC: 32 g/dL (ref 30.0–36.0)
MCV: 86.2 fL (ref 78.0–100.0)
PLATELETS: 195 10*3/uL (ref 150–400)
RBC: 4.42 MIL/uL (ref 4.22–5.81)
RDW: 14.6 % (ref 11.5–15.5)
WBC: 6.1 10*3/uL (ref 4.0–10.5)

## 2016-03-27 LAB — LIPID PANEL
CHOL/HDL RATIO: 2.1 ratio
Cholesterol: 108 mg/dL (ref 0–200)
HDL: 51 mg/dL (ref >40)
LDL CALC: 49 mg/dL (ref 0–99)
Triglycerides: 42 mg/dL (ref <150)
VLDL: 8 mg/dL (ref 0–40)

## 2016-03-27 LAB — INFLUENZA PANEL BY PCR (TYPE A & B)
INFLAPCR: POSITIVE — AB
Influenza B By PCR: NEGATIVE

## 2016-03-27 LAB — GLUCOSE, CAPILLARY
Glucose-Capillary: 140 mg/dL — ABNORMAL HIGH (ref 65–99)
Glucose-Capillary: 116 mg/dL — ABNORMAL HIGH (ref 65–99)
Glucose-Capillary: 148 mg/dL — ABNORMAL HIGH (ref 65–99)

## 2016-03-27 LAB — TROPONIN I
TROPONIN I: 0.55 ng/mL — AB (ref <0.03)
TROPONIN I: 0.99 ng/mL — AB (ref <0.03)

## 2016-03-27 LAB — HEPARIN LEVEL (UNFRACTIONATED): HEPARIN UNFRACTIONATED: 0.63 [IU]/mL (ref 0.30–0.70)

## 2016-03-27 MED ORDER — OSELTAMIVIR PHOSPHATE 30 MG PO CAPS
30.0000 mg | ORAL_CAPSULE | Freq: Two times a day (BID) | ORAL | Status: DC
  Administered 2016-03-27 – 2016-03-30 (×6): 30 mg via ORAL
  Filled 2016-03-27 (×12): qty 1

## 2016-03-27 MED ORDER — BENZONATATE 100 MG PO CAPS
100.0000 mg | ORAL_CAPSULE | Freq: Three times a day (TID) | ORAL | Status: DC | PRN
  Administered 2016-03-27 – 2016-03-30 (×5): 100 mg via ORAL
  Filled 2016-03-27 (×5): qty 1

## 2016-03-27 MED ORDER — ISOSORBIDE MONONITRATE ER 60 MG PO TB24
120.0000 mg | ORAL_TABLET | Freq: Every day | ORAL | Status: DC
  Administered 2016-03-28 – 2016-03-30 (×3): 120 mg via ORAL
  Filled 2016-03-27 (×3): qty 2

## 2016-03-27 MED ORDER — INSULIN PUMP
Freq: Three times a day (TID) | SUBCUTANEOUS | Status: DC
  Administered 2016-03-27: 6.25 via SUBCUTANEOUS
  Administered 2016-03-27: 1 via SUBCUTANEOUS
  Filled 2016-03-27: qty 1

## 2016-03-27 NOTE — Progress Notes (Signed)
Patient with fever, Swabbed for Flu and tylenol given, will continue to monitor.

## 2016-03-27 NOTE — Progress Notes (Signed)
Patient Name: Timothy Alvarez Date of Encounter: 03/27/2016  Primary Cardiologist: [facility] Problem List     Active Problems:   NSTEMI (non-ST elevated myocardial infarction) (HCC)     Subjective   No active chest pain.  Mild SOB, continued cough  Inpatient Medications    Scheduled Meds: . aspirin EC  81 mg Oral Daily  . brimonidine  1 drop Right Eye BID   And  . timolol  1 drop Right Eye BID  . brinzolamide  1 drop Both Eyes TID  . clopidogrel  75 mg Oral Daily  . famotidine  10 mg Oral Daily  . fludrocortisone  0.1 mg Oral BID WC  . gabapentin  600 mg Oral TID  . insulin pump   Subcutaneous TID AC & HS  . isosorbide mononitrate  60 mg Oral Daily  . latanoprost  1 drop Both Eyes QHS  . losartan  25 mg Oral Daily  . midodrine  10 mg Oral BID WC  . rosuvastatin  40 mg Oral q1800  . senna-docusate  1 tablet Oral Daily  . zolpidem  10 mg Oral QHS   Continuous Infusions: . heparin 1,200 Units/hr (03/26/16 1359)   PRN Meds: acetaminophen, HYDROcodone-acetaminophen, meclizine, nitroGLYCERIN   Vital Signs    Vitals:   03/26/16 2109 03/26/16 2133 03/27/16 0030 03/27/16 0300  BP: 124/75 (!) 145/76 (!) 159/85 (!) 143/69  Pulse: 85 85 98 91  Resp: 13 18 20  (!) 22  Temp: 99.5 F (37.5 C)  (!) 102.6 F (39.2 C) 100.1 F (37.8 C)  TempSrc: Oral  Oral Oral  SpO2: 98% 99% 97% 95%  Weight:    190 lb 4.8 oz (86.3 kg)  Height:        Intake/Output Summary (Last 24 hours) at 03/27/16 0810 Last data filed at 03/27/16 0644  Gross per 24 hour  Intake            522.6 ml  Output             2375 ml  Net          -1852.4 ml   Filed Weights   03/26/16 0936 03/27/16 0300  Weight: 190 lb (86.2 kg) 190 lb 4.8 oz (86.3 kg)    Physical Exam    GEN: Well nourished, well developed, in no acute distress.  HEENT: Grossly normal.  Neck: Supple, no JVD, carotid bruits, or masses. Cardiac: RRR, no murmurs, rubs, or gallops. No clubbing, cyanosis, edema.   Radials/DP/PT 2+ and equal bilaterally.  Respiratory:  Respirations regular and unlabored, clear to auscultation bilaterally. GI: Soft, nontender, nondistended, BS + x 4. MS: no deformity or atrophy. Skin: warm and dry, no rash. Neuro:  Strength and sensation are intact. Psych: AAOx3.  Normal affect.  Labs    CBC  Recent Labs  03/26/16 0958 03/27/16 0541  WBC 10.8* 6.1  NEUTROABS 9.2*  --   HGB 12.9* 12.2*  HCT 39.9 38.1*  MCV 86.2 86.2  PLT 235 195   Basic Metabolic Panel  Recent Labs  03/26/16 0958 03/27/16 0541  NA 139 138  K 3.3* 3.5  CL 103 100*  CO2 27 27  GLUCOSE 141* 172*  BUN 17 21*  CREATININE 1.12 1.50*  CALCIUM 8.7* 8.5*   Liver Function Tests No results for input(s): AST, ALT, ALKPHOS, BILITOT, PROT, ALBUMIN in the last 72 hours. No results for input(s): LIPASE, AMYLASE in the last 72 hours. Cardiac Enzymes  Recent Labs  03/26/16 1916 03/26/16 2313 03/27/16 0541  TROPONINI 0.39* 0.55* 0.99*   BNP Invalid input(s): POCBNP D-Dimer No results for input(s): DDIMER in the last 72 hours. Hemoglobin A1C No results for input(s): HGBA1C in the last 72 hours. Fasting Lipid Panel  Recent Labs  03/27/16 0541  CHOL 108  HDL 51  LDLCALC 49  TRIG 42  CHOLHDL 2.1   Thyroid Function Tests No results for input(s): TSH, T4TOTAL, T3FREE, THYROIDAB in the last 72 hours.  Invalid input(s): FREET3  Telemetry    Sinus rhythm - Personally Reviewed  ECG    Sinus rhythm, nonspecific ST/T wave changes, LAFB - Personally Reviewed  Radiology    Dg Chest 2 View  Result Date: 03/26/2016 CLINICAL DATA:  61 year-old male c/o sob, congestion, and non-productive cough x 1 day. Denies fever/chills. Hx of CHF, CAD, Diabetes, MI in 2014 EXAM: CHEST - 2 VIEW COMPARISON:  03/10/2016 FINDINGS: Coarse interstitial markings predominately in the lung bases, stable. No confluent airspace infiltrate. Linear scarring or atelectasis in the right middle lobe as before.  Heart size and mediastinal contours are within normal limits. No effusion. Visualized bones unremarkable. IMPRESSION: 1. Chronic interstitial changes as before. No acute or superimposed abnormality. Electronically Signed   By: Corlis Leak M.D.   On: 03/26/2016 10:22    Cardiac Studies   TTE 03/2015 - Left ventricle: distal septal and apical hypokinesis. The cavity   size was mildly dilated. Wall thickness was normal. Systolic   function was normal. The estimated ejection fraction was in the   range of 50% to 55%. Doppler parameters are consistent with   elevated ventricular end-diastolic filling pressure. - Mitral valve: Severe posterior annular calcification. - Atrial septum: No defect or patent foramen ovale was identified.  Cath 03/2015 1. Mid RCA lesion, 30% stenosed. The lesion was previously treated with a stent (unknown type). 2. Mid RCA to Dist RCA lesion, 50% stenosed. 3. 1st RPLB lesion, 90% stenosed. 4. RPDA-2 lesion, 60% stenosed. 5. RPDA-1 lesion, 65% stenosed. 6. Ost RCA to Prox RCA lesion, 50% stenosed. 7. Ost 1st Mrg to 1st Mrg lesion, 95% stenosed. 8. 1st Mrg lesion, 75% stenosed. 9. 1st Diag lesion, 95% stenosed. 10. Mid LAD lesion, 99% stenosed. 11. Dist LAD lesion, 90% stenosed. 12. Ost LAD to Prox LAD lesion, 75% stenosed.    Severe diffuse LAD, and circumflex coronary artery disease. Diffuse disease in the distal RCA.  Patent mid right coronary stent.  Left ventriculography was not performed.LVEDP is 24 mmHg  Patient Profile     ROLLAN Alvarez is a 61 y.o. male with a history significant for multivessel CAD, IDDM on insulin pump, carotid atherosclerosis, HFpEF, and dyslipidemia who initially presented to an OSH with 2 days of progressive cough and mild SOB.   Assessment & Plan      1. NSTEMI: Troponins remain elevated and are continuing to rise. He is chest pain-free, but does continue to have a cough. We'll continue his heparin, aspirin, and Plavix.  Unfortunately, he does have fairly severe diffuse coronary disease, which would make PCI difficult as per prior cath note. We Jan Walters attempt medical management. Of note, he does say that he is hesitant to go to cardiac cath as he has had bad reactions to dye in the past - heparin gtt - continue ASA 81mg  PO QDAY and Plavix 75mg  PO QDAY - Imdur 60mg  PO QDAY - Rosuvastatin 40mg  PO QHS - continuous telemetry - trend troponin until peak   2. Hypotension;  blood pressure currently well-controlled   3. Dyslipidemia: - continue Rosuvastatin as per above  4. IDDM; continue home insulin pump settings   5. Chronic foot pain 2/2 Charcot foot - oxycodone per home schedule  6. Cough; mild pulmonary edema 2/2 heart failure exacerbation vs. URI symptoms Has diuresed well. Cough is improved.  Signed, Cheick Suhr Jorja LoaMartin Kenisha Lynds, MD  03/27/2016, 8:10 AM

## 2016-03-27 NOTE — Progress Notes (Signed)
ANTICOAGULATION CONSULT NOTE - Follow Up Consult  Pharmacy Consult for heparin Indication: chest pain/ACS  Allergies  Allergen Reactions  . Contrast Media [Iodinated Diagnostic Agents] Other (See Comments)    Renal Failure.   . Metrizamide Other (See Comments)    Renal Failure.   . Lisinopril Cough    Patient Measurements: Height: 5\' 6"  (167.6 cm) Weight: 190 lb 4.8 oz (86.3 kg) IBW/kg (Calculated) : 63.8 Heparin Dosing Weight: 81.7 kg  Vital Signs: Temp: 100.5 F (38.1 C) (01/14 1109) Temp Source: Oral (01/14 1109) BP: 133/79 (01/14 1109) Pulse Rate: 81 (01/14 1109)  Labs:  Recent Labs  03/26/16 0958  03/26/16 1916 03/26/16 2313 03/27/16 0541 03/27/16 1118  HGB 12.9*  --   --   --  12.2*  --   HCT 39.9  --   --   --  38.1*  --   PLT 235  --   --   --  195  --   HEPARINUNFRC  --   --   --  0.54  --  0.63  CREATININE 1.12  --   --   --  1.50*  --   TROPONINI 0.04*  < > 0.39* 0.55* 0.99*  --   < > = values in this interval not displayed.  Estimated Creatinine Clearance: 52.6 mL/min (by C-G formula based on SCr of 1.5 mg/dL (H)).    Assessment: 61 yo M to continue on IV heparin for NSTEMI. Per cards, will try to medically manage. Heparin level approaching upper limit of goal at 0.63 on 1200 units/hr. Hgb 12.2 stable, Plt down 195, no bleeding noted.  Goal of Therapy:  Heparin level 0.3-0.7 units/ml Monitor platelets by anticoagulation protocol: Yes   Plan:  Decrease heparin rate slightly to 1150 units/hr Daily heparin level, CBC Monitor for s/sx of bleeding   Mackie Paienee Ackley, PharmD PGY1 Pharmacy Resident Pager: [phone] 03/27/2016 12:19 PM

## 2016-03-27 NOTE — Progress Notes (Addendum)
  Flu swab + for influenza A  Placed on droplet precautions. Per RN symptoms < 48 hours. Start Tamiflu.   Hemodynamically stable.    Precious ReelStephanie Falk Harjit Leider MD 5:55 PM

## 2016-03-28 ENCOUNTER — Telehealth: Payer: Self-pay | Admitting: Medical

## 2016-03-28 ENCOUNTER — Ambulatory Visit: Payer: Medicare Other | Admitting: Medical

## 2016-03-28 LAB — GLUCOSE, CAPILLARY
Glucose-Capillary: 59 mg/dL — ABNORMAL LOW (ref 65–99)
Glucose-Capillary: 59 mg/dL — ABNORMAL LOW (ref 65–99)
Glucose-Capillary: 64 mg/dL — ABNORMAL LOW (ref 65–99)
Glucose-Capillary: 115 mg/dL — ABNORMAL HIGH (ref 65–99)
Glucose-Capillary: 149 mg/dL — ABNORMAL HIGH (ref 65–99)
Glucose-Capillary: 118 mg/dL — ABNORMAL HIGH (ref 65–99)
Glucose-Capillary: 93 mg/dL (ref 65–99)

## 2016-03-28 LAB — CBC
HCT: 38.3 % — ABNORMAL LOW (ref 39.0–52.0)
Hemoglobin: 12.3 g/dL — ABNORMAL LOW (ref 13.0–17.0)
MCH: 27.9 pg (ref 26.0–34.0)
MCHC: 32.1 g/dL (ref 30.0–36.0)
MCV: 86.8 fL (ref 78.0–100.0)
PLATELETS: 187 10*3/uL (ref 150–400)
RBC: 4.41 MIL/uL (ref 4.22–5.81)
RDW: 14.9 % (ref 11.5–15.5)
WBC: 6 10*3/uL (ref 4.0–10.5)

## 2016-03-28 LAB — MRSA PCR SCREENING: MRSA BY PCR: NEGATIVE

## 2016-03-28 LAB — HEPARIN LEVEL (UNFRACTIONATED): HEPARIN UNFRACTIONATED: 0.69 [IU]/mL (ref 0.30–0.70)

## 2016-03-28 MED ORDER — DOCUSATE SODIUM 100 MG PO CAPS
100.0000 mg | ORAL_CAPSULE | Freq: Two times a day (BID) | ORAL | Status: DC | PRN
  Administered 2016-03-28: 100 mg via ORAL
  Filled 2016-03-28: qty 1

## 2016-03-28 MED ORDER — INSULIN PUMP
Freq: Three times a day (TID) | SUBCUTANEOUS | Status: DC
  Administered 2016-03-28: 6.5 via SUBCUTANEOUS
  Administered 2016-03-29: 8.35 via SUBCUTANEOUS
  Administered 2016-03-29: 21:00:00 via SUBCUTANEOUS
  Administered 2016-03-29: 8.8 via SUBCUTANEOUS
  Administered 2016-03-29: 5.4 via SUBCUTANEOUS
  Administered 2016-03-30: 4.95 via SUBCUTANEOUS
  Administered 2016-03-30: 6 via SUBCUTANEOUS
  Filled 2016-03-28: qty 1

## 2016-03-28 MED ORDER — HEPARIN SODIUM (PORCINE) 5000 UNIT/ML IJ SOLN
5000.0000 [IU] | Freq: Three times a day (TID) | INTRAMUSCULAR | Status: DC
  Administered 2016-03-28 – 2016-03-29 (×3): 5000 [IU] via SUBCUTANEOUS
  Filled 2016-03-28 (×4): qty 1

## 2016-03-28 NOTE — Progress Notes (Addendum)
Hypoglycemic Event  CBG:59  Treatment: 15 GM carbohydrate snack  Symptoms: None  Follow-up CBG: Time:0825 CBG Result: 64  Possible Reasons for Event: Inadequate meal intake  Comments/MD notified:Laura Ingold - pt was npo and no prn orders in place for hypoglycemia. NP gave ok for 15 gm snack.     Alinda DoomsLauren G Adelyn Roscher

## 2016-03-28 NOTE — Telephone Encounter (Signed)
Pt's spouse lvm at 9:35 making provider aware that pt has been admitted to the hospital and will not be at his appt today.

## 2016-03-28 NOTE — Progress Notes (Signed)
Patient Name: Timothy Alvarez Date of Encounter: 03/28/2016  Primary Cardiologist: Dr [facility] Problem List     Active Problems:   NSTEMI (non-ST elevated myocardial infarction) [facility])     Subjective   Dyspnea with exertion but improving; no chest pain  Inpatient Medications    Scheduled Meds: . aspirin EC  81 mg Oral Daily  . brimonidine  1 drop Right Eye BID   And  . timolol  1 drop Right Eye BID  . brinzolamide  1 drop Both Eyes TID  . clopidogrel  75 mg Oral Daily  . famotidine  10 mg Oral Daily  . fludrocortisone  0.1 mg Oral BID WC  . gabapentin  600 mg Oral TID  . insulin pump   Subcutaneous TID AC & HS  . isosorbide mononitrate  120 mg Oral Daily  . latanoprost  1 drop Both Eyes QHS  . losartan  25 mg Oral Daily  . midodrine  10 mg Oral BID WC  . oseltamivir  30 mg Oral BID  . rosuvastatin  40 mg Oral q1800  . senna-docusate  1 tablet Oral Daily  . zolpidem  10 mg Oral QHS   Continuous Infusions: . heparin 1,100 Units/hr (03/28/16 0845)   PRN Meds: acetaminophen, benzonatate, HYDROcodone-acetaminophen, meclizine, nitroGLYCERIN   Vital Signs    Vitals:   03/28/16 0006 03/28/16 0534 03/28/16 0603 03/28/16 0751  BP: (!) 153/72 (!) 146/77  (!) 147/75  Pulse: 75   72  Resp: 18 16  15   Temp: (!) 101.1 F (38.4 C)  98.3 F (36.8 C) 99.1 F (37.3 C)  TempSrc: Oral  Oral Oral  SpO2: 95%   97%  Weight:   191 lb 1.6 oz (86.7 kg)   Height:        Intake/Output Summary (Last 24 hours) at 03/28/16 0852 Last data filed at 03/28/16 0755  Gross per 24 hour  Intake           290.54 ml  Output             1000 ml  Net          -709.46 ml   Filed Weights   03/26/16 0936 03/27/16 0300 03/28/16 0603  Weight: 190 lb (86.2 kg) 190 lb 4.8 oz (86.3 kg) 191 lb 1.6 oz (86.7 kg)    Physical Exam   GEN: Well nourished, well developed, in no acute distress.  HEENT: Grossly normal.  Neck: Supple Cardiac: RRR Respiratory:  CTA GI: Soft, nontender,  nondistended MS: no deformity or atrophy. Skin: warm and dry, no rash. Neuro:  Strength and sensation are intact. Psych: AAOx3.  Normal affect.  Labs    CBC  Recent Labs  03/26/16 0958 03/27/16 0541 03/28/16 0259  WBC 10.8* 6.1 6.0  NEUTROABS 9.2*  --   --   HGB 12.9* 12.2* 12.3*  HCT 39.9 38.1* 38.3*  MCV 86.2 86.2 86.8  PLT 235 195 187   Basic Metabolic Panel  Recent Labs  03/26/16 0958 03/27/16 0541  NA 139 138  K 3.3* 3.5  CL 103 100*  CO2 27 27  GLUCOSE 141* 172*  BUN 17 21*  CREATININE 1.12 1.50*  CALCIUM 8.7* 8.5*   Cardiac Enzymes  Recent Labs  03/26/16 1916 03/26/16 2313 03/27/16 0541  TROPONINI 0.39* 0.55* 0.99*   Fasting Lipid Panel  Recent Labs  03/27/16 0541  CHOL 108  HDL 51  LDLCALC 49  TRIG 42  CHOLHDL 2.1  Telemetry    Sinus - Personally Reviewed   Radiology    Dg Chest 2 View  Result Date: 03/26/2016 CLINICAL DATA:  61 year-old male c/o sob, congestion, and non-productive cough x 1 day. Denies fever/chills. Hx of CHF, CAD, Diabetes, MI in 2014 EXAM: CHEST - 2 VIEW COMPARISON:  03/10/2016 FINDINGS: Coarse interstitial markings predominately in the lung bases, stable. No confluent airspace infiltrate. Linear scarring or atelectasis in the right middle lobe as before. Heart size and mediastinal contours are within normal limits. No effusion. Visualized bones unremarkable. IMPRESSION: 1. Chronic interstitial changes as before. No acute or superimposed abnormality. Electronically Signed   By: Corlis Leak  Hassell M.D.   On: 03/26/2016 10:22    Patient Profile    [age] year old male with past medical history of coronary artery disease, diabetes mellitus, diastolic congestive heart failure admitted with URI symptoms, dyspnea and chest tightness. Troponin mildly increased. Patient tested positive for influenza A.  Assessment & Plan    1 non-ST elevation myocardial infarction-patient's predominant symptoms were upper respiratory  infection/flu. He does have severe diffuse coronary disease and has stated previously he would not consider coronary artery bypass and graft. I think we should continue medical therapy. He is in agreement. Continue aspirin, Plavix and statin. Discontinue IV heparin. Follow for any recurrent symptoms. Note h/o contrast nephropathy.  2 influenza a-continue Tamiflu.  3 hyperlipidemia-continue statin.  4 history of orthostatic hypotension  5 diabetes mellitus  6 History of chronic diastolic congestive heart failure-will need to resume Lasix likely in the morning.  Signed, Olga MillersBrian Haaris Metallo, MD  03/28/2016, 8:52 AM

## 2016-03-28 NOTE — Progress Notes (Signed)
ANTICOAGULATION CONSULT NOTE - Follow Up Consult  Pharmacy Consult for heparin Indication: chest pain/ACS  Allergies  Allergen Reactions  . Contrast Media [Iodinated Diagnostic Agents] Other (See Comments)    Renal Failure  . Metrizamide Other (See Comments)    Renal Failure  . Lisinopril Cough    Patient Measurements: Height: 5\' 6"  (167.6 cm) Weight: 191 lb 1.6 oz (86.7 kg) IBW/kg (Calculated) : 63.8 Heparin Dosing Weight: 81.7 kg  Vital Signs: Temp: 99.1 F (37.3 C) (01/15 0751) Temp Source: Oral (01/15 0751) BP: 147/75 (01/15 0751) Pulse Rate: 72 (01/15 0751)  Labs:  Recent Labs  03/26/16 0958  03/26/16 1916 03/26/16 2313 03/27/16 0541 03/27/16 1118 03/28/16 0259  HGB 12.9*  --   --   --  12.2*  --  12.3*  HCT 39.9  --   --   --  38.1*  --  38.3*  PLT 235  --   --   --  195  --  187  HEPARINUNFRC  --   --   --  0.54  --  0.63 0.69  CREATININE 1.12  --   --   --  1.50*  --   --   TROPONINI 0.04*  < > 0.39* 0.55* 0.99*  --   --   < > = values in this interval not displayed.  Estimated Creatinine Clearance: 52.7 mL/min (by C-G formula based on SCr of 1.5 mg/dL (H)).    Assessment: 61 yo M to continue on IV heparin for NSTEMI. Per cards, will try to medically manage. Heparin level still approaching upper limit of goal at 0.69 after rate decrease to 1150 units/hr. Hgb 12.2 stable, Plt down 195, no bleed or IV line issues per RN.  Goal of Therapy:  Heparin level 0.3-0.7 units/ml Monitor platelets by anticoagulation protocol: Yes   Plan:  Decrease heparin rate slightly to 1100 units/hr to keep in range Daily heparin level, CBC Monitor for s/sx of bleeding F/u length of therapy   Babs BertinHaley Vicci Reder, PharmD, BCPS Clinical Pharmacist 03/28/2016 8:33 AM

## 2016-03-29 LAB — BASIC METABOLIC PANEL
Anion gap: 10 (ref 5–15)
BUN: 21 mg/dL — ABNORMAL HIGH (ref 6–20)
CALCIUM: 8.3 mg/dL — AB (ref 8.9–10.3)
CO2: 28 mmol/L (ref 22–32)
CREATININE: 1.36 mg/dL — AB (ref 0.61–1.24)
Chloride: 99 mmol/L — ABNORMAL LOW (ref 101–111)
GFR calc Af Amer: >60 mL/min (ref >60)
GFR, EST NON AFRICAN AMERICAN: 54 mL/min — AB (ref >60)
GLUCOSE: 126 mg/dL — AB (ref 65–99)
Potassium: 2.9 mmol/L — ABNORMAL LOW (ref 3.5–5.1)
Sodium: 137 mmol/L (ref 135–145)

## 2016-03-29 LAB — GLUCOSE, CAPILLARY
Glucose-Capillary: 94 mg/dL (ref 65–99)
Glucose-Capillary: 176 mg/dL — ABNORMAL HIGH (ref 65–99)
Glucose-Capillary: 203 mg/dL — ABNORMAL HIGH (ref 65–99)
Glucose-Capillary: 115 mg/dL — ABNORMAL HIGH (ref 65–99)
Glucose-Capillary: 200 mg/dL — ABNORMAL HIGH (ref 65–99)

## 2016-03-29 MED ORDER — FUROSEMIDE 40 MG PO TABS
40.0000 mg | ORAL_TABLET | Freq: Every day | ORAL | Status: DC
  Administered 2016-03-29 – 2016-03-30 (×2): 40 mg via ORAL
  Filled 2016-03-29 (×2): qty 1

## 2016-03-29 MED ORDER — POTASSIUM CHLORIDE CRYS ER 20 MEQ PO TBCR
40.0000 meq | EXTENDED_RELEASE_TABLET | Freq: Once | ORAL | Status: AC
  Administered 2016-03-29: 40 meq via ORAL
  Filled 2016-03-29: qty 2

## 2016-03-29 MED ORDER — SODIUM CHLORIDE 0.9% FLUSH: 3.0000 mL | Freq: Two times a day (BID) | INTRAVENOUS | Status: DC

## 2016-03-29 MED ORDER — SODIUM CHLORIDE 0.9% FLUSH: 3.0000 mL | INTRAVENOUS | Status: DC | PRN

## 2016-03-29 NOTE — Progress Notes (Signed)
Patient Name: Timothy Alvarez F Alcock Date of Encounter: 03/29/2016  [facility] Problem List     Active Problems:   NSTEMI (non-ST elevated myocardial infarction) (HCC)     Subjective   Still with dyspnea with exertion but improving; no chest pain  Inpatient Medications    Scheduled Meds: . aspirin EC  81 mg Oral Daily  . brimonidine  1 drop Right Eye BID   And  . timolol  1 drop Right Eye BID  . brinzolamide  1 drop Both Eyes TID  . clopidogrel  75 mg Oral Daily  . famotidine  10 mg Oral Daily  . fludrocortisone  0.1 mg Oral BID WC  . gabapentin  600 mg Oral TID  . heparin subcutaneous  5,000 Units Subcutaneous Q8H  . insulin pump   Subcutaneous TID AC, HS, 0200  . isosorbide mononitrate  120 mg Oral Daily  . latanoprost  1 drop Both Eyes QHS  . losartan  25 mg Oral Daily  . midodrine  10 mg Oral BID WC  . oseltamivir  30 mg Oral BID  . rosuvastatin  40 mg Oral q1800  . senna-docusate  1 tablet Oral Daily  . zolpidem  10 mg Oral QHS   Continuous Infusions:  PRN Meds: acetaminophen, benzonatate, docusate sodium, HYDROcodone-acetaminophen, meclizine, nitroGLYCERIN   Vital Signs    Vitals:   03/28/16 1938 03/28/16 2355 03/29/16 0430 03/29/16 0808  BP: 119/67 (!) 147/72 (!) 142/77   Pulse: 67 67 70 69  Resp: 14 17 20 18   Temp: 98.6 F (37 C) 98.7 F (37.1 C) 98.3 F (36.8 C) 98.1 F (36.7 C)  TempSrc: Oral Oral Oral Oral  SpO2: 99% 100% 100% 100%  Weight:   192 lb 4.8 oz (87.2 kg)   Height:        Intake/Output Summary (Last 24 hours) at 03/29/16 0913 Last data filed at 03/28/16 2126  Gross per 24 hour  Intake              240 ml  Output              500 ml  Net             -260 ml   Filed Weights   03/27/16 0300 03/28/16 0603 03/29/16 0430  Weight: 190 lb 4.8 oz (86.3 kg) 191 lb 1.6 oz (86.7 kg) 192 lb 4.8 oz (87.2 kg)    Physical Exam   GEN: Well nourished, well developed, in no acute distress.  HEENT: Grossly  normal.  Neck: Supple Cardiac: RRR Respiratory:  Mildly diminished BS bases GI: Soft, nontender, nondistended MS: no deformity or atrophy. Skin: warm and dry, no rash. Neuro:  Strength and sensation are intact. Psych: AAOx3.  Normal affect.  Labs    CBC  Recent Labs  03/26/16 0958 03/27/16 0541 03/28/16 0259  WBC 10.8* 6.1 6.0  NEUTROABS 9.2*  --   --   HGB 12.9* 12.2* 12.3*  HCT 39.9 38.1* 38.3*  MCV 86.2 86.2 86.8  PLT 235 195 187   Basic Metabolic Panel  Recent Labs  03/27/16 0541 03/29/16 0400  NA 138 137  K 3.5 2.9*  CL 100* 99*  CO2 27 28  GLUCOSE 172* 126*  BUN 21* 21*  CREATININE 1.50* 1.36*  CALCIUM 8.5* 8.3*   Cardiac Enzymes  Recent Labs  03/26/16 1916 03/26/16 2313 03/27/16 0541  TROPONINI 0.39* 0.55* 0.99*   Fasting Lipid Panel  Recent Labs  03/27/16 0541  CHOL 108  HDL 51  LDLCALC 49  TRIG 42  CHOLHDL 2.1     Telemetry    Sinus with PVCs - Personally Reviewed     Patient Profile    61 year old male with past medical history of coronary artery disease, diabetes mellitus, diastolic congestive heart failure admitted with URI symptoms, dyspnea and chest tightness. Troponin mildly increased. Patient tested positive for influenza A.  Assessment & Plan    1 non-ST elevation myocardial infarction-patient's predominant symptoms were upper respiratory infection/flu. He does have severe diffuse coronary disease and has stated previously he would not consider coronary artery bypass and graft. I think we should continue medical therapy. He is in agreement. Continue aspirin, Plavix and statin. Follow for any recurrent symptoms. Note h/o contrast nephropathy.  2 influenza a-continue Tamiflu.  3 hyperlipidemia-continue statin.  4 history of orthostatic hypotension  5 diabetes mellitus  6 History of chronic diastolic congestive heart failure-Resume lasix 40 mg daily and follow renal function.   7  Hypokalemia-supplement  Signed, Olga Millers, MD  03/29/2016, 9:13 AM

## 2016-03-29 NOTE — Progress Notes (Signed)
Hypoglycemic Event  CBG: 39 ( using pt's cbg machine)  Treatment: 15 GM carbohydrate snack  Symptoms: Sweaty and Hungry  Follow-up CBG: Time:0328 CBG Result:94  Possible Reasons for Event: Inadequate meal intake  Comments/MD notified:protocol    Timothy Alvarez

## 2016-03-30 DIAGNOSIS — J101 Influenza due to other identified influenza virus with other respiratory manifestations: Secondary | ICD-10-CM

## 2016-03-30 LAB — BASIC METABOLIC PANEL
Anion gap: 10 (ref 5–15)
BUN: 19 mg/dL (ref 6–20)
CO2: 29 mmol/L (ref 22–32)
Calcium: 8.6 mg/dL — ABNORMAL LOW (ref 8.9–10.3)
Chloride: 101 mmol/L (ref 101–111)
Creatinine, Ser: 1.4 mg/dL — ABNORMAL HIGH (ref 0.61–1.24)
GFR calc Af Amer: >60 mL/min (ref >60)
GFR, EST NON AFRICAN AMERICAN: 52 mL/min — AB (ref >60)
Glucose, Bld: 119 mg/dL — ABNORMAL HIGH (ref 65–99)
Potassium: 3.6 mmol/L (ref 3.5–5.1)
SODIUM: 140 mmol/L (ref 135–145)

## 2016-03-30 LAB — GLUCOSE, CAPILLARY
Glucose-Capillary: 126 mg/dL — ABNORMAL HIGH (ref 65–99)
Glucose-Capillary: 107 mg/dL — ABNORMAL HIGH (ref 65–99)
Glucose-Capillary: 136 mg/dL — ABNORMAL HIGH (ref 65–99)

## 2016-03-30 MED ORDER — POTASSIUM CHLORIDE CRYS ER 20 MEQ PO TBCR
20.0000 meq | EXTENDED_RELEASE_TABLET | Freq: Every day | ORAL | Status: DC
  Administered 2016-03-30: 20 meq via ORAL
  Filled 2016-03-30: qty 1

## 2016-03-30 MED ORDER — ISOSORBIDE MONONITRATE ER 120 MG PO TB24: 120.0000 mg | ORAL_TABLET | Freq: Every day | ORAL | 6 refills | Status: DC

## 2016-03-30 MED ORDER — FUROSEMIDE 20 MG PO TABS: 20.0000 mg | ORAL_TABLET | Freq: Every evening | ORAL | Status: DC

## 2016-03-30 MED ORDER — CLOPIDOGREL BISULFATE 75 MG PO TABS: 75.0000 mg | ORAL_TABLET | Freq: Every day | ORAL | 3 refills | Status: AC

## 2016-03-30 MED ORDER — OSELTAMIVIR PHOSPHATE 30 MG PO CAPS: 30.0000 mg | ORAL_CAPSULE | Freq: Two times a day (BID) | ORAL | 0 refills | Status: DC

## 2016-03-30 MED ORDER — POTASSIUM CHLORIDE CRYS ER 20 MEQ PO TBCR: 20.0000 meq | EXTENDED_RELEASE_TABLET | Freq: Every day | ORAL | 6 refills | Status: AC

## 2016-03-30 NOTE — Progress Notes (Signed)
  Patient has known Charcot's Foot and requires the ability of frequent repositioning. Head of bead at 30 degrees. Have requested Hospital Bed.   Signed, Ellsworth LennoxBrittany M Amare Bail, PA-C 03/30/2016, 2:46 PM

## 2016-03-30 NOTE — Care Management Important Message (Signed)
Important Message  Patient Details  Name: Timothy Alvarez MRN: [id number] Date of Birth: 1955/04/19   Medicare Important Message Given:  Yes    Gala LewandowskyGraves-Bigelow, [name], RN 03/30/2016, 8:57 AM

## 2016-03-30 NOTE — Care Management Note (Addendum)
Case Management Note  Patient Details  Name: Timothy Alvarez MRN: [id number] Date of Birth: [birth year]  Subjective/Objective:  Pt presented for Nstemi- Plan will be for home once stable. Pt is active with [facility] in regards to OT Services. Pt will need resumption orders and F2F. Pt will need DME order for Hospital Bed as well.                   Action/Plan: CM did make [facility] aware that pt is hospitalized. SOC to begin within 24-48 hours post d/c. Pt wanting to use [facility] in regards to DME Hospital Bed as well. CM to call [facility] to make them aware to monitor for order. No further needs from CM at this time.  Expected Discharge Date:                  Expected Discharge Plan:  Home w Home Health Services  In-House Referral:  NA  Discharge planning Services  CM Consult  Post Acute Care Choice:  Durable Medical Equipment, Home Health Choice offered to:  Patient  DME Arranged:  Hospital bed DME Agency:  [facility]  [facility]  [facility]  Status of Service:  Completed, signed off  If discussed at Long Length of Stay Meetings, dates discussed:    Additional Comments: Cab voucher obtained to provide transportation for patient to get home. No further needs from CM at this time.   Gala LewandowskyGraves-Bigelow, Irja Wheless Kaye, RN 03/30/2016, 10:52 AM

## 2016-03-30 NOTE — Progress Notes (Addendum)
Patient Name: Timothy Alvarez Date of Encounter: 03/30/2016  Primary Cardiologist: Dr [facility] Problem List     Active Problems:   NSTEMI (non-ST elevated myocardial infarction) (HCC)     Subjective   Still with dyspnea with exertion but much improved; chest heavy with certain movements  Inpatient Medications    Scheduled Meds: . aspirin EC  81 mg Oral Daily  . brimonidine  1 drop Right Eye BID   And  . timolol  1 drop Right Eye BID  . brinzolamide  1 drop Both Eyes TID  . clopidogrel  75 mg Oral Daily  . famotidine  10 mg Oral Daily  . fludrocortisone  0.1 mg Oral BID WC  . furosemide  40 mg Oral Daily  . gabapentin  600 mg Oral TID  . heparin subcutaneous  5,000 Units Subcutaneous Q8H  . insulin pump   Subcutaneous TID AC, HS, 0200  . isosorbide mononitrate  120 mg Oral Daily  . latanoprost  1 drop Both Eyes QHS  . losartan  25 mg Oral Daily  . midodrine  10 mg Oral BID WC  . oseltamivir  30 mg Oral BID  . rosuvastatin  40 mg Oral q1800  . senna-docusate  1 tablet Oral Daily  . sodium chloride flush  3 mL Intravenous Q12H  . zolpidem  10 mg Oral QHS   Continuous Infusions:  PRN Meds: acetaminophen, benzonatate, docusate sodium, HYDROcodone-acetaminophen, meclizine, nitroGLYCERIN, sodium chloride flush   Vital Signs    Vitals:   03/29/16 2027 03/29/16 2220 03/30/16 0335 03/30/16 0601  BP:   (!) 136/95 (!) 147/82  Pulse:      Resp:   17 16  Temp: 99.1 F (37.3 C)  98.9 F (37.2 C) 97.9 F (36.6 C)  TempSrc: Oral  Oral Oral  SpO2:  96% 93% 92%  Weight:    195 lb 8 oz (88.7 kg)  Height:        Intake/Output Summary (Last 24 hours) at 03/30/16 0739 Last data filed at 03/30/16 0720  Gross per 24 hour  Intake             1495 ml  Output             1100 ml  Net              395 ml   Filed Weights   03/28/16 0603 03/29/16 0430 03/30/16 0601  Weight: 191 lb 1.6 oz (86.7 kg) 192 lb 4.8 oz (87.2 kg) 195 lb 8 oz (88.7 kg)    Physical Exam    GEN: Well nourished, well developed, in no acute distress.  HEENT: Grossly normal.  Neck: Supple Cardiac: RRR Respiratory:  CTA GI: Soft, nontender, nondistended MS: no deformity or atrophy. Skin: warm and dry, no rash. Neuro:  Strength and sensation are intact. Psych: AAOx3.  Normal affect.  Labs    CBC  Recent Labs  03/28/16 0259  WBC 6.0  HGB 12.3*  HCT 38.3*  MCV 86.8  PLT 187   Basic Metabolic Panel  Recent Labs  03/29/16 0400 03/30/16 0408  NA 137 140  K 2.9* 3.6  CL 99* 101  CO2 28 29  GLUCOSE 126* 119*  BUN 21* 19  CREATININE 1.36* 1.40*  CALCIUM 8.3* 8.6*    Telemetry    Sinus with PVCs - Personally Reviewed     Patient Profile    61 year old male with past medical history of coronary artery disease, diabetes  mellitus, diastolic congestive heart failure admitted with URI symptoms, dyspnea and chest tightness. Troponin mildly increased. Patient tested positive for influenza A.  Assessment & Plan    1 non-ST elevation myocardial infarction-patient's predominant symptoms were upper respiratory infection/flu. He does have severe diffuse coronary disease and has stated previously he would not consider coronary artery bypass and graft. I think we should continue medical therapy. He is in agreement. Continue aspirin, Plavix and statin. Follow for any recurrent symptoms. Note h/o contrast nephropathy.  2 influenza a-continue Tamiflu. Much improved.  3 hyperlipidemia-continue statin.  4 history of orthostatic hypotension  5 diabetes mellitus  6 History of chronic diastolic congestive heart failure-Increase lasix to 40 mg in AM and 20 q PM (home dose).  7 Hypokalemia-supplement  Plan DC today with TOC appt one week; bmet at that time; fu with me 8-12 weeks > 30 min PA and physician time D2  Signed, Olga MillersBrian Allean Montfort, MD  03/30/2016, 7:39 AM  Addendum: Pt unable to travel due to weather; DC in AM. Olga MillersBrian Louie Meaders, MD

## 2016-03-30 NOTE — Discharge Summary (Signed)
Discharge Summary    Patient ID: Timothy Alvarez,  MRN: [id number], DOB/AGE: 11-05-1955 61 y.o.  Admit date: 03/26/2016 Discharge date: 03/30/2016  Primary Care Provider: Mackie Pai Primary Cardiologist: Dr. Stanford Breed  Discharge Diagnoses    Principal Problem:   NSTEMI (non-ST elevated myocardial infarction) [facility]) Active Problems:   Influenza A   History of Present Illness     Timothy Alvarez is a 61 y.o. male with past medical history of multivessel CAD (severe diffuse CAD by cath in 03/2015, declined CABG in the past), IDDM, chronic diastolic CHF (EF 97-35% by echo in 03/2015), HTN, and HLD who presented to [facility] ED on 03/27/2015 for worsening dyspnea with exertion.   Reported dyspnea with minimal activity such as tying his shoes. Also noted development of a non-productive cough that was associated with mild chest pressure.    Troponin was minimally elevated at 0.24 and he was transferred to [facility] for further workup and management. CXR showed chronic interstitial changes with no acute or superimposed abnormalities.  Hospital Course     Consultants: None  The following morning, he denied any repeat episodes of chest pain. Troponin values continued to trend upwards to 0.99. Interventions vs. medical management was discussed with the patient as per his last cath note in 03/2015, PCI would be high-risk and he declined CABG. He was continued on medical management with ASA, Plavix, and statin therapy.   He was noted to be febrile during admission and flu swab was positive for Influenza A. He was placed on droplet precautions and started on Tamiflu. Given Rx at discharge to complete 7-day course.   Respiratory status continued to improve and he was restarted on PO Lasix. Dosing was increased to 58m AM/268mPM. Weight at time of discharge was 195 lbs with creatinine stable at 1.40.  He was last examined by Dr. CrStanford Breednd deemed stable for discharge. A staff message has been  sent to the office to arrange for a 7-day TOC appointment.    _____________  Discharge Vitals Blood pressure (!) 147/82, pulse 81, temperature 97.9 F (36.6 C), temperature source Oral, resp. rate 16, height 5' 6"  (1.676 m), weight 195 lb 8 oz (88.7 kg), SpO2 92 %.  Filed Weights   03/28/16 0603 03/29/16 0430 03/30/16 0601  Weight: 191 lb 1.6 oz (86.7 kg) 192 lb 4.8 oz (87.2 kg) 195 lb 8 oz (88.7 kg)    Labs & Radiologic Studies     CBC  Recent Labs  03/28/16 0259  WBC 6.0  HGB 12.3*  HCT 38.3*  MCV 86.8  PLT 18329 Basic Metabolic Panel  Recent Labs  03/29/16 0400 03/30/16 0408  NA 137 140  K 2.9* 3.6  CL 99* 101  CO2 28 29  GLUCOSE 126* 119*  BUN 21* 19  CREATININE 1.36* 1.40*  CALCIUM 8.3* 8.6*   Liver Function Tests No results for input(s): AST, ALT, ALKPHOS, BILITOT, PROT, ALBUMIN in the last 72 hours. No results for input(s): LIPASE, AMYLASE in the last 72 hours. Cardiac Enzymes No results for input(s): CKTOTAL, CKMB, CKMBINDEX, TROPONINI in the last 72 hours. BNP Invalid input(s): POCBNP D-Dimer No results for input(s): DDIMER in the last 72 hours. Hemoglobin A1C No results for input(s): HGBA1C in the last 72 hours. Fasting Lipid Panel No results for input(s): CHOL, HDL, LDLCALC, TRIG, CHOLHDL, LDLDIRECT in the last 72 hours. Thyroid Function Tests No results for input(s): TSH, T4TOTAL, T3FREE, THYROIDAB in the last 72 hours.  Invalid input(s): FREET3  Dg Chest 2 View  Result Date: 03/26/2016 CLINICAL DATA:  61 year-old male c/o sob, congestion, and non-productive cough x 1 day. Denies fever/chills. Hx of CHF, CAD, Diabetes, MI in 2014 EXAM: CHEST - 2 VIEW COMPARISON:  03/10/2016 FINDINGS: Coarse interstitial markings predominately in the lung bases, stable. No confluent airspace infiltrate. Linear scarring or atelectasis in the right middle lobe as before. Heart size and mediastinal contours are within normal limits. No effusion. Visualized bones  unremarkable. IMPRESSION: 1. Chronic interstitial changes as before. No acute or superimposed abnormality. Electronically Signed   By: Lucrezia Europe M.D.   On: 03/26/2016 10:22   Dg Chest 2 View  Result Date: 03/10/2016 CLINICAL DATA:  Shortness of breath, chronic EXAM: CHEST  2 VIEW COMPARISON:  February 18, 2016 and March 17, 2015 FINDINGS: There is mild underlying interstitial edema indicative of a degree of chronic congestive heart failure. There is patchy atelectasis in the lung bases. There is no airspace consolidation. The heart size is upper normal with pulmonary venous hypertension. No adenopathy. No bone lesions. IMPRESSION: There is a degree of chronic congestive heart failure, with mild interstitial edema but no alveolar edema. No airspace consolidation. There is bibasilar atelectasis. Stable cardiac silhouette with pulmonary venous hypertension. Electronically Signed   By: Lowella Grip III M.D.   On: 03/10/2016 10:45   Mr Foot Right Wo Contrast  Result Date: 03/13/2016 CLINICAL DATA:  Charcot foot. EXAM: MRI OF THE RIGHT FOREFOOT WITHOUT CONTRAST TECHNIQUE: Multiplanar, multisequence MR imaging of the right foot was performed. No intravenous contrast was administered. 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 There is collapse of the midfoot with prominent edema in the distal calcaneus, cuboid and cuneiforms and to a lesser degree in the navicular as well as in all of the metatarsal bases. There is a Lisfranc fracture dislocation of the second metatarsal phalangeal joint with lateral subluxation of the second through fifth metatarsal bases and medial subluxation of the base of the first metatarsal with fragmentation of those bases. Secondary flatfoot deformity.  Ligaments Disruption of the Lisfranc ligaments. IMPRESSION: 1. Charcot foot with collapse of the midfoot as described. 2. Chronic Lisfranc ligament disruptions. 3. 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 4. 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 Electronically Signed   By: Lorriane Shire M.D.   On: 03/13/2016 08:10     Diagnostic Studies/Procedures    Echocardiogram: 03/20/2015 Study Conclusions  - Left ventricle: distal septal and apical hypokinesis. The cavity   size was mildly dilated. Wall thickness was normal. Systolic   function was normal. The estimated ejection fraction was in the   range of 50% to 55%. Doppler parameters are consistent with   elevated ventricular end-diastolic filling pressure. - Mitral valve: Severe posterior annular calcification. - Atrial septum: No defect or patent foramen ovale was identified.     Disposition   Pt is being discharged home today in good condition.  Follow-up Plans & Appointments    Follow-up Information    [place] Follow up.   Why:  Occupational Therapy Contact information: [place] [address] [place] [facility] up.   Why:  Hospital Bed Contact information:  [place] [id number] [phone]        Kirk Ruths, MD Follow up.   Specialty:  Cardiology Why:  The office will contact you to arrange hosptial follow-up. If you do not hear from them in 3 business days, please contact the number provided.  Contact information: [address] [place] [address] [phone]            Discharge Medications     Medication List    TAKE these medications   acetaminophen 325 MG tablet Commonly known as:  TYLENOL Take 650 mg by mouth every 6 (six) hours as needed for mild pain.   aspirin 81 MG tablet Take 81 mg by mouth daily.   brinzolamide  1 % ophthalmic suspension Commonly known as:  AZOPT Place 1 drop into the right eye 3 (three) times daily.   clopidogrel 75 MG tablet Commonly known as:  PLAVIX Take 1 tablet (75 mg total) by mouth daily.   co-enzyme Q-10 30 MG capsule Take 30 mg by mouth daily.   COMBIGAN 0.2-0.5 % ophthalmic solution Generic drug:  brimonidine-timolol Place 1 drop into the right eye every 12 (twelve) hours.   fludrocortisone 0.1 MG tablet Commonly known as:  FLORINEF TAKE 1 TABLET BY MOUTH TWICE DAILY What changed:  See the new instructions.   furosemide 20 MG tablet Commonly known as:  LASIX 2 tablets in the morning and 1 tablet in the afternoon What changed:  how much to take  how to take this  when to take this  additional instructions   gabapentin 600 MG tablet Commonly known as:  NEURONTIN TAKE 1 TABLET BY MOUTH 3 TIMES DAILY. What changed:  See the new instructions.   glucagon 1 MG injection Inject 1 mg into the vein once as needed (Emergency Test Kit). Reported on 04/07/2015   HYDROcodone-acetaminophen 5-325 MG tablet Commonly known as:  NORCO/VICODIN Take 1 tablet by mouth every 6 (six) hours as needed for severe pain.   insulin lispro 100 UNIT/ML injection Commonly known as:  HUMALOG as directed. Use in Insulin Pump, Max daily dose: 100 units/day   isosorbide mononitrate 120 MG 24 hr tablet Commonly known as:  IMDUR Take 1 tablet (120 mg total) by mouth daily. What changed:  medication strength  how much to take   latanoprost 0.005 % ophthalmic solution Commonly known as:  XALATAN Place 1 drop into both eyes at bedtime.   losartan 25 MG tablet Commonly known as:  COZAAR Take 1 tablet (25 mg total) by mouth daily.   meclizine 12.5 MG tablet Commonly known as:  ANTIVERT Take 1 tablet (12.5 mg total) by mouth 3 (three) times daily as needed for dizziness.   midodrine 10 MG tablet Commonly known as:  PROAMATINE TAKE 1 TABLET BY MOUTH TWICE DAILY What  changed:  See the new instructions.   nitroGLYCERIN 0.4 MG SL tablet Commonly known as:  NITROSTAT Place 1 tablet (0.4 mg total) under the tongue every 5 (five) minutes x 3 doses as needed for chest pain.   oseltamivir 30 MG capsule Commonly known as:  TAMIFLU Take 1 capsule (30 mg total) by mouth 2 (two) times daily.   potassium chloride SA 20 MEQ tablet Commonly known as:  K-DUR,KLOR-CON Take 1 tablet (20 mEq total) by mouth daily. Start taking on:  03/31/2016   Probiotic Caps Take 1 capsule by mouth every morning.   ranitidine 150 MG tablet Commonly known as:  ZANTAC TAKE 1  TABLET BY MOUTH TWICE DAILY What changed:  See the new instructions.   rosuvastatin 40 MG tablet Commonly known as:  CRESTOR Take 40 mg by mouth daily.   SENEXON-S 8.6-50 MG tablet Generic drug:  senna-docusate TAKE 1 TABLET BY MOUTH DAILY.   zolpidem 12.5 MG CR tablet Commonly known as:  AMBIEN CR Take 1 tablet (12.5 mg total) by mouth at bedtime.       Allergies Allergies  Allergen Reactions  . Contrast Media [Iodinated Diagnostic Agents] Other (See Comments)    Renal Failure  . Metrizamide Other (See Comments)    Renal Failure  . Lisinopril Cough     Outstanding Labs/Studies   BMET at follow-up appointment   Duration of Discharge Encounter   Greater than 30 minutes including physician time.  Signed, Erma Heritage, PA-C 03/30/2016, 7:16 PM

## 2016-03-30 NOTE — Discharge Instructions (Signed)

## 2016-04-01 ENCOUNTER — Telehealth: Payer: Self-pay | Admitting: Medical

## 2016-04-01 MED ORDER — BENZONATATE 100 MG PO CAPS: 100.0000 mg | ORAL_CAPSULE | Freq: Two times a day (BID) | ORAL | 0 refills | Status: DC | PRN

## 2016-04-01 MED FILL — BENZONATATE 100 MG CAP: 100 | 15 days supply | Qty: 30 | Fill #0

## 2016-04-01 MED FILL — HumaLOG 100 UNIT/ML SOLN: 100 | 30 days supply | Qty: 20 | Fill #3

## 2016-04-01 NOTE — Telephone Encounter (Signed)
Rx to pharmacy per provider Vo/SLS 01/19

## 2016-04-01 NOTE — Telephone Encounter (Signed)
Pt called In to request a Rx for Benzonatate. Pt says that he just got out of the hospital yesterday. Pt says that they gave it to him for cough   Pharmacy: [facility]

## 2016-04-04 DIAGNOSIS — E1122 Type 2 diabetes mellitus with diabetic chronic kidney disease: Secondary | ICD-10-CM | POA: Diagnosis not present

## 2016-04-04 DIAGNOSIS — I130 Hypertensive heart and chronic kidney disease with heart failure and stage 1 through stage 4 chronic kidney disease, or unspecified chronic kidney disease: Secondary | ICD-10-CM | POA: Diagnosis not present

## 2016-04-04 DIAGNOSIS — I2510 Atherosclerotic heart disease of native coronary artery without angina pectoris: Secondary | ICD-10-CM | POA: Diagnosis not present

## 2016-04-04 DIAGNOSIS — S92301A Fracture of unspecified metatarsal bone(s), right foot, initial encounter for closed fracture: Secondary | ICD-10-CM | POA: Diagnosis not present

## 2016-04-04 DIAGNOSIS — I5043 Acute on chronic combined systolic (congestive) and diastolic (congestive) heart failure: Secondary | ICD-10-CM | POA: Diagnosis not present

## 2016-04-04 DIAGNOSIS — S93324D Dislocation of tarsometatarsal joint of right foot, subsequent encounter: Secondary | ICD-10-CM | POA: Diagnosis not present

## 2016-04-04 DIAGNOSIS — M14671 Charcot's joint, right ankle and foot: Secondary | ICD-10-CM | POA: Diagnosis not present

## 2016-04-04 DIAGNOSIS — I214 Non-ST elevation (NSTEMI) myocardial infarction: Secondary | ICD-10-CM | POA: Diagnosis not present

## 2016-04-04 DIAGNOSIS — N183 Chronic kidney disease, stage 3 (moderate): Secondary | ICD-10-CM | POA: Diagnosis not present

## 2016-04-04 MED FILL — LOSARTAN POTASSIUM 25 MG TA: 25 | 30 days supply | Qty: 30 | Fill #2

## 2016-04-04 MED FILL — FLUDROCORTISONE 0.1 MG TAB: 0.1 | 30 days supply | Qty: 60 | Fill #2

## 2016-04-04 MED FILL — MIDODRINE HCL 10 MG TABLET: 10 | 30 days supply | Qty: 60 | Fill #2

## 2016-04-04 MED FILL — ISOSORBIDE MN ER 60 MG TAB: 60 | 30 days supply | Qty: 30 | Fill #1

## 2016-04-05 ENCOUNTER — Telehealth: Payer: Self-pay | Admitting: Medical

## 2016-04-05 ENCOUNTER — Ambulatory Visit (INDEPENDENT_AMBULATORY_CARE_PROVIDER_SITE_OTHER): Payer: Medicare HMO | Admitting: Podiatry

## 2016-04-05 ENCOUNTER — Ambulatory Visit (HOSPITAL_BASED_OUTPATIENT_CLINIC_OR_DEPARTMENT_OTHER)
Admission: RE | Admit: 2016-04-05 | Discharge: 2016-04-05 | Disposition: A | Discharge status: Home / Self Care | Payer: Medicare HMO | Source: Ambulatory Visit | Attending: Podiatry | Admitting: Podiatry

## 2016-04-05 ENCOUNTER — Encounter: Payer: Self-pay | Admitting: Podiatry

## 2016-04-05 DIAGNOSIS — M14671 Charcot's joint, right ankle and foot: Secondary | ICD-10-CM

## 2016-04-05 DIAGNOSIS — M19071 Primary osteoarthritis, right ankle and foot: Secondary | ICD-10-CM | POA: Insufficient documentation

## 2016-04-05 DIAGNOSIS — X58XXXA Exposure to other specified factors, initial encounter: Secondary | ICD-10-CM | POA: Diagnosis not present

## 2016-04-05 DIAGNOSIS — A5216 Charcot's arthropathy (tabetic): Secondary | ICD-10-CM | POA: Insufficient documentation

## 2016-04-05 DIAGNOSIS — M7989 Other specified soft tissue disorders: Secondary | ICD-10-CM | POA: Insufficient documentation

## 2016-04-05 DIAGNOSIS — S92301A Fracture of unspecified metatarsal bone(s), right foot, initial encounter for closed fracture: Secondary | ICD-10-CM | POA: Insufficient documentation

## 2016-04-05 DIAGNOSIS — S92301D Fracture of unspecified metatarsal bone(s), right foot, subsequent encounter for fracture with routine healing: Secondary | ICD-10-CM | POA: Diagnosis not present

## 2016-04-05 DIAGNOSIS — R69 Illness, unspecified: Secondary | ICD-10-CM | POA: Diagnosis not present

## 2016-04-05 DIAGNOSIS — M79671 Pain in right foot: Secondary | ICD-10-CM | POA: Diagnosis not present
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
## 2016-04-05 NOTE — Telephone Encounter (Signed)
Spoke with patient and he stated that he would have his wife call back to schedule the appointment as he does not feel like he is well enough to schedule the appointment himself.

## 2016-04-05 NOTE — Telephone Encounter (Signed)
Documentation faxed to [facility] at [phone]/SLS 01/23  Please see provider note and contact patient to schedule appointment as instructed/SLS 01/23

## 2016-04-06 DIAGNOSIS — I130 Hypertensive heart and chronic kidney disease with heart failure and stage 1 through stage 4 chronic kidney disease, or unspecified chronic kidney disease: Secondary | ICD-10-CM | POA: Diagnosis not present

## 2016-04-06 DIAGNOSIS — M14671 Charcot's joint, right ankle and foot: Secondary | ICD-10-CM | POA: Diagnosis not present

## 2016-04-06 DIAGNOSIS — I2510 Atherosclerotic heart disease of native coronary artery without angina pectoris: Secondary | ICD-10-CM | POA: Diagnosis not present

## 2016-04-06 DIAGNOSIS — I214 Non-ST elevation (NSTEMI) myocardial infarction: Secondary | ICD-10-CM | POA: Diagnosis not present

## 2016-04-06 DIAGNOSIS — E1122 Type 2 diabetes mellitus with diabetic chronic kidney disease: Secondary | ICD-10-CM | POA: Diagnosis not present

## 2016-04-06 DIAGNOSIS — N183 Chronic kidney disease, stage 3 (moderate): Secondary | ICD-10-CM | POA: Diagnosis not present

## 2016-04-06 DIAGNOSIS — I5043 Acute on chronic combined systolic (congestive) and diastolic (congestive) heart failure: Secondary | ICD-10-CM | POA: Diagnosis not present

## 2016-04-07 NOTE — Progress Notes (Signed)
Subjective: 61 year old male presents the office in follow-up evaluation of right foot fractures, Charcot He has been NWB as much as possible but he does walk for short distances in the CAM boot. He again was hospitalized for pneumonia. He gets occasional pain to the right foot but not as much. Denies any systemic complaints such as fevers, chills, nausea, vomiting. No acute changes since last appointment, and no other complaints at this time.   Objective: AAO x3, NAD; presents today wearing a flat regular shoe. DP/PT pulses palpable bilaterally, CRT less than 3 seconds Protective sensation decreased with Simms Weinstein monofilament The right foot does appear to be wider than the left foot and there is the start of a medial plantar bulge on the foot. This has not changed in size. There is minimal edema to the right foot there is no erythema or increase in warmth. There is no area of tenderness however he does have significant neuropathy.  No pain or open lesion on the left foot. There is a bruise still present but improved.  No open lesions or pre-ulcerative lesions were identified today.  No pain with calf compression, swelling, warmth, erythema  Assessment: 61 year old male right foot Lisfranc fracture/Charcot; left foot contusion  Plan: -Treatment options discussed including all alternatives, risks, and complications -X-rays were obtained and reviewed with the patient.  -Discussed to remain NWB in CAM boot. Discussed CROW walker long term.  -Monitor for any skin breakdown. . -Follow up in 3 weeks for repeat x-rays of the right foot or sooner if needed. Call any questions or concerns in the meantime.  Ovid CurdMatthew Braelynne Garinger, DPM  -Etiology of symptoms were discussed -X-rays were ordered and reviewed with the patient. Fracture, subluxation of Lisfranc joint likely result of Charcot. -At this time ordered a CT scan of the right foot. -I again today stressed nonweightbearing in a cast for  mobilization however he declined this. I discussed with him risks and complications of not having the cast. He understands this. We will do a cam boot. He hater to stay nonwbearing. I discussed with him sequela Charcot including ulceration, foot deformity, loss of leg. Discussed possible surgical intervention however given his medical conditions we will continue conservative treatment for now and the patient agreement to this and let hold off on any surgical intervention as well. -Follow-up in 3 weeks or sooner if any problems arise. In the meantime, encouraged to call the office with any questions, concerns, change in symptoms.   Ovid CurdMatthew Admiral Marcucci, DPM

## 2016-04-12 ENCOUNTER — Ambulatory Visit: Payer: Medicare HMO | Admitting: Student

## 2016-04-12 DIAGNOSIS — I2510 Atherosclerotic heart disease of native coronary artery without angina pectoris: Secondary | ICD-10-CM | POA: Diagnosis not present

## 2016-04-12 DIAGNOSIS — I130 Hypertensive heart and chronic kidney disease with heart failure and stage 1 through stage 4 chronic kidney disease, or unspecified chronic kidney disease: Secondary | ICD-10-CM | POA: Diagnosis not present

## 2016-04-12 DIAGNOSIS — E1122 Type 2 diabetes mellitus with diabetic chronic kidney disease: Secondary | ICD-10-CM | POA: Diagnosis not present

## 2016-04-12 DIAGNOSIS — I5043 Acute on chronic combined systolic (congestive) and diastolic (congestive) heart failure: Secondary | ICD-10-CM | POA: Diagnosis not present

## 2016-04-12 DIAGNOSIS — N183 Chronic kidney disease, stage 3 (moderate): Secondary | ICD-10-CM | POA: Diagnosis not present

## 2016-04-12 DIAGNOSIS — I214 Non-ST elevation (NSTEMI) myocardial infarction: Secondary | ICD-10-CM | POA: Diagnosis not present

## 2016-04-12 DIAGNOSIS — M14671 Charcot's joint, right ankle and foot: Secondary | ICD-10-CM | POA: Diagnosis not present

## 2016-04-12 MED FILL — raNITIdine HCL 150 MG TABS: 150 | 30 days supply | Qty: 60 | Fill #4

## 2016-04-14 ENCOUNTER — Ambulatory Visit (INDEPENDENT_AMBULATORY_CARE_PROVIDER_SITE_OTHER): Payer: Medicare HMO | Admitting: Student

## 2016-04-14 ENCOUNTER — Encounter: Payer: Self-pay | Admitting: Student

## 2016-04-14 VITALS — BP 108/61 | HR 71 | Ht 66.0 in | Wt 189.0 lb

## 2016-04-14 DIAGNOSIS — E785 Hyperlipidemia, unspecified: Secondary | ICD-10-CM

## 2016-04-14 DIAGNOSIS — I5032 Chronic diastolic (congestive) heart failure: Secondary | ICD-10-CM | POA: Diagnosis not present

## 2016-04-14 DIAGNOSIS — I25118 Atherosclerotic heart disease of native coronary artery with other forms of angina pectoris: Secondary | ICD-10-CM | POA: Diagnosis not present

## 2016-04-14 DIAGNOSIS — Z79899 Other long term (current) drug therapy: Secondary | ICD-10-CM | POA: Diagnosis not present

## 2016-04-14 DIAGNOSIS — I10 Essential (primary) hypertension: Secondary | ICD-10-CM | POA: Diagnosis not present

## 2016-04-14 MED ORDER — LOSARTAN POTASSIUM 25 MG PO TABS: 12.5000 mg | ORAL_TABLET | Freq: Every day | ORAL | 5 refills | Status: DC

## 2016-04-14 MED ORDER — ISOSORBIDE MONONITRATE ER 120 MG PO TB24: 120.0000 mg | ORAL_TABLET | Freq: Two times a day (BID) | ORAL | 6 refills | Status: DC

## 2016-04-14 MED FILL — ISOSORBIDE MN 120 MG TAB SA: 120 | 15 days supply | Qty: 30 | Fill #0

## 2016-04-14 NOTE — Patient Instructions (Signed)
Medication Instructions:  DECREASE losartan to 12.5 mg (1/2 tablet) one time daily.  INCREASE isosorbide to 120 mg (1 tablet) one time in the morning and 120 mg (one tablet) in the evening.  Labwork: Have labwork today (BMET)  Testing/Procedures: NONE  Follow-Up: Your physician wants you to follow-up in: 3 MONTHS WITH DR. CRENSHAW. You will receive a reminder letter in the mail two months in advance. If you don't receive a letter, please call our office to schedule the follow-up appointment.      If you need a refill on your cardiac medications before your next appointment, please call your pharmacy.

## 2016-04-14 NOTE — Progress Notes (Signed)
Cardiology Office Note    Date:  04/14/2016   ID:  Timothy Alvarez, DOB [birth year], MRN [id number]  PCP:  Mackie Pai, PA-C  Cardiologist: Dr. Stanford Breed   Chief Complaint  Patient presents with  . Follow-up  . Shortness of Breath    physical exertion  . Dizziness    occassionally.    History of Present Illness:    Timothy Alvarez is a 61 y.o. male with past medical history of multivessel CAD (severe diffuse CAD by cath in 03/2015, declined CABG in the past), IDDM, chronic diastolic CHF (EF 94-49% by echo in 03/2015), HTN, and HLD who presents to the office today for hospital follow-up.   Was recently admitted from 1/13 - 03/30/2016 for an NSTEMI with peak troponin values of 0.99. Medical management was pursued as PCI was thought to be high-risk at the time of his last cath in 03/2015. He was treated with Heparin for 48 hours and continued on ASA, Plavix, and statin therapy. He was also diagnosed with Influenza A during admission and was treated with a 7-day course of Tamiflu.   In talking with the patient today, he reports improvement in his symptoms since hospital discharge. He denies any repeat episodes of chest discomfort that has been experiencing worsening dyspnea with exertion. Reports after going home he was very fatigued and had body aches for several more days but symptoms resolved this past weekend.   He reports good compliance with his Lasix 77m AM/234mPM, with weights remaining stable (have actually declined over the past two weeks due to his decreased appetite with residual flu symptoms last week). Denies any lower extremity edema, orthopnea, or PND.   Does report episodic dizziness first thing in the morning. Denies any presyncope or falls.   Past Medical History:  Diagnosis Date  . CAD (coronary artery disease)    a. 03/2015: cath showing severe diffuse CAD along LAD, LCx, and distal RCA. Patient declined CABG with medical management recommended.  . Cataracts,  bilateral   . CHF (congestive heart failure) ([facility]  . Chicken pox   . Contrast dye induced nephropathy   . Diabetes type 1, uncontrolled ([facility]  . Diabetic retinopathy ([facility]  . Frequent headaches   . Heart attack 09.13.2014   x5  . Hyperlipidemia   . Measles, Ge[place])   . Mumps   . Orthostatic hypotension   . OSA (obstructive sleep apnea) 07/17/2013  . Scarlet fever     Past Surgical History:  Procedure Laterality Date  . APPENDECTOMY  1966  . CARDIAC CATHETERIZATION N/A 03/18/2015   Procedure: Left Heart Cath and Coronary Angiography;  Surgeon: HeBelva CromeMD;  [facility];  Service: Cardiovascular;  Laterality: N/A;  . CORONARY ANGIOPLASTY WITH STENT PLACEMENT    . REFRACTIVE SURGERY     Retinopathy  . WISDOM TOOTH EXTRACTION      Current Medications: Outpatient Medications Prior to Visit  Medication Sig Dispense Refill  . acetaminophen (TYLENOL) 325 MG tablet Take 650 mg by mouth every 6 (six) hours as needed for mild pain.    . Marland Kitchenspirin 81 MG tablet Take 81 mg by mouth daily.    . benzonatate (TESSALON) 100 MG capsule Take 1 capsule (100 mg total) by mouth 2 (two) times daily as needed for cough. 30 capsule 0  . brimonidine-timolol (COMBIGAN) 0.2-0.5 % ophthalmic solution Place 1 drop into the right eye every 12 (twelve) hours.    . brinzolamide (  AZOPT) 1 % ophthalmic suspension Place 1 drop into the right eye 3 (three) times daily.     . clopidogrel (PLAVIX) 75 MG tablet Take 1 tablet (75 mg total) by mouth daily. 90 tablet 3  . co-enzyme Q-10 30 MG capsule Take 30 mg by mouth daily.     . fludrocortisone (FLORINEF) 0.1 MG tablet TAKE 1 TABLET BY MOUTH TWICE DAILY (Patient taking differently: TAKE 0.1 mg BY MOUTH TWICE DAILY) 60 tablet 3  . furosemide (LASIX) 20 MG tablet 2 tablets in the morning and 1 tablet in the afternoon (Patient taking differently: Take 20-40 mg by mouth See admin instructions. 40 mg in the morning and 20 mg in the afternoon)  270 tablet 3  . gabapentin (NEURONTIN) 600 MG tablet TAKE 1 TABLET BY MOUTH 3 TIMES DAILY. (Patient taking differently: TAKE 600 mg BY MOUTH 3 TIMES DAILY.) 90 tablet 2  . HYDROcodone-acetaminophen (NORCO/VICODIN) 5-325 MG tablet Take 1 tablet by mouth every 6 (six) hours as needed for severe pain. 60 tablet 0  . insulin lispro (HUMALOG) 100 UNIT/ML injection as directed. Use in Insulin Pump, Max daily dose: 100 units/day    . latanoprost (XALATAN) 0.005 % ophthalmic solution Place 1 drop into both eyes at bedtime.    . meclizine (ANTIVERT) 12.5 MG tablet Take 1 tablet (12.5 mg total) by mouth 3 (three) times daily as needed for dizziness. 30 tablet 0  . midodrine (PROAMATINE) 10 MG tablet TAKE 1 TABLET BY MOUTH TWICE DAILY (Patient taking differently: TAKE 10 mg BY MOUTH TWICE DAILY) 60 tablet 3  . nitroGLYCERIN (NITROSTAT) 0.4 MG SL tablet Place 1 tablet (0.4 mg total) under the tongue every 5 (five) minutes x 3 doses as needed for chest pain. 25 tablet 12  . potassium chloride SA (K-DUR,KLOR-CON) 20 MEQ tablet Take 1 tablet (20 mEq total) by mouth daily. 30 tablet 6  . Probiotic CAPS Take 1 capsule by mouth every morning.    . ranitidine (ZANTAC) 150 MG tablet TAKE 1 TABLET BY MOUTH TWICE DAILY (Patient taking differently: TAKE 150 mg BY MOUTH TWICE DAILY) 60 tablet 5  . rosuvastatin (CRESTOR) 40 MG tablet Take 40 mg by mouth daily.    . SENEXON-S 8.6-50 MG tablet TAKE 1 TABLET BY MOUTH DAILY. 100 tablet 1  . zolpidem (AMBIEN CR) 12.5 MG CR tablet Take 1 tablet (12.5 mg total) by mouth at bedtime. 30 tablet 2  . isosorbide mononitrate (IMDUR) 120 MG 24 hr tablet Take 1 tablet (120 mg total) by mouth daily. 30 tablet 6  . losartan (COZAAR) 25 MG tablet Take 1 tablet (25 mg total) by mouth daily. 30 tablet 5  . glucagon 1 MG injection Inject 1 mg into the vein once as needed (Emergency Test Kit). Reported on 04/07/2015    . oseltamivir (TAMIFLU) 30 MG capsule Take 1 capsule (30 mg total) by mouth 2  (two) times daily. (Patient not taking: Reported on 04/14/2016) 7 capsule 0   No facility-administered medications prior to visit.      Allergies:   Contrast media [iodinated diagnostic agents]; Metrizamide; and Lisinopril   Social History   Social History  . Marital status: Married    Spouse name: N/A  . Number of children: 1  . Years of education: N/A   Social History Main Topics  . Smoking status: Never Smoker  . Smokeless tobacco: Never Used  . Alcohol use Yes     Comment: Rare  . Drug use: No  . Sexual  activity: Not Asked   Other Topics Concern  . 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  They have one grown daughter.   Highest level of education:  Master's Degree     Family History:  The patient's family history includes Breast cancer in his sister; Healthy in his brother; Heart disease in his sister; Hypertension (age of onset: 47) in his mother; Liver cancer in his paternal grandfather; Other in his mother; Transient ischemic attack in his father; Vascular Disease (age of onset: 44) in his father.   Review of Systems:   Please see the history of present illness.     General:  No chills, fever, night sweats or weight changes.  Cardiovascular:  No chest pain, edema, orthopnea, palpitations, paroxysmal nocturnal dyspnea. Positive for dyspnea on exertion.  Dermatological: No rash, lesions/masses Respiratory: No cough, dyspnea Urologic: No hematuria, dysuria Abdominal:   No nausea, vomiting, diarrhea, bright red blood per rectum, melena, or hematemesis Neurologic:  No visual changes, wkns, changes in mental status. Positive for dizziness.  All other systems reviewed and are otherwise negative except as noted above.   Physical Exam:    VS:  BP 108/61   Pulse 71   Ht _0  (1.676 m)   Wt 189 lb (85.7 kg)   BMI 30.51 kg/m    General: Well developed, well nourished Caucasian male appearing in no acute  distress. Head: Normocephalic, atraumatic, sclera non-icteric, no xanthomas, nares are without discharge.  Neck: No carotid bruits. JVD not elevated.  Lungs: Respirations regular and unlabored, without wheezes or rales.  Heart: Regular rate and rhythm. No S3 or S4.  No murmur, no rubs, or gallops appreciated. Abdomen: Soft, non-tender, non-distended with normoactive bowel sounds. No hepatomegaly. No rebound/guarding. No obvious abdominal masses. Msk:  Strength and tone appear normal for age. No joint deformities or effusions. Extremities: No clubbing or cyanosis. No edema.  Distal pedal pulses are 2+ bilaterally. Right foot in Cam boot. Neuro: Alert and oriented X 3. Moves all extremities spontaneously. No focal deficits noted. Psych:  Responds to questions appropriately with a normal affect. Skin: No rashes or lesions noted  Wt Readings from Last 3 Encounters:  04/14/16 189 lb (85.7 kg)  03/30/16 195 lb 8 oz (88.7 kg)  03/23/16 194 lb 12.8 oz (88.4 kg)     Studies/Labs Reviewed:   EKG:  EKG is not ordered today.   Recent Labs: 02/17/2016: ALT 67; Pro B Natriuretic peptide (BNP) 695.0 03/26/2016: B Natriuretic Peptide 251.7 03/28/2016: Hemoglobin 12.3; Platelets 187 03/30/2016: BUN 19; Creatinine, Ser 1.40; Potassium 3.6; Sodium 140   Lipid Panel    Component Value Date/Time   CHOL 108 03/27/2016 0541   TRIG 42 03/27/2016 0541   HDL 51 03/27/2016 0541   CHOLHDL 2.1 03/27/2016 0541   VLDL 8 03/27/2016 0541   LDLCALC 49 03/27/2016 0541    Additional studies/ records that were reviewed today include:   Cardiac Catheterization: 03/18/2015 1. Mid RCA lesion, 30% stenosed. The lesion was previously treated with a stent (unknown type). 2. Mid RCA to Dist RCA lesion, 50% stenosed. 3. 1st RPLB lesion, 90% stenosed. 4. RPDA-2 lesion, 60% stenosed. 5. RPDA-1 lesion, 65% stenosed. 6. Ost RCA to Prox RCA lesion, 50% stenosed. 7. Ost 1st Mrg to 1st Mrg lesion, 95% stenosed. 8. 1st Mrg  lesion, 75% stenosed. 9. 1st Diag lesion, 95% stenosed. 10. Mid LAD lesion, 99% stenosed. 11. Dist  LAD lesion, 90% stenosed. 20. Ost LAD to Prox LAD lesion, 75% stenosed.    Severe diffuse LAD, and circumflex coronary artery disease. Diffuse disease in the distal RCA.  Patent mid right coronary stent.  Left ventriculography was not performed.LVEDP is 24 mmHg   Recommendations:   Continue aggressive medical therapy. If the patient has never formally had a surgical consultation for CABG, it would be appropriate to get a surgical opinion at this time. The diffuse nature of the patient's coronary disease makes PCI high risk and unlikely to be of benefit. This could also make the patient non-surgical (inadequate targets for grafting).  Assessment:    1. Coronary artery disease involving native coronary artery of native heart with other form of angina pectoris ([place])   2. Chronic diastolic heart failure ([place])   3. Essential hypertension   4. Hyperlipidemia LDL goal <70   5. Medication management      Plan:   In order of problems listed above:  1. CAD - severe diffuse CAD by cath in 03/2015, PCI was thought to be high-risk at the time of his last cath and not felt to be a good CABG candidate secondary to poor targets.  - recently admitted for an NSTEMI in the setting of Influenza A. Repeat cath not pursued as it would not have changed the course of treatment.  - he has been experiencing worsening dyspnea with exertion with occasional episodes of chest discomfort, but says the dyspnea is his biggest issue.  - continue ASA, Plavix, and statin. Unable to tolerate BB therapy secondary to bradycardia in the past. Although dyspnea is a bigger issue for Mr. Granade as compared to his chest discomfort, will try increasing Imdur to 120 mg twice a day to see if this helps improve his symptoms. If no significant changes, was instructed to resume 120 mg once daily.  2. Chronic Diastolic CHF - EF  97-67% by echo in 03/2015. Does not appear volume overloaded on physical exam. Weights have remained stable.  - continue Lasix at 20m AM/278mPM. Will recheck BMET. If creatinine elevated, go back to prior dosing of 2026mID.   3. HTN - BP at 108/61 during today's visit.  - reports episodes of dizziness with standing, likely consistent with orthostatic hypotension. -  will reduce Losartan (58m18mily to 12.5mg 1mly) to allow for increase in Imdur to see if this will help his chest discomfort and dyspnea with exertion.   4. HLD - Lipid Panel on 03/27/2016 showed total cholesterol 108, HDL 51, and LDL 49. At goal of LDL < 70. - Continue Crestor 40mg 55my.  Medication Adjustments/Labs and Tests Ordered: Current medicines are reviewed at length with the patient today.  Concerns regarding medicines are outlined above.  Medication changes, Labs and Tests ordered today are listed in the Patient Instructions below. Patient Instructions  Medication Instructions:  DECREASE losartan to 12.5 mg (1/2 tablet) one time daily.  INCREASE isosorbide to 120 mg (1 tablet) one time in the morning and 120 mg (one tablet) in the evening.  Labwork: Have labwork today (BMET)  Testing/Procedures: NONE  Follow-Up: Your physician wants you to follow-up in: 3 MONTHS WITH DR. CRENSHAW. You will receive a reminder letter in the mail two months in advance. If you don't receive a letter, please call our office to schedule the follow-up appointment   If you need a refill on your cardiac medications before your next appointment, please call your pharmacy.   Signed, BrittaErma Heritage  PA  04/14/2016 5:39 PM    [facility] [address], [place], [place]  [address] Phone: [phone]; Fax: [phone]  [address] [place], [place] [address] Phone: [phone]

## 2016-04-15 ENCOUNTER — Telehealth: Payer: Self-pay | Admitting: Student

## 2016-04-15 DIAGNOSIS — I5043 Acute on chronic combined systolic (congestive) and diastolic (congestive) heart failure: Secondary | ICD-10-CM | POA: Diagnosis not present

## 2016-04-15 DIAGNOSIS — I130 Hypertensive heart and chronic kidney disease with heart failure and stage 1 through stage 4 chronic kidney disease, or unspecified chronic kidney disease: Secondary | ICD-10-CM | POA: Diagnosis not present

## 2016-04-15 DIAGNOSIS — E1122 Type 2 diabetes mellitus with diabetic chronic kidney disease: Secondary | ICD-10-CM | POA: Diagnosis not present

## 2016-04-15 DIAGNOSIS — M14671 Charcot's joint, right ankle and foot: Secondary | ICD-10-CM | POA: Diagnosis not present

## 2016-04-15 DIAGNOSIS — R69 Illness, unspecified: Secondary | ICD-10-CM | POA: Diagnosis not present

## 2016-04-15 DIAGNOSIS — N183 Chronic kidney disease, stage 3 (moderate): Secondary | ICD-10-CM | POA: Diagnosis not present

## 2016-04-15 DIAGNOSIS — I2510 Atherosclerotic heart disease of native coronary artery without angina pectoris: Secondary | ICD-10-CM | POA: Diagnosis not present

## 2016-04-15 DIAGNOSIS — I214 Non-ST elevation (NSTEMI) myocardial infarction: Secondary | ICD-10-CM | POA: Diagnosis not present

## 2016-04-15 LAB — BASIC METABOLIC PANEL
BUN: 15 mg/dL (ref 7–25)
CALCIUM: 8.9 mg/dL (ref 8.6–10.3)
CO2: 24 mmol/L (ref 20–31)
CREATININE: 1.41 mg/dL — AB (ref 0.70–1.25)
Chloride: 103 mmol/L (ref 98–110)
Glucose, Bld: 214 mg/dL — ABNORMAL HIGH (ref 65–99)
Potassium: 4.1 mmol/L (ref 3.5–5.3)
Sodium: 140 mmol/L (ref 135–146)

## 2016-04-15 MED ORDER — FUROSEMIDE 20 MG PO TABS: ORAL_TABLET | ORAL | 3 refills | Status: DC

## 2016-04-15 MED FILL — FREESTYLE TEST STRIPS: 23 days supply | Qty: 200 | Fill #3

## 2016-04-15 NOTE — Telephone Encounter (Signed)
Pt has been made aware of his lab results. He has been advised to reduce lasix to 20 bid and taking 1 extra dose if wt increases 3 lbs overnight or 5 lbs in a week. Pt agreeable with this plan and verbalized understanding.

## 2016-04-15 NOTE — Telephone Encounter (Signed)
New message ° ° ° ° °Returning a call to the nurse to get lab results °

## 2016-04-15 NOTE — Telephone Encounter (Signed)
-----   Message from OlivarezBrittany M Strader, GeorgiaPA sent at 04/15/2016  7:58 AM EST ----- Creatinine has ranged between 1.1-1.5 within the past year, at 1.41 on most recent check. Can reduce Lasix back to 20mg  BID. With going back to this dosing, please remind him to weigh himself daily and if weight goes up > 3 lbs overnight or > 5 lbs in one week, then to take an additional Lasix tablet daily until weight approaches baseline. Thank you!

## 2016-04-18 ENCOUNTER — Telehealth: Payer: Self-pay | Admitting: Physician Assistant

## 2016-04-18 NOTE — Telephone Encounter (Signed)
Refill of neurontin rx'd.

## 2016-04-18 NOTE — Telephone Encounter (Signed)
Will defer further refills of patient's medications to PCP  

## 2016-04-19 MED FILL — GABAPENTIN 600 MG TABLET: 600 | 30 days supply | Qty: 90 | Fill #0

## 2016-04-21 ENCOUNTER — Telehealth: Payer: Self-pay | Admitting: Cardiology

## 2016-04-21 MED FILL — DUREZOL 0.05% EYE DROPS: 0.05 | 30 days supply | Qty: 5 | Fill #0

## 2016-04-21 MED FILL — ATROPINE 1% EYE DROPS: 1 | 84 days supply | Qty: 15 | Fill #0

## 2016-04-21 MED FILL — CIPROFLOXACIN 0.3% EYE DROP: 0.3 | 30 days supply | Qty: 5 | Fill #0

## 2016-04-21 NOTE — Telephone Encounter (Signed)
Clearance routed via EPIC to ophthalmologist

## 2016-04-21 NOTE — Telephone Encounter (Signed)
New message    Request for surgical clearance:  1. What type of surgery is being performed? Glaucoma    2. When is this surgery scheduled? 2.12.2018   3. Are there any medications that need to be held prior to surgery and how long? Blood thinnner   4. Name of physician performing surgery? Dr. Weyman Rodneyadinochenko   5. What is your office phone and fax number? [phone] / fas [phone]

## 2016-04-21 NOTE — Telephone Encounter (Signed)
Message routed to Dr. Jens Somrenshaw

## 2016-04-21 NOTE — Telephone Encounter (Signed)
Ok to hold plavix for eye procedure Timothy MillersBrian Tesa Alvarez

## 2016-04-22 ENCOUNTER — Telehealth: Payer: Self-pay | Admitting: Medical

## 2016-04-22 NOTE — Telephone Encounter (Signed)
Caller name: Verionca  Relation to pt: [employer]  Call back number: Prior Berkley Harveyuth  [phone] fax # [phone]   Reason for call:   Patient requesting omnipods and due to patient having new insurance, Monia [place]ou[place] will require pre cert. [facility] [phone]

## 2016-04-25 DIAGNOSIS — H4061X0 Glaucoma secondary to drugs, right eye, stage unspecified: Secondary | ICD-10-CM | POA: Diagnosis not present

## 2016-04-25 DIAGNOSIS — H4061X4 Glaucoma secondary to drugs, right eye, indeterminate stage: Secondary | ICD-10-CM | POA: Diagnosis not present

## 2016-04-25 NOTE — Telephone Encounter (Signed)
Pt has prior auth request and number for omnipods. Pt has endocrinologist at [facility]. So he has been seeing them and diabetic supplies related to insulin pump in my opinion should be handled by specialist. They were the one to write the original rx for omnipod. They know history and can justify use. So have they contacted the endocrinologist office?  Let me know what they say.

## 2016-04-25 NOTE — Telephone Encounter (Signed)
Attempt to reach [employer] pt via phone, on hold too long [patient care], will try again tomorrow to inform insurance that request needs to go through patient's Endocrinologist per provider [[facility]]/SLS 02/12

## 2016-04-25 NOTE — Telephone Encounter (Signed)
[employer] CorporationCalled [employer]

## 2016-04-26 ENCOUNTER — Ambulatory Visit: Payer: Medicare Other | Admitting: Podiatry

## 2016-04-26 NOTE — Telephone Encounter (Signed)
Having to leave at half-day today; Timothy Alvarez will try to F/U on and/or I will f/u on tomorrow when returning to office/SLS 02/13

## 2016-04-26 NOTE — Telephone Encounter (Signed)
Patient checking on the status of message below, informed patient to reach out to Endo patient voice understanding.

## 2016-04-29 ENCOUNTER — Telehealth: Payer: Self-pay | Admitting: Cardiology

## 2016-04-29 ENCOUNTER — Encounter (HOSPITAL_BASED_OUTPATIENT_CLINIC_OR_DEPARTMENT_OTHER): Payer: Self-pay | Admitting: *Deleted

## 2016-04-29 ENCOUNTER — Inpatient Hospital Stay (HOSPITAL_BASED_OUTPATIENT_CLINIC_OR_DEPARTMENT_OTHER)
Admission: EM | Admit: 2016-04-29 | Discharge: 2016-05-05 | DRG: 291 | Disposition: A | Discharge status: Home Health | Payer: Medicare HMO | Attending: Internal Medicine | Admitting: Internal Medicine

## 2016-04-29 ENCOUNTER — Emergency Department (HOSPITAL_BASED_OUTPATIENT_CLINIC_OR_DEPARTMENT_OTHER): Payer: Medicare HMO

## 2016-04-29 DIAGNOSIS — Z8619 Personal history of other infectious and parasitic diseases: Secondary | ICD-10-CM

## 2016-04-29 DIAGNOSIS — E1022 Type 1 diabetes mellitus with diabetic chronic kidney disease: Secondary | ICD-10-CM | POA: Diagnosis not present

## 2016-04-29 DIAGNOSIS — N189 Chronic kidney disease, unspecified: Secondary | ICD-10-CM

## 2016-04-29 DIAGNOSIS — I214 Non-ST elevation (NSTEMI) myocardial infarction: Secondary | ICD-10-CM | POA: Diagnosis not present

## 2016-04-29 DIAGNOSIS — R0600 Dyspnea, unspecified: Secondary | ICD-10-CM

## 2016-04-29 DIAGNOSIS — Z955 Presence of coronary angioplasty implant and graft: Secondary | ICD-10-CM

## 2016-04-29 DIAGNOSIS — Z9861 Coronary angioplasty status: Secondary | ICD-10-CM | POA: Diagnosis not present

## 2016-04-29 DIAGNOSIS — H269 Unspecified cataract: Secondary | ICD-10-CM | POA: Diagnosis present

## 2016-04-29 DIAGNOSIS — Z6831 Body mass index (BMI) 31.0-31.9, adult: Secondary | ICD-10-CM | POA: Diagnosis not present

## 2016-04-29 DIAGNOSIS — Z79899 Other long term (current) drug therapy: Secondary | ICD-10-CM

## 2016-04-29 DIAGNOSIS — R748 Abnormal levels of other serum enzymes: Secondary | ICD-10-CM | POA: Diagnosis present

## 2016-04-29 DIAGNOSIS — R0602 Shortness of breath: Secondary | ICD-10-CM | POA: Diagnosis not present

## 2016-04-29 DIAGNOSIS — I509 Heart failure, unspecified: Secondary | ICD-10-CM | POA: Diagnosis not present

## 2016-04-29 DIAGNOSIS — E8770 Fluid overload, unspecified: Secondary | ICD-10-CM | POA: Diagnosis not present

## 2016-04-29 DIAGNOSIS — E871 Hypo-osmolality and hyponatremia: Secondary | ICD-10-CM | POA: Diagnosis present

## 2016-04-29 DIAGNOSIS — I951 Orthostatic hypotension: Secondary | ICD-10-CM | POA: Diagnosis present

## 2016-04-29 DIAGNOSIS — E876 Hypokalemia: Secondary | ICD-10-CM

## 2016-04-29 DIAGNOSIS — E10610 Type 1 diabetes mellitus with diabetic neuropathic arthropathy: Secondary | ICD-10-CM | POA: Diagnosis not present

## 2016-04-29 DIAGNOSIS — R339 Retention of urine, unspecified: Secondary | ICD-10-CM | POA: Diagnosis present

## 2016-04-29 DIAGNOSIS — Z7982 Long term (current) use of aspirin: Secondary | ICD-10-CM

## 2016-04-29 DIAGNOSIS — I2510 Atherosclerotic heart disease of native coronary artery without angina pectoris: Secondary | ICD-10-CM

## 2016-04-29 DIAGNOSIS — Z9641 Presence of insulin pump (external) (internal): Secondary | ICD-10-CM | POA: Diagnosis present

## 2016-04-29 DIAGNOSIS — Z91041 Radiographic dye allergy status: Secondary | ICD-10-CM

## 2016-04-29 DIAGNOSIS — E669 Obesity, unspecified: Secondary | ICD-10-CM | POA: Diagnosis present

## 2016-04-29 DIAGNOSIS — I5023 Acute on chronic systolic (congestive) heart failure: Secondary | ICD-10-CM | POA: Diagnosis not present

## 2016-04-29 DIAGNOSIS — I5043 Acute on chronic combined systolic (congestive) and diastolic (congestive) heart failure: Secondary | ICD-10-CM | POA: Diagnosis not present

## 2016-04-29 DIAGNOSIS — R778 Other specified abnormalities of plasma proteins: Secondary | ICD-10-CM

## 2016-04-29 DIAGNOSIS — J189 Pneumonia, unspecified organism: Secondary | ICD-10-CM | POA: Diagnosis present

## 2016-04-29 DIAGNOSIS — E785 Hyperlipidemia, unspecified: Secondary | ICD-10-CM | POA: Diagnosis present

## 2016-04-29 DIAGNOSIS — Z888 Allergy status to other drugs, medicaments and biological substances status: Secondary | ICD-10-CM

## 2016-04-29 DIAGNOSIS — I130 Hypertensive heart and chronic kidney disease with heart failure and stage 1 through stage 4 chronic kidney disease, or unspecified chronic kidney disease: Principal | ICD-10-CM | POA: Diagnosis present

## 2016-04-29 DIAGNOSIS — N183 Chronic kidney disease, stage 3 (moderate): Secondary | ICD-10-CM | POA: Diagnosis not present

## 2016-04-29 DIAGNOSIS — R7989 Other specified abnormal findings of blood chemistry: Secondary | ICD-10-CM

## 2016-04-29 DIAGNOSIS — G4733 Obstructive sleep apnea (adult) (pediatric): Secondary | ICD-10-CM | POA: Diagnosis present

## 2016-04-29 DIAGNOSIS — I10 Essential (primary) hypertension: Secondary | ICD-10-CM | POA: Diagnosis present

## 2016-04-29 DIAGNOSIS — D649 Anemia, unspecified: Secondary | ICD-10-CM | POA: Diagnosis not present

## 2016-04-29 DIAGNOSIS — Z7902 Long term (current) use of antithrombotics/antiplatelets: Secondary | ICD-10-CM

## 2016-04-29 DIAGNOSIS — N179 Acute kidney failure, unspecified: Secondary | ICD-10-CM | POA: Diagnosis not present

## 2016-04-29 DIAGNOSIS — I5042 Chronic combined systolic (congestive) and diastolic (congestive) heart failure: Secondary | ICD-10-CM | POA: Insufficient documentation

## 2016-04-29 DIAGNOSIS — Z803 Family history of malignant neoplasm of breast: Secondary | ICD-10-CM

## 2016-04-29 DIAGNOSIS — Z8249 Family history of ischemic heart disease and other diseases of the circulatory system: Secondary | ICD-10-CM

## 2016-04-29 DIAGNOSIS — Z800 Family history of malignant neoplasm of digestive organs: Secondary | ICD-10-CM

## 2016-04-29 DIAGNOSIS — S92301A Fracture of unspecified metatarsal bone(s), right foot, initial encounter for closed fracture: Secondary | ICD-10-CM | POA: Diagnosis not present

## 2016-04-29 DIAGNOSIS — S93324D Dislocation of tarsometatarsal joint of right foot, subsequent encounter: Secondary | ICD-10-CM | POA: Diagnosis not present

## 2016-04-29 DIAGNOSIS — I252 Old myocardial infarction: Secondary | ICD-10-CM | POA: Diagnosis not present

## 2016-04-29 DIAGNOSIS — E10319 Type 1 diabetes mellitus with unspecified diabetic retinopathy without macular edema: Secondary | ICD-10-CM | POA: Diagnosis present

## 2016-04-29 DIAGNOSIS — S300XXA Contusion of lower back and pelvis, initial encounter: Secondary | ICD-10-CM | POA: Diagnosis not present

## 2016-04-29 DIAGNOSIS — Z794 Long term (current) use of insulin: Secondary | ICD-10-CM

## 2016-04-29 DIAGNOSIS — M14671 Charcot's joint, right ankle and foot: Secondary | ICD-10-CM | POA: Diagnosis not present

## 2016-04-29 LAB — CBC WITH DIFFERENTIAL/PLATELET
BASOS ABS: 0 10*3/uL (ref 0.0–0.1)
BASOS PCT: 1 %
EOS ABS: 0.4 10*3/uL (ref 0.0–0.7)
EOS PCT: 7 %
HCT: 35.9 % — ABNORMAL LOW (ref 39.0–52.0)
HEMOGLOBIN: 11.7 g/dL — AB (ref 13.0–17.0)
LYMPHS ABS: 1.2 10*3/uL (ref 0.7–4.0)
Lymphocytes Relative: 23 %
MCH: 28.5 pg (ref 26.0–34.0)
MCHC: 32.6 g/dL (ref 30.0–36.0)
MCV: 87.3 fL (ref 78.0–100.0)
Monocytes Absolute: 0.5 10*3/uL (ref 0.1–1.0)
Monocytes Relative: 10 %
NEUTROS PCT: 59 %
Neutro Abs: 3.2 10*3/uL (ref 1.7–7.7)
PLATELETS: 193 10*3/uL (ref 150–400)
RBC: 4.11 MIL/uL — AB (ref 4.22–5.81)
RDW: 14.5 % (ref 11.5–15.5)
WBC: 5.3 10*3/uL (ref 4.0–10.5)

## 2016-04-29 LAB — BASIC METABOLIC PANEL
Anion gap: 8 (ref 5–15)
BUN: 19 mg/dL (ref 6–20)
CO2: 29 mmol/L (ref 22–32)
CREATININE: 1.15 mg/dL (ref 0.61–1.24)
Calcium: 8.4 mg/dL — ABNORMAL LOW (ref 8.9–10.3)
Chloride: 104 mmol/L (ref 101–111)
Glucose, Bld: 96 mg/dL (ref 65–99)
POTASSIUM: 3.6 mmol/L (ref 3.5–5.1)
SODIUM: 141 mmol/L (ref 135–145)

## 2016-04-29 LAB — URINALYSIS, ROUTINE W REFLEX MICROSCOPIC
Bilirubin Urine: NEGATIVE
Glucose, UA: NEGATIVE mg/dL
HGB URINE DIPSTICK: NEGATIVE
Ketones, ur: NEGATIVE mg/dL
LEUKOCYTES UA: NEGATIVE
NITRITE: NEGATIVE
PROTEIN: NEGATIVE mg/dL
SPECIFIC GRAVITY, URINE: 1.009 (ref 1.005–1.030)
pH: 7.5 (ref 5.0–8.0)

## 2016-04-29 LAB — CBC
HCT: 37.4 % — ABNORMAL LOW (ref 39.0–52.0)
HEMOGLOBIN: 12.1 g/dL — AB (ref 13.0–17.0)
MCH: 28.1 pg (ref 26.0–34.0)
MCHC: 32.4 g/dL (ref 30.0–36.0)
MCV: 87 fL (ref 78.0–100.0)
PLATELETS: 194 10*3/uL (ref 150–400)
RBC: 4.3 MIL/uL (ref 4.22–5.81)
RDW: 14.7 % (ref 11.5–15.5)
WBC: 5.9 10*3/uL (ref 4.0–10.5)

## 2016-04-29 LAB — CREATININE, SERUM
CREATININE: 1.25 mg/dL — AB (ref 0.61–1.24)
GFR calc non Af Amer: >60 mL/min (ref >60)

## 2016-04-29 LAB — TROPONIN I
TROPONIN I: 0.56 ng/mL — AB (ref <0.03)
TROPONIN I: 0.51 ng/mL — AB (ref <0.03)

## 2016-04-29 LAB — BRAIN NATRIURETIC PEPTIDE: B Natriuretic Peptide: 303.6 pg/mL — ABNORMAL HIGH (ref 0.0–100.0)

## 2016-04-29 LAB — GLUCOSE, CAPILLARY
GLUCOSE-CAPILLARY: 132 mg/dL — AB (ref 65–99)
Glucose-Capillary: 128 mg/dL — ABNORMAL HIGH (ref 65–99)

## 2016-04-29 LAB — I-STAT TROPONIN, ED: TROPONIN I, POC: 0.69 ng/mL — AB (ref 0.00–0.08)

## 2016-04-29 MED ORDER — POTASSIUM CHLORIDE CRYS ER 20 MEQ PO TBCR
20.0000 meq | EXTENDED_RELEASE_TABLET | Freq: Every day | ORAL | Status: DC
  Administered 2016-04-30 – 2016-05-05 (×6): 20 meq via ORAL
  Filled 2016-04-29 (×6): qty 1

## 2016-04-29 MED ORDER — NITROGLYCERIN 2 % TD OINT
1.0000 [in_us] | TOPICAL_OINTMENT | Freq: Once | TRANSDERMAL | Status: AC
  Administered 2016-04-29: 1 [in_us] via TOPICAL
  Filled 2016-04-29: qty 1

## 2016-04-29 MED ORDER — GABAPENTIN 600 MG PO TABS
600.0000 mg | ORAL_TABLET | Freq: Three times a day (TID) | ORAL | Status: DC
  Administered 2016-04-29 – 2016-05-05 (×18): 600 mg via ORAL
  Filled 2016-04-29 (×18): qty 1

## 2016-04-29 MED ORDER — RANOLAZINE ER 500 MG PO TB12
500.0000 mg | ORAL_TABLET | Freq: Two times a day (BID) | ORAL | Status: DC
  Administered 2016-04-29 – 2016-05-04 (×10): 500 mg via ORAL
  Filled 2016-04-29 (×9): qty 1

## 2016-04-29 MED ORDER — ISOSORBIDE MONONITRATE ER 60 MG PO TB24
120.0000 mg | ORAL_TABLET | Freq: Two times a day (BID) | ORAL | Status: DC
  Administered 2016-04-29 – 2016-05-04 (×10): 120 mg via ORAL
  Filled 2016-04-29 (×10): qty 2

## 2016-04-29 MED ORDER — INSULIN PUMP
Freq: Three times a day (TID) | SUBCUTANEOUS | Status: DC
  Administered 2016-04-29: 1 via SUBCUTANEOUS
  Filled 2016-04-29: qty 1

## 2016-04-29 MED ORDER — BRIMONIDINE TARTRATE 0.2 % OP SOLN
1.0000 [drp] | Freq: Two times a day (BID) | OPHTHALMIC | Status: DC
  Filled 2016-04-29: qty 5

## 2016-04-29 MED ORDER — ENOXAPARIN SODIUM 40 MG/0.4ML ~~LOC~~ SOLN
40.0000 mg | SUBCUTANEOUS | Status: DC
  Administered 2016-04-29 – 2016-05-02 (×4): 40 mg via SUBCUTANEOUS
  Filled 2016-04-29 (×6): qty 0.4

## 2016-04-29 MED ORDER — COENZYME Q10 30 MG PO CAPS: 30.0000 mg | ORAL_CAPSULE | Freq: Every day | ORAL | Status: DC

## 2016-04-29 MED ORDER — BRIMONIDINE TARTRATE 0.2 % OP SOLN
1.0000 [drp] | Freq: Two times a day (BID) | OPHTHALMIC | Status: DC
  Administered 2016-04-29 – 2016-05-02 (×6): 1 [drp] via OPHTHALMIC
  Filled 2016-04-29: qty 5

## 2016-04-29 MED ORDER — MIDODRINE HCL 5 MG PO TABS
10.0000 mg | ORAL_TABLET | Freq: Two times a day (BID) | ORAL | Status: DC
  Administered 2016-04-29 – 2016-05-04 (×10): 10 mg via ORAL
  Filled 2016-04-29 (×10): qty 2

## 2016-04-29 MED ORDER — LATANOPROST 0.005 % OP SOLN
1.0000 [drp] | Freq: Every day | OPHTHALMIC | Status: DC
  Administered 2016-04-29 – 2016-05-02 (×3): 1 [drp] via OPHTHALMIC
  Filled 2016-04-29: qty 2.5

## 2016-04-29 MED ORDER — FUROSEMIDE 10 MG/ML IJ SOLN: 40.0000 mg | Freq: Every day | INTRAMUSCULAR | Status: DC

## 2016-04-29 MED ORDER — ZOLPIDEM TARTRATE 5 MG PO TABS
5.0000 mg | ORAL_TABLET | Freq: Every evening | ORAL | Status: DC | PRN
  Filled 2016-04-29: qty 1

## 2016-04-29 MED ORDER — TIMOLOL MALEATE 0.5 % OP SOLN
1.0000 [drp] | Freq: Two times a day (BID) | OPHTHALMIC | Status: DC
  Administered 2016-04-29 – 2016-05-02 (×6): 1 [drp] via OPHTHALMIC
  Filled 2016-04-29: qty 5

## 2016-04-29 MED ORDER — SENNOSIDES-DOCUSATE SODIUM 8.6-50 MG PO TABS
1.0000 | ORAL_TABLET | Freq: Every day | ORAL | Status: DC
  Administered 2016-04-30 – 2016-05-05 (×6): 1 via ORAL
  Filled 2016-04-29 (×6): qty 1

## 2016-04-29 MED ORDER — FUROSEMIDE 10 MG/ML IJ SOLN
40.0000 mg | Freq: Once | INTRAMUSCULAR | Status: AC
  Administered 2016-04-29: 40 mg via INTRAVENOUS
  Filled 2016-04-29: qty 4

## 2016-04-29 MED ORDER — LOSARTAN POTASSIUM 25 MG PO TABS
12.5000 mg | ORAL_TABLET | Freq: Every day | ORAL | Status: DC
  Administered 2016-04-30 – 2016-05-05 (×6): 12.5 mg via ORAL
  Filled 2016-04-29 (×6): qty 1

## 2016-04-29 MED ORDER — NITROGLYCERIN 0.4 MG SL SUBL: 0.4000 mg | SUBLINGUAL_TABLET | SUBLINGUAL | Status: DC | PRN

## 2016-04-29 MED ORDER — TIMOLOL MALEATE 0.5 % OP SOLN
1.0000 [drp] | Freq: Two times a day (BID) | OPHTHALMIC | Status: DC
  Filled 2016-04-29: qty 5

## 2016-04-29 MED ORDER — HYDROCODONE-ACETAMINOPHEN 5-325 MG PO TABS: 1.0000 | ORAL_TABLET | Freq: Four times a day (QID) | ORAL | Status: DC | PRN

## 2016-04-29 MED ORDER — FUROSEMIDE 10 MG/ML IJ SOLN
40.0000 mg | Freq: Two times a day (BID) | INTRAMUSCULAR | Status: DC
  Administered 2016-04-29 – 2016-04-30 (×2): 40 mg via INTRAVENOUS
  Filled 2016-04-29 (×2): qty 4

## 2016-04-29 MED ORDER — PROBIOTIC PO CAPS: 1.0000 | ORAL_CAPSULE | Freq: Every morning | ORAL | Status: DC

## 2016-04-29 MED ORDER — FLUDROCORTISONE ACETATE 0.1 MG PO TABS
100.0000 ug | ORAL_TABLET | Freq: Two times a day (BID) | ORAL | Status: DC
  Administered 2016-04-29 – 2016-05-04 (×10): 100 ug via ORAL
  Filled 2016-04-29 (×10): qty 1

## 2016-04-29 MED ORDER — MECLIZINE HCL 25 MG PO TABS: 12.5000 mg | ORAL_TABLET | Freq: Three times a day (TID) | ORAL | Status: DC | PRN

## 2016-04-29 MED ORDER — FAMOTIDINE 20 MG PO TABS
20.0000 mg | ORAL_TABLET | Freq: Every day | ORAL | Status: DC
  Administered 2016-04-30 – 2016-05-05 (×6): 20 mg via ORAL
  Filled 2016-04-29 (×6): qty 1

## 2016-04-29 MED ORDER — ROSUVASTATIN CALCIUM 40 MG PO TABS
40.0000 mg | ORAL_TABLET | Freq: Every day | ORAL | Status: DC
  Administered 2016-04-30 – 2016-05-05 (×6): 40 mg via ORAL
  Filled 2016-04-29 (×7): qty 1

## 2016-04-29 MED ORDER — RISAQUAD PO CAPS
1.0000 | ORAL_CAPSULE | Freq: Every day | ORAL | Status: DC
  Administered 2016-04-30 – 2016-05-05 (×6): 1 via ORAL
  Filled 2016-04-29 (×6): qty 1

## 2016-04-29 MED ORDER — ASPIRIN EC 81 MG PO TBEC
81.0000 mg | DELAYED_RELEASE_TABLET | Freq: Every day | ORAL | Status: DC
  Administered 2016-04-30 – 2016-05-05 (×6): 81 mg via ORAL
  Filled 2016-04-29 (×6): qty 1

## 2016-04-29 MED ORDER — BRIMONIDINE TARTRATE-TIMOLOL 0.2-0.5 % OP SOLN
1.0000 [drp] | Freq: Two times a day (BID) | OPHTHALMIC | Status: DC
  Filled 2016-04-29: qty 5

## 2016-04-29 MED ORDER — CLOPIDOGREL BISULFATE 75 MG PO TABS
75.0000 mg | ORAL_TABLET | Freq: Every day | ORAL | Status: DC
  Administered 2016-04-30 – 2016-05-05 (×6): 75 mg via ORAL
  Filled 2016-04-29 (×7): qty 1

## 2016-04-29 MED ORDER — ATROPINE SULFATE 1 % OP SOLN
1.0000 [drp] | Freq: Three times a day (TID) | OPHTHALMIC | Status: DC
  Administered 2016-04-29 – 2016-05-02 (×9): 1 [drp] via OPHTHALMIC
  Filled 2016-04-29: qty 2

## 2016-04-29 MED ORDER — SODIUM CHLORIDE 0.9% FLUSH
3.0000 mL | Freq: Two times a day (BID) | INTRAVENOUS | Status: DC
  Administered 2016-04-30 – 2016-05-05 (×9): 3 mL via INTRAVENOUS

## 2016-04-29 NOTE — ED Notes (Signed)
[facility] to [facility].

## 2016-04-29 NOTE — ED Notes (Signed)
[facility]. 1200 cc emptied from f/c bag.

## 2016-04-29 NOTE — Telephone Encounter (Signed)
New message     Pt c/o Shortness Of Breath: STAT if SOB developed within the last 24 hours or pt is noticeably SOB on the phone  1. Are you currently SOB (can you hear that pt is SOB on the phone)? Wife on phone (you can hear pt in background wheezing)  2. How long have you been experiencing SOB? 3 days  3. Are you SOB when sitting or when up moving around? yes  4. Are you currently experiencing any other symptoms? Leg swelling, gained 8lbs in 2 days, they have uped the lasik but it does not seem to be working

## 2016-04-29 NOTE — Telephone Encounter (Signed)
Received a call from patient's wife.She stated husband is sob can hardly walk from room to room.He has gained 8 lbs in 3 days.This is 3rd day of increasing Lasix to 40 mg twice a day and does not seem to be helping.Stated he is so weak he has fell twice this week.Advised [facility].Trish notified.

## 2016-04-29 NOTE — ED Triage Notes (Addendum)
C/o shortness of breath and fluid retention, has increased lasix 3 days ago. Denies pain. Feels weak. C/o lower left back pain after falling on Tuesday. Edema in ankles and feet. States he has gained 13 lbs since Monday.

## 2016-04-29 NOTE — Discharge Planning (Signed)
61 year old male with past medical history significant for scarlet fever, sleep apnea, hyperlipidemia, heart attack, frequent headaches, diabetic retinopathy, diabetes type 1, heart failure, coronary disease [facility] with chief complaint of shortness of breath. Patient's shortness of breath started several days ago. He has some at baseline but this is worse. Patient states he feels like his lungs were filling with fluid to the EDP. Patient states that he is up 14 pounds today compared to the day before. No cannot ambulate without extreme effort. Pt doubled lasix per cardio "a few days ago". Now on 40mg  bid.  ED Course: given 40mg  lasix. Consulted cardio and was advised to have pt to hosp svc with cardiology following as a Research scientist (medical)consultant. Troponin was found to be elevated but this appears to be chronic. BNP is pending. EKG without acute ST changes. Currently still waiting to see if there are any results of the 40 mg IV Lasix. Of note patient did take his oral Lasix this morning.  Requests: EDP agreed to try 1 inch of Nitropaste on patient for symptom relief  Dx: Fluid overload  Status: Tele , obs

## 2016-04-29 NOTE — Consult Note (Signed)
CARDIOLOGY CONSULT NOTE   Patient ID: MASTER TOUCHET MRN: [id number] DOB/AGE: 1955/08/15 61 y.o.  Admit date: 04/29/2016  Requesting Physician: Primary Physician:   Mackie Pai, PA-C Primary Cardiologist:   Dr. Kirk Ruths Reason for Consultation:   Heart Failure  HPI: Timothy Alvarez is a 61 y.o. male with a history of  multivessel CAD (severe diffuse CAD by cath in 03/2015, declined CABG in the past), IDDM, chronic diastolic CHF (EF 38-88% by echo in 03/2015), HTN, and HLD presented to [place] today for shortness of breath. The symptoms started several days ago and is worse than his baseline SOB. He has had 14 lb weight gain despite doubling on his lasix for the past couple of days but is now unable to ambulate without severe SOB, feels like his previous CHF exacerbation. In the ED he was given 40 mg lasix, he had urinary retention while in ED which required foley catheter placement with 1 L urine output. Patient transferred to [name] for admission and cardiology consult.   BNP is 303.6 Trop is 0.69 (chronically elevated) EKG without acute ST changes.   Patient reports that he now feels much improved. Not currently short of breath and not having any CP.  Past Medical History:  Diagnosis Date  . CAD (coronary artery disease)    a. 03/2015: cath showing severe diffuse CAD along LAD, LCx, and distal RCA. Patient declined CABG with medical management recommended.  . Cataracts, bilateral   . CHF (congestive heart failure) ([place])   . Chicken pox   . Contrast dye induced nephropathy   . Diabetes type 1, uncontrolled ([place])   . Diabetic retinopathy ([place])   . Frequent headaches   . Heart attack 09.13.2014   x5  . Hyperlipidemia   . Measles, [place] (rubella)   . Mumps   . Orthostatic hypotension   . OSA (obstructive sleep apnea) 07/17/2013  . Scarlet fever      Past Surgical History:  Procedure Laterality Date  . APPENDECTOMY  1966  . CARDIAC  CATHETERIZATION N/A 03/18/2015   Procedure: Left Heart Cath and Coronary Angiography;  Surgeon: Belva Crome, MD;  Location: [facility];  Service: Cardiovascular;  Laterality: N/A;  . CORONARY ANGIOPLASTY WITH STENT PLACEMENT    . REFRACTIVE SURGERY     Retinopathy  . WISDOM TOOTH EXTRACTION      Allergies  Allergen Reactions  . Contrast Media [Iodinated Diagnostic Agents] Other (See Comments)    Renal Failure  . Metrizamide Other (See Comments)    Renal Failure  . Lisinopril Cough    I have reviewed the patient's current medications . aspirin  81 mg Oral Daily  . atropine  1 drop Both Eyes TID  . brimonidine-timolol  1 drop Right Eye Q12H  . clopidogrel  75 mg Oral Daily  . co-enzyme Q-10  30 mg Oral Daily  . enoxaparin (LOVENOX) injection  40 mg Subcutaneous Q24H  . famotidine  20 mg Oral Daily  . fludrocortisone  100 mcg Oral BID  . furosemide  40 mg Intravenous Daily  . gabapentin  600 mg Oral TID  . isosorbide mononitrate  120 mg Oral BID  . latanoprost  1 drop Both Eyes QHS  . losartan  12.5 mg Oral Daily  . midodrine  10 mg Oral BID  . potassium chloride SA  20 mEq Oral Daily  . [START ON 04/30/2016] Probiotic  1 capsule Oral q morning -  10a  . rosuvastatin  40 mg Oral Daily  . senna-docusate  1 tablet Oral Daily  . sodium chloride flush  3 mL Intravenous Q12H    HYDROcodone-acetaminophen, meclizine, nitroGLYCERIN, zolpidem  Prior to Admission medications   Medication Sig Start Date End Date Taking? Authorizing Provider  aspirin 81 MG tablet Take 81 mg by mouth daily.   Yes Historical Provider, MD  atropine 1 % ophthalmic solution 3 (three) times daily.   Yes Historical Provider, MD  brimonidine-timolol (COMBIGAN) 0.2-0.5 % ophthalmic solution Place 1 drop into the right eye every 12 (twelve) hours.   Yes Historical Provider, MD  clopidogrel (PLAVIX) 75 MG tablet Take 1 tablet (75 mg total) by mouth daily. 03/30/16  Yes Erma Heritage, PA  co-enzyme Q-10  30 MG capsule Take 30 mg by mouth daily.    Yes Historical Provider, MD  fludrocortisone (FLORINEF) 0.1 MG tablet TAKE 1 TABLET BY MOUTH TWICE DAILY Patient taking differently: TAKE 0.1 mg BY MOUTH TWICE DAILY 01/28/16  Yes Brunetta Jeans, PA-C  furosemide (LASIX) 20 MG tablet 1 tablet by mouth twice a day 04/15/16  Yes Erma Heritage, PA  gabapentin (NEURONTIN) 600 MG tablet TAKE 1 TABLET BY MOUTH 3 TIMES DAILY. 04/18/16  Yes Edward Saguier, PA-C  glucagon 1 MG injection Inject 1 mg into the vein once as needed (Emergency Test Kit). Reported on 04/07/2015   Yes Historical Provider, MD  HYDROcodone-acetaminophen (NORCO/VICODIN) 5-325 MG tablet Take 1 tablet by mouth every 6 (six) hours as needed for severe pain. 04/22/15  Yes Brunetta Jeans, PA-C  insulin lispro (HUMALOG) 100 UNIT/ML injection as directed. Use in Insulin Pump, Max daily dose: 100 units/day   Yes Historical Provider, MD  isosorbide mononitrate (IMDUR) 120 MG 24 hr tablet Take 1 tablet (120 mg total) by mouth 2 (two) times daily. 04/14/16  Yes Erma Heritage, PA  latanoprost (XALATAN) 0.005 % ophthalmic solution Place 1 drop into both eyes at bedtime.   Yes Historical Provider, MD  losartan (COZAAR) 25 MG tablet Take 0.5 tablets (12.5 mg total) by mouth daily. 04/14/16  Yes Erma Heritage, PA  meclizine (ANTIVERT) 12.5 MG tablet Take 1 tablet (12.5 mg total) by mouth 3 (three) times daily as needed for dizziness. 02/27/14  Yes Edward Saguier, PA-C  midodrine (PROAMATINE) 10 MG tablet TAKE 1 TABLET BY MOUTH TWICE DAILY Patient taking differently: TAKE 10 mg BY MOUTH TWICE DAILY 01/28/16  Yes Brunetta Jeans, PA-C  nitroGLYCERIN (NITROSTAT) 0.4 MG SL tablet Place 1 tablet (0.4 mg total) under the tongue every 5 (five) minutes x 3 doses as needed for chest pain. 03/20/15  Yes Bhavinkumar Bhagat, PA  potassium chloride SA (K-DUR,KLOR-CON) 20 MEQ tablet Take 1 tablet (20 mEq total) by mouth daily. 03/31/16  Yes Erma Heritage, PA    Probiotic CAPS Take 1 capsule by mouth every morning.   Yes Historical Provider, MD  ranitidine (ZANTAC) 150 MG tablet TAKE 1 TABLET BY MOUTH TWICE DAILY Patient taking differently: TAKE 150 mg BY MOUTH TWICE DAILY 12/09/15  Yes Brunetta Jeans, PA-C  rosuvastatin (CRESTOR) 40 MG tablet Take 40 mg by mouth daily. 01/28/16  Yes Historical Provider, MD  SENEXON-S 8.6-50 MG tablet TAKE 1 TABLET BY MOUTH DAILY. 02/13/15  Yes Brunetta Jeans, PA-C  zolpidem (AMBIEN CR) 12.5 MG CR tablet Take 1 tablet (12.5 mg total) by mouth at bedtime. 03/03/16  Yes Mackie Pai, PA-C     Social History   Social  History  . Marital status: Married    Spouse name: N/A  . Number of children: 1  . Years of education: N/A   Occupational History  . Not on file.   Social History Main Topics  . Smoking status: Never Smoker  . Smokeless tobacco: Never Used  . Alcohol use Yes     Comment: Rare  . Drug use: No  . Sexual activity: Not on file   Other Topics Concern  . 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  They have one grown daughter.   Highest level of education:  Master's Degree    Family Status  Relation Status  . Mother Deceased at age 37  . Father Deceased at age 48  . Paternal Grandfather Deceased  . Paternal Grandmother Deceased  . Maternal Grandfather Deceased  . Maternal Grandmother Deceased  . Sister   . Sister   . Brother    Family History  Problem Relation Age of Onset  . Hypertension Mother 53    Deceased  . Other Mother     s/p PPM  . Vascular Disease Father 2    Deceased  . Transient ischemic attack Father   . Liver cancer Paternal Grandfather   . Breast cancer Sister   . Heart disease Sister     Ablation for rhythm disturbance  . Healthy Brother     x1    ROS:  Full 14 point review of systems complete and found to be negative unless listed above.  Physical Exam: Blood pressure (!) 147/70, pulse  76, temperature 98.4 F (36.9 C), temperature source Oral, resp. rate 18, height _0  (1.676 m), weight 194 lb 3.2 oz (88.1 kg), SpO2 100 %.  General: Well developed, well nourished, male in no acute distress Head: Eyes PERRLA, No xanthomas.   Normocephalic and atraumatic, oropharynx without edema or exudate. Dentition:  Lungs: Good airway movement. Heart: HRRR S1 S2, no rub/gallop, Heart irregular rate and rhythm with S1, S2  murmur. pulses are 2+ extrem.   Neck: No carotid bruits. No lymphadenopathy.  Abdomen: Bowel sounds present, abdomen soft and non-tender without masses or hernias noted. Msk:  No spine or cva tenderness. No weakness, no joint deformities, trace effusions. Extremities: No clubbing or cyanosis.  edema.  Neuro: Alert and oriented X 3. No focal deficits noted. Psych:  Good affect, responds appropriately Skin: No rashes or lesions noted.  Labs:  Lab Results  Component Value Date   WBC 5.3 04/29/2016   HGB 11.7 (L) 04/29/2016   HCT 35.9 (L) 04/29/2016   MCV 87.3 04/29/2016   PLT 193 04/29/2016   No results for input(s): INR in the last 72 hours.  Recent Labs Lab 04/29/16 1025  NA 141  K 3.6  CL 104  CO2 29  BUN 19  CREATININE 1.15  CALCIUM 8.4*  GLUCOSE 96   No results found for: MG No results for input(s): CKTOTAL, CKMB, TROPONINI in the last 72 hours.  Recent Labs  04/29/16 1043  TROPIPOC 0.69*   Pro B Natriuretic peptide (BNP)  Date/Time Value Ref Range Status  02/17/2016 12:45 PM 695.0 (H) 0.0 - 100.0 pg/mL Final  02/10/2016 10:30 AM 477.0 (H) 0.0 - 100.0 pg/mL Final   Lab Results  Component Value Date   CHOL 108 03/27/2016   HDL 51 03/27/2016   LDLCALC 49 03/27/2016   TRIG 42 03/27/2016  Echo:   Transthoracic Echocardiography 03/20/2015  Study Conclusions  - Left ventricle: distal septal and apical hypokinesis. The cavity   size was mildly dilated. Wall thickness was normal. Systolic   function was normal. The estimated ejection  fraction was in the   range of 50% to 55%. Doppler parameters are consistent with   elevated ventricular end-diastolic filling pressure. - Mitral valve: Severe posterior annular calcification. - Atrial septum: No defect or patent foramen ovale was identified.  Transthoracic echocardiography.  M-mode, complete 2D, spectral Doppler, and color Doppler.  Birthdate:  Patient birthdate: 08-30-1955.  Age:  Patient is 61 yr old.  Sex:  Gender: male. BMI: 32.3 kg/m^2.  Blood pressure:     132/78  Patient status: Inpatient.  Study date:  Study date: 03/20/2015. Study time: 03:27 PM.  Location:  Bedside.  ECG:  HR 69, left anterior fascicular block, nonspecific t-wave changes, no significant changes from previous EKG.    Radiology:  Dg Chest 2 View  Result Date: 04/29/2016 CLINICAL DATA:  Shortness of breath. EXAM: CHEST  2 VIEW COMPARISON:  Radiographs of March 26, 2016. FINDINGS: The heart size and mediastinal contours are within normal limits. No pneumothorax is noted. Mild central pulmonary vascular congestion is noted. Possible minimal bibasilar pulmonary edema may be present. Minimal bilateral pleural effusions are noted. The visualized skeletal structures are unremarkable. IMPRESSION: Mild central pulmonary vascular congestion. Minimal bilateral pleural effusions are noted. Possible minimal bibasilar pulmonary edema. Electronically Signed   By: Marijo Conception, M.D.   On: 04/29/2016 10:59    ASSESSMENT AND PLAN:    Active Problems:   Fluid overload   Dyspnea    Repeat BMP in am  Shortness of breath: fluid overload vs infection vs angina Echo tomorrow morning Lasix 40 mg IV BID Imdur 120 BID Ranexa 571m BID  Diabetes: per internal medicine  Hypertension: continue home medications  Multivessel CAD severe diffuse CAD by cath in 03/2015: Declined CABG in the past, will consider repeat Cath if patient worsening over the next 48 hours.   Signed:Linus Mako  PA-C 04/29/2016 4:32 PM   Co-Sign MD  See my separate full progress note. Reviewed case, interviewed patient And viewed angiogram 03/30/15 with PA as well as examining patient  PJenkins Rouge

## 2016-04-29 NOTE — H&P (Signed)
History and Physical    Timothy Alvarez DPO:[id number] DOB: 10-29-1955 DOA: 04/29/2016  Referring MD/NP/PA: from [facility] center  PCP: Timothy Pai, PA-C   Patient coming from: home   Chief Complaint: dyspnea   HPI: Timothy Alvarez is a 61 y.o. male with medical history significant of multivessel CAD (severe diffuse CAD by cath in 03/2015, declined CABG in the past), IDDM, chronic diastolic CHF (EF 43-15% by echo in 03/2015), HTN, and HLD presented to [place] with main concern of several days duration of progressively worsening dyspnea that initially started with exertion and has progressed to dyspnea at rest in the past 24 hours. This as associated with 14 lbs weight gain, difficulty with ambulation and occasional but not consistent chest pressure. Pt denies fevers, chills, no specific abd or urinary concerns, no sick contacts or exposures. He was given 40 mg IV Lasix in ED reports feeling better at this time, has required foley cath placement as well due to urinary retention. GHe was transferred to [facility] for further evaluation and cardiology team was consul.ted for assistance.   Review of Systems:  Constitutional: Negative for fever, chills, appetite change and fatigue.  HENT: Negative for ear pain, nosebleeds, congestion, facial swelling, rhinorrhea, neck pain, neck stiffness and ear discharge.   Eyes: Negative for pain, discharge, redness, itching and visual disturbance.  Respiratory: Negative for cough, wheezing and stridor.   Cardiovascular: Negative for chest pain, palpitations Gastrointestinal: Negative for abdominal distention.  Genitourinary: Negative for dysuria, urgency, frequency, hematuria, flank pain Musculoskeletal: Negative for back pain, joint swelling, arthralgias and gait problem.  Neurological: Negative for dizziness, tremors, seizures, syncope, facial asymmetry, speech difficulty, weakness, light-headedness Hematological: Negative for adenopathy. Does not  bruise/bleed easily.  Psychiatric/Behavioral: Negative for hallucinations, behavioral problems, confusion, dysphoric mood, decreased concentration and agitation.   Past Medical History:  Diagnosis Date  . CAD (coronary artery disease)    a. 03/2015: cath showing severe diffuse CAD along LAD, LCx, and distal RCA. Patient declined CABG with medical management recommended.  . Cataracts, bilateral   . CHF (congestive heart failure) ([place])   . Chicken pox   . Contrast dye induced nephropathy   . Diabetes type 1, uncontrolled ([place])   . Diabetic retinopathy ([place])   . Frequent headaches   . Heart attack 09.13.2014   x5  . Hyperlipidemia   . Measles, [place] (rubella)   . Mumps   . Orthostatic hypotension   . OSA (obstructive sleep apnea) 07/17/2013  . Scarlet fever     Past Surgical History:  Procedure Laterality Date  . APPENDECTOMY  1966  . CARDIAC CATHETERIZATION N/A 03/18/2015   Procedure: Left Heart Cath and Coronary Angiography;  Surgeon: Belva Crome, MD;  Location: [place] [facility];  Service: Cardiovascular;  Laterality: N/A;  . CORONARY ANGIOPLASTY WITH STENT PLACEMENT    . REFRACTIVE SURGERY     Retinopathy  . WISDOM TOOTH EXTRACTION     Social Hx:  reports that he has never smoked. He has never used smokeless tobacco. He reports that he drinks alcohol. He reports that he does not use drugs.  Allergies  Allergen Reactions  . Contrast Media [Iodinated Diagnostic Agents] Other (See Comments)    Renal Failure  . Metrizamide Other (See Comments)    Renal Failure  . Lisinopril Cough    Family History  Problem Relation Age of Onset  . Hypertension Mother 55    Deceased  . Other Mother     s/p PPM  .  Vascular Disease Father 15    Deceased  . Transient ischemic attack Father   . Liver cancer Paternal Grandfather   . Breast cancer Sister   . Heart disease Sister     Ablation for rhythm disturbance  . Healthy Brother     x1    Prior to Admission medications     Medication Sig Start Date End Date Taking? Authorizing Provider  aspirin 81 MG tablet Take 81 mg by mouth daily.   Yes Historical Provider, MD  atropine 1 % ophthalmic solution 3 (three) times daily.   Yes Historical Provider, MD  brimonidine-timolol (COMBIGAN) 0.2-0.5 % ophthalmic solution Place 1 drop into the right eye every 12 (twelve) hours.   Yes Historical Provider, MD  clopidogrel (PLAVIX) 75 MG tablet Take 1 tablet (75 mg total) by mouth daily. 03/30/16  Yes Erma Heritage, PA  co-enzyme Q-10 30 MG capsule Take 30 mg by mouth daily.    Yes Historical Provider, MD  fludrocortisone (FLORINEF) 0.1 MG tablet TAKE 1 TABLET BY MOUTH TWICE DAILY Patient taking differently: TAKE 0.1 mg BY MOUTH TWICE DAILY 01/28/16  Yes Brunetta Jeans, PA-C  furosemide (LASIX) 20 MG tablet 1 tablet by mouth twice a day 04/15/16  Yes Erma Heritage, PA  gabapentin (NEURONTIN) 600 MG tablet TAKE 1 TABLET BY MOUTH 3 TIMES DAILY. 04/18/16  Yes Edward Saguier, PA-C  glucagon 1 MG injection Inject 1 mg into the vein once as needed (Emergency Test Kit). Reported on 04/07/2015   Yes Historical Provider, MD  HYDROcodone-acetaminophen (NORCO/VICODIN) 5-325 MG tablet Take 1 tablet by mouth every 6 (six) hours as needed for severe pain. 04/22/15  Yes Brunetta Jeans, PA-C  insulin lispro (HUMALOG) 100 UNIT/ML injection as directed. Use in Insulin Pump, Max daily dose: 100 units/day   Yes Historical Provider, MD  isosorbide mononitrate (IMDUR) 120 MG 24 hr tablet Take 1 tablet (120 mg total) by mouth 2 (two) times daily. 04/14/16  Yes Erma Heritage, PA  latanoprost (XALATAN) 0.005 % ophthalmic solution Place 1 drop into both eyes at bedtime.   Yes Historical Provider, MD  losartan (COZAAR) 25 MG tablet Take 0.5 tablets (12.5 mg total) by mouth daily. 04/14/16  Yes Erma Heritage, PA  meclizine (ANTIVERT) 12.5 MG tablet Take 1 tablet (12.5 mg total) by mouth 3 (three) times daily as needed for dizziness. 02/27/14  Yes  Edward Saguier, PA-C  midodrine (PROAMATINE) 10 MG tablet TAKE 1 TABLET BY MOUTH TWICE DAILY Patient taking differently: TAKE 10 mg BY MOUTH TWICE DAILY 01/28/16  Yes Brunetta Jeans, PA-C  nitroGLYCERIN (NITROSTAT) 0.4 MG SL tablet Place 1 tablet (0.4 mg total) under the tongue every 5 (five) minutes x 3 doses as needed for chest pain. 03/20/15  Yes Bhavinkumar Bhagat, PA  potassium chloride SA (K-DUR,KLOR-CON) 20 MEQ tablet Take 1 tablet (20 mEq total) by mouth daily. 03/31/16  Yes Erma Heritage, PA  Probiotic CAPS Take 1 capsule by mouth every morning.   Yes Historical Provider, MD  ranitidine (ZANTAC) 150 MG tablet TAKE 1 TABLET BY MOUTH TWICE DAILY Patient taking differently: TAKE 150 mg BY MOUTH TWICE DAILY 12/09/15  Yes Brunetta Jeans, PA-C  rosuvastatin (CRESTOR) 40 MG tablet Take 40 mg by mouth daily. 01/28/16  Yes Historical Provider, MD  SENEXON-S 8.6-50 MG tablet TAKE 1 TABLET BY MOUTH DAILY. 02/13/15  Yes Brunetta Jeans, PA-C  zolpidem (AMBIEN CR) 12.5 MG CR tablet Take 1 tablet (12.5 mg total) by  mouth at bedtime. 03/03/16  Yes Timothy Pai, PA-C    Physical Exam: Vitals:   04/29/16 1330 04/29/16 1400 04/29/16 1430 04/29/16 1500  BP: 139/83 112/98 118/68 130/60  Pulse: 70 76 72 73  Resp: _0 Temp:      TempSrc:      SpO2: 97% 96% 97% 96%  Weight:      Height:        Constitutional: NAD, calm, comfortable Vitals:   04/29/16 1330 04/29/16 1400 04/29/16 1430 04/29/16 1500  BP: 139/83 112/98 118/68 130/60  Pulse: 70 76 72 73  Resp: _1 Temp:      TempSrc:      SpO2: 97% 96% 97% 96%  Weight:      Height:       Eyes: PERRL, lids and conjunctivae normal ENMT: Mucous membranes are moist. Posterior pharynx clear of any exudate or lesions.Normal dentition.  Neck: normal, supple, no masses, no thyromegaly Respiratory: clear to auscultation bilaterally, no wheezing, no crackles. Normal respiratory effort. No accessory muscle use.  Cardiovascular:  Irregular rate and rhythm, no rubs / gallops. No extremity edema. 2+ pedal pulses. No carotid bruits.  Abdomen: no tenderness, no masses palpated. No hepatosplenomegaly. Bowel sounds positive.  Musculoskeletal: no clubbing / cyanosis. No joint deformity upper and lower extremities. Good ROM, no contractures. Normal muscle tone.  Skin: no rashes, lesions, ulcers. No induration Neurologic: CN 2-12 grossly intact. Sensation intact, DTR normal. Strength 5/5 in all 4.  Psychiatric: Normal judgment and insight. Alert and oriented x 3. Normal mood.   Indwelling cath placed here on admission   Labs on Admission: I have personally reviewed following labs and imaging studies  CBC:  Recent Labs Lab 04/29/16 1025  WBC 5.3  NEUTROABS 3.2  HGB 11.7*  HCT 35.9*  MCV 87.3  PLT 138   Basic Metabolic Panel:  Recent Labs Lab 04/29/16 1025  NA 141  K 3.6  CL 104  CO2 29  GLUCOSE 96  BUN 19  CREATININE 1.15  CALCIUM 8.4*   BNP (last 3 results)  Recent Labs  02/10/16 1030 02/17/16 1245  PROBNP 477.0* 695.0*   Urine analysis:    Component Value Date/Time   COLORURINE YELLOW 04/29/2016 1450   APPEARANCEUR CLEAR 04/29/2016 1450   LABSPEC 1.009 04/29/2016 1450   PHURINE 7.5 04/29/2016 1450   GLUCOSEU NEGATIVE 04/29/2016 1450   GLUCOSEU 500 (A) 04/07/2015 1418   HGBUR NEGATIVE 04/29/2016 1450   BILIRUBINUR NEGATIVE 04/29/2016 1450   BILIRUBINUR neg 04/22/2015 1130   KETONESUR NEGATIVE 04/29/2016 1450   PROTEINUR NEGATIVE 04/29/2016 1450   UROBILINOGEN 0.2 04/22/2015 1130   UROBILINOGEN 0.2 04/07/2015 1418   NITRITE NEGATIVE 04/29/2016 1450   LEUKOCYTESUR NEGATIVE 04/29/2016 1450   Radiological Exams on Admission: Dg Chest 2 View  Result Date: 04/29/2016 CLINICAL DATA:  Shortness of breath. EXAM: CHEST  2 VIEW COMPARISON:  Radiographs of March 26, 2016. FINDINGS: The heart size and mediastinal contours are within normal limits. No pneumothorax is noted. Mild central pulmonary  vascular congestion is noted. Possible minimal bibasilar pulmonary edema may be present. Minimal bilateral pleural effusions are noted. The visualized skeletal structures are unremarkable. IMPRESSION: Mild central pulmonary vascular congestion. Minimal bilateral pleural effusions are noted. Possible minimal bibasilar pulmonary edema. Electronically Signed   By: Marijo Conception, M.D.   On: 04/29/2016 10:59    EKG: pending   Assessment/Plan Active Problems:   Dyspnea  - in the setting of  fluid overload - started on Lasix 40 mg IV BID - monitor daily weights, strict I/O - plan for ECHO in AM - appreciate cardiology team following     DM type II - on insulin pump which was ordered     HTN - resume home medical regimen     Obesity  - Body mass index is 31.34 kg/m.   DVT prophylaxis: Lovenox SQ Code Status: Full  Family Communication: Pt updated at bedside Disposition Plan: Home once cleared by cardiology  Consults called: Cardiology  Admission status: inpatient   Faye Ramsay MD [facility] Pager [phone]  If 7PM-7AM, please contact night-coverage [email or web address] Password [email or web address]  04/29/2016, 4:21 PM

## 2016-04-29 NOTE — ED Provider Notes (Addendum)
Timothy Alvarez   CSN: [id number] Arrival date & time: 04/29/16  5361     History   Chief Complaint Chief Complaint  Patient presents with  . Shortness of Breath    HPI Timothy Alvarez is a 61 y.o. male.Point of shortness of breath progressively worsening for the past 5 days. He has doubled his Lasix dose from 20 mg twice a day to 40 mg twice a day over the past 3 days. He continues to have weight gain of 13-14 pounds over the past 5 days. Shortness of breath is worse with minimal exertion and improved with remaining still. No cough no fever. No other associated symptoms. Feels like congestive heart failure he's had in the past. Patient also reports she's fallen 3 times in the past week due to poor balance. He complains of pain at left lower back. , Nonradiating. Patient treated himself with Lasix 40 mg this morning also treated himself with his routine aspirin and Plavix HPI  Past Medical History:  Diagnosis Date  . CAD (coronary artery disease)    a. 03/2015: cath showing severe diffuse CAD along LAD, LCx, and distal RCA. Patient declined CABG with medical management recommended.  . Cataracts, bilateral   . CHF (congestive heart failure) ([place])   . Chicken pox   . Contrast dye induced nephropathy   . Diabetes type 1, uncontrolled ([place])   . Diabetic retinopathy ([place])   . Frequent headaches   . Heart attack 09.13.2014   x5  . Hyperlipidemia   . Measles, [place] (rubella)   . Mumps   . Orthostatic hypotension   . OSA (obstructive sleep apnea) 07/17/2013  . Scarlet fever     Patient Active Problem List   Diagnosis Date Noted  . Influenza A 03/30/2016  . Essential hypertension 02/13/2016  . Foot ulcer, left ([place]) 11/06/2015  . Disability examination 07/26/2015  . Edema 07/01/2015  . Hematuria 04/30/2015  . Gastroesophageal reflux disease without esophagitis 04/22/2015  . NSTEMI (non-ST elevated myocardial infarction) ([place]) 03/18/2015  . Severe comorbid  illness   . SOB (shortness of breath) 08/12/2014  . Fatigue 02/03/2014  . OSA (obstructive sleep apnea) 07/17/2013  . Bruit 05/08/2013  . 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 Chronic pain syndrome 04/07/2013  . CAD S/P percutaneous coronary angioplasty 04/07/2013  . Neuropathy ([place]) 04/07/2013  . Orthostatic hypotension 04/07/2013  . Type 2 diabetes mellitus with renal manifestations ([place]) 04/07/2013  . Depression 04/07/2013  . Insomnia 04/07/2013  . Hyperlipidemia LDL goal <70 04/07/2013  . Encounter to establish care 04/07/2013    Past Surgical History:  Procedure Laterality Date  . APPENDECTOMY  1966  . CARDIAC CATHETERIZATION N/A 03/18/2015   Procedure: Left Heart Cath and Coronary Angiography;  Surgeon: Belva Crome, MD;  Location: [facility];  Service: Cardiovascular;  Laterality: N/A;  . CORONARY ANGIOPLASTY WITH STENT PLACEMENT    . REFRACTIVE SURGERY     Retinopathy  . WISDOM TOOTH EXTRACTION         Home Medications    Prior to Admission medications   Medication Sig Start Date End Date Taking? Authorizing Provider  aspirin 81 MG tablet Take 81 mg by mouth daily.   Yes Historical Provider, MD  atropine 1 % ophthalmic solution 3 (three) times daily.   Yes Historical Provider, MD  brimonidine-timolol (COMBIGAN) 0.2-0.5 % ophthalmic solution Place 1 drop into the right eye every 12 (twelve) hours.   Yes Historical Provider, MD  clopidogrel (  PLAVIX) 75 MG tablet Take 1 tablet (75 mg total) by mouth daily. 03/30/16  Yes Erma Heritage, PA  co-enzyme Q-10 30 MG capsule Take 30 mg by mouth daily.    Yes Historical Provider, MD  fludrocortisone (FLORINEF) 0.1 MG tablet TAKE 1 TABLET BY MOUTH TWICE DAILY Patient taking differently: TAKE 0.1 mg BY MOUTH TWICE DAILY 01/28/16  Yes Brunetta Jeans, PA-C  furosemide (LASIX) 20 MG tablet 1 tablet by mouth twice a day 04/15/16  Yes Erma Heritage, PA  gabapentin (NEURONTIN) 600 MG tablet TAKE 1 TABLET BY  MOUTH 3 TIMES DAILY. 04/18/16  Yes Edward Saguier, PA-C  glucagon 1 MG injection Inject 1 mg into the vein once as needed (Emergency Test Kit). Reported on 04/07/2015   Yes Historical Provider, MD  HYDROcodone-acetaminophen (NORCO/VICODIN) 5-325 MG tablet Take 1 tablet by mouth every 6 (six) hours as needed for severe pain. 04/22/15  Yes Brunetta Jeans, PA-C  insulin lispro (HUMALOG) 100 UNIT/ML injection as directed. Use in Insulin Pump, Max daily dose: 100 units/day   Yes Historical Provider, MD  isosorbide mononitrate (IMDUR) 120 MG 24 hr tablet Take 1 tablet (120 mg total) by mouth 2 (two) times daily. 04/14/16  Yes Erma Heritage, PA  latanoprost (XALATAN) 0.005 % ophthalmic solution Place 1 drop into both eyes at bedtime.   Yes Historical Provider, MD  losartan (COZAAR) 25 MG tablet Take 0.5 tablets (12.5 mg total) by mouth daily. 04/14/16  Yes Erma Heritage, PA  meclizine (ANTIVERT) 12.5 MG tablet Take 1 tablet (12.5 mg total) by mouth 3 (three) times daily as needed for dizziness. 02/27/14  Yes Edward Saguier, PA-C  midodrine (PROAMATINE) 10 MG tablet TAKE 1 TABLET BY MOUTH TWICE DAILY Patient taking differently: TAKE 10 mg BY MOUTH TWICE DAILY 01/28/16  Yes Brunetta Jeans, PA-C  nitroGLYCERIN (NITROSTAT) 0.4 MG SL tablet Place 1 tablet (0.4 mg total) under the tongue every 5 (five) minutes x 3 doses as needed for chest pain. 03/20/15  Yes Bhavinkumar Bhagat, PA  potassium chloride SA (K-DUR,KLOR-CON) 20 MEQ tablet Take 1 tablet (20 mEq total) by mouth daily. 03/31/16  Yes Erma Heritage, PA  Probiotic CAPS Take 1 capsule by mouth every morning.   Yes Historical Provider, MD  ranitidine (ZANTAC) 150 MG tablet TAKE 1 TABLET BY MOUTH TWICE DAILY Patient taking differently: TAKE 150 mg BY MOUTH TWICE DAILY 12/09/15  Yes Brunetta Jeans, PA-C  rosuvastatin (CRESTOR) 40 MG tablet Take 40 mg by mouth daily. 01/28/16  Yes Historical Provider, MD  SENEXON-S 8.6-50 MG tablet TAKE 1 TABLET BY  MOUTH DAILY. 02/13/15  Yes Brunetta Jeans, PA-C  zolpidem (AMBIEN CR) 12.5 MG CR tablet Take 1 tablet (12.5 mg total) by mouth at bedtime. 03/03/16  Yes Mackie Pai, PA-C    Family History Family History  Problem Relation Age of Onset  . Hypertension Mother 4    Deceased  . Other Mother     s/p PPM  . Vascular Disease Father 41    Deceased  . Transient ischemic attack Father   . Liver cancer Paternal Grandfather   . Breast cancer Sister   . Heart disease Sister     Ablation for rhythm disturbance  . Healthy Brother     x1    Social History Social History  Substance Use Topics  . Smoking status: Never Smoker  . Smokeless tobacco: Never Used  . Alcohol use Yes     Comment: Rare  Allergies   Contrast media [iodinated diagnostic agents]; Metrizamide; and Lisinopril   Review of Systems Review of Systems  Constitutional: Positive for unexpected weight change.  Respiratory: Positive for shortness of breath.   Cardiovascular: Positive for leg swelling.       Worsening leg swelling over the past 5 days  Musculoskeletal: Positive for back pain and gait problem.       Walks with cane or walker  All other systems reviewed and are negative.    Physical Exam Updated Vital Signs BP 121/78   Pulse 64   Temp 98.3 F (36.8 C) (Oral)   Resp 17   Ht _0  (1.676 m)   Wt 198 lb (89.8 kg)   SpO2 100%   BMI 31.96 kg/m   Physical Exam  Constitutional:  Chronically ill-appearing  HENT:  Head: Normocephalic and atraumatic.  Eyes: Conjunctivae are normal. Pupils are equal, round, and reactive to light.  Neck: Neck supple. JVD present. No tracheal deviation present. No thyromegaly present.  Cardiovascular: Normal rate and regular rhythm.   No murmur heard. Pulmonary/Chest: Effort normal and breath sounds normal. No respiratory distress.  Rales at bases  Abdominal: Soft. Bowel sounds are normal. He exhibits no distension. There is no tenderness.  Musculoskeletal:  Normal range of motion. He exhibits tenderness. He exhibits no edema.  1+ pretibial pitting edema bilaterally. Entire spine is nontender. Pelvis stable nontender. He has mild tenderness at paralumbar area in mid scapular line. Without swelling  Neurological: He is alert. Coordination normal.  Skin: Skin is warm and dry. No rash noted.  Psychiatric: He has a normal mood and affect.  Nursing Alvarez and vitals reviewed.    ED Treatments / Results  Labs (all labs ordered are listed, but only abnormal results are displayed) Labs Reviewed  BASIC METABOLIC PANEL - Abnormal; Notable for the following:       Result Value   Calcium 8.4 (*)    All other components within normal limits  CBC WITH DIFFERENTIAL/PLATELET - Abnormal; Notable for the following:    RBC 4.11 (*)    Hemoglobin 11.7 (*)    HCT 35.9 (*)    All other components within normal limits  I-STAT TROPOININ, ED - Abnormal; Notable for the following:    Troponin i, poc 0.69 (*)    All other components within normal limits  BRAIN NATRIURETIC PEPTIDE    EKG  EKG Interpretation  Date/Time:  Friday April 29 2016 10:09:55 EST Ventricular Rate:  68 PR Interval:    QRS Duration: 103 QT Interval:  443 QTC Calculation: 472 R Axis:   -43 Text Interpretation:  Sinus rhythm Probable left atrial enlargement Left anterior fascicular block Consider anterior infarct Nonspecific T abnormalities, lateral leads No significant change since last tracing Confirmed by Winfred Leeds  MD, Jaquise Faux [phone]) on 04/29/2016 10:16:03 AM      Chest x-ray viewed by me Results for orders placed or performed during the hospital encounter of 09/11/14  Basic metabolic panel  Result Value Ref Range   Sodium 141 135 - 145 mmol/L   Potassium 3.6 3.5 - 5.1 mmol/L   Chloride 104 101 - 111 mmol/L   CO2 29 22 - 32 mmol/L   Glucose, Bld 96 65 - 99 mg/dL   BUN 19 6 - 20 mg/dL   Creatinine, Ser 1.15 0.61 - 1.24 mg/dL   Calcium 8.4 (L) 8.9 - 10.3 mg/dL   GFR calc non  Af Amer >60 >60 mL/min   GFR calc Af  Amer >60 >60 mL/min   Anion gap 8 5 - 15  CBC with Differential  Result Value Ref Range   WBC 5.3 4.0 - 10.5 K/uL   RBC 4.11 (L) 4.22 - 5.81 MIL/uL   Hemoglobin 11.7 (L) 13.0 - 17.0 g/dL   HCT 35.9 (L) 39.0 - 52.0 %   MCV 87.3 78.0 - 100.0 fL   MCH 28.5 26.0 - 34.0 pg   MCHC 32.6 30.0 - 36.0 g/dL   RDW 14.5 11.5 - 15.5 %   Platelets 193 150 - 400 K/uL   Neutrophils Relative % 59 %   Neutro Abs 3.2 1.7 - 7.7 K/uL   Lymphocytes Relative 23 %   Lymphs Abs 1.2 0.7 - 4.0 K/uL   Monocytes Relative 10 %   Monocytes Absolute 0.5 0.1 - 1.0 K/uL   Eosinophils Relative 7 %   Eosinophils Absolute 0.4 0.0 - 0.7 K/uL   Basophils Relative 1 %   Basophils Absolute 0.0 0.0 - 0.1 K/uL  I-Stat Troponin, ED (not at [facility])  Result Value Ref Range   Troponin i, poc 0.69 (HH) 0.00 - 0.08 ng/mL   Comment NOTIFIED PHYSICIAN    Comment 3           Dg Chest 2 View  Result Date: 04/29/2016 CLINICAL DATA:  Shortness of breath. EXAM: CHEST  2 VIEW COMPARISON:  Radiographs of March 26, 2016. FINDINGS: The heart size and mediastinal contours are within normal limits. No pneumothorax is noted. Mild central pulmonary vascular congestion is noted. Possible minimal bibasilar pulmonary edema may be present. Minimal bilateral pleural effusions are noted. The visualized skeletal structures are unremarkable. IMPRESSION: Mild central pulmonary vascular congestion. Minimal bilateral pleural effusions are noted. Possible minimal bibasilar pulmonary edema. Electronically Signed   By: Marijo Conception, M.D.   On: 04/29/2016 10:59   Dg Foot Complete Right  Result Date: 04/05/2016 CLINICAL DATA:  Right foot pain. History of Charcot arthropathy. No known injury. EXAM: RIGHT FOOT COMPLETE - 3+ VIEW COMPARISON:  Plain films the right foot 02/23/2016 and 12/01/2015. MRI right foot 03/12/2016. FINDINGS: Extensive destructive change and disorganized appearance of the tarsometatarsal joints is  again seen and not notably changed since the most recent plain films. There is medial subluxation of the first metatarsal on the medial cuneiform and lateral subluxation of the second metatarsal on the middle cuneiform, unchanged. Remote fracture of the base of the fifth metatarsal is identified. Osteoarthritis is seen at the IP joint of the great toe. Soft tissue swelling is present about the foot. Atherosclerosis noted. IMPRESSION: No acute abnormality. Charcot change of the midfoot. Remote fracture base of the right fifth metatarsal. Osteoarthritis IP joint right great toe. Soft tissue swelling. Electronically Signed   By: Inge Rise M.D.   On: 04/05/2016 13:52   Radiology Dg Chest 2 View  Result Date: 04/29/2016 CLINICAL DATA:  Shortness of breath. EXAM: CHEST  2 VIEW COMPARISON:  Radiographs of March 26, 2016. FINDINGS: The heart size and mediastinal contours are within normal limits. No pneumothorax is noted. Mild central pulmonary vascular congestion is noted. Possible minimal bibasilar pulmonary edema may be present. Minimal bilateral pleural effusions are noted. The visualized skeletal structures are unremarkable. IMPRESSION: Mild central pulmonary vascular congestion. Minimal bilateral pleural effusions are noted. Possible minimal bibasilar pulmonary edema. Electronically Signed   By: Marijo Conception, M.D.   On: 04/29/2016 10:59    Procedures Procedures (including critical care time)  Medications Ordered in ED Medications  furosemide (  LASIX) injection 40 mg (not administered)     Initial Impression / Assessment and Plan / ED Course  I have reviewed the triage vital signs and the nursing notes.  Pertinent labs & imaging results that were available during my care of the patient were reviewed by me and considered in my medical decision making (see chart for details).     I spoke with Dr Johnsie Cancel via telephone who requested the patient have IV diuresis and to consult hospitalist  service for overnight stay. Hospitalist service can consult cardiologist if needed. I consulted Dr.Hobbs telephone who will arrange [facility] transfer [facility]. He requests nitroglycerin ointment be placed on patient, which I have ordered   patient has chronically elevated troponin. Anemia is chronic. She will be transferred to [facility]  Final Clinical Impressions(s) / ED Diagnoses  Diagnosis #1 congestive heart failure , Acute on chronic #2 anemia  #3 contusion to back Final diagnoses:  None    New Prescriptions New Prescriptions   No medications on file     Orlie Dakin, MD 04/29/16 1211    Orlie Dakin, MD 04/29/16 1211    Orlie Dakin, MD 04/29/16 1212 Addendum patient found to be in urinary retention after Lasix administered, with greater than 1000 mL of urine in bladder on bladder scan. Foley catheter ordered   Orlie Dakin, MD 04/29/16 1441

## 2016-04-29 NOTE — ED Notes (Signed)
Bladder scan shows greater thatn 999 ml of urine in bladder. Pt [facility] Pt tolerated well.

## 2016-04-29 NOTE — ED Notes (Signed)
Pt called out and is unable to urinate. Has approx 50cc in urinal since lasix.  Will notify MD.

## 2016-04-29 NOTE — Progress Notes (Signed)
Patient examined chart reviewed See full note from PA  His subjective symptoms are disproportionate to findings Suggests that some of his exertional dyspnea is an anginal equivalent I reviewed his cath from 03/2015 and he has horrible diffuse mid / distal dx This includes PLA, , OM, entire LAD which is subtotally occluded and D1 These vessels are not graftable and would not take a 2.0 mm stent In absence of chest pain with lower troponin than 2 months ago no acute ECG changes There is no indication for cath unless his symptoms do not improve with Rx CHF.  Lasix 40 iv bid Start Ranexa 500 bid Continue high dose imdur 120 bid Not on beta blocker consider coreg in future HR tends to be in 60's  Surprising with extent of CAD that EF 03/30/15 is low normal Will repeat echo to see if marked decrease   He has had good diuresis with lasix  Has some urinary retention and needed foley  He wants to use his insulin pump in hospital  If he improves with diuresis can likely go home Sunday If not he is open to possible right and left cath with minimal dye To assess pressures and make sure large mid RCA stent is patent  Exam with basilar crackles no murmur trace LE edema Insulin pump left shoulder  Timothy HawsPeter Kaislee Alvarez

## 2016-04-29 NOTE — Telephone Encounter (Signed)
Called to follow up with patient.  Left a message for call back.   

## 2016-04-29 NOTE — ED Notes (Signed)
14 fr f/c inserted without difficulty with clear yellow urine return. Tolerated well.

## 2016-04-29 NOTE — ED Notes (Signed)
Report given to Toshia RN on 3E [facility].

## 2016-04-30 ENCOUNTER — Other Ambulatory Visit (HOSPITAL_COMMUNITY): Payer: Medicare HMO

## 2016-04-30 DIAGNOSIS — R0602 Shortness of breath: Secondary | ICD-10-CM | POA: Diagnosis not present

## 2016-04-30 DIAGNOSIS — I5023 Acute on chronic systolic (congestive) heart failure: Secondary | ICD-10-CM | POA: Diagnosis not present

## 2016-04-30 DIAGNOSIS — I5042 Chronic combined systolic (congestive) and diastolic (congestive) heart failure: Secondary | ICD-10-CM | POA: Insufficient documentation

## 2016-04-30 DIAGNOSIS — E8770 Fluid overload, unspecified: Secondary | ICD-10-CM | POA: Diagnosis not present

## 2016-04-30 LAB — CBC
HCT: 36 % — ABNORMAL LOW (ref 39.0–52.0)
Hemoglobin: 11.5 g/dL — ABNORMAL LOW (ref 13.0–17.0)
MCH: 27.6 pg (ref 26.0–34.0)
MCHC: 31.9 g/dL (ref 30.0–36.0)
MCV: 86.3 fL (ref 78.0–100.0)
PLATELETS: 197 10*3/uL (ref 150–400)
RBC: 4.17 MIL/uL — ABNORMAL LOW (ref 4.22–5.81)
RDW: 14.7 % (ref 11.5–15.5)
WBC: 7.4 10*3/uL (ref 4.0–10.5)

## 2016-04-30 LAB — BASIC METABOLIC PANEL
Anion gap: 10 (ref 5–15)
BUN: 17 mg/dL (ref 6–20)
CALCIUM: 8.4 mg/dL — AB (ref 8.9–10.3)
CO2: 30 mmol/L (ref 22–32)
CREATININE: 1.28 mg/dL — AB (ref 0.61–1.24)
Chloride: 101 mmol/L (ref 101–111)
GFR calc non Af Amer: 58 mL/min — ABNORMAL LOW (ref >60)
GLUCOSE: 199 mg/dL — AB (ref 65–99)
Potassium: 3.6 mmol/L (ref 3.5–5.1)
Sodium: 141 mmol/L (ref 135–145)

## 2016-04-30 LAB — GLUCOSE, CAPILLARY
GLUCOSE-CAPILLARY: 217 mg/dL — AB (ref 65–99)
GLUCOSE-CAPILLARY: 218 mg/dL — AB (ref 65–99)
GLUCOSE-CAPILLARY: 107 mg/dL — AB (ref 65–99)
GLUCOSE-CAPILLARY: 152 mg/dL — AB (ref 65–99)

## 2016-04-30 LAB — HIV ANTIBODY (ROUTINE TESTING W REFLEX): HIV Screen 4th Generation wRfx: NONREACTIVE

## 2016-04-30 LAB — TROPONIN I: TROPONIN I: 0.49 ng/mL — AB (ref <0.03)

## 2016-04-30 MED ORDER — BENZONATATE 100 MG PO CAPS
200.0000 mg | ORAL_CAPSULE | Freq: Three times a day (TID) | ORAL | Status: DC | PRN
  Administered 2016-04-30 – 2016-05-03 (×4): 200 mg via ORAL
  Filled 2016-04-30 (×4): qty 2

## 2016-04-30 MED ORDER — INSULIN PUMP
Freq: Three times a day (TID) | SUBCUTANEOUS | Status: DC
  Administered 2016-04-30: 6.5 via SUBCUTANEOUS
  Administered 2016-04-30: 6.85 via SUBCUTANEOUS
  Administered 2016-04-30 – 2016-05-01 (×3): via SUBCUTANEOUS
  Administered 2016-05-01: 4.5 via SUBCUTANEOUS
  Administered 2016-05-01: 08:00:00 via SUBCUTANEOUS
  Filled 2016-04-30: qty 1

## 2016-04-30 MED ORDER — FUROSEMIDE 10 MG/ML IJ SOLN
40.0000 mg | Freq: Every day | INTRAMUSCULAR | Status: DC
  Filled 2016-04-30: qty 4

## 2016-04-30 MED ORDER — POTASSIUM CHLORIDE CRYS ER 20 MEQ PO TBCR
40.0000 meq | EXTENDED_RELEASE_TABLET | Freq: Once | ORAL | Status: AC
  Administered 2016-04-30: 40 meq via ORAL
  Filled 2016-04-30: qty 2

## 2016-04-30 NOTE — Progress Notes (Signed)
Patient ID: Timothy Alvarez, male   DOB: 1955-07-22, 61 y.o.   MRN: [id number]    PROGRESS NOTE    Timothy Alvarez  WUJ:[id number] DOB: October 12, 1955 DOA: 04/29/2016  PCP: Marisue [place]   Brief Narrative:  61 y.o. male with medical history significant of multivessel CAD (severe diffuse CAD by cath in 03/2015, declined CABG in the past), IDDM, chronic diastolic CHF (EF 95-62% by echo in 03/2015), HTN, and HLD presented to [facility] with main concern of several days duration of progressively worsening dyspnea that initially started with exertion and has progressed to dyspnea at rest in the past 24 hours. This as associated with 14 lbs weight gain, difficulty with ambulation and occasional but not consistent chest pressure. Pt denies fevers, chills, no specific abd or urinary concerns, no sick contacts or exposures. He was given 40 mg IV Lasix in ED reports feeling better at this time, has required foley cath placement as well due to urinary retention. GHe was transferred to [facility] for further evaluation and cardiology team was consul.ted for assistance  Assessment & Plan:   Active Problems:   Dyspnea  - in the setting of fluid overload - started on Lasix 40 mg IV BID - weight trend since admission: Filed Weights   04/29/16 1009 04/29/16 1624 04/30/16 0555  Weight: 89.8 kg (198 lb) 88.1 kg (194 lb 3.2 oz) 87 kg (191 lb 12.8 oz)     Acute kidney injury - from lasix - BMP in AM    DM type II - on insulin pump which we are continuing here     HTN - continue home medical regimen     Obesity  - Body mass index is 31.34 kg/m.   DVT prophylaxis: Lovenox SQ Code Status: Full  Family Communication: Patient at bedside  Disposition Plan: Home when cardiology team clears   Consultants:   Cardiology   Procedures:   ECHO 2/17 -->  Antimicrobials:   None   Subjective: Still some exertional dyspnea this AM.   Objective: Vitals:   04/30/16 0555 04/30/16 0807  04/30/16 1027 04/30/16 1124  BP: 137/66 (!) 148/81 (!) 98/49 (!) 110/56  Pulse: 77 86  73  Resp: 18 18 16 18   Temp: 98.4 F (36.9 C)  99 F (37.2 C) 98.6 F (37 C)  TempSrc: Oral  Oral Oral  SpO2: 96% 100% 98% 100%  Weight: 87 kg (191 lb 12.8 oz)     Height:        Intake/Output Summary (Last 24 hours) at 04/30/16 1257 Last data filed at 04/30/16 1016  Gross per 24 hour  Intake              720 ml  Output             3850 ml  Net            -3130 ml   Filed Weights   04/29/16 1009 04/29/16 1624 04/30/16 0555  Weight: 89.8 kg (198 lb) 88.1 kg (194 lb 3.2 oz) 87 kg (191 lb 12.8 oz)   Examination:  General exam: Appears calm and comfortable  Respiratory system: Clear to auscultation. Respiratory effort normal. Cardiovascular system: S1 & S2 heard, RRR. No JVD, murmurs, rubs, gallops or clicks. +1 bilateral LE pitting edema  Gastrointestinal system: Abdomen is nondistended, soft and nontender. No organomegaly or masses felt. Normal bowel sounds heard. Central nervous system: Alert and oriented. No focal neurological deficits. Extremities: Symmetric 5 x 5  power. Skin: No rashes, lesions or ulcers Psychiatry: Judgement and insight appear normal. Mood & affect appropriate.    Data Reviewed: I have personally reviewed following labs and imaging studies  CBC:  Recent Labs Lab 04/29/16 1025 04/29/16 1807 04/30/16 0406  WBC 5.3 5.9 7.4  NEUTROABS 3.2  --   --   HGB 11.7* 12.1* 11.5*  HCT 35.9* 37.4* 36.0*  MCV 87.3 87.0 86.3  PLT 193 194 197   Basic Metabolic Panel:  Recent Labs Lab 04/29/16 1025 04/29/16 1807 04/30/16 0406  NA 141  --  141  K 3.6  --  3.6  CL 104  --  101  CO2 29  --  30  GLUCOSE 96  --  199*  BUN 19  --  17  CREATININE 1.15 1.25* 1.28*  CALCIUM 8.4*  --  8.4*   Cardiac Enzymes:  Recent Labs Lab 04/29/16 1807 04/29/16 2228 04/30/16 0406  TROPONINI 0.56* 0.51* 0.49*   BNP (last 3 results)  Recent Labs  02/10/16 1030  02/17/16 1245  PROBNP 477.0* 695.0*   CBG:  Recent Labs Lab 04/29/16 1623 04/29/16 2052 04/30/16 0727 04/30/16 1132  GLUCAP 128* 132* 217* 218*   Urine analysis:    Component Value Date/Time   COLORURINE YELLOW 04/29/2016 1450   APPEARANCEUR CLEAR 04/29/2016 1450   LABSPEC 1.009 04/29/2016 1450   PHURINE 7.5 04/29/2016 1450   GLUCOSEU NEGATIVE 04/29/2016 1450   GLUCOSEU 500 (A) 04/07/2015 1418   HGBUR NEGATIVE 04/29/2016 1450   BILIRUBINUR NEGATIVE 04/29/2016 1450   BILIRUBINUR neg 04/22/2015 1130   KETONESUR NEGATIVE 04/29/2016 1450   PROTEINUR NEGATIVE 04/29/2016 1450   UROBILINOGEN 0.2 04/22/2015 1130   UROBILINOGEN 0.2 04/07/2015 1418   NITRITE NEGATIVE 04/29/2016 1450   LEUKOCYTESUR NEGATIVE 04/29/2016 1450     Radiology Studies: Dg Chest 2 View  Result Date: 04/29/2016 CLINICAL DATA:  Shortness of breath. EXAM: CHEST  2 VIEW COMPARISON:  Radiographs of March 26, 2016. FINDINGS: The heart size and mediastinal contours are within normal limits. No pneumothorax is noted. Mild central pulmonary vascular congestion is noted. Possible minimal bibasilar pulmonary edema may be present. Minimal bilateral pleural effusions are noted. The visualized skeletal structures are unremarkable. IMPRESSION: Mild central pulmonary vascular congestion. Minimal bilateral pleural effusions are noted. Possible minimal bibasilar pulmonary edema. Electronically Signed   By: Lupita RaiderJames  Green Jr, M.D.   On: 04/29/2016 10:59    Scheduled Meds: . acidophilus  1 capsule Oral Daily  . aspirin EC  81 mg Oral Daily  . atropine  1 drop Both Eyes TID  . timolol  1 drop Right Eye BID   And  . brimonidine  1 drop Right Eye BID  . clopidogrel  75 mg Oral Daily  . enoxaparin (LOVENOX) injection  40 mg Subcutaneous Q24H  . famotidine  20 mg Oral Daily  . fludrocortisone  100 mcg Oral BID  . [START ON 05/01/2016] furosemide  40 mg Intravenous Daily  . gabapentin  600 mg Oral TID  . insulin pump    Subcutaneous TID AC, HS, 0200  . isosorbide mononitrate  120 mg Oral BID  . latanoprost  1 drop Both Eyes QHS  . losartan  12.5 mg Oral Daily  . midodrine  10 mg Oral BID  . potassium chloride SA  20 mEq Oral Daily  . ranolazine  500 mg Oral BID  . rosuvastatin  40 mg Oral q1800  . senna-docusate  1 tablet Oral Daily  . sodium chloride  flush  3 mL Intravenous Q12H   Continuous Infusions:   LOS: 1 day   Time spent: 20 minutes   Debbora Presto, MD [facility] Pager [phone]  If 7PM-7AM, please contact night-coverage [email or web address] Password [facility] 04/30/2016, 12:57 PM

## 2016-04-30 NOTE — Progress Notes (Signed)
Patient complained of being short of breath after answering admission questions, oxygen turned up tp 2.5L and patient felt fine.

## 2016-04-30 NOTE — Progress Notes (Signed)
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   Objective:  Vitals:   04/30/16 0009 04/30/16 0555 04/30/16 0807 04/30/16 1027  BP: (!) 151/74 137/66 (!) 148/81 (!) 98/49  Pulse: 78 77 86   Resp: 18 18 18 16   Temp: 98.2 F (36.8 C) 98.4 F (36.9 C)  99 F (37.2 C)  TempSrc: Oral Oral  Oral  SpO2: 98% 96% 100% 98%  Weight:  191 lb 12.8 oz (87 kg)    Height:        Intake/Output from previous day:  Intake/Output Summary (Last 24 hours) at 04/30/16 1123 Last data filed at 04/30/16 1016  Gross per 24 hour  Intake              720 ml  Output             3850 ml  Net            -3130 ml    Physical Exam: Affect appropriate Chronically ill white male  HEENT: Blind / poor vison  Neck supple with no adenopathy JVP normal no bruits no thyromegaly Lungs clear with no wheezing and good diaphragmatic motion Heart:  S1/S2 no murmur, no rub, gallop or click PMI normal Abdomen: benighn, BS positve, no tenderness, no AAA no bruit.  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 No results for input(s): LIPASE, AMYLASE in the last 72 hours. CBC:  Recent Labs  04/29/16 1025 04/29/16 1807 04/30/16 0406  WBC 5.3 5.9 7.4  NEUTROABS 3.2  --   --   HGB 11.7* 12.1* 11.5*  HCT 35.9* 37.4* 36.0*  MCV 87.3 87.0 86.3  PLT 193 194 197   Cardiac Enzymes:  Recent Labs  04/29/16 1807 04/29/16 2228 04/30/16 0406  TROPONINI 0.56* 0.51* 0.49*      Imaging: Dg Chest 2 View  Result Date: 04/29/2016 CLINICAL DATA:  Shortness of breath. EXAM: CHEST  2 VIEW COMPARISON:  Radiographs of March 26, 2016. FINDINGS: The heart size and mediastinal contours are within normal limits. No pneumothorax is noted. Mild central pulmonary vascular congestion is noted. Possible minimal bibasilar pulmonary edema may be present. Minimal bilateral pleural effusions are noted. The visualized skeletal structures are unremarkable. IMPRESSION: Mild central pulmonary vascular congestion. Minimal bilateral pleural effusions are noted. Possible minimal bibasilar pulmonary edema. Electronically Signed   By: Lupita Raider, M.D.   On: 04/29/2016 10:59    Cardiac Studies:  ECG: SR poor R wave progression    Telemetry: NSR   Echo: pending   Medications:   . acidophilus  1 capsule Oral Daily  . aspirin EC  81 mg Oral Daily  . atropine  1 drop Both Eyes TID  . timolol  1 drop Right Eye BID  And  . brimonidine  1 drop Right Eye BID  . clopidogrel  75 mg Oral Daily  . enoxaparin (LOVENOX) injection  40 mg Subcutaneous Q24H  . famotidine  20 mg Oral Daily  . fludrocortisone  100 mcg Oral BID  . furosemide  40 mg Intravenous BID  . gabapentin  600 mg Oral TID  . insulin pump   Subcutaneous TID AC, HS, 0200  . isosorbide mononitrate  120 mg Oral BID  . latanoprost  1 drop Both Eyes QHS  . losartan  12.5 mg Oral Daily  . midodrine  10 mg Oral BID  . potassium chloride SA  20 mEq Oral Daily  . ranolazine  500 mg Oral BID  . rosuvastatin  40 mg Oral q1800  . senna-docusate  1 tablet Oral Daily  . 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 Mild CHF by CXR, BNP and exam Good diuresis change Lasix to iv once daily. Have added Ranexa not clear if dyspnea is anginal equivalent. Poor functional status continue PT/OT  Using insulin pump in hospital. Repeat echo pending EF  50-55% by echo 03/20/15 Chronically elevated troponin No chest pain currenlty  Charlton HawsPeter Nishan 04/30/2016, 11:23 AM

## 2016-04-30 NOTE — Progress Notes (Signed)
Pt received 100 CHO dinner tray late at 1900. Pt's CBG per his own meter = 162. Insulin pump administered 13.9 units at this time.   Pt's CBG at bedtime = 162. Insulin pump administered 1.15 units. This is documented in the comment section of pt's MAR.

## 2016-05-01 ENCOUNTER — Inpatient Hospital Stay (HOSPITAL_COMMUNITY): Payer: Medicare HMO

## 2016-05-01 DIAGNOSIS — E8770 Fluid overload, unspecified: Secondary | ICD-10-CM | POA: Diagnosis not present

## 2016-05-01 DIAGNOSIS — R0600 Dyspnea, unspecified: Secondary | ICD-10-CM

## 2016-05-01 DIAGNOSIS — I5023 Acute on chronic systolic (congestive) heart failure: Secondary | ICD-10-CM | POA: Diagnosis not present

## 2016-05-01 DIAGNOSIS — R0602 Shortness of breath: Secondary | ICD-10-CM | POA: Diagnosis not present

## 2016-05-01 DIAGNOSIS — I509 Heart failure, unspecified: Secondary | ICD-10-CM | POA: Diagnosis not present

## 2016-05-01 LAB — ECHOCARDIOGRAM COMPLETE
HEIGHTINCHES: 66 in
WEIGHTICAEL: 3097.6 [oz_av]

## 2016-05-01 LAB — GLUCOSE, CAPILLARY
GLUCOSE-CAPILLARY: 278 mg/dL — AB (ref 65–99)
GLUCOSE-CAPILLARY: 208 mg/dL — AB (ref 65–99)
GLUCOSE-CAPILLARY: 256 mg/dL — AB (ref 65–99)
Glucose-Capillary: 159 mg/dL — ABNORMAL HIGH (ref 65–99)
Glucose-Capillary: 265 mg/dL — ABNORMAL HIGH (ref 65–99)

## 2016-05-01 MED ORDER — FUROSEMIDE 40 MG PO TABS
40.0000 mg | ORAL_TABLET | Freq: Two times a day (BID) | ORAL | Status: DC
  Administered 2016-05-01 – 2016-05-03 (×4): 40 mg via ORAL
  Filled 2016-05-01 (×4): qty 1

## 2016-05-01 MED ORDER — PERFLUTREN LIPID MICROSPHERE
1.0000 mL | INTRAVENOUS | Status: AC | PRN
  Administered 2016-05-01: 2 mL via INTRAVENOUS
  Filled 2016-05-01: qty 10

## 2016-05-01 MED ORDER — INSULIN ASPART 100 UNIT/ML ~~LOC~~ SOLN
0.0000 [IU] | Freq: Three times a day (TID) | SUBCUTANEOUS | Status: DC
  Administered 2016-05-02: 7 [IU] via SUBCUTANEOUS

## 2016-05-01 MED ORDER — PERFLUTREN LIPID MICROSPHERE
1.0000 mL | INTRAVENOUS | Status: AC | PRN
  Filled 2016-05-01 (×2): qty 10

## 2016-05-01 MED ORDER — INSULIN ASPART 100 UNIT/ML ~~LOC~~ SOLN
0.0000 [IU] | Freq: Every day | SUBCUTANEOUS | Status: DC
  Administered 2016-05-01 – 2016-05-02 (×2): 3 [IU] via SUBCUTANEOUS
  Administered 2016-05-03: 2 [IU] via SUBCUTANEOUS

## 2016-05-01 NOTE — Progress Notes (Signed)
Subjective:  Breathing better except cough at night Barely weight bearing due to charcot joint   Objective:  Vitals:   04/30/16 1938 05/01/16 0000 05/01/16 0448 05/01/16 0818  BP: 113/63 138/75 116/62   Pulse: 70 69 64   Resp: 18 18 18    Temp: 98.2 F (36.8 C) 97.5 F (36.4 C) 97.8 F (36.6 C)   TempSrc: Oral Oral Oral   SpO2: 100% 100% 100% 100%  Weight:   193 lb 9.6 oz (87.8 kg)   Height:        Intake/Output from previous day:  Intake/Output Summary (Last 24 hours) at 05/01/16 1020 Last data filed at 05/01/16 0900  Gross per 24 hour  Intake              840 ml  Output             1125 ml  Net             -285 ml    Physical Exam: Affect appropriate Chronically ill white male  HEENT: Blind / poor vison  Neck supple with no adenopathy JVP normal no bruits no thyromegaly Lungs clear with no wheezing and good diaphragmatic motion Heart:  S1/S2 no murmur, no rub, gallop or click PMI normal Abdomen: benighn, BS positve, no tenderness, no AAA no bruit.  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 No results for input(s): LIPASE, AMYLASE in the last 72 hours. CBC:  Recent Labs  04/29/16 1025 04/29/16 1807 04/30/16 0406  WBC 5.3 5.9 7.4  NEUTROABS 3.2  --   --   HGB 11.7* 12.1* 11.5*  HCT 35.9* 37.4* 36.0*  MCV 87.3 87.0 86.3  PLT 193 194 197   Cardiac Enzymes:  Recent Labs  04/29/16 1807 04/29/16 2228 04/30/16 0406  TROPONINI 0.56* 0.51* 0.49*     Imaging: Dg Chest 2 View  Result Date: 04/29/2016 CLINICAL DATA:  Shortness of breath. EXAM: CHEST  2 VIEW COMPARISON:  Radiographs of March 26, 2016. FINDINGS: The heart size and mediastinal contours are within normal limits. No pneumothorax is noted. Mild central pulmonary vascular congestion is noted. Possible minimal bibasilar pulmonary edema may be present. Minimal bilateral pleural effusions are noted. The visualized skeletal structures are unremarkable. IMPRESSION: Mild central pulmonary vascular congestion. Minimal bilateral pleural effusions are noted. Possible minimal bibasilar pulmonary edema. Electronically Signed   By: Lupita Raider, M.D.   On: 04/29/2016 10:59    Cardiac Studies:  ECG: SR poor R wave progression    Telemetry: NSR   Echo: pending   Medications:   . acidophilus  1 capsule Oral Daily  . aspirin EC  81 mg Oral Daily  . atropine  1 drop Both Eyes TID  . timolol  1 drop Right Eye BID   And  . brimonidine  1 drop Right Eye BID  . clopidogrel  75 mg Oral Daily  . enoxaparin (LOVENOX) injection  40 mg Subcutaneous Q24H  . famotidine  20 mg Oral Daily  . fludrocortisone  100 mcg Oral BID  . furosemide  40 mg Intravenous Daily  . gabapentin  600 mg Oral TID  . insulin pump   Subcutaneous TID AC, HS, 0200  . isosorbide mononitrate  120 mg Oral BID  . latanoprost  1 drop Both Eyes QHS  . losartan  12.5 mg Oral Daily  . midodrine  10 mg Oral BID  . potassium chloride SA  20 mEq Oral Daily  . ranolazine  500 mg Oral BID  . rosuvastatin  40 mg Oral q1800  . senna-docusate  1 tablet Oral Daily  . 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 Mild CHF by CXR, BNP and exam Good diuresis change Lasix to PO  Have added Ranexa not clear if dyspnea is anginal equivalent. Poor functional status continue PT/OT Using  insulin pump in hospital. Repeat echo pending EF  50-55% by echo 03/20/15 Chronically elevated troponin No chest pain currenlty  If echo ok consider d/c in am   Timothy Alvarez Timothy Alvarez 05/01/2016, 10:20 AM

## 2016-05-01 NOTE — Progress Notes (Signed)
  Echocardiogram 2D Echocardiogram with Definity has been performed.  Nolon RodBrown, Tony 05/01/2016, 11:15 AM

## 2016-05-01 NOTE — Progress Notes (Addendum)
Patient ID: Timothy Alvarez, male   DOB: Jun 11, 1955, 61 y.o.   MRN: [id number]    PROGRESS NOTE    Timothy Alvarez  HQI:[id number] DOB: 11-Aug-1955 DOA: 04/29/2016  PCP: Marisue Brooklyn   Brief Narrative:  61 y.o. male with medical history significant of multivessel CAD (severe diffuse CAD by cath in 03/2015, declined CABG in the past), IDDM, chronic diastolic CHF (EF 13-24% by echo in 03/2015), HTN, and HLD presented to [facility] with main concern of several days duration of progressively worsening dyspnea that initially started with exertion and has progressed to dyspnea at rest in the past 24 hours. This as associated with 14 lbs weight gain, difficulty with ambulation and occasional but not consistent chest pressure. Pt denies fevers, chills, no specific abd or urinary concerns, no sick contacts or exposures. He was given 40 mg IV Lasix in ED reports feeling better at this time, has required foley cath placement as well due to urinary retention. GHe was transferred to [facility] for further evaluation and cardiology team was consul.ted for assistance  Assessment & Plan:   Active Problems:   Dyspnea  - in the setting of fluid overload - started on Lasix 40 mg IV BID, now transitioned to oral regimen 40 mg PO BID - weight trend since admission: Filed Weights   04/29/16 1624 04/30/16 0555 05/01/16 0448  Weight: 88.1 kg (194 lb 3.2 oz) 87 kg (191 lb 12.8 oz) 87.8 kg (193 lb 9.6 oz)  - ECHO done this AM, EF down to 30-35% with grade II diastolic CHF - cardiology to provide further rec's - plan for repeat CXR in AM    Acute kidney injury - from lasix, stable in the past 24 hours  - BMP in AM    DM type II - on insulin pump which we are continuing here     HTN - continue home medical regimen     Obesity  - Body mass index is 31.34 kg/m.  DVT prophylaxis: Lovenox SQ Code Status: Full  Family Communication: Patient at bedside  Disposition Plan: Home possibly in AM    Consultants:   Cardiology   Procedures:   ECHO 2/17 -->  Antimicrobials:   None   Subjective: Still some exertional dyspnea, mostly at night time.   Objective: Vitals:   05/01/16 0000 05/01/16 0448 05/01/16 0818 05/01/16 1211  BP: 138/75 116/62  122/64  Pulse: 69 64  76  Resp: 18 18  18   Temp: 97.5 F (36.4 C) 97.8 F (36.6 C)  97.8 F (36.6 C)  TempSrc: Oral Oral  Oral  SpO2: 100% 100% 100% 100%  Weight:  87.8 kg (193 lb 9.6 oz)    Height:        Intake/Output Summary (Last 24 hours) at 05/01/16 1246 Last data filed at 05/01/16 1122  Gross per 24 hour  Intake              843 ml  Output             1125 ml  Net             -282 ml   Filed Weights   04/29/16 1624 04/30/16 0555 05/01/16 0448  Weight: 88.1 kg (194 lb 3.2 oz) 87 kg (191 lb 12.8 oz) 87.8 kg (193 lb 9.6 oz)   Examination:  General exam: Appears calm and comfortable  Respiratory system: Clear to auscultation. Respiratory effort normal. Cardiovascular system: S1 & S2 heard, RRR.  No JVD, murmurs, rubs, gallops or clicks. +1 bilateral LE pitting edema  Gastrointestinal system: Abdomen is nondistended, soft and nontender. No organomegaly or masses felt. Normal bowel sounds heard. Central nervous system: Alert and oriented. No focal neurological deficits. Extremities: Symmetric 5 x 5 power. Skin: No rashes, lesions or ulcers Psychiatry: Judgement and insight appear normal. Mood & affect appropriate.    Data Reviewed: I have personally reviewed following labs and imaging studies  CBC:  Recent Labs Lab 04/29/16 1025 04/29/16 1807 04/30/16 0406  WBC 5.3 5.9 7.4  NEUTROABS 3.2  --   --   HGB 11.7* 12.1* 11.5*  HCT 35.9* 37.4* 36.0*  MCV 87.3 87.0 86.3  PLT 193 194 197   Basic Metabolic Panel:  Recent Labs Lab 04/29/16 1025 04/29/16 1807 04/30/16 0406  NA 141  --  141  K 3.6  --  3.6  CL 104  --  101  CO2 29  --  30  GLUCOSE 96  --  199*  BUN 19  --  17  CREATININE 1.15 1.25*  1.28*  CALCIUM 8.4*  --  8.4*   Cardiac Enzymes:  Recent Labs Lab 04/29/16 1807 04/29/16 2228 04/30/16 0406  TROPONINI 0.56* 0.51* 0.49*   BNP (last 3 results)  Recent Labs  02/10/16 1030 02/17/16 1245  PROBNP 477.0* 695.0*   CBG:  Recent Labs Lab 04/30/16 1132 04/30/16 1621 04/30/16 2234 05/01/16 0728 05/01/16 1121  GLUCAP 218* 107* 152* 159* 265*   Urine analysis:    Component Value Date/Time   COLORURINE YELLOW 04/29/2016 1450   APPEARANCEUR CLEAR 04/29/2016 1450   LABSPEC 1.009 04/29/2016 1450   PHURINE 7.5 04/29/2016 1450   GLUCOSEU NEGATIVE 04/29/2016 1450   GLUCOSEU 500 (A) 04/07/2015 1418   HGBUR NEGATIVE 04/29/2016 1450   BILIRUBINUR NEGATIVE 04/29/2016 1450   BILIRUBINUR neg 04/22/2015 1130   KETONESUR NEGATIVE 04/29/2016 1450   PROTEINUR NEGATIVE 04/29/2016 1450   UROBILINOGEN 0.2 04/22/2015 1130   UROBILINOGEN 0.2 04/07/2015 1418   NITRITE NEGATIVE 04/29/2016 1450   LEUKOCYTESUR NEGATIVE 04/29/2016 1450     Radiology Studies: No results found.  Scheduled Meds: . acidophilus  1 capsule Oral Daily  . aspirin EC  81 mg Oral Daily  . atropine  1 drop Both Eyes TID  . timolol  1 drop Right Eye BID   And  . brimonidine  1 drop Right Eye BID  . clopidogrel  75 mg Oral Daily  . enoxaparin (LOVENOX) injection  40 mg Subcutaneous Q24H  . famotidine  20 mg Oral Daily  . fludrocortisone  100 mcg Oral BID  . furosemide  40 mg Oral BID  . gabapentin  600 mg Oral TID  . insulin pump   Subcutaneous TID AC, HS, 0200  . isosorbide mononitrate  120 mg Oral BID  . latanoprost  1 drop Both Eyes QHS  . losartan  12.5 mg Oral Daily  . midodrine  10 mg Oral BID  . potassium chloride SA  20 mEq Oral Daily  . ranolazine  500 mg Oral BID  . rosuvastatin  40 mg Oral q1800  . senna-docusate  1 tablet Oral Daily  . sodium chloride flush  3 mL Intravenous Q12H   Continuous Infusions:   LOS: 2 days   Time spent: 20 minutes   Debbora PrestoMAGICK-Brock Larmon,  MD [facility] Pager [phone]  If 7PM-7AM, please contact night-coverage [email or web address] Password [email or web address] 05/01/2016, 12:46 PM

## 2016-05-02 ENCOUNTER — Inpatient Hospital Stay (HOSPITAL_COMMUNITY): Payer: Medicare HMO

## 2016-05-02 DIAGNOSIS — I5023 Acute on chronic systolic (congestive) heart failure: Secondary | ICD-10-CM | POA: Diagnosis not present

## 2016-05-02 DIAGNOSIS — R0602 Shortness of breath: Secondary | ICD-10-CM | POA: Diagnosis not present

## 2016-05-02 LAB — BASIC METABOLIC PANEL
Anion gap: 11 (ref 5–15)
BUN: 22 mg/dL — ABNORMAL HIGH (ref 6–20)
CHLORIDE: 97 mmol/L — AB (ref 101–111)
CO2: 26 mmol/L (ref 22–32)
CREATININE: 1.59 mg/dL — AB (ref 0.61–1.24)
Calcium: 8.7 mg/dL — ABNORMAL LOW (ref 8.9–10.3)
GFR, EST AFRICAN AMERICAN: 52 mL/min — AB (ref >60)
GFR, EST NON AFRICAN AMERICAN: 45 mL/min — AB (ref >60)
Glucose, Bld: 434 mg/dL — ABNORMAL HIGH (ref 65–99)
POTASSIUM: 4.5 mmol/L (ref 3.5–5.1)
SODIUM: 134 mmol/L — AB (ref 135–145)

## 2016-05-02 LAB — BRAIN NATRIURETIC PEPTIDE: B Natriuretic Peptide: 208 pg/mL — ABNORMAL HIGH (ref 0.0–100.0)

## 2016-05-02 LAB — GLUCOSE, CAPILLARY
GLUCOSE-CAPILLARY: 406 mg/dL — AB (ref 65–99)
GLUCOSE-CAPILLARY: 339 mg/dL — AB (ref 65–99)
GLUCOSE-CAPILLARY: 404 mg/dL — AB (ref 65–99)
GLUCOSE-CAPILLARY: 289 mg/dL — AB (ref 65–99)
GLUCOSE-CAPILLARY: 278 mg/dL — AB (ref 65–99)

## 2016-05-02 MED ORDER — INSULIN ASPART 100 UNIT/ML ~~LOC~~ SOLN
6.0000 [IU] | Freq: Once | SUBCUTANEOUS | Status: AC
  Administered 2016-05-02: 6 [IU] via SUBCUTANEOUS

## 2016-05-02 MED ORDER — ALBUTEROL SULFATE (2.5 MG/3ML) 0.083% IN NEBU
2.5000 mg | INHALATION_SOLUTION | Freq: Four times a day (QID) | RESPIRATORY_TRACT | Status: DC | PRN
  Administered 2016-05-02: 2.5 mg via RESPIRATORY_TRACT
  Filled 2016-05-02: qty 3

## 2016-05-02 MED ORDER — CARVEDILOL 3.125 MG PO TABS
3.1250 mg | ORAL_TABLET | Freq: Two times a day (BID) | ORAL | Status: DC
  Administered 2016-05-02 – 2016-05-03 (×2): 3.125 mg via ORAL
  Filled 2016-05-02 (×2): qty 1

## 2016-05-02 MED ORDER — CIPROFLOXACIN HCL 0.3 % OP SOLN
1.0000 [drp] | OPHTHALMIC | Status: AC
  Administered 2016-05-02 – 2016-05-04 (×9): 1 [drp] via OPHTHALMIC
  Filled 2016-05-02: qty 2.5

## 2016-05-02 MED ORDER — BRIMONIDINE TARTRATE 0.2 % OP SOLN
1.0000 [drp] | Freq: Two times a day (BID) | OPHTHALMIC | Status: DC
  Administered 2016-05-02 – 2016-05-05 (×6): 1 [drp] via OPHTHALMIC
  Filled 2016-05-02: qty 5

## 2016-05-02 MED ORDER — DIFLUPREDNATE 0.05 % OP EMUL: 1.0000 [drp] | Freq: Three times a day (TID) | OPHTHALMIC | Status: DC

## 2016-05-02 MED ORDER — INSULIN ASPART 100 UNIT/ML ~~LOC~~ SOLN
0.0000 [IU] | Freq: Three times a day (TID) | SUBCUTANEOUS | Status: DC
  Administered 2016-05-02: 8 [IU] via SUBCUTANEOUS
  Administered 2016-05-02 – 2016-05-03 (×2): 15 [IU] via SUBCUTANEOUS
  Administered 2016-05-03: 2 [IU] via SUBCUTANEOUS
  Administered 2016-05-03: 8 [IU] via SUBCUTANEOUS

## 2016-05-02 MED ORDER — TIMOLOL MALEATE 0.5 % OP SOLN
1.0000 [drp] | Freq: Two times a day (BID) | OPHTHALMIC | Status: DC
  Administered 2016-05-02 – 2016-05-05 (×6): 1 [drp] via OPHTHALMIC
  Filled 2016-05-02: qty 5

## 2016-05-02 MED ORDER — PREDNISOLONE ACETATE 1 % OP SUSP
1.0000 [drp] | Freq: Two times a day (BID) | OPHTHALMIC | Status: DC
Start: 2016-05-02 — End: 2016-05-05
  Administered 2016-05-02 – 2016-05-05 (×6): 1 [drp] via OPHTHALMIC
  Filled 2016-05-02: qty 1

## 2016-05-02 MED ORDER — ATROPINE SULFATE 1 % OP SOLN
1.0000 [drp] | Freq: Two times a day (BID) | OPHTHALMIC | Status: DC
  Administered 2016-05-03 – 2016-05-05 (×5): 1 [drp] via OPHTHALMIC
  Filled 2016-05-02: qty 2

## 2016-05-02 MED ORDER — CIPROFLOXACIN HCL 0.3 % OP SOLN
1.0000 [drp] | OPHTHALMIC | Status: DC
  Administered 2016-05-04 – 2016-05-05 (×4): 1 [drp] via OPHTHALMIC
  Filled 2016-05-02: qty 2.5

## 2016-05-02 MED ORDER — CIPROFLOXACIN HCL 0.3 % OP SOLN: 1.0000 [drp] | Freq: Three times a day (TID) | OPHTHALMIC | Status: DC

## 2016-05-02 MED ORDER — PREDNISOLONE ACETATE 1 % OP SUSP
1.0000 [drp] | Freq: Two times a day (BID) | OPHTHALMIC | Status: DC
  Filled 2016-05-02: qty 1

## 2016-05-02 MED ORDER — LATANOPROST 0.005 % OP SOLN
1.0000 [drp] | Freq: Every day | OPHTHALMIC | Status: DC
  Administered 2016-05-02 – 2016-05-04 (×3): 1 [drp] via OPHTHALMIC
  Filled 2016-05-02: qty 2.5

## 2016-05-02 MED ORDER — INSULIN ASPART 100 UNIT/ML ~~LOC~~ SOLN
5.0000 [IU] | Freq: Three times a day (TID) | SUBCUTANEOUS | Status: DC
  Administered 2016-05-02 – 2016-05-03 (×4): 5 [IU] via SUBCUTANEOUS

## 2016-05-02 MED ORDER — LEVOFLOXACIN IN D5W 750 MG/150ML IV SOLN
750.0000 mg | INTRAVENOUS | Status: DC
  Administered 2016-05-02: 750 mg via INTRAVENOUS
  Filled 2016-05-02: qty 150

## 2016-05-02 MED ORDER — INSULIN GLARGINE 100 UNIT/ML ~~LOC~~ SOLN
30.0000 [IU] | Freq: Every day | SUBCUTANEOUS | Status: DC
  Administered 2016-05-02 – 2016-05-04 (×3): 30 [IU] via SUBCUTANEOUS
  Filled 2016-05-02 (×3): qty 0.3

## 2016-05-02 NOTE — Evaluation (Signed)
Physical Therapy Evaluation Patient Details Name: Timothy Alvarez MRN: [id number] DOB: [birth year] Today's Date: 05/02/2016   History of Present Illness  Pt is a 61 yo male admitted to hospital w/ progressiviely worsening dyspnea, found to have fluid overload and acute on chronic congestive heart failure. PMH includes; CAD, cataracts, CHF, type 1 diabetes uncontrolled, heart attack x5, and hyperlipidemia.     Clinical Impression  Pt awake, alert and willing to participate in PT eval, stated he was feeling much better today. Performed PT eval and ambulation on room air since pt does not require supplemental O2 at baseline, O2 sat stayed above 90% during session. Pt displays moderate balance deficits due to R visual impairments and NBWing status of the R LE. He required use of knee scooter for ambulation and displayed some safety concerns with avoiding obstacles in the hallway secondary to R visual impairments and required cuing to safely ambulate w/ knee scooter. He displayed minor signs of dyspnea during ambulation and required standing rest breaks, but O2 stayed above 90%. Pt is currently limited in functional mobility due to balance deficits, and cardiopulmonary impairments; he will benefit from skilled PT to correct deficits and improve energy conservation; recommend HHPT following acute hospital stay.     Follow Up Recommendations Home health PT    Equipment Recommendations  None recommended by PT    Recommendations for Other Services       Precautions / Restrictions Precautions Precautions: Fall Restrictions Weight Bearing Restrictions: Yes RLE Weight Bearing: Non weight bearing Other Position/Activity Restrictions: uses knee scooter      Mobility  Bed Mobility Overal bed mobility: Modified Independent Bed Mobility: Supine to Sit           General bed mobility comments: requires increased time to move to sitting at EOB with HOB elevated  Transfers Overall transfer level:  Modified independent Equipment used: Ambulation equipment used (knee scooter with locked brakes)                Ambulation/Gait Ambulation/Gait assistance: Supervision Ambulation Distance (Feet): 150 Feet Assistive device:  (knee scooter) Gait Pattern/deviations: Trunk flexed;Drifts right/left;Step-to pattern     General Gait Details: ambulated w/ use of a knee scooter, hesistant moving to the right secondary to R visual deficits, verbal cuing to help maneuver obstacles in hallway, no LOB, displayed some increase in brething O2 remained above 90% on room air  Stairs            Wheelchair Mobility    Modified Rankin (Stroke Patients Only)       Balance Overall balance assessment: Needs assistance Sitting-balance support: Feet supported Sitting balance-Leahy Scale: Good Sitting balance - Comments: able to maintain upright posture at EOB   Standing balance support: Bilateral upper extremity supported;During functional activity Standing balance-Leahy Scale: Fair Standing balance comment: used knee scooter in stance due to R LE NWBing, requires UE support/AD to maintain standing posture                              Pertinent Vitals/Pain Pain Assessment: Faces Faces Pain Scale: Hurts a little bit Pain Location: L Low back Pain Descriptors / Indicators: Aching Pain Intervention(s): Monitored during session    Home Living Family/patient expects to be discharged to:: Private residence Living Arrangements: Spouse/significant other Available Help at Discharge: Family Type of Home: House Home Access: Stairs to enter Entrance Stairs-Rails: Can reach both Entrance Stairs-Number of Steps: 5 Home Layout: One  level Home Equipment: Walker - 2 wheels;Cane - quad;Grab bars - tub/shower;Other (comment) (knee scooter)      Prior Function Level of Independence: Independent with assistive device(s)         Comments: uses cane or walker for in house ambulation  and then a knee scooter for community ambulation     Hand Dominance   Dominant Hand: Right    Extremity/Trunk Assessment   Upper Extremity Assessment Upper Extremity Assessment: Overall WFL for tasks assessed    Lower Extremity Assessment Lower Extremity Assessment: RLE deficits/detail RLE: Unable to fully assess due to immobilization       Communication   Communication: No difficulties  Cognition Arousal/Alertness: Awake/alert Behavior During Therapy: WFL for tasks assessed/performed Overall Cognitive Status: Within Functional Limits for tasks assessed                      General Comments      Exercises     Assessment/Plan    PT Assessment Patient needs continued PT services  PT Problem List Decreased activity tolerance;Decreased balance;Decreased mobility;Cardiopulmonary status limiting activity       PT Treatment Interventions DME instruction;Gait training;Stair training;Functional mobility training;Balance training;Therapeutic exercise;Therapeutic activities;Patient/family education    PT Goals (Current goals can be found in the Care Plan section)  Acute Rehab PT Goals Patient Stated Goal: return home and improve breathing PT Goal Formulation: With patient Time For Goal Achievement: 05/16/16 Potential to Achieve Goals: Good    Frequency Min 3X/week   Barriers to discharge        Co-evaluation               End of Session Equipment Utilized During Treatment: Gait belt Activity Tolerance: Patient limited by fatigue Patient left: in chair;with call bell/phone within reach Nurse Communication: Mobility status PT Visit Diagnosis: Unsteadiness on feet (R26.81);History of falling (Z91.81)         Time: [id number] PT Time Calculation (min) (ACUTE ONLY): 26 min   Charges:   PT Evaluation $PT Eval Moderate Complexity: 1 Procedure     PT G Codes:         Aloysuis Ribaudo Student PT 05/02/2016, 3:24 PM

## 2016-05-02 NOTE — Progress Notes (Signed)
Pharmacy Antibiotic Note  Timothy Alvarez is a 61 y.o. male admitted on 04/29/2016 with pneumonia.  Pharmacy has been consulted for levaquin dosing.  Plan: Levaquin 750mg  IV q24h Monitor culture data, renal function and clinical course  Height: 5\' 6"  (167.6 cm) Weight: 195 lb 4.8 oz (88.6 kg) IBW/kg (Calculated) : 63.8  Temp (24hrs), Avg:98 F (36.7 C), Min:97.8 F (36.6 C), Max:98.2 F (36.8 C)   Recent Labs Lab 04/29/16 1025 04/29/16 1807 04/30/16 0406 05/02/16 0526  WBC 5.3 5.9 7.4  --   CREATININE 1.15 1.25* 1.28* 1.59*    Estimated Creatinine Clearance: 50.2 mL/min (by C-G formula based on SCr of 1.59 mg/dL (H)).    Allergies  Allergen Reactions  . Contrast Media [Iodinated Diagnostic Agents] Other (See Comments)    Renal Failure  . Metrizamide Other (See Comments)    Renal Failure  . Lisinopril Cough     Arlean Hoppingorey M. Newman PiesBall, PharmD, BCPS Clinical Pharmacist [phone] 05/02/2016 11:07 AM

## 2016-05-02 NOTE — Progress Notes (Signed)
Patient ID: Timothy Alvarez, male   DOB: 21-Nov-1955, 61 y.o.   MRN: [id number]    PROGRESS NOTE    Timothy Alvarez  WUJ:[id number] DOB: 1955/07/13 DOA: 04/29/2016  PCP: Marisue Brooklyn   Brief Narrative:  61 y.o. male with medical history significant of multivessel CAD (severe diffuse CAD by cath in 03/2015, declined CABG in the past), IDDM, chronic diastolic CHF (EF 95-62% by echo in 03/2015), HTN, and HLD presented to [facility] with main concern of several days duration of progressively worsening dyspnea that initially started with exertion and has progressed to dyspnea at rest in the past 24 hours. This as associated with 14 lbs weight gain, difficulty with ambulation and occasional but not consistent chest pressure. Pt denies fevers, chills, no specific abd or urinary concerns, no sick contacts or exposures. He was given 40 mg IV Lasix in ED reports feeling better at this time, has required foley cath placement as well due to urinary retention. GHe was transferred to [facility] for further evaluation and cardiology team was consul.ted for assistance  Assessment & Plan:   Active Problems:   Dyspnea  - in the setting of fluid overload and now possible LLL PNA - started on Lasix 40 mg IV BID, now transitioned to oral regimen 40 mg PO BID since 2/18 - weight trend since admission: Filed Weights   04/30/16 0555 05/01/16 0448 05/02/16 0441  Weight: 87 kg (191 lb 12.8 oz) 87.8 kg (193 lb 9.6 oz) 88.6 kg (195 lb 4.8 oz)  - ECHO with EF down to 30-35% with grade II diastolic CHF - cardiology to provide further rec's    LLL PNA - added Levaquin and pharmacy to assist with dosing - ordered sputum culture if pt able to provide     Acute kidney injury - from lasix, Cr up this AM - BMP in AM    DM type II - started Lantus 30 U QHS - added SSI moderate coverage     HTN - continue home medical regimen  - reasonable inpatient control     Obesity  - Body mass index is 31.34  kg/m.  DVT prophylaxis: Lovenox SQ Code Status: Full  Family Communication: Patient at bedside  Disposition Plan: Home when cardiology team clears   Consultants:   Cardiology   Procedures:   ECHO 2/17 --> EF 30 - 35%  Antimicrobials:   Levaquin 2/19 -->  Subjective: Still intermittent episodes of dyspnea, mostly at night time.   Objective: Vitals:   05/01/16 1932 05/02/16 0441 05/02/16 0956 05/02/16 1227  BP: 128/62 136/71 126/65 119/66  Pulse: 77 81 82 74  Resp: 18 20 16 16   Temp: 98.2 F (36.8 C) 98 F (36.7 C)  98.1 F (36.7 C)  TempSrc: Oral Oral  Oral  SpO2: 100% 99% 100% 99%  Weight:  88.6 kg (195 lb 4.8 oz)    Height:        Intake/Output Summary (Last 24 hours) at 05/02/16 1333 Last data filed at 05/02/16 1025  Gross per 24 hour  Intake              820 ml  Output             2100 ml  Net            -1280 ml   Filed Weights   04/30/16 0555 05/01/16 0448 05/02/16 0441  Weight: 87 kg (191 lb 12.8 oz) 87.8 kg (193 lb 9.6 oz)  88.6 kg (195 lb 4.8 oz)   Examination:  General exam: Appears calm and comfortable  Respiratory system: Mild rhonchi at bases  Cardiovascular system: S1 & S2 heard, RRR. No JVD, rubs, gallops or clicks. +1 bilateral LE pitting edema  Gastrointestinal system: Abdomen is nondistended, soft and nontender. No organomegaly or masses felt. Normal bowel sounds heard. Central nervous system: Alert and oriented. No focal neurological deficits. Psychiatry: Judgement and insight appear normal. Mood & affect appropriate.   Data Reviewed: I have personally reviewed following labs and imaging studies  CBC:  Recent Labs Lab 04/29/16 1025 04/29/16 1807 04/30/16 0406  WBC 5.3 5.9 7.4  NEUTROABS 3.2  --   --   HGB 11.7* 12.1* 11.5*  HCT 35.9* 37.4* 36.0*  MCV 87.3 87.0 86.3  PLT 193 194 197   Basic Metabolic Panel:  Recent Labs Lab 04/29/16 1025 04/29/16 1807 04/30/16 0406 05/02/16 0526  NA 141  --  141 134*  K 3.6  --  3.6  4.5  CL 104  --  101 97*  CO2 29  --  30 26  GLUCOSE 96  --  199* 434*  BUN 19  --  17 22*  CREATININE 1.15 1.25* 1.28* 1.59*  CALCIUM 8.4*  --  8.4* 8.7*   Cardiac Enzymes:  Recent Labs Lab 04/29/16 1807 04/29/16 2228 04/30/16 0406  TROPONINI 0.56* 0.51* 0.49*   BNP (last 3 results)  Recent Labs  02/10/16 1030 02/17/16 1245  PROBNP 477.0* 695.0*   CBG:  Recent Labs Lab 05/01/16 2125 05/01/16 2310 05/02/16 0449 05/02/16 0729 05/02/16 1223  GLUCAP 208* 256* 406* 339* 404*   Urine analysis:    Component Value Date/Time   COLORURINE YELLOW 04/29/2016 1450   APPEARANCEUR CLEAR 04/29/2016 1450   LABSPEC 1.009 04/29/2016 1450   PHURINE 7.5 04/29/2016 1450   GLUCOSEU NEGATIVE 04/29/2016 1450   GLUCOSEU 500 (A) 04/07/2015 1418   HGBUR NEGATIVE 04/29/2016 1450   BILIRUBINUR NEGATIVE 04/29/2016 1450   BILIRUBINUR neg 04/22/2015 1130   KETONESUR NEGATIVE 04/29/2016 1450   PROTEINUR NEGATIVE 04/29/2016 1450   UROBILINOGEN 0.2 04/22/2015 1130   UROBILINOGEN 0.2 04/07/2015 1418   NITRITE NEGATIVE 04/29/2016 1450   LEUKOCYTESUR NEGATIVE 04/29/2016 1450     Radiology Studies: Dg Chest 2 View  Result Date: 05/02/2016 CLINICAL DATA:  Shortness of Breath EXAM: CHEST  2 VIEW COMPARISON:  April 29, 2016 FINDINGS: There remains interstitial edema with cardiomegaly and pulmonary venous hypertension. There is a a new focus of airspace consolidation in the medial left base. No airspace consolidation evident. No adenopathy. No pneumothorax. Bones osteoporotic. IMPRESSION: Evidence a degree of congestive heart failure, stable. New consolidation medial left base. Suspect focal pneumonia, although alveolar edema in this area could present in this manner. No other airspace consolidation. Electronically Signed   By: Bretta Bang III M.D.   On: 05/02/2016 07:08    Scheduled Meds: . acidophilus  1 capsule Oral Daily  . aspirin EC  81 mg Oral Daily  . atropine  1 drop Both  Eyes TID  . timolol  1 drop Right Eye BID   And  . brimonidine  1 drop Right Eye BID  . carvedilol  3.125 mg Oral BID WC  . clopidogrel  75 mg Oral Daily  . enoxaparin (LOVENOX) injection  40 mg Subcutaneous Q24H  . famotidine  20 mg Oral Daily  . fludrocortisone  100 mcg Oral BID  . furosemide  40 mg Oral BID  . gabapentin  600 mg Oral TID  . insulin aspart  0-15 Units Subcutaneous TID WC  . insulin aspart  0-5 Units Subcutaneous QHS  . insulin glargine  30 Units Subcutaneous QHS  . insulin pump   Subcutaneous TID AC, HS, 0200  . isosorbide mononitrate  120 mg Oral BID  . latanoprost  1 drop Both Eyes QHS  . levofloxacin (LEVAQUIN) IV  750 mg Intravenous Q24H  . losartan  12.5 mg Oral Daily  . midodrine  10 mg Oral BID  . potassium chloride SA  20 mEq Oral Daily  . ranolazine  500 mg Oral BID  . rosuvastatin  40 mg Oral q1800  . senna-docusate  1 tablet Oral Daily  . sodium chloride flush  3 mL Intravenous Q12H   Continuous Infusions:   LOS: 3 days   Time spent: 20 minutes   Timothy PrestoMAGICK-Jillien Yakel, MD [facility] Pager [phone]  If 7PM-7AM, please contact night-coverage [email or web address] Password [email or web address] 05/02/2016, 1:33 PM

## 2016-05-02 NOTE — Progress Notes (Signed)
Pt c/o SOB and trouble breathing, sat pt up, turned O2 to 4 L, 02 sats were WNL notified MD, MD ordered Albuterol to help, checked CBG was 406, MD also order 6 units of novolog, gave breathing tx and gave Novolog, after breathng tx pt felt better, will continue to monitor, Thanks Lavonda JumboMike F RN

## 2016-05-02 NOTE — Progress Notes (Signed)
Progress Note  Patient Name: Timothy Alvarez Date of Encounter: 05/02/2016  Primary Cardiologist: Dr. Jens Som  Subjective   Had episode of dyspnea this AM, now improved. Denies any chest discomfort or palpitations.   Inpatient Medications    Scheduled Meds: . acidophilus  1 capsule Oral Daily  . aspirin EC  81 mg Oral Daily  . atropine  1 drop Both Eyes TID  . timolol  1 drop Right Eye BID   And  . brimonidine  1 drop Right Eye BID  . clopidogrel  75 mg Oral Daily  . enoxaparin (LOVENOX) injection  40 mg Subcutaneous Q24H  . famotidine  20 mg Oral Daily  . fludrocortisone  100 mcg Oral BID  . furosemide  40 mg Oral BID  . gabapentin  600 mg Oral TID  . insulin aspart  0-5 Units Subcutaneous QHS  . insulin aspart  0-9 Units Subcutaneous TID WC  . insulin pump   Subcutaneous TID AC, HS, 0200  . isosorbide mononitrate  120 mg Oral BID  . latanoprost  1 drop Both Eyes QHS  . losartan  12.5 mg Oral Daily  . midodrine  10 mg Oral BID  . potassium chloride SA  20 mEq Oral Daily  . ranolazine  500 mg Oral BID  . rosuvastatin  40 mg Oral q1800  . senna-docusate  1 tablet Oral Daily  . sodium chloride flush  3 mL Intravenous Q12H   Continuous Infusions:  PRN Meds: albuterol, benzonatate, HYDROcodone-acetaminophen, meclizine, nitroGLYCERIN, zolpidem   Vital Signs    Vitals:   05/01/16 1211 05/01/16 1932 05/02/16 0441 05/02/16 0956  BP: 122/64 128/62 136/71 126/65  Pulse: 76 77 81 82  Resp: 18 18 20 16   Temp: 97.8 F (36.6 C) 98.2 F (36.8 C) 98 F (36.7 C)   TempSrc: Oral Oral Oral   SpO2: 100% 100% 99% 100%  Weight:   195 lb 4.8 oz (88.6 kg)   Height:        Intake/Output Summary (Last 24 hours) at 05/02/16 1100 Last data filed at 05/02/16 1025  Gross per 24 hour  Intake             1043 ml  Output             2400 ml  Net            -1357 ml   Filed Weights   04/30/16 0555 05/01/16 0448 05/02/16 0441  Weight: 191 lb 12.8 oz (87 kg) 193 lb 9.6 oz (87.8  kg) 195 lb 4.8 oz (88.6 kg)    Telemetry    Sinus rhythm, HR in 70's - 80's.  - Personally Reviewed  ECG    No new tracings.   Physical Exam   General: Well developed, well nourished Caucasian male appearing in no acute distress. Head: Normocephalic, atraumatic.  Neck: Supple without bruits, JVD at 9cm. Lungs:  Resp regular and unlabored, decreased breath sounds along left base. Heart: RRR, S1, S2, no S3, S4, or murmur; no rub. Abdomen: Soft, non-tender, non-distended with normoactive bowel sounds. No hepatomegaly. No rebound/guarding. No obvious abdominal masses. Extremities: No clubbing or cyanosis, trace lower extremity edema. Distal pedal pulses are 2+ bilaterally. Neuro: Alert and oriented X 3. Moves all extremities spontaneously. Psych: Normal affect.  Labs    Chemistry Recent Labs Lab 04/29/16 1025 04/29/16 1807 04/30/16 0406 05/02/16 0526  NA 141  --  141 134*  K 3.6  --  3.6 4.5  CL 104  --  101 97*  CO2 29  --  30 26  GLUCOSE 96  --  199* 434*  BUN 19  --  17 22*  CREATININE 1.15 1.25* 1.28* 1.59*  CALCIUM 8.4*  --  8.4* 8.7*  GFRNONAA >60 >60 58* 45*  GFRAA >60 >60 >60 52*  ANIONGAP 8  --  10 11     Hematology Recent Labs Lab 04/29/16 1025 04/29/16 1807 04/30/16 0406  WBC 5.3 5.9 7.4  RBC 4.11* 4.30 4.17*  HGB 11.7* 12.1* 11.5*  HCT 35.9* 37.4* 36.0*  MCV 87.3 87.0 86.3  MCH 28.5 28.1 27.6  MCHC 32.6 32.4 31.9  RDW 14.5 14.7 14.7  PLT 193 194 197    Cardiac Enzymes Recent Labs Lab 04/29/16 1807 04/29/16 2228 04/30/16 0406  TROPONINI 0.56* 0.51* 0.49*    Recent Labs Lab 04/29/16 1043  TROPIPOC 0.69*     BNP Recent Labs Lab 04/29/16 1025 05/02/16 0526  BNP 303.6* 208.0*     DDimer No results for input(s): DDIMER in the last 168 hours.   Radiology    Dg Chest 2 View  Result Date: 05/02/2016 CLINICAL DATA:  Shortness of Breath EXAM: CHEST  2 VIEW COMPARISON:  April 29, 2016 FINDINGS: There remains interstitial edema  with cardiomegaly and pulmonary venous hypertension. There is a a new focus of airspace consolidation in the medial left base. No airspace consolidation evident. No adenopathy. No pneumothorax. Bones osteoporotic. IMPRESSION: Evidence a degree of congestive heart failure, stable. New consolidation medial left base. Suspect focal pneumonia, although alveolar edema in this area could present in this manner. No other airspace consolidation. Electronically Signed   By: Bretta BangWilliam  Woodruff III M.D.   On: 05/02/2016 07:08    Cardiac Studies   Echocardiogram: 05/01/2016 Study Conclusions  - Left ventricle: The cavity size was normal. Wall thickness was   increased in a pattern of mild LVH. Systolic function was   moderately to severely reduced. The estimated ejection fraction   was in the range of 30% to 35%. Features are consistent with a   pseudonormal left ventricular filling pattern, with concomitant   abnormal relaxation and increased filling pressure (grade 2   diastolic dysfunction). Doppler parameters are consistent with   high ventricular filling pressure. - Regional wall motion abnormality: Severe hypokinesis of the   apical anterior, apical septal, apical lateral, and apical   myocardium; moderate hypokinesis of the mid anterior, mid   anteroseptal, apical inferior, and mid inferolateral myocardium;   mild hypokinesis of the mid inferior myocardium. Basal anterior   and anterolateral hypokinesis. - Aortic valve: Moderately calcified annulus. Trileaflet. - Mitral valve: Severely calcified annulus. There was mild   regurgitation. - Left atrium: The atrium was moderately dilated. - Right ventricle: Systolic function was mildly reduced. - Inferior vena cava: Estimated CVP 15 mmHg.  Patient Profile     61 y.o. male w/ PMH of multivessel CAD (severe diffuse CAD by cath in 03/2015, declined CABG in the past), IDDM, chronic diastolic CHF (EF 16-10%50-55% by echo in 03/2015), HTN, and HLD admitted  on 04/29/2016 for worsening dyspnea. Weight up 14 lbs from baseline despite increasing PO Lasix intake. Echo this admission shows reduced EF of 30-35% with WMA.   Assessment & Plan    1. Acute on Chronic Combined Systolic and Diastolic CHF - EF was 50-55% by echo in 03/2015. Repeat echo this admission shows EF of 30% to 35%, Grade 2 DD, and severe hypokinesis of the  apical anterior, apical septal, apical lateral, and apical myocardium. - BNP 303 on admission, rechecked this AM and at 208. Weight down 4 lbs (198 --> 195 lbs). Was 189 lbs at the time of last office visit on 04/14/2016. Recorded output -4.7L this admission. Creatinine trending up to 1.59 this AM. Switched to PO Lasix yesterday. Continue with PO Lasix 40mg  BID. Repeat BMET in AM. If minimal output, could try switching to PO Torsemide for improved bioavailability. - continue ARB. Was not on BB therapy prior to admission secondary to history of bradycardia. Patient cannot remember what BB therapy/dosing he was on in the past. HR in 70-80's on telemetry. Would challenge with addition of low-dose Coreg 3.125mg  BID. Monitor on telemetry.  2. CAD/ Elevated Troponin - severe diffuse CAD by cath in 03/2015, PCI was thought to be high-risk at the time of his last cath and not felt to be a good CABG candidate secondary to poor targets. Cath not pursued in 03/2016 due to the patient declining further workup and it would not likely change the plan.  - troponin values flat at 0.51 and 0.49, similar to values during 03/2016 admission. No plans for further ischemic evaluation at this time.   3. Essential HTN - BP at 122/62 - 136/71 in the past 24 hours. - continue Losartan and Imdur.   4. Stage 3 CKD - baseline creatinine 1.3 - 1.4. - at 1.59 this AM.   5. Type 2 DM - CBG 406 this AM. - per admitting team  6. Abnormal CXR - CXR this AM shows degree of CHF with consolidation along medial left base, suspicious of focal PNA. - started on Levaquin  by admitting team.    Patient was listed as Full Code prior to this encounter. If discussing Code Status with the patient he wishes for BiPAP and ACLS medications to be utilized if necessary. No Intubation or CPR. This has been updated in his record. 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   Signed, Ellsworth Lennox , PA-C 11:00 AM 05/02/2016 Pager: [phone]  I have seen and examined the patient along with Ellsworth Lennox , PA-C.  I have reviewed the chart, notes and new data.  I agree with PA's note.  Key new complaints: still has orthopnea, dyspnea if he talks for a longer time. Key examination changes: JVD +, minimal leg edema (1+ on R pretibial, none on L) Key new findings / data: slight worsening of creatinine.  PLAN: Probably still at least 5-6 lb above "dry weight". Continue diuretics. Consider occasional metolazone "booster". It is reasonable to try a very low dose of carvedilol as anti-ischemic and HF med, but would wait until he can lie flat before starting it.  Thurmon Fair, MD, [facility] [phone] 05/02/2016, 2:30 PM

## 2016-05-02 NOTE — Progress Notes (Signed)
Patients CBG 404,  Insulin pump was removed by patient last night per report because he run out of Insulin pump.  Diabetes Coordinator following and was able to talk to the patient. MD paged of above CBG and made orders to changed SSI coverage to moderate and added Lantus @ HS.

## 2016-05-02 NOTE — Progress Notes (Signed)
Inpatient Diabetes Program Recommendations  AACE/ADA: New Consensus Statement on Inpatient Glycemic Control (2015)  Target Ranges:  Prepandial:   less than 140 mg/dL      Peak postprandial:   less than 180 mg/dL (1-2 hours)      Critically ill patients:  140 - 180 mg/dL   Review of Glycemic Control  Diabetes history: DM 1 Outpatient Diabetes medications: (no longer has Omnipod pump and supplies due to switching insurance providers) Humalog SSI 0-15 units, Lantus 30 Daily Current orders for Inpatient glycemic control: Novolog Sensitive + HS scale  Inpatient Diabetes Program Recommendations:   Consider Lantus 30 Daily and increasing Novolog To moderate correction TID.  Thanks,  Christena DeemShannon Trygg Mantz RN, MSN, [facility] Inpatient Diabetes Coordinator Team Pager [phone] (8a-5p)

## 2016-05-03 ENCOUNTER — Ambulatory Visit: Payer: Medicare HMO | Admitting: Podiatry

## 2016-05-03 DIAGNOSIS — R0602 Shortness of breath: Secondary | ICD-10-CM | POA: Diagnosis not present

## 2016-05-03 DIAGNOSIS — I5023 Acute on chronic systolic (congestive) heart failure: Secondary | ICD-10-CM | POA: Diagnosis not present

## 2016-05-03 LAB — CBC
HCT: 34.9 % — ABNORMAL LOW (ref 39.0–52.0)
HEMOGLOBIN: 11.2 g/dL — AB (ref 13.0–17.0)
MCH: 27.7 pg (ref 26.0–34.0)
MCHC: 32.1 g/dL (ref 30.0–36.0)
MCV: 86.4 fL (ref 78.0–100.0)
PLATELETS: 222 10*3/uL (ref 150–400)
RBC: 4.04 MIL/uL — AB (ref 4.22–5.81)
RDW: 14.7 % (ref 11.5–15.5)
WBC: 8.5 10*3/uL (ref 4.0–10.5)

## 2016-05-03 LAB — GLUCOSE, CAPILLARY
GLUCOSE-CAPILLARY: 262 mg/dL — AB (ref 65–99)
GLUCOSE-CAPILLARY: 389 mg/dL — AB (ref 65–99)
Glucose-Capillary: 135 mg/dL — ABNORMAL HIGH (ref 65–99)
Glucose-Capillary: 201 mg/dL — ABNORMAL HIGH (ref 65–99)

## 2016-05-03 LAB — BASIC METABOLIC PANEL
Anion gap: 8 (ref 5–15)
BUN: 24 mg/dL — AB (ref 6–20)
CHLORIDE: 100 mmol/L — AB (ref 101–111)
CO2: 29 mmol/L (ref 22–32)
CREATININE: 1.61 mg/dL — AB (ref 0.61–1.24)
Calcium: 8.9 mg/dL (ref 8.9–10.3)
GFR calc Af Amer: 51 mL/min — ABNORMAL LOW (ref >60)
GFR, EST NON AFRICAN AMERICAN: 44 mL/min — AB (ref >60)
Glucose, Bld: 212 mg/dL — ABNORMAL HIGH (ref 65–99)
Potassium: 4 mmol/L (ref 3.5–5.1)
SODIUM: 137 mmol/L (ref 135–145)

## 2016-05-03 LAB — HEMOGLOBIN A1C
HEMOGLOBIN A1C: 7.5 % — AB (ref 4.8–5.6)
MEAN PLASMA GLUCOSE: 169 mg/dL

## 2016-05-03 MED ORDER — INSULIN ASPART 100 UNIT/ML ~~LOC~~ SOLN
5.0000 [IU] | Freq: Three times a day (TID) | SUBCUTANEOUS | Status: DC
  Administered 2016-05-04 – 2016-05-05 (×3): 5 [IU] via SUBCUTANEOUS

## 2016-05-03 MED ORDER — INSULIN ASPART 100 UNIT/ML ~~LOC~~ SOLN
0.0000 [IU] | Freq: Three times a day (TID) | SUBCUTANEOUS | Status: DC
  Administered 2016-05-04: 3 [IU] via SUBCUTANEOUS
  Administered 2016-05-04: 8 [IU] via SUBCUTANEOUS
  Administered 2016-05-05: 11 [IU] via SUBCUTANEOUS

## 2016-05-03 MED ORDER — CARVEDILOL 3.125 MG PO TABS
3.1250 mg | ORAL_TABLET | Freq: Two times a day (BID) | ORAL | Status: DC
  Administered 2016-05-03: 3.125 mg via ORAL
  Filled 2016-05-03: qty 1

## 2016-05-03 MED ORDER — FUROSEMIDE 40 MG PO TABS
40.0000 mg | ORAL_TABLET | Freq: Two times a day (BID) | ORAL | Status: AC
  Administered 2016-05-03: 40 mg via ORAL
  Filled 2016-05-03: qty 1

## 2016-05-03 MED ORDER — CARVEDILOL 3.125 MG PO TABS
3.1250 mg | ORAL_TABLET | Freq: Two times a day (BID) | ORAL | Status: DC
  Administered 2016-05-04 – 2016-05-05 (×4): 3.125 mg via ORAL
  Filled 2016-05-03 (×4): qty 1

## 2016-05-03 MED ORDER — LEVOFLOXACIN 750 MG PO TABS
750.0000 mg | ORAL_TABLET | ORAL | Status: DC
  Administered 2016-05-03 – 2016-05-04 (×2): 750 mg via ORAL
  Filled 2016-05-03 (×2): qty 1

## 2016-05-03 MED ORDER — METOLAZONE 2.5 MG PO TABS
2.5000 mg | ORAL_TABLET | Freq: Once | ORAL | Status: AC
  Administered 2016-05-03: 2.5 mg via ORAL
  Filled 2016-05-03: qty 1

## 2016-05-03 NOTE — Progress Notes (Addendum)
Patient ID: Timothy Alvarez, male   DOB: [birth year], 61 y.o.   MRN: [id number]    PROGRESS NOTE  Timothy Alvarez  WUJ:[id number] DOB: [birth year] DOA: 04/29/2016  PCP: Marisue Brooklyn   Brief Narrative:  61 y.o. male with medical history significant of multivessel CAD (severe diffuse CAD by cath in 03/2015, declined CABG in the past), IDDM, chronic diastolic CHF (EF 95-62% by echo in 03/2015), HTN, and HLD presented to [facility] with main concern of several days duration of progressively worsening dyspnea that initially started with exertion and has progressed to dyspnea at rest in the past 24 hours. This as associated with 14 lbs weight gain, difficulty with ambulation and occasional but not consistent chest pressure. Pt denies fevers, chills, no specific abd or urinary concerns, no sick contacts or exposures. He was given 40 mg IV Lasix in ED reports feeling better at this time, has required foley cath placement as well due to urinary retention. GHe was transferred to [facility] for further evaluation and cardiology team was consul.ted for assistance  Assessment & Plan:   Active Problems:   Dyspnea due to acute systolic and diastolic CHF  - in the setting of fluid overload and now possible LLL PNA - started on Lasix 40 mg IV BID, now transitioned to oral regimen 40 mg PO BID since 2/18 - weight trend since admission: Filed Weights   05/01/16 0448 05/02/16 0441 05/03/16 0508  Weight: 87.8 kg (193 lb 9.6 oz) 88.6 kg (195 lb 4.8 oz) 89.5 kg (197 lb 4.8 oz)  - ECHO with EF down to 30-35% with grade II diastolic CHF, severe hypokinesis of the apical anterior, apical septal, apical lateral, and apical myocardium. - BNP 303 on admission, rechecked on 2/19 and at 208 - per cardiology, will give Metolazone dosing today, 30 minutes prior to evening Lasix dose. If minimal output with this, consider transition to Torsemide.  - continue ARB. Was not on BB therapy prior to admission secondary to  history of bradycardia    LLL PNA - added Levaquin and pharmacy to assist with dosing - ordered sputum culture if pt able to provide  - continue Levaquin day #2    Acute kidney injury - from lasix, Cr up this AM - BMP in AM    Hyponatremia - resolved     DM type II - started Lantus 30 U QHS - added SSI moderate coverage  - A1C 7.5 (05/01/2016)    HTN, essential  - continue home medical regimen  - reasonable inpatient control, SBP in 140's this AM     Obesity  - Body mass index is 31.34 kg/m.  DVT prophylaxis: Lovenox SQ Code Status: No CPR and no Intubation, otherwise ok to initiate rapid response if needed  Family Communication: Patient at bedside  Disposition Plan: Home when cardiology team clears   Consultants:   Cardiology   Procedures:   ECHO 2/17 --> EF 30 - 35%  Antimicrobials:   Levaquin 2/19 -->  Subjective: Still intermittent episodes of dyspnea, mostly at night time.   Objective: Vitals:   05/02/16 1453 05/02/16 2101 05/03/16 0508 05/03/16 0752  BP:  129/73 (!) 147/68 (!) 149/69  Pulse:  78 82 88  Resp:  20  17  Temp:  98.2 F (36.8 C) 98 F (36.7 C) 98.7 F (37.1 C)  TempSrc:  Oral Oral Oral  SpO2: 93% 95% 94% 93%  Weight:   89.5 kg (197 lb 4.8 oz)  Height:        Intake/Output Summary (Last 24 hours) at 05/03/16 1301 Last data filed at 05/03/16 1214  Gross per 24 hour  Intake              222 ml  Output             1450 ml  Net            -1228 ml   Filed Weights   05/01/16 0448 05/02/16 0441 05/03/16 0508  Weight: 87.8 kg (193 lb 9.6 oz) 88.6 kg (195 lb 4.8 oz) 89.5 kg (197 lb 4.8 oz)   Examination:  General exam: Appears calm and comfortable  Respiratory system: Mild rhonchi at bases  Cardiovascular system: S1 & S2 heard, RRR. No JVD, rubs, gallops or clicks. +1 bilateral LE pitting edema  Gastrointestinal system: Abdomen is nondistended, soft and nontender. No organomegaly or masses felt. Normal bowel sounds  heard. Central nervous system: Alert and oriented. No focal neurological deficits. Psychiatry: Judgement and insight appear normal. Mood & affect appropriate.   Data Reviewed: I have personally reviewed following labs and imaging studies  CBC:  Recent Labs Lab 04/29/16 1025 04/29/16 1807 04/30/16 0406 05/03/16 0357  WBC 5.3 5.9 7.4 8.5  NEUTROABS 3.2  --   --   --   HGB 11.7* 12.1* 11.5* 11.2*  HCT 35.9* 37.4* 36.0* 34.9*  MCV 87.3 87.0 86.3 86.4  PLT 193 194 197 222   Basic Metabolic Panel:  Recent Labs Lab 04/29/16 1025 04/29/16 1807 04/30/16 0406 05/02/16 0526 05/03/16 0357  NA 141  --  141 134* 137  K 3.6  --  3.6 4.5 4.0  CL 104  --  101 97* 100*  CO2 29  --  30 26 29   GLUCOSE 96  --  199* 434* 212*  BUN 19  --  17 22* 24*  CREATININE 1.15 1.25* 1.28* 1.59* 1.61*  CALCIUM 8.4*  --  8.4* 8.7* 8.9   Cardiac Enzymes:  Recent Labs Lab 04/29/16 1807 04/29/16 2228 04/30/16 0406  TROPONINI 0.56* 0.51* 0.49*   BNP (last 3 results)  Recent Labs  02/10/16 1030 02/17/16 1245  PROBNP 477.0* 695.0*   CBG:  Recent Labs Lab 05/02/16 1223 05/02/16 1626 05/02/16 2058 05/03/16 0742 05/03/16 1158  GLUCAP 404* 289* 278* 262* 389*   Urine analysis:    Component Value Date/Time   COLORURINE YELLOW 04/29/2016 1450   APPEARANCEUR CLEAR 04/29/2016 1450   LABSPEC 1.009 04/29/2016 1450   PHURINE 7.5 04/29/2016 1450   GLUCOSEU NEGATIVE 04/29/2016 1450   GLUCOSEU 500 (A) 04/07/2015 1418   HGBUR NEGATIVE 04/29/2016 1450   BILIRUBINUR NEGATIVE 04/29/2016 1450   BILIRUBINUR neg 04/22/2015 1130   KETONESUR NEGATIVE 04/29/2016 1450   PROTEINUR NEGATIVE 04/29/2016 1450   UROBILINOGEN 0.2 04/22/2015 1130   UROBILINOGEN 0.2 04/07/2015 1418   NITRITE NEGATIVE 04/29/2016 1450   LEUKOCYTESUR NEGATIVE 04/29/2016 1450     Radiology Studies: Dg Chest 2 View  Result Date: 05/02/2016 CLINICAL DATA:  Shortness of Breath EXAM: CHEST  2 VIEW COMPARISON:  April 29, 2016 FINDINGS: There remains interstitial edema with cardiomegaly and pulmonary venous hypertension. There is a a new focus of airspace consolidation in the medial left base. No airspace consolidation evident. No adenopathy. No pneumothorax. Bones osteoporotic. IMPRESSION: Evidence a degree of congestive heart failure, stable. New consolidation medial left base. Suspect focal pneumonia, although alveolar edema in this area could present in this manner. No other airspace consolidation.  Electronically Signed   By: Bretta BangWilliam  Woodruff III M.D.   On: 05/02/2016 07:08    Scheduled Meds: . acidophilus  1 capsule Oral Daily  . aspirin EC  81 mg Oral Daily  . atropine  1 drop Right Eye BID  . brimonidine  1 drop Left Eye BID   And  . timolol  1 drop Left Eye BID  . carvedilol  3.125 mg Oral BID WC  . ciprofloxacin  1 drop Right Eye Q2H while awake  . [START ON 05/04/2016] ciprofloxacin  1 drop Right Eye Q4H while awake  . clopidogrel  75 mg Oral Daily  . enoxaparin (LOVENOX) injection  40 mg Subcutaneous Q24H  . famotidine  20 mg Oral Daily  . fludrocortisone  100 mcg Oral BID  . furosemide  40 mg Oral BID  . gabapentin  600 mg Oral TID  . insulin aspart  0-15 Units Subcutaneous TID WC  . insulin aspart  0-5 Units Subcutaneous QHS  . insulin aspart  5 Units Subcutaneous TID WC  . insulin glargine  30 Units Subcutaneous QHS  . isosorbide mononitrate  120 mg Oral BID  . latanoprost  1 drop Left Eye QHS  . levofloxacin  750 mg Oral Q24H  . losartan  12.5 mg Oral Daily  . metolazone  2.5 mg Oral Once  . midodrine  10 mg Oral BID  . potassium chloride SA  20 mEq Oral Daily  . prednisoLONE acetate  1 drop Right Eye BID  . ranolazine  500 mg Oral BID  . rosuvastatin  40 mg Oral q1800  . senna-docusate  1 tablet Oral Daily  . sodium chloride flush  3 mL Intravenous Q12H   Continuous Infusions:   LOS: 4 days   Time spent: 20 minutes   Debbora PrestoMAGICK-Kacin Dancy, MD [facility] Pager  [phone]  If 7PM-7AM, please contact night-coverage [email or web address] Password [email or web address] 05/03/2016, 1:01 PM

## 2016-05-03 NOTE — Progress Notes (Signed)
PHARMACIST - PHYSICIAN COMMUNICATION DR:   Izola PriceMyers CONCERNING: Antibiotic IV to Oral Route Change Policy  RECOMMENDATION: This patient is receiving levaquin by the intravenous route.  Based on criteria approved by the Pharmacy and Therapeutics Committee, the antibiotic(s) is/are being converted to the equivalent oral dose form(s).   DESCRIPTION: These criteria include:  Patient being treated for a respiratory tract infection, urinary tract infection, cellulitis or clostridium difficile associated diarrhea if on metronidazole  The patient is not neutropenic and does not exhibit a GI malabsorption state  The patient is eating (either orally or via tube) and/or has been taking other orally administered medications for a least 24 hours  The patient is improving clinically and has a Tmax < 100.5  If you have questions about this conversion, please contact the Pharmacy Department  []   [phone]( [phone] )  [facility] []   [phone]( [phone] )  [facility] []   [phone]( [phone] )  [facility] []   [phone]( [phone] )  [facility] []   [phone]( [phone] )  [facility]

## 2016-05-03 NOTE — Progress Notes (Signed)
Progress Note  Patient Name: Timothy Alvarez Date of Encounter: 05/03/2016  Primary Cardiologist: Dr. Jens Somrenshaw  Subjective   Still having dyspnea when having a conversation. Breathing not at baseline. No chest discomfort or palpitations.   Inpatient Medications    Scheduled Meds: . acidophilus  1 capsule Oral Daily  . aspirin EC  81 mg Oral Daily  . atropine  1 drop Right Eye BID  . brimonidine  1 drop Left Eye BID   And  . timolol  1 drop Left Eye BID  . carvedilol  3.125 mg Oral BID WC  . ciprofloxacin  1 drop Right Eye Q2H while awake  . [START ON 05/04/2016] ciprofloxacin  1 drop Right Eye Q4H while awake  . clopidogrel  75 mg Oral Daily  . enoxaparin (LOVENOX) injection  40 mg Subcutaneous Q24H  . famotidine  20 mg Oral Daily  . fludrocortisone  100 mcg Oral BID  . furosemide  40 mg Oral BID  . gabapentin  600 mg Oral TID  . insulin aspart  0-15 Units Subcutaneous TID WC  . insulin aspart  0-5 Units Subcutaneous QHS  . insulin aspart  5 Units Subcutaneous TID WC  . insulin glargine  30 Units Subcutaneous QHS  . isosorbide mononitrate  120 mg Oral BID  . latanoprost  1 drop Left Eye QHS  . levofloxacin  750 mg Oral Q24H  . losartan  12.5 mg Oral Daily  . midodrine  10 mg Oral BID  . potassium chloride SA  20 mEq Oral Daily  . prednisoLONE acetate  1 drop Right Eye BID  . ranolazine  500 mg Oral BID  . rosuvastatin  40 mg Oral q1800  . senna-docusate  1 tablet Oral Daily  . sodium chloride flush  3 mL Intravenous Q12H   Continuous Infusions:  PRN Meds: albuterol, benzonatate, HYDROcodone-acetaminophen, meclizine, nitroGLYCERIN, zolpidem   Vital Signs    Vitals:   05/02/16 1453 05/02/16 2101 05/03/16 0508 05/03/16 0752  BP:  129/73 (!) 147/68 (!) 149/69  Pulse:  78 82 88  Resp:  20  17  Temp:  98.2 F (36.8 C) 98 F (36.7 C) 98.7 F (37.1 C)  TempSrc:  Oral Oral Oral  SpO2: 93% 95% 94% 93%  Weight:   197 lb 4.8 oz (89.5 kg)   Height:         Intake/Output Summary (Last 24 hours) at 05/03/16 1105 Last data filed at 05/03/16 0500  Gross per 24 hour  Intake              370 ml  Output              950 ml  Net             -580 ml   Filed Weights   05/01/16 0448 05/02/16 0441 05/03/16 0508  Weight: 193 lb 9.6 oz (87.8 kg) 195 lb 4.8 oz (88.6 kg) 197 lb 4.8 oz (89.5 kg)    Telemetry    NSR, HR in 70's - 90's. No ectopic events.  - Personally Reviewed  ECG    No new tracings.   Physical Exam   General: Well developed, well nourished Caucasian male appearing in no acute distress. Head: Normocephalic, atraumatic.  Neck: Supple without bruits, JVD at 9cm. Lungs:  Resp regular and unlabored, decreased breath sounds along left base. Heart: RRR, S1, S2, no S3, S4, or murmur; no rub. Abdomen: Soft, non-tender, non-distended with normoactive bowel sounds. No  hepatomegaly. No rebound/guarding. No obvious abdominal masses. Extremities: No clubbing or cyanosis, trace lower extremity edema, most prominent on right. Distal pedal pulses are 2+ bilaterally. Neuro: Alert and oriented X 3. Moves all extremities spontaneously. Psych: Normal affect.  Labs    Chemistry  Recent Labs Lab 04/30/16 0406 05/02/16 0526 05/03/16 0357  NA 141 134* 137  K 3.6 4.5 4.0  CL 101 97* 100*  CO2 30 26 29   GLUCOSE 199* 434* 212*  BUN 17 22* 24*  CREATININE 1.28* 1.59* 1.61*  CALCIUM 8.4* 8.7* 8.9  GFRNONAA 58* 45* 44*  GFRAA >60 52* 51*  ANIONGAP 10 11 8      Hematology  Recent Labs Lab 04/29/16 1807 04/30/16 0406 05/03/16 0357  WBC 5.9 7.4 8.5  RBC 4.30 4.17* 4.04*  HGB 12.1* 11.5* 11.2*  HCT 37.4* 36.0* 34.9*  MCV 87.0 86.3 86.4  MCH 28.1 27.6 27.7  MCHC 32.4 31.9 32.1  RDW 14.7 14.7 14.7  PLT 194 197 222    Cardiac Enzymes  Recent Labs Lab 04/29/16 1807 04/29/16 2228 04/30/16 0406  TROPONINI 0.56* 0.51* 0.49*     Recent Labs Lab 04/29/16 1043  TROPIPOC 0.69*     BNP  Recent Labs Lab 04/29/16 1025  05/02/16 0526  BNP 303.6* 208.0*     DDimer No results for input(s): DDIMER in the last 168 hours.   Radiology    Dg Chest 2 View  Result Date: 05/02/2016 CLINICAL DATA:  Shortness of Breath EXAM: CHEST  2 VIEW COMPARISON:  April 29, 2016 FINDINGS: There remains interstitial edema with cardiomegaly and pulmonary venous hypertension. There is a a new focus of airspace consolidation in the medial left base. No airspace consolidation evident. No adenopathy. No pneumothorax. Bones osteoporotic. IMPRESSION: Evidence a degree of congestive heart failure, stable. New consolidation medial left base. Suspect focal pneumonia, although alveolar edema in this area could present in this manner. No other airspace consolidation. Electronically Signed   By: Bretta Bang III M.D.   On: 05/02/2016 07:08    Cardiac Studies   Echocardiogram: 05/01/2016 Study Conclusions  - Left ventricle: The cavity size was normal. Wall thickness was   increased in a pattern of mild LVH. Systolic function was   moderately to severely reduced. The estimated ejection fraction   was in the range of 30% to 35%. Features are consistent with a   pseudonormal left ventricular filling pattern, with concomitant   abnormal relaxation and increased filling pressure (grade 2   diastolic dysfunction). Doppler parameters are consistent with   high ventricular filling pressure. - Regional wall motion abnormality: Severe hypokinesis of the   apical anterior, apical septal, apical lateral, and apical   myocardium; moderate hypokinesis of the mid anterior, mid   anteroseptal, apical inferior, and mid inferolateral myocardium;   mild hypokinesis of the mid inferior myocardium. Basal anterior   and anterolateral hypokinesis. - Aortic valve: Moderately calcified annulus. Trileaflet. - Mitral valve: Severely calcified annulus. There was mild   regurgitation. - Left atrium: The atrium was moderately dilated. - Right ventricle:  Systolic function was mildly reduced. - Inferior vena cava: Estimated CVP 15 mmHg.  Patient Profile     61 y.o. male w/ PMH of multivessel CAD (severe diffuse CAD by cath in 03/2015, declined CABG in the past), IDDM, chronic diastolic CHF (EF 16-10% by echo in 03/2015), HTN, and HLD admitted on 04/29/2016 for worsening dyspnea. Weight up 14 lbs from baseline despite increasing PO Lasix intake. Echo this admission shows  reduced EF of 30-35% with WMA.   Assessment & Plan    1. Acute on Chronic Combined Systolic and Diastolic CHF - EF was 50-55% by echo in 03/2015. Repeat echo this admission shows EF of 30% to 35%, Grade 2 DD, and severe hypokinesis of the apical anterior, apical septal, apical lateral, and apical myocardium. - BNP 303 on admission, rechecked on 2/19 and at 208. Weight was down 4 lbs (198 --> 195 lbs), but now trending back up to 197 lbs today since being switched to PO Lasix. Was 189 lbs at the time of last office visit on 04/14/2016. Recorded output -5.3L this admission. Will give Metolazone dosing today, 30 minutes prior to evening Lasix dose. If minimal output with this, consider transition to Torsemide.  - continue ARB. Was not on BB therapy prior to admission secondary to history of bradycardia. Patient cannot remember what BB therapy/dosing he was on in the past. Started on low-dose Coreg 3.125mg  BID with HR in 70's - 90's on telemetry.  2. CAD/ Elevated Troponin - severe diffuse CAD by cath in 03/2015, PCI was thought to be high-risk at the time of his last cath and not felt to be a good CABG candidate secondary to poor targets. Cath not pursued in 03/2016 due to the patient declining further workup and it would not likely change the plan.  - troponin values flat at 0.51 and 0.49, similar to values during 03/2016 admission. No plans for further ischemic evaluation at this time.   3. Essential HTN - BP at 119/66 - 149/73 in the past 24 hours. - continue Losartan and Imdur.    4. Stage 3 CKD - baseline creatinine 1.3 - 1.4. - trending up to 1.61 this AM.   5. Type 2 DM - per admitting team  6. Abnormal CXR - CXR on 2/19 shows degree of CHF with consolidation along medial left base, suspicious of focal PNA. - started on Levaquin by admitting team.   Signed, Ellsworth Lennox , PA-C 11:05 AM 05/03/2016 Pager: [phone]  I have seen and examined the patient along with Ellsworth Lennox , PA-C.  I have reviewed the chart, notes and new data.  I agree with PA's note.  Key new complaints: no real improvement in dyspnea Key examination changes: no rales, JVD and mild peripheral edema persist   PLAN: Just received metolazone about 30 min ago. No increase in diuresis seen so far. Will follow along  Thurmon Fair, MD, [facility] [phone] 05/03/2016, 1:45 PM

## 2016-05-03 NOTE — Progress Notes (Signed)
Paged MD regarding pt wife brought pt insulin pump. MD stated she needed to talk to pharmacology regarding inulin orders and that she would call the pt wife back directly. MD stated at this time continue with pt sliding scale insulin 1630  Timothy Alvarez

## 2016-05-03 NOTE — Progress Notes (Signed)
Inpatient Diabetes Program Recommendations  AACE/ADA: New Consensus Statement on Inpatient Glycemic Control (2015)  Target Ranges:  Prepandial:   less than 140 mg/dL      Peak postprandial:   less than 180 mg/dL (1-2 hours)      Critically ill patients:  140 - 180 mg/dL   Results for Timothy Alvarez, Salik F (MRN [id number]) as of 05/03/2016 14:28  Ref. Range 05/03/2016 07:42 05/03/2016 11:58  Glucose-Capillary Latest Ref Range: 65 - 99 mg/dL 829262 (H) 562389 (H)   Review of Glycemic Control  Diabetes history: DM 1 Outpatient Diabetes medications: (no longer has Omnipod pump and supplies due to switching insurance providers) Humalog SSI 0-15 units, Lantus 30 Daily Current orders for Inpatient glycemic control: Lantus 30 QHS, Novolog Moderate + HS scale + Novolog 5 units TID meal coverage  Inpatient Diabetes Program Recommendations:   Consider increasing Lantus to 35 units. Increase meal coverage to Novolog 7 units TID if 50% of meals are consumed.  Thanks,  Christena DeemShannon Delanie Tirrell RN, MSN, [facility] Inpatient Diabetes Coordinator Team Pager [phone] (8a-5p)

## 2016-05-03 NOTE — Progress Notes (Signed)
Pt was able to urinate after Foley out, Bladder scan shows 350 before and 0 cc post void amount.he slept on and off, no any complain of chest pain, SOB and distress overnight, will continue to monitor the patient.

## 2016-05-04 ENCOUNTER — Telehealth: Payer: Self-pay | Admitting: *Deleted

## 2016-05-04 DIAGNOSIS — Z9861 Coronary angioplasty status: Secondary | ICD-10-CM | POA: Diagnosis not present

## 2016-05-04 DIAGNOSIS — I2510 Atherosclerotic heart disease of native coronary artery without angina pectoris: Secondary | ICD-10-CM | POA: Diagnosis not present

## 2016-05-04 DIAGNOSIS — I5043 Acute on chronic combined systolic (congestive) and diastolic (congestive) heart failure: Secondary | ICD-10-CM | POA: Diagnosis not present

## 2016-05-04 DIAGNOSIS — I10 Essential (primary) hypertension: Secondary | ICD-10-CM | POA: Diagnosis not present

## 2016-05-04 DIAGNOSIS — I951 Orthostatic hypotension: Secondary | ICD-10-CM | POA: Diagnosis not present

## 2016-05-04 DIAGNOSIS — R0602 Shortness of breath: Secondary | ICD-10-CM | POA: Diagnosis not present

## 2016-05-04 DIAGNOSIS — I5023 Acute on chronic systolic (congestive) heart failure: Secondary | ICD-10-CM | POA: Diagnosis not present

## 2016-05-04 LAB — MAGNESIUM: Magnesium: 2.2 mg/dL (ref 1.7–2.4)

## 2016-05-04 LAB — CBC
HCT: 35.2 % — ABNORMAL LOW (ref 39.0–52.0)
HEMOGLOBIN: 11.6 g/dL — AB (ref 13.0–17.0)
MCH: 28.3 pg (ref 26.0–34.0)
MCHC: 33 g/dL (ref 30.0–36.0)
MCV: 85.9 fL (ref 78.0–100.0)
PLATELETS: 255 10*3/uL (ref 150–400)
RBC: 4.1 MIL/uL — AB (ref 4.22–5.81)
RDW: 14.8 % (ref 11.5–15.5)
WBC: 6.9 10*3/uL (ref 4.0–10.5)

## 2016-05-04 LAB — GLUCOSE, CAPILLARY
GLUCOSE-CAPILLARY: 265 mg/dL — AB (ref 65–99)
Glucose-Capillary: 149 mg/dL — ABNORMAL HIGH (ref 65–99)
Glucose-Capillary: 53 mg/dL — ABNORMAL LOW (ref 65–99)
Glucose-Capillary: 153 mg/dL — ABNORMAL HIGH (ref 65–99)
Glucose-Capillary: 88 mg/dL (ref 65–99)

## 2016-05-04 LAB — BASIC METABOLIC PANEL
Anion gap: 12 (ref 5–15)
Anion gap: 9 (ref 5–15)
BUN: 23 mg/dL — AB (ref 6–20)
BUN: 24 mg/dL — ABNORMAL HIGH (ref 6–20)
CALCIUM: 9 mg/dL (ref 8.9–10.3)
CHLORIDE: 98 mmol/L — AB (ref 101–111)
CO2: 29 mmol/L (ref 22–32)
CO2: 30 mmol/L (ref 22–32)
CREATININE: 1.56 mg/dL — AB (ref 0.61–1.24)
Calcium: 9 mg/dL (ref 8.9–10.3)
Chloride: 99 mmol/L — ABNORMAL LOW (ref 101–111)
Creatinine, Ser: 1.77 mg/dL — ABNORMAL HIGH (ref 0.61–1.24)
GFR, EST AFRICAN AMERICAN: 53 mL/min — AB (ref >60)
GFR, EST AFRICAN AMERICAN: 46 mL/min — AB (ref >60)
GFR, EST NON AFRICAN AMERICAN: 46 mL/min — AB (ref >60)
GFR, EST NON AFRICAN AMERICAN: 39 mL/min — AB (ref >60)
Glucose, Bld: 63 mg/dL — ABNORMAL LOW (ref 65–99)
Glucose, Bld: 150 mg/dL — ABNORMAL HIGH (ref 65–99)
POTASSIUM: 3.1 mmol/L — AB (ref 3.5–5.1)
Potassium: 3.9 mmol/L (ref 3.5–5.1)
Sodium: 139 mmol/L (ref 135–145)
Sodium: 138 mmol/L (ref 135–145)

## 2016-05-04 MED ORDER — POTASSIUM CHLORIDE CRYS ER 20 MEQ PO TBCR
20.0000 meq | EXTENDED_RELEASE_TABLET | Freq: Once | ORAL | Status: AC
  Administered 2016-05-04: 20 meq via ORAL
  Filled 2016-05-04: qty 1

## 2016-05-04 MED ORDER — MIDODRINE HCL 5 MG PO TABS
10.0000 mg | ORAL_TABLET | Freq: Three times a day (TID) | ORAL | Status: DC
  Administered 2016-05-04 – 2016-05-05 (×5): 10 mg via ORAL
  Filled 2016-05-04 (×5): qty 2

## 2016-05-04 MED ORDER — RANOLAZINE ER 500 MG PO TB12
1000.0000 mg | ORAL_TABLET | Freq: Two times a day (BID) | ORAL | Status: DC
  Administered 2016-05-04 – 2016-05-05 (×2): 1000 mg via ORAL
  Filled 2016-05-04 (×2): qty 2

## 2016-05-04 MED ORDER — ISOSORBIDE MONONITRATE ER 60 MG PO TB24
60.0000 mg | ORAL_TABLET | Freq: Two times a day (BID) | ORAL | Status: DC
  Administered 2016-05-04 – 2016-05-05 (×2): 60 mg via ORAL
  Filled 2016-05-04 (×2): qty 1

## 2016-05-04 MED ORDER — POTASSIUM CHLORIDE CRYS ER 20 MEQ PO TBCR
40.0000 meq | EXTENDED_RELEASE_TABLET | Freq: Once | ORAL | Status: AC
  Administered 2016-05-04: 40 meq via ORAL
  Filled 2016-05-04: qty 2

## 2016-05-04 MED ORDER — LEVOFLOXACIN 750 MG PO TABS: 750.0000 mg | ORAL_TABLET | ORAL | Status: DC

## 2016-05-04 MED ORDER — FUROSEMIDE 40 MG PO TABS
40.0000 mg | ORAL_TABLET | Freq: Two times a day (BID) | ORAL | Status: DC
  Administered 2016-05-04 – 2016-05-05 (×4): 40 mg via ORAL
  Filled 2016-05-04 (×4): qty 1

## 2016-05-04 MED ORDER — METOLAZONE 2.5 MG PO TABS
2.5000 mg | ORAL_TABLET | Freq: Once | ORAL | Status: AC
Start: 2016-05-04 — End: 2016-05-04
  Administered 2016-05-04: 2.5 mg via ORAL
  Filled 2016-05-04: qty 1

## 2016-05-04 NOTE — Progress Notes (Signed)
Paged cardiology per pt request. Angie PA stated they changed his medications and midodrine is to be taken with all meals and zaroxolyn will be only in hospital to diurese pt more. Pt understands. PA aware pt schedule for lasix again at 1800, PA stated that is fine no need to re schedule   Osman Calzadilla

## 2016-05-04 NOTE — Progress Notes (Signed)
Patient ID: Timothy Alvarez, male   DOB: February 01, 1956, 61 y.o.   MRN: [id number]    PROGRESS NOTE  Timothy Alvarez  WUJ:[id number] DOB: 1955-07-27 DOA: 04/29/2016  PCP: Esperanza Richters, PA-C   Brief Narrative:  61 y.o. male with medical history significant of multivessel CAD (severe diffuse CAD by cath in 03/2015, declined CABG in the past), IDDM, chronic diastolic CHF (EF 95-62% by echo in 03/2015), HTN, and HLD presented to [facility] with main concern of several days duration of progressively worsening dyspnea, associated with 14 lbs weight gain. -Improving with diuresis  Assessment & Plan:     Acute systolic and diastolic CHF  - was on IV lasix, changed to PO and given metolazone yesterday,  - diuresing well now, negative 6.1L - ECHO with EF down to 30-35% with grade II diastolic CHF, severe hypokinesis of the apical anterior, apical septal, apical lateral, and apical myocardium. - Was not on BB therapy prior to admission secondary to history of bradycardia, Coreg added, continue ARB - per Cards  CAD -severe diffuse CAD by Cath in 1/17 -not felt to be a good CABG candidate secondary to poor targets. -Troponin flat trend this time -continue ASA/plavix/coreg/statin/imdur   H/o Orthostatic hypotension -on midodrine/fluodrocortisone -florinef being weaned    ? LLL PNA -Levaquin added by Dr.Myers 2/20, Rx for 5days only    Acute kidney injury - related to diurteics, ARB,  - mild, monitor    Hyponatremia - resolved     DM type II - started Lantus 30 U QHS - added SSI moderate coverage  - A1C 7.5 (05/01/2016)    HTN, essential  - continue home medical regimen  - reasonable inpatient control, SBP in 140's this AM     Obesity  - Body mass index is 31.34 kg/m.  DVT prophylaxis: Lovenox SQ Code Status: No CPR and no Intubation, otherwise ok to initiate rapid response if needed  Family Communication: Patient at bedside  Disposition Plan: Home when cardiac symptoms  better and adequately diuresed  Consultants:   Cardiology   Procedures:   ECHO 2/17 --> EF 30 - 35%  Antimicrobials:   Levaquin 2/19 -->  Subjective: Still with swelling,. Breathing improving finally  Objective: Vitals:   05/04/16 1000 05/04/16 1002 05/04/16 1005 05/04/16 1230  BP: 114/60 (!) 94/51 (!) 101/50 121/64  Pulse: 77 75 75 71  Resp:    20  Temp:    98.5 F (36.9 C)  TempSrc:    Oral  SpO2:    100%  Weight:      Height:        Intake/Output Summary (Last 24 hours) at 05/04/16 1316 Last data filed at 05/04/16 1231  Gross per 24 hour  Intake              960 ml  Output             2200 ml  Net            -1240 ml   Filed Weights   05/01/16 0448 05/02/16 0441 05/03/16 0508  Weight: 87.8 kg (193 lb 9.6 oz) 88.6 kg (195 lb 4.8 oz) 89.5 kg (197 lb 4.8 oz)   Examination:  General exam: AAOx3  Respiratory system: Mild rhonchi at bases  Cardiovascular system: S1 & S2 heard, RRR. No JVD, rubs, gallops or clicks.  Gastrointestinal system: Abdomen is nondistended, soft and nontender. Normal bowel sounds heard. Central nervous system: Alert and oriented. No focal neurological  deficits. Ext: 1 plus edema Psychiatry: Judgement and insight appear normal. Mood & affect appropriate.   Data Reviewed: I have personally reviewed following labs and imaging studies  CBC:  Recent Labs Lab 04/29/16 1025 04/29/16 1807 04/30/16 0406 05/03/16 0357 05/04/16 0507  WBC 5.3 5.9 7.4 8.5 6.9  NEUTROABS 3.2  --   --   --   --   HGB 11.7* 12.1* 11.5* 11.2* 11.6*  HCT 35.9* 37.4* 36.0* 34.9* 35.2*  MCV 87.3 87.0 86.3 86.4 85.9  PLT 193 194 197 222 255   Basic Metabolic Panel:  Recent Labs Lab 04/29/16 1025 04/29/16 1807 04/30/16 0406 05/02/16 0526 05/03/16 0357 05/04/16 0507 05/04/16 1100  NA 141  --  141 134* 137 139  --   K 3.6  --  3.6 4.5 4.0 3.1*  --   CL 104  --  101 97* 100* 98*  --   CO2 29  --  30 26 29 29   --   GLUCOSE 96  --  199* 434* 212* 63*  --    BUN 19  --  17 22* 24* 23*  --   CREATININE 1.15 1.25* 1.28* 1.59* 1.61* 1.56*  --   CALCIUM 8.4*  --  8.4* 8.7* 8.9 9.0  --   MG  --   --   --   --   --   --  2.2   Cardiac Enzymes:  Recent Labs Lab 04/29/16 1807 04/29/16 2228 04/30/16 0406  TROPONINI 0.56* 0.51* 0.49*   BNP (last 3 results)  Recent Labs  02/10/16 1030 02/17/16 1245  PROBNP 477.0* 695.0*   CBG:  Recent Labs Lab 05/03/16 1644 05/03/16 2139 05/04/16 0734 05/04/16 0814 05/04/16 1123  GLUCAP 135* 201* 53* 149* 265*   Urine analysis:    Component Value Date/Time   COLORURINE YELLOW 04/29/2016 1450   APPEARANCEUR CLEAR 04/29/2016 1450   LABSPEC 1.009 04/29/2016 1450   PHURINE 7.5 04/29/2016 1450   GLUCOSEU NEGATIVE 04/29/2016 1450   GLUCOSEU 500 (A) 04/07/2015 1418   HGBUR NEGATIVE 04/29/2016 1450   BILIRUBINUR NEGATIVE 04/29/2016 1450   BILIRUBINUR neg 04/22/2015 1130   KETONESUR NEGATIVE 04/29/2016 1450   PROTEINUR NEGATIVE 04/29/2016 1450   UROBILINOGEN 0.2 04/22/2015 1130   UROBILINOGEN 0.2 04/07/2015 1418   NITRITE NEGATIVE 04/29/2016 1450   LEUKOCYTESUR NEGATIVE 04/29/2016 1450     Radiology Studies: No results found.  Scheduled Meds: . acidophilus  1 capsule Oral Daily  . aspirin EC  81 mg Oral Daily  . atropine  1 drop Right Eye BID  . brimonidine  1 drop Left Eye BID   And  . timolol  1 drop Left Eye BID  . carvedilol  3.125 mg Oral BID WC  . ciprofloxacin  1 drop Right Eye Q2H while awake  . ciprofloxacin  1 drop Right Eye Q4H while awake  . clopidogrel  75 mg Oral Daily  . enoxaparin (LOVENOX) injection  40 mg Subcutaneous Q24H  . famotidine  20 mg Oral Daily  . furosemide  40 mg Oral BID  . gabapentin  600 mg Oral TID  . insulin aspart  0-15 Units Subcutaneous TID WC  . insulin aspart  0-5 Units Subcutaneous QHS  . insulin aspart  5 Units Subcutaneous TID WC  . insulin glargine  30 Units Subcutaneous QHS  . isosorbide mononitrate  60 mg Oral BID  . latanoprost  1  drop Left Eye QHS  . levofloxacin  750 mg Oral Q24H  .  losartan  12.5 mg Oral Daily  . metolazone  2.5 mg Oral Once  . midodrine  10 mg Oral TID AC  . potassium chloride SA  20 mEq Oral Daily  . prednisoLONE acetate  1 drop Right Eye BID  . ranolazine  1,000 mg Oral BID  . rosuvastatin  40 mg Oral q1800  . senna-docusate  1 tablet Oral Daily  . sodium chloride flush  3 mL Intravenous Q12H   Continuous Infusions:   LOS: 5 days   Time spent: 20 minutes   Zannie CoveJOSEPH,Boluwatife Flight, MD [facility] Pager [phone]  If 7PM-7AM, please contact night-coverage [email or web address] Password [email or web address] 05/04/2016, 1:16 PM

## 2016-05-04 NOTE — Progress Notes (Signed)
MD aware of potassium level and that it was replaced with a total of PO potassium today. No new orders  Timothy Alvarez

## 2016-05-04 NOTE — Progress Notes (Signed)
Orthostatics completed page MD as requested, awaiting call back still  Timothy Alvarez

## 2016-05-04 NOTE — Progress Notes (Signed)
Notified K.Schorr re K=3.1

## 2016-05-04 NOTE — Care Management Important Message (Signed)
Important Message  Patient Details  Name: Timothy Alvarez MRN: [id number] Date of Birth: 02-12-1956   Medicare Important Message Given:  Yes    Tashena Ibach 05/04/2016, 11:13 AM

## 2016-05-04 NOTE — Progress Notes (Signed)
Progress Note  Patient Name: Timothy Alvarez Date of Encounter: 05/04/2016  Primary Cardiologist: Dr. Jens Som  Subjective   Pt reports new chest pain this morning while standing/walking around the room. He described it as chest tightness from left to right chest that resolved after 1-2 min of resting. No dizziness or lightheadedness.  Inpatient Medications    Scheduled Meds: . acidophilus  1 capsule Oral Daily  . aspirin EC  81 mg Oral Daily  . atropine  1 drop Right Eye BID  . brimonidine  1 drop Left Eye BID   And  . timolol  1 drop Left Eye BID  . carvedilol  3.125 mg Oral BID WC  . ciprofloxacin  1 drop Right Eye Q2H while awake  . ciprofloxacin  1 drop Right Eye Q4H while awake  . clopidogrel  75 mg Oral Daily  . enoxaparin (LOVENOX) injection  40 mg Subcutaneous Q24H  . famotidine  20 mg Oral Daily  . fludrocortisone  100 mcg Oral BID  . gabapentin  600 mg Oral TID  . insulin aspart  0-15 Units Subcutaneous TID WC  . insulin aspart  0-5 Units Subcutaneous QHS  . insulin aspart  5 Units Subcutaneous TID WC  . insulin glargine  30 Units Subcutaneous QHS  . isosorbide mononitrate  120 mg Oral BID  . latanoprost  1 drop Left Eye QHS  . levofloxacin  750 mg Oral Q24H  . losartan  12.5 mg Oral Daily  . midodrine  10 mg Oral BID  . potassium chloride SA  20 mEq Oral Daily  . prednisoLONE acetate  1 drop Right Eye BID  . ranolazine  500 mg Oral BID  . rosuvastatin  40 mg Oral q1800  . senna-docusate  1 tablet Oral Daily  . sodium chloride flush  3 mL Intravenous Q12H   Continuous Infusions:  PRN Meds: albuterol, benzonatate, HYDROcodone-acetaminophen, meclizine, nitroGLYCERIN, zolpidem   Vital Signs    Vitals:   05/03/16 0752 05/03/16 1833 05/03/16 1844 05/03/16 2056  BP: (!) 149/69 (!) 90/53 (!) 104/48 130/74  Pulse: 88 77 80 71  Resp: 17  18 18   Temp: 98.7 F (37.1 C)   98.4 F (36.9 C)  TempSrc: Oral   Oral  SpO2: 93% 95% 96% 95%  Weight:      Height:         Intake/Output Summary (Last 24 hours) at 05/04/16 0934 Last data filed at 05/04/16 1610  Gross per 24 hour  Intake             1182 ml  Output             2400 ml  Net            -1218 ml   Filed Weights   05/01/16 0448 05/02/16 0441 05/03/16 0508  Weight: 193 lb 9.6 oz (87.8 kg) 195 lb 4.8 oz (88.6 kg) 197 lb 4.8 oz (89.5 kg)     Physical Exam   General: Well developed, well nourished, male appearing in no acute distress. Head: Normocephalic, atraumatic.  Neck: Supple without bruits, moderate JVD Lungs:  Resp regular and unlabored, CTA, diminished in bases Heart: RRR, S1, S2, no murmur; no rub. Abdomen: Soft, non-tender, non-distended with normoactive bowel sounds. No hepatomegaly. No rebound/guarding. No obvious abdominal masses. Extremities: No clubbing, cyanosis, Trace B LE edema. Distal pedal pulses are 2+ bilaterally. Neuro: Alert and oriented X 3. Moves all extremities spontaneously. Psych: Normal affect.  Labs  Chemistry Recent Labs Lab 05/02/16 0526 05/03/16 0357 05/04/16 0507  NA 134* 137 139  K 4.5 4.0 3.1*  CL 97* 100* 98*  CO2 26 29 29   GLUCOSE 434* 212* 63*  BUN 22* 24* 23*  CREATININE 1.59* 1.61* 1.56*  CALCIUM 8.7* 8.9 9.0  GFRNONAA 45* 44* 46*  GFRAA 52* 51* 53*  ANIONGAP 11 8 12      Hematology Recent Labs Lab 04/30/16 0406 05/03/16 0357 05/04/16 0507  WBC 7.4 8.5 6.9  RBC 4.17* 4.04* 4.10*  HGB 11.5* 11.2* 11.6*  HCT 36.0* 34.9* 35.2*  MCV 86.3 86.4 85.9  MCH 27.6 27.7 28.3  MCHC 31.9 32.1 33.0  RDW 14.7 14.7 14.8  PLT 197 222 255    Cardiac Enzymes Recent Labs Lab 04/29/16 1807 04/29/16 2228 04/30/16 0406  TROPONINI 0.56* 0.51* 0.49*    Recent Labs Lab 04/29/16 1043  TROPIPOC 0.69*     BNP Recent Labs Lab 04/29/16 1025 05/02/16 0526  BNP 303.6* 208.0*     DDimer No results for input(s): DDIMER in the last 168 hours.   Radiology    No results found.   Telemetry    NSR in the 80s - Personally  Reviewed  ECG    No new tracings  Cardiac Studies   Echocardiogram: 05/01/2016 Study Conclusions - Left ventricle: The cavity size was normal. Wall thickness was increased in a pattern of mild LVH. Systolic function was moderately to severely reduced. The estimated ejection fraction was in the range of 30% to 35%. Features are consistent with a pseudonormal left ventricular filling pattern, with concomitant abnormal relaxation and increased filling pressure (grade 2 diastolic dysfunction). Doppler parameters are consistent with high ventricular filling pressure. - Regional wall motion abnormality: Severe hypokinesis of the apical anterior, apical septal, apical lateral, and apical myocardium; moderate hypokinesis of the mid anterior, mid anteroseptal, apical inferior, and mid inferolateral myocardium; mild hypokinesis of the mid inferior myocardium. Basal anterior and anterolateral hypokinesis. - Aortic valve: Moderately calcified annulus. Trileaflet. - Mitral valve: Severely calcified annulus. There was mild regurgitation. - Left atrium: The atrium was moderately dilated. - Right ventricle: Systolic function was mildly reduced. - Inferior vena cava: Estimated CVP 15 mmHg.  Patient Profile     61 y.o. male w/ PMH of multivessel CAD (severe diffuse CAD by cath in 03/2015, declined CABG in the past), IDDM, chronic diastolic CHF (EF 16-10%50-55% by echo in 03/2015), HTN, and HLD admitted on 04/29/2016 for worsening dyspnea. Weight up 14 lbs from baseline despite increasing PO Lasix intake. Echo this admission shows reduced EF of 30-35% with WMA.   Assessment & Plan    1. Acute on Chronic Combined Systolic and Diastolic CHF - EF was 50-55% by echo in 03/2015. Repeat echo this admission shows EF of 30% to 35%, Grade 2 DD, and severe hypokinesis of the apical anterior, apical septal, apical lateral, and apical myocardium. - BNP 303 on admission, rechecked on 2/19  and at 208.  - pt diuresing well with PO lasix and zaroxolyn x 1 yesterday: 2L out yesterday with net negative 6.1L; weight not yet collected today; dry weight 189 lbs (at last office visit 04/14/16) - continue ARB. Was not on BB therapy prior to admission secondary to history of bradycardia. Patient cannot remember what BB therapy/dosing he was on in the past. Started on low-dose Coreg 3.125mg  BID yesterday; HR remains in the 70-80s; BP: 90/53 - 149/69. He seems to be tolerating this new coreg well - continue diuresis with  PO lasix and zaroxolyn vs torsemide, replaced K for hypokalemia; continue to check daily BMP, including magnesium   2. CAD / Elevated Troponin - severe diffuse CAD by cath in 03/2015, PCI was thought to be high-risk at the time of his last cath and not felt to be a good CABG candidate secondary to poor targets. Cath not pursued in 03/2016 due to the patient declining further workup and it would not likely change the plan.  - troponin values flat at 0.51 and 0.49, similar to values during 03/2016 admission. No plans for further ischemic evaluation at this time.  - patient reported new chest tightness today while performing self-care at the sink; he walked from the sink to the closet and then to the bed and experienced chest tightness from left to right that did not radiate and resolved after 1-2 min of rest in bed. He reports never feeling this type of pain before. - repeat EKG pending   3. Essential HTN - BP: 90/53 - 149/69 (last 24 hrs) - medications today: 3.125 mg coreg BID; 12.5 mg losartan daily; 120 mg imdur daily - pt is also on 10 mg midodrine BID; 100 mcg florinef BID - MD to advise; may consider weaning off florinef in the presence of combined CHF; patient states he does not know why he is taking florinef or how long he has been taking it  - collect new set of orthostatics   4. Stage 3 CKD - baseline creatinine 1.3 - 1.4. - trending down today: 1.56 (1.61); good  urine output   5. Type 2 DM - per admitting team    6. Abnormal CXR - CXR on 2/19 shows degree of CHF with consolidation along medial left base, suspicious of focal PNA. - started on Levaquin by admitting team.     Signed, Marcelino Duster , PA-C 9:34 AM 05/04/2016 Pager: [phone]   I have seen and examined the patient along with Marcelino Duster , PA-C.  I have reviewed the chart, notes and new data.  I agree with PA's note.  Key new complaints: the chest discomfort he described sounds like angina (except he has not experienced angina before "due to neuropathy from diabetes"). The episode occurred with activity and quickly resolved with rest Key examination changes: still edematous Key new findings / data: he is on fludrocortisone  PLAN: Stop fludrocortisone, at least until we achieve euvolemia. If he has more angina, would increase Ranexa to full 1000 mg BID dose.  Timothy Fair, MD, [facility] [phone] 05/04/2016, 11:45 AM

## 2016-05-04 NOTE — Telephone Encounter (Signed)
[facility] 8[facility]31am @ [phone]) asking the pt to RTC regarding Statement of Medical Necessity from [facility].  Per Ramon DredgeEdward pt will need an appointment for a Face to Face to complete form.//AB/CMA

## 2016-05-04 NOTE — Progress Notes (Signed)
Physical Therapy Treatment Patient Details Name: Timothy Alvarez MRN: [id number] DOB: 21-Jul-1955 Today's Date: 05/04/2016    History of Present Illness Pt is a 61 yo male admitted to hospital w/ progressiviely worsening dyspnea, found to have fluid overload and acute on chronic congestive heart failure. PMH includes; CAD, cataracts, CHF, type 1 diabetes uncontrolled, heart attack x5, and hyperlipidemia.     PT Comments    Pt presented supine in bed with HOB elevated, awake and willing to participate in therapy session. Pt tolerated bilateral LE therapeutic exercises (see below) in supine, sitting and standing with supervision and VC's. Pt would continue to benefit from skilled physical therapy services at this time while admitted and after d/c to address his limitations in order to improve his overall safety and independence with functional mobility.     Follow Up Recommendations  Home health PT     Equipment Recommendations  None recommended by PT    Recommendations for Other Services       Precautions / Restrictions Precautions Precautions: Fall Restrictions Weight Bearing Restrictions: Yes RLE Weight Bearing: Non weight bearing    Mobility  Bed Mobility Overal bed mobility: Modified Independent Bed Mobility: Supine to Sit;Sit to Supine     Supine to sit: Modified independent (Device/Increase time) Sit to supine: Modified independent (Device/Increase time)      Transfers Overall transfer level: Needs assistance Equipment used: Rolling walker (2 wheeled) Transfers: Sit to/from Stand Sit to Stand: Supervision         General transfer comment: increased time, supervision for safety  Ambulation/Gait                 Stairs            Wheelchair Mobility    Modified Rankin (Stroke Patients Only)       Balance Overall balance assessment: Needs assistance Sitting-balance support: Feet supported Sitting balance-Leahy Scale: Good     Standing  balance support: During functional activity Standing balance-Leahy Scale: Fair                      Cognition Arousal/Alertness: Awake/alert Behavior During Therapy: WFL for tasks assessed/performed Overall Cognitive Status: Within Functional Limits for tasks assessed                      Exercises General Exercises - Lower Extremity Long Arc Quad: AROM;Strengthening;Both;10 reps;Seated Heel Slides: AROM;Strengthening;Both;10 reps;Supine Straight Leg Raises: AROM;Strengthening;Both;10 reps;Supine Hip Flexion/Marching: AROM;Strengthening;Both;10 reps;Seated Mini-Sqauts: AROM;Strengthening;Both;10 reps;Standing    General Comments        Pertinent Vitals/Pain Pain Assessment: No/denies pain    Home Living                      Prior Function            PT Goals (current goals can now be found in the care plan section) Acute Rehab PT Goals Patient Stated Goal: return home and improve breathing PT Goal Formulation: With patient Time For Goal Achievement: 05/16/16 Potential to Achieve Goals: Good Progress towards PT goals: Progressing toward goals    Frequency    Min 3X/week      PT Plan Current plan remains appropriate    Co-evaluation             End of Session   Activity Tolerance: Patient tolerated treatment well Patient left: in bed;with call bell/phone within reach Nurse Communication: Mobility status PT Visit Diagnosis: Other abnormalities of  gait and mobility (R26.89)     Time: [id number] PT Time Calculation (min) (ACUTE ONLY): 19 min  Charges:  $Therapeutic Exercise: 8-22 mins                    G Codes:       Alessandra BevelsJennifer M Wyndell Cardiff 05/04/2016, 3:52 PM Deborah ChalkJennifer Edelyn Heidel, PT, DPT [phone]

## 2016-05-04 NOTE — Progress Notes (Signed)
Paged MD regarding pt blood draw results, pt potassium 3.9. Awaiting call back  Kylin Dubs Elige RadonBradley

## 2016-05-04 NOTE — Progress Notes (Signed)
MD at bedside requesting orthostatic vital signs for further evaluation of pt medication  Timothy Alvarez

## 2016-05-04 NOTE — Progress Notes (Signed)
Pt resting comfortably in bed states he is not having chest pain at this time. EKG completed and placed in patient chart. MD aware of pt EKG and orthostatic vital signs.  Timothy Alvarez

## 2016-05-05 ENCOUNTER — Other Ambulatory Visit: Payer: Self-pay | Admitting: Physician Assistant

## 2016-05-05 DIAGNOSIS — I10 Essential (primary) hypertension: Secondary | ICD-10-CM | POA: Diagnosis not present

## 2016-05-05 DIAGNOSIS — R7989 Other specified abnormal findings of blood chemistry: Secondary | ICD-10-CM

## 2016-05-05 DIAGNOSIS — Z9861 Coronary angioplasty status: Secondary | ICD-10-CM

## 2016-05-05 DIAGNOSIS — I951 Orthostatic hypotension: Secondary | ICD-10-CM

## 2016-05-05 DIAGNOSIS — I2510 Atherosclerotic heart disease of native coronary artery without angina pectoris: Secondary | ICD-10-CM

## 2016-05-05 DIAGNOSIS — S92301A Fracture of unspecified metatarsal bone(s), right foot, initial encounter for closed fracture: Secondary | ICD-10-CM | POA: Diagnosis not present

## 2016-05-05 DIAGNOSIS — I5043 Acute on chronic combined systolic (congestive) and diastolic (congestive) heart failure: Secondary | ICD-10-CM | POA: Diagnosis not present

## 2016-05-05 DIAGNOSIS — N179 Acute kidney failure, unspecified: Secondary | ICD-10-CM

## 2016-05-05 DIAGNOSIS — R0602 Shortness of breath: Secondary | ICD-10-CM | POA: Diagnosis not present

## 2016-05-05 DIAGNOSIS — R778 Other specified abnormalities of plasma proteins: Secondary | ICD-10-CM

## 2016-05-05 DIAGNOSIS — I5023 Acute on chronic systolic (congestive) heart failure: Secondary | ICD-10-CM | POA: Diagnosis not present

## 2016-05-05 DIAGNOSIS — E876 Hypokalemia: Secondary | ICD-10-CM

## 2016-05-05 DIAGNOSIS — M14671 Charcot's joint, right ankle and foot: Secondary | ICD-10-CM | POA: Diagnosis not present

## 2016-05-05 DIAGNOSIS — N189 Chronic kidney disease, unspecified: Principal | ICD-10-CM

## 2016-05-05 DIAGNOSIS — S93324D Dislocation of tarsometatarsal joint of right foot, subsequent encounter: Secondary | ICD-10-CM | POA: Diagnosis not present

## 2016-05-05 DIAGNOSIS — I214 Non-ST elevation (NSTEMI) myocardial infarction: Secondary | ICD-10-CM | POA: Diagnosis not present

## 2016-05-05 LAB — GLUCOSE, CAPILLARY
GLUCOSE-CAPILLARY: 101 mg/dL — AB (ref 65–99)
Glucose-Capillary: 58 mg/dL — ABNORMAL LOW (ref 65–99)
Glucose-Capillary: 202 mg/dL — ABNORMAL HIGH (ref 65–99)
Glucose-Capillary: 346 mg/dL — ABNORMAL HIGH (ref 65–99)

## 2016-05-05 LAB — BASIC METABOLIC PANEL
Anion gap: 13 (ref 5–15)
BUN: 26 mg/dL — AB (ref 6–20)
CALCIUM: 8.7 mg/dL — AB (ref 8.9–10.3)
CO2: 30 mmol/L (ref 22–32)
CREATININE: 1.85 mg/dL — AB (ref 0.61–1.24)
Chloride: 96 mmol/L — ABNORMAL LOW (ref 101–111)
GFR calc Af Amer: 43 mL/min — ABNORMAL LOW (ref >60)
GFR, EST NON AFRICAN AMERICAN: 37 mL/min — AB (ref >60)
GLUCOSE: 81 mg/dL (ref 65–99)
Potassium: 3.2 mmol/L — ABNORMAL LOW (ref 3.5–5.1)
SODIUM: 139 mmol/L (ref 135–145)

## 2016-05-05 LAB — MAGNESIUM: MAGNESIUM: 2.2 mg/dL (ref 1.7–2.4)

## 2016-05-05 MED ORDER — FUROSEMIDE 20 MG PO TABS: 40.0000 mg | ORAL_TABLET | Freq: Two times a day (BID) | ORAL | 0 refills | Status: DC

## 2016-05-05 MED ORDER — RANOLAZINE ER 1000 MG PO TB12: 1000.0000 mg | ORAL_TABLET | Freq: Two times a day (BID) | ORAL | 0 refills | Status: DC

## 2016-05-05 MED ORDER — MIDODRINE HCL 10 MG PO TABS: 10.0000 mg | ORAL_TABLET | Freq: Three times a day (TID) | ORAL | Status: AC

## 2016-05-05 MED ORDER — RANOLAZINE ER 1000 MG PO TB12: 1000.0000 mg | ORAL_TABLET | Freq: Two times a day (BID) | ORAL | 0 refills | Status: AC

## 2016-05-05 MED ORDER — LEVOFLOXACIN 500 MG PO TABS: 500.0000 mg | ORAL_TABLET | Freq: Every day | ORAL | 0 refills | Status: DC

## 2016-05-05 MED ORDER — ISOSORBIDE MONONITRATE ER 60 MG PO TB24: 60.0000 mg | ORAL_TABLET | Freq: Two times a day (BID) | ORAL | 0 refills | Status: AC

## 2016-05-05 MED ORDER — CARVEDILOL 3.125 MG PO TABS: 3.1250 mg | ORAL_TABLET | Freq: Two times a day (BID) | ORAL | 0 refills | Status: DC

## 2016-05-05 MED ORDER — INSULIN GLARGINE 100 UNIT/ML ~~LOC~~ SOLN
20.0000 [IU] | Freq: Every day | SUBCUTANEOUS | Status: DC
  Filled 2016-05-05: qty 0.2

## 2016-05-05 MED ORDER — FUROSEMIDE 20 MG PO TABS: 40.0000 mg | ORAL_TABLET | Freq: Two times a day (BID) | ORAL | 0 refills | Status: AC

## 2016-05-05 MED ORDER — POTASSIUM CHLORIDE CRYS ER 20 MEQ PO TBCR
40.0000 meq | EXTENDED_RELEASE_TABLET | Freq: Once | ORAL | Status: AC
  Administered 2016-05-05: 40 meq via ORAL
  Filled 2016-05-05: qty 2

## 2016-05-05 MED FILL — RANEXA ER 1,000 MG TABLET: 1000 | 30 days supply | Qty: 60 | Fill #0

## 2016-05-05 NOTE — Progress Notes (Signed)
Patient is alert and oriented, denies pain ,VSS. Iv and tele d/c. Instruction and prescription  explain and given to the patient and wife, all questions answered.Patient d/c home per order.

## 2016-05-05 NOTE — Telephone Encounter (Signed)
Called to follow up with patient.  Pt is currently in the hospital.  He said he has been working with his Endocrinologist for the [employer].  He said he doesn't need Edward's involvement at this time.  He will call for a hospital follow up once he's discharged from the hospital.

## 2016-05-05 NOTE — Progress Notes (Signed)
Physical Therapy Treatment Patient Details Name: Timothy Alvarez F [place]iston MRN: [id number] DOB: 11-08-1955 Today's Date: 05/05/2016    History of Present Illness Pt is a 61 yo male admitted to hospital w/ progressiviely worsening dyspnea, found to have fluid overload and acute on chronic congestive heart failure. PMH includes; CAD, cataracts, CHF, type 1 diabetes uncontrolled, heart attack x5, and hyperlipidemia.     PT Comments    Pt is up to walk with PT on RW for a very short trip due to his struggle to maintain NWB on R foot.  Knee walker not available, but did get to see how he is managing.  Pt goes to [place] alone with RW but is WBing likely on R foot to go that far.  Pt is to continue acutely for strengthening and standing/walking control to assist with objective of protecting R foot.  Follow Up Recommendations  Home health PT     Equipment Recommendations  None recommended by PT    Recommendations for Other Services       Precautions / Restrictions Precautions Precautions: Fall Restrictions Weight Bearing Restrictions: Yes RLE Weight Bearing: Non weight bearing Other Position/Activity Restrictions: uses knee scooter    Mobility  Bed Mobility Overal bed mobility: Modified Independent Bed Mobility: Supine to Sit;Sit to Supine     Supine to sit: Modified independent (Device/Increase time) Sit to supine: Modified independent (Device/Increase time)      Transfers Overall transfer level: Needs assistance Equipment used: Rolling walker (2 wheeled) Transfers: Pharmacologisttand Pivot Transfers;Sit to/from Stand Sit to Stand: Min guard;Min assist Stand pivot transfers: Min guard;Min assist       General transfer comment: cues for maintaining NWB  Ambulation/Gait Ambulation/Gait assistance: Min guard Ambulation Distance (Feet): 12 Feet Assistive device: Rolling walker (2 wheeled);1 person hand held assist Gait Pattern/deviations: Trunk flexed;Step-to pattern;Decreased stride length Gait  velocity: reduced Gait velocity interpretation: Below normal speed for age/gender General Gait Details: RW used due to lack of scooter but shortened due to pt not doing a good job maintaining NWB, only has transfer permission to use light pressure on RLE   Stairs            Wheelchair Mobility    Modified Rankin (Stroke Patients Only)       Balance     Sitting balance-Leahy Scale: Good     Standing balance support: During functional activity Standing balance-Leahy Scale: Fair                      Cognition Arousal/Alertness: Awake/alert Behavior During Therapy: WFL for tasks assessed/performed Overall Cognitive Status: Within Functional Limits for tasks assessed                      Exercises General Exercises - Lower Extremity Ankle Circles/Pumps: AROM;Both;5 reps Long Arc Quad: Strengthening;Both;15 reps Heel Slides: Strengthening;Both;15 reps Hip ABduction/ADduction: Strengthening;Both;15 reps Straight Leg Raises: AROM;Both;10 reps    General Comments General comments (skin integrity, edema, etc.): Sat and ckd BP with sitting at 127/61. sat 93%.  Standing was 123/66 and reported to nursing.      Pertinent Vitals/Pain Pain Assessment: No/denies pain    Home Living                      Prior Function            PT Goals (current goals can now be found in the care plan section) Acute Rehab PT Goals Patient Stated Goal:  get home PT Goal Formulation: With patient Progress towards PT goals: Progressing toward goals    Frequency    Min 3X/week      PT Plan Current plan remains appropriate    Co-evaluation             End of Session   Activity Tolerance: Patient tolerated treatment well Patient left: in bed;with call bell/phone within reach Nurse Communication: Mobility status;Other (comment) (vitals with sitting and standing) PT Visit Diagnosis: Other abnormalities of gait and mobility (R26.89)     Time:  4098-1191 PT Time Calculation (min) (ACUTE ONLY): 27 min  Charges:  $Gait Training: 8-22 mins $Therapeutic Exercise: 8-22 mins                    G Codes:       Ivar Drape May 20, 2016, 11:59 AM   Samul Dada, PT MS Acute Rehab Dept. Number: [facility] [id number] and [facility] [phone]

## 2016-05-05 NOTE — Progress Notes (Signed)
Progress Note  Patient Name: Timothy Alvarez Date of Encounter: 05/05/2016  Primary Cardiologist: Dr. Jens Som   Subjective   Orthostatics revealed a drop in BP from 127/65 to 100/75 standing (nadir), minimally symptomatic though. He feels so much better today, inquiring about discharge. His weight has not changed much but he also reports he is eating more here in the hospital overall than he eats at home (here he is eating frequent regular meals, at home he sometimes skips meals).  Inpatient Medications    Scheduled Meds: . acidophilus  1 capsule Oral Daily  . aspirin EC  81 mg Oral Daily  . atropine  1 drop Right Eye BID  . brimonidine  1 drop Left Eye BID   And  . timolol  1 drop Left Eye BID  . carvedilol  3.125 mg Oral BID WC  . ciprofloxacin  1 drop Right Eye Q4H while awake  . clopidogrel  75 mg Oral Daily  . enoxaparin (LOVENOX) injection  40 mg Subcutaneous Q24H  . famotidine  20 mg Oral Daily  . furosemide  40 mg Oral BID  . gabapentin  600 mg Oral TID  . insulin aspart  0-15 Units Subcutaneous TID WC  . insulin aspart  0-5 Units Subcutaneous QHS  . insulin aspart  5 Units Subcutaneous TID WC  . insulin glargine  20 Units Subcutaneous QHS  . isosorbide mononitrate  60 mg Oral BID  . latanoprost  1 drop Left Eye QHS  . [START ON 05/06/2016] levofloxacin  750 mg Oral Q24H  . losartan  12.5 mg Oral Daily  . midodrine  10 mg Oral TID AC  . potassium chloride SA  20 mEq Oral Daily  . prednisoLONE acetate  1 drop Right Eye BID  . ranolazine  1,000 mg Oral BID  . rosuvastatin  40 mg Oral q1800  . senna-docusate  1 tablet Oral Daily  . sodium chloride flush  3 mL Intravenous Q12H   Continuous Infusions:  PRN Meds: albuterol, benzonatate, HYDROcodone-acetaminophen, meclizine, nitroGLYCERIN, zolpidem   Vital Signs    Vitals:   05/05/16 0136 05/05/16 0505 05/05/16 1040 05/05/16 1157  BP: 118/61 (!) 112/56 132/72 133/73  Pulse: 73 76 75 70  Resp: 18 18  18     Temp: 98.6 F (37 C) 98.3 F (36.8 C)  97.9 F (36.6 C)  TempSrc: Oral Oral  Oral  SpO2: 99% 94%  96%  Weight:  195 lb 1.6 oz (88.5 kg)    Height:        Intake/Output Summary (Last 24 hours) at 05/05/16 1504 Last data filed at 05/05/16 1300  Gross per 24 hour  Intake              760 ml  Output             2425 ml  Net            -1665 ml   Filed Weights   05/02/16 0441 05/03/16 0508 05/05/16 0505  Weight: 195 lb 4.8 oz (88.6 kg) 197 lb 4.8 oz (89.5 kg) 195 lb 1.6 oz (88.5 kg)    Telemetry    NSR  Physical Exam   GEN: No acute distress.  HEENT: Normocephalic, atraumatic, sclera non-icteric. Neck: No JVD or bruits. Cardiac: RRR no murmurs, rubs, or gallops.  Radials/DP/PT 1+ and equal bilaterally.  Respiratory: Clear to auscultation bilaterally. Breathing is unlabored. GI: Soft, nontender, non-distended, BS +x 4. MS: no deformity. Extremities: No clubbing  or cyanosis. Trace nonpitting BLE edema. Distal pedal pulses are 2+ and equal bilaterally. Neuro:  AAOx3. Follows commands. Psych:  Responds to questions appropriately with a normal affect.  Labs    Chemistry Recent Labs Lab 05/04/16 0507 05/04/16 1635 05/05/16 0409  NA 139 138 139  K 3.1* 3.9 3.2*  CL 98* 99* 96*  CO2 29 30 30   GLUCOSE 63* 150* 81  BUN 23* 24* 26*  CREATININE 1.56* 1.77* 1.85*  CALCIUM 9.0 9.0 8.7*  GFRNONAA 46* 39* 37*  GFRAA 53* 46* 43*  ANIONGAP 12 9 13      Hematology Recent Labs Lab 04/30/16 0406 05/03/16 0357 05/04/16 0507  WBC 7.4 8.5 6.9  RBC 4.17* 4.04* 4.10*  HGB 11.5* 11.2* 11.6*  HCT 36.0* 34.9* 35.2*  MCV 86.3 86.4 85.9  MCH 27.6 27.7 28.3  MCHC 31.9 32.1 33.0  RDW 14.7 14.7 14.8  PLT 197 222 255    Cardiac Enzymes Recent Labs Lab 04/29/16 1807 04/29/16 2228 04/30/16 0406  TROPONINI 0.56* 0.51* 0.49*    Recent Labs Lab 04/29/16 1043  TROPIPOC 0.69*     BNP Recent Labs Lab 04/29/16 1025 05/02/16 0526  BNP 303.6* 208.0*     DDimer No  results for input(s): DDIMER in the last 168 hours.   Radiology    No results found.  Cardiac Studies   Echocardiogram: 05/01/2016 Study Conclusions - Left ventricle: The cavity size was normal. Wall thickness was increased in a pattern of mild LVH. Systolic function was moderately to severely reduced. The estimated ejection fraction was in the range of 30% to 35%. Features are consistent with a pseudonormal left ventricular filling pattern, with concomitant abnormal relaxation and increased filling pressure (grade 2 diastolic dysfunction). Doppler parameters are consistent with high ventricular filling pressure. - Regional wall motion abnormality: Severe hypokinesis of the apical anterior, apical septal, apical lateral, and apical myocardium; moderate hypokinesis of the mid anterior, mid anteroseptal, apical inferior, and mid inferolateral myocardium; mild hypokinesis of the mid inferior myocardium. Basal anterior and anterolateral hypokinesis. - Aortic valve: Moderately calcified annulus. Trileaflet. - Mitral valve: Severely calcified annulus. There was mild regurgitation. - Left atrium: The atrium was moderately dilated. - Right ventricle: Systolic function was mildly reduced. - Inferior vena cava: Estimated CVP 15 mmHg.  Patient Profile     61 y.o. male of multivessel CAD (severe diffuse CAD by cath in 03/2015, declined CABG in the past), IDDM, chronic diastolic CHF (EF 11-91%50-55% by echo in 03/2015), HTN, and HLD admitted on 04/29/2016 for worsening dyspnea. Weight up 14 lbs from baseline despite increasing PO Lasix intake. Echo this admission shows reduced EF of 30-35% with WMA.   Assessment & Plan    1. Acute on chronic combined CHF with new LV dysfunction this admission - EF was 50-55% by echo in 03/2015. Repeat echo this admission shows EF of 30% to 35%, Grade 2 DD, and severe hypokinesis of the apical anterior, apical septal, apical lateral, and  apical myocardium. - clinically he has diuresed well although weight has not changed much. However, he does say that he is now eating more overall food here in the hospital since admission. - now on low dose BB. Will hold ARB today given Cr rise. - creatinine continues to rise. Would hold any further metolazone. Will review diuretic regimen with MD.  2. CAD/elevated troponin - severe diffuse CAD by cath in 03/2015, PCI was thought to be high-risk at the time of his last cath and not felt to  be a good CABG candidate secondary to poor targets. Cath not pursued in 03/2016 due to the patient declining further workup and it would not likely change the plan.  - troponin values flat at 0.51 and 0.49, similar to values during 03/2016 admission. No plans for further ischemic evaluation at this time.  - Ranexa titrated this admission, Imdur reduced due to orthostasis  3. Essential HTN with history of orthostatic hypotension - Florinef discontinued due to CHF; midodrine titrated to TID - in the face of continued orthostatic drop, hold ARB  4. AKI on CKD stage III, with hypokalemia - see above - on KCl daily, give additional once  5. Abnormal CXR - CXR on 2/19 shows degree of CHF with consolidation along medial left base, suspicious of focal PNA - started on Levaquin by admitting team  Signed, Laurann Montana, PA-C  05/05/2016, 3:04 PM    I have seen and examined the patient along with Laurann Montana, PA-C.  I have reviewed the chart, notes and new data.  I agree with PA's note.  He feels better and is eager to go home. He understands that we have made numerous and recent changes in meds and he will require close and meticulous follow up of labs, weight and clinical status. Still above estimated "dry weight", but no longer orthopneic and has significant orthostatic drop in BP so will continue with slow outpatient diuresis. Low K earlier after metolazone the day before. Supplemented. Check  Labs on Monday. Clinical f/u in about a week. Recommend compression stockings. May eventually require referral to CHF clinic for advanced therapies (he does not want LVAD, but would consider OHT).   Thurmon Fair, MD, [facility] [phone] 05/05/2016, 4:35 PM

## 2016-05-05 NOTE — Discharge Summary (Signed)
Physician Discharge Summary  Timothy Alvarez ERX:[id number] DOB: [birth year] DOA: 04/29/2016  PCP: Mackie Pai, PA-C  Admit date: 04/29/2016 Discharge date: 05/05/2016  Time spent: 45 minutes  Recommendations for Outpatient Follow-up:  1. PCP Dr.Saguir in 1 week 2. Cards Benard Rink PA on 3/2, labs in 1 week  Discharge Diagnoses:    Acute on chronic systolic and diastolic CHF   CAD S/P percutaneous coronary angioplasty   Orthostatic hypotension   Essential hypertension   Fluid overload   Dyspnea   Acute on chronic combined systolic and diastolic CHF (congestive heart failure) (HCC)   Elevated troponin   Acute kidney injury superimposed on chronic kidney disease ([place])   Hypokalemia   Discharge Condition: stable  Diet recommendation: low sodium, heart healthy  Filed Weights   05/03/16 0508 05/05/16 0505 05/05/16 1513  Weight: 89.5 kg (197 lb 4.8 oz) 88.5 kg (195 lb 1.6 oz) 88.7 kg (195 lb 8 oz)    History of present illness:  61 y.o.malewith medical history significant of multivessel CAD (severe diffuse CAD by cath in 03/2015, declined CABG in the past), IDDM, chronic diastolic CHF (EF 95-09% by echo in 03/2015), HTN, and HLD presented to [facility] with main concern of several days duration of progressively worsening dyspnea, associated with 14 lbs weight gain.  Hospital Course:  Acute systolic and diastolic CHF  - was diuresed with IV lasix initially and then changed to PO was also given a single dose of metolazone 2days back.  - diuresed well, 8.5L negative  - ECHO with EF down to 30-35% with grade II diastolic CHF, severe hypokinesis of the apical anterior, apical septal, apical lateral, and apical myocardium. - Was not on BB therapy prior to admission secondary to history of bradycardia, low dose Coreg added, stopped ARB due to orthostasis and renal insufficiency - discharged home on PO lasix 11m BID, needs lab work in 1 week and cards FU made for 3/2 -  imdur dose cut down due to orthostasis and Florinef stopped due to CHF  CAD -severe diffuse CAD by Cath in 1/17 -not felt to be a good CABG candidate secondary to poor targets. -Troponin flat trend this time -continue ASA/plavix/coreg/statin/imdur   H/o Orthostatic hypotension -on midodrine/fluodrocortisone -florinef stopped due to CHF    ? LLL PNA -Levaquin added by Dr.Myers 2/20, Rx for 5days total    Acute kidney injury - related to diurteics, ARB,  - creatinine 1.5-1.6 at baseline, 1.8 at discharge, ARB stopped, FU labs in 1 week post discharge    Hyponatremia - resolved   DM type II - was on lantus inpatient, changed back to Insulin pump at discharge - A1C 7.5 (05/01/2016)  HTN, essential  - continue home medical regimen  - reasonable inpatient control, SBP in 140's this AM   Obesity  - Body mass index is 31.34 kg/m.  Consultations:  Cards  Discharge Exam: Vitals:   05/05/16 1040 05/05/16 1157  BP: 132/72 133/73  Pulse: 75 70  Resp:  18  Temp:  97.9 F (36.6 C)    General: AAOx3 Cardiovascular: S1S2/RRR Respiratory: CTAB  Discharge Instructions   Discharge Instructions    Diet - low sodium heart healthy    Complete by:  As directed    Increase activity slowly    Complete by:  As directed      Current Discharge Medication List    START taking these medications   Details  carvedilol (COREG) 3.125 MG tablet Take 1 tablet (  3.125 mg total) by mouth 2 (two) times daily with a meal. Qty: 30 tablet, Refills: 0    levofloxacin (LEVAQUIN) 500 MG tablet Take 1 tablet (500 mg total) by mouth daily. For 2days Qty: 2 tablet, Refills: 0    ranolazine (RANEXA) 1000 MG SR tablet Take 1 tablet (1,000 mg total) by mouth 2 (two) times daily. Qty: 60 tablet, Refills: 0      CONTINUE these medications which have CHANGED   Details  furosemide (LASIX) 20 MG tablet Take 2 tablets (40 mg total) by mouth 2 (two) times daily. Qty: 60 tablet,  Refills: 0    isosorbide mononitrate (IMDUR) 60 MG 24 hr tablet Take 1 tablet (60 mg total) by mouth 2 (two) times daily. Qty: 60 tablet, Refills: 0    midodrine (PROAMATINE) 10 MG tablet Take 1 tablet (10 mg total) by mouth 3 (three) times daily.      CONTINUE these medications which have NOT CHANGED   Details  aspirin 81 MG tablet Take 81 mg by mouth daily.    atropine 1 % ophthalmic solution Place 1 drop into the right eye 2 (two) times daily.     brimonidine-timolol (COMBIGAN) 0.2-0.5 % ophthalmic solution Place 1 drop into the left eye every 12 (twelve) hours.     ciprofloxacin (CILOXAN) 0.3 % ophthalmic solution Place 1 drop into the right eye 3 (three) times daily. Administer 1 drop, every 2 hours, while awake, for 2 days. Then 1 drop, every 4 hours, while awake, for the next 5 days.    clopidogrel (PLAVIX) 75 MG tablet Take 1 tablet (75 mg total) by mouth daily. Qty: 90 tablet, Refills: 3    co-enzyme Q-10 30 MG capsule Take 30 mg by mouth daily.     Difluprednate (DUREZOL) 0.05 % EMUL Place 1 drop into the right eye 3 (three) times daily.    gabapentin (NEURONTIN) 600 MG tablet TAKE 1 TABLET BY MOUTH 3 TIMES DAILY. Qty: 90 tablet, Refills: 2    glucagon 1 MG injection Inject 1 mg into the vein once as needed (Emergency Test Kit). Reported on 04/07/2015    HYDROcodone-acetaminophen (NORCO/VICODIN) 5-325 MG tablet Take 1 tablet by mouth every 6 (six) hours as needed for severe pain. Qty: 60 tablet, Refills: 0    insulin lispro (HUMALOG) 100 UNIT/ML injection Inject into the skin as directed. Use in Insulin Pump, Max daily dose: 100 units/day    latanoprost (XALATAN) 0.005 % ophthalmic solution Place 1 drop into the left eye at bedtime.     nitroGLYCERIN (NITROSTAT) 0.4 MG SL tablet Place 1 tablet (0.4 mg total) under the tongue every 5 (five) minutes x 3 doses as needed for chest pain. Qty: 25 tablet, Refills: 12    potassium chloride SA (K-DUR,KLOR-CON) 20 MEQ tablet  Take 1 tablet (20 mEq total) by mouth daily. Qty: 30 tablet, Refills: 6    ranitidine (ZANTAC) 150 MG tablet TAKE 1 TABLET BY MOUTH TWICE DAILY Qty: 60 tablet, Refills: 5    rosuvastatin (CRESTOR) 40 MG tablet Take 40 mg by mouth daily.    SENEXON-S 8.6-50 MG tablet TAKE 1 TABLET BY MOUTH DAILY. Qty: 100 tablet, Refills: 1      STOP taking these medications     fludrocortisone (FLORINEF) 0.1 MG tablet      losartan (COZAAR) 25 MG tablet      meclizine (ANTIVERT) 12.5 MG tablet      zolpidem (AMBIEN CR) 12.5 MG CR tablet  Allergies  Allergen Reactions  . Contrast Media [Iodinated Diagnostic Agents] Other (See Comments)    Renal Failure  . Metrizamide Other (See Comments)    Renal Failure  . Lisinopril Cough   Follow-up Information    Saguier, Iris Pert. Schedule an appointment as soon as possible for a visit in 1 week(s).   Specialties:  Internal Medicine, Family Medicine Contact information: [address] [place] [address] [phone]        [facility] Follow up.   Why:  Please take lab slip to the lab on Monday 05/09/16 to have your electrolytes drawn. This is the lab on the first floor of the building that [facility] is located in, in the [place] [employer] area. Contact information: [address], [address] [place], [place] [address] [phone] (Phone) Hours: 8am-12:30pm and 1-4:30pm       Erma Heritage, PA Follow up.   Specialties:  Physician Assistant, Cardiology Why:  05/13/16 at 10:30am - [place] is one of the PAs that works closely with Dr. Stanford Breed. This is the [employer] office at [facility]. Contact information: [address] [place] [address] [phone]            The results of significant diagnostics from this hospitalization (including imaging, microbiology, ancillary and laboratory) are listed below for reference.    Significant Diagnostic Studies: Dg  Chest 2 View  Result Date: 05/02/2016 CLINICAL DATA:  Shortness of Breath EXAM: CHEST  2 VIEW COMPARISON:  April 29, 2016 FINDINGS: There remains interstitial edema with cardiomegaly and pulmonary venous hypertension. There is a a new focus of airspace consolidation in the medial left base. No airspace consolidation evident. No adenopathy. No pneumothorax. Bones osteoporotic. IMPRESSION: Evidence a degree of congestive heart failure, stable. New consolidation medial left base. Suspect focal pneumonia, although alveolar edema in this area could present in this manner. No other airspace consolidation. Electronically Signed   By: Lowella Grip III M.D.   On: 05/02/2016 07:08   Dg Chest 2 View  Result Date: 04/29/2016 CLINICAL DATA:  Shortness of breath. EXAM: CHEST  2 VIEW COMPARISON:  Radiographs of March 26, 2016. FINDINGS: The heart size and mediastinal contours are within normal limits. No pneumothorax is noted. Mild central pulmonary vascular congestion is noted. Possible minimal bibasilar pulmonary edema may be present. Minimal bilateral pleural effusions are noted. The visualized skeletal structures are unremarkable. IMPRESSION: Mild central pulmonary vascular congestion. Minimal bilateral pleural effusions are noted. Possible minimal bibasilar pulmonary edema. Electronically Signed   By: Marijo Conception, M.D.   On: 04/29/2016 10:59    Microbiology: No results found for this or any previous visit (from the past 240 hour(s)).   Labs: Basic Metabolic Panel:  Recent Labs Lab 05/02/16 0526 05/03/16 0357 05/04/16 0507 05/04/16 1100 05/04/16 1635 05/05/16 0409  NA 134* 137 139  --  138 139  K 4.5 4.0 3.1*  --  3.9 3.2*  CL 97* 100* 98*  --  99* 96*  CO2 _0 --  30 30  GLUCOSE 434* 212* 63*  --  150* 81  BUN 22* 24* 23*  --  24* 26*  CREATININE 1.59* 1.61* 1.56*  --  1.77* 1.85*  CALCIUM 8.7* 8.9 9.0  --  9.0 8.7*  MG  --   --   --  2.2  --  2.2   Liver Function  Tests: No results for input(s): AST, ALT, ALKPHOS, BILITOT, PROT,  ALBUMIN in the last 168 hours. No results for input(s): LIPASE, AMYLASE in the last 168 hours. No results for input(s): AMMONIA in the last 168 hours. CBC:  Recent Labs Lab 04/29/16 1025 04/29/16 1807 04/30/16 0406 05/03/16 0357 05/04/16 0507  WBC 5.3 5.9 7.4 8.5 6.9  NEUTROABS 3.2  --   --   --   --   HGB 11.7* 12.1* 11.5* 11.2* 11.6*  HCT 35.9* 37.4* 36.0* 34.9* 35.2*  MCV 87.3 87.0 86.3 86.4 85.9  PLT 193 194 197 222 255   Cardiac Enzymes:  Recent Labs Lab 04/29/16 1807 04/29/16 2228 04/30/16 0406  TROPONINI 0.56* 0.51* 0.49*   BNP: BNP (last 3 results)  Recent Labs  03/26/16 0958 04/29/16 1025 05/02/16 0526  BNP 251.7* 303.6* 208.0*    ProBNP (last 3 results)  Recent Labs  02/10/16 1030 02/17/16 1245  PROBNP 477.0* 695.0*    CBG:  Recent Labs Lab 05/04/16 1636 05/04/16 2108 05/05/16 0224 05/05/16 0752 05/05/16 1122  GLUCAP 153* 88 101* 58* 202*       SignedDomenic Polite MD.  [facility] 05/05/2016, 4:22 PM

## 2016-05-05 NOTE — Progress Notes (Signed)
Nutrition Education Note  RD received verbal consult from RN for nutrition education regarding low sodium diet for CHF per pt request.   RD provided "Heart Failure Nutrition Therapy" handout from the Academy of [employer] and Dietetics. Provided examples on ways to decrease sodium intake in diet. Discouraged intake of processed foods and use of salt shaker. Discussed sodium guidelines per day and per serving of food. Pt states this should be easy because he prefers bland foods and his wife cooks with lots of fresh vegetables. Encouraged fresh fruits and vegetables as well as whole grain sources of carbohydrates to maximize fiber intake.   Teach back method used. Expect good compliance.  Body mass index is 31.49 kg/m. Pt meets criteria for Obesity based on current BMI.  Current diet order is Heart Healthy/Carb Modified, patient is consuming approximately 100% of meals at this time. Labs and medications reviewed. No further nutrition interventions warranted at this time. RD offered nutrition information regarding diabetes, but pt states that he has had diabetes for 58 years and he controls his blood glucose better at home when he is on his insulin pump. RD contact information provided. If additional nutrition issues arise, please re-consult RD.   Dorothea Ogleeanne Kristle Wesch RD, LDN, CSP Inpatient Clinical Dietitian Pager: [phone] After Hours Pager: [phone]

## 2016-05-05 NOTE — Progress Notes (Signed)
[facility]. Also gave patient lab slip to check BMET on Monday. Will send message to our scheduler to also help facilitate f/u with [facility]. Dayna Dunn PA-C

## 2016-05-06 ENCOUNTER — Telehealth: Payer: Self-pay | Admitting: *Deleted

## 2016-05-06 ENCOUNTER — Encounter (HOSPITAL_COMMUNITY): Payer: Self-pay | Admitting: Medical

## 2016-05-06 MED FILL — levoFLOXacin 500 MG TABS: 500 | 2 days supply | Qty: 2 | Fill #0

## 2016-05-06 MED FILL — CARVEDILOL 3.125 MG TABLET: 3.125 | 15 days supply | Qty: 30 | Fill #0

## 2016-05-06 MED FILL — ISOSORBIDE MN ER 60 MG TAB: 60 | 30 days supply | Qty: 60 | Fill #0

## 2016-05-06 NOTE — Telephone Encounter (Signed)
Timothy Alvarez F Markert [id number] DOB: 09-18-1955 DOA: 04/29/2016  PCP: Esperanza RichtersSaguier, Edward, PA-C  Admit date: 04/29/2016 Discharge date: 05/05/2016  Time spent: 45 minutes  Recommendations for Outpatient Follow-up:  1. PCP Dr.Saguir in 1 week 2. Cards [name] PA on 3/2, labs in 1 week  Discharge Diagnoses:    Acute on chronic systolic and diastolic CHF   CAD S/P percutaneous coronary angioplasty   Orthostatic hypotension   Essential hypertension   Fluid overload   Dyspnea   Acute on chronic combined systolic and diastolic CHF (congestive heart failure) (HCC)   Elevated troponin   Acute kidney injury superimposed on chronic kidney disease (HCC)   Hypokalemia   Discharge Condition: stable  Diet recommendation: low sodium, heart healthy  Transition Care Management Follow-up Telephone Call    How have you been since you were released from the hospital? "I am feeling much better today thanks"   Do you understand why you were in the hospital? yes   Do you understand the discharge instructions? yes   Where were you discharged to? Home   Items Reviewed:  Medications reviewed: yes  Allergies reviewed: yes  Dietary changes reviewed: yes  Referrals reviewed: yes   Functional Questionnaire:   Activities of Daily Living (ADLs):   He states they are independent in the following: ambulation, bathing and hygiene, feeding, continence, grooming, toileting and dressing States they require assistance with the following: n/a   Any transportation issues/concerns?: no   Any patient concerns? no   Confirmed importance and date/time of follow-up visits scheduled yes  Provider Appointment booked with Esperanza RichtersEdward Saguier PA 05/09/16 @1   Confirmed with patient if condition begins to worsen call PCP or go to the ER.  Patient was given the office number and encouraged to call back with question or concerns.  : yes

## 2016-05-06 NOTE — Progress Notes (Signed)
Mailed patient letter with information about Cardiac Rehab program. MW °

## 2016-05-07 NOTE — Telephone Encounter (Signed)
Pt will need at least 30 minutes. Very complicated pt. Hospital followup likely next appointment and may need forms filled. He can't be seen in 15 minute slot.

## 2016-05-09 ENCOUNTER — Ambulatory Visit (INDEPENDENT_AMBULATORY_CARE_PROVIDER_SITE_OTHER): Payer: Medicare HMO | Admitting: Medical

## 2016-05-09 ENCOUNTER — Other Ambulatory Visit: Payer: Self-pay

## 2016-05-09 ENCOUNTER — Encounter (HOSPITAL_BASED_OUTPATIENT_CLINIC_OR_DEPARTMENT_OTHER): Payer: Self-pay | Admitting: *Deleted

## 2016-05-09 ENCOUNTER — Encounter: Payer: Self-pay | Admitting: Medical

## 2016-05-09 ENCOUNTER — Emergency Department (HOSPITAL_BASED_OUTPATIENT_CLINIC_OR_DEPARTMENT_OTHER)
Admission: EM | Admit: 2016-05-09 | Discharge: 2016-05-09 | Disposition: A | Discharge status: Home / Self Care | Payer: Medicare HMO | Attending: Emergency Medicine | Admitting: Emergency Medicine

## 2016-05-09 ENCOUNTER — Telehealth: Payer: Self-pay | Admitting: Emergency Medicine

## 2016-05-09 ENCOUNTER — Ambulatory Visit (HOSPITAL_BASED_OUTPATIENT_CLINIC_OR_DEPARTMENT_OTHER)
Admission: RE | Admit: 2016-05-09 | Discharge: 2016-05-09 | Disposition: A | Discharge status: Home / Self Care | Payer: Medicare HMO | Source: Ambulatory Visit | Attending: Medical | Admitting: Medical

## 2016-05-09 VITALS — BP 94/58 | HR 71 | Temp 98.2°F | Resp 16 | Ht 66.0 in | Wt 194.0 lb

## 2016-05-09 DIAGNOSIS — I110 Hypertensive heart disease with heart failure: Secondary | ICD-10-CM | POA: Insufficient documentation

## 2016-05-09 DIAGNOSIS — Z7982 Long term (current) use of aspirin: Secondary | ICD-10-CM | POA: Insufficient documentation

## 2016-05-09 DIAGNOSIS — Z87898 Personal history of other specified conditions: Secondary | ICD-10-CM

## 2016-05-09 DIAGNOSIS — I2510 Atherosclerotic heart disease of native coronary artery without angina pectoris: Secondary | ICD-10-CM | POA: Insufficient documentation

## 2016-05-09 DIAGNOSIS — R7989 Other specified abnormal findings of blood chemistry: Secondary | ICD-10-CM | POA: Diagnosis present

## 2016-05-09 DIAGNOSIS — R5383 Other fatigue: Secondary | ICD-10-CM | POA: Insufficient documentation

## 2016-05-09 DIAGNOSIS — I5043 Acute on chronic combined systolic (congestive) and diastolic (congestive) heart failure: Secondary | ICD-10-CM | POA: Diagnosis not present

## 2016-05-09 DIAGNOSIS — Z8709 Personal history of other diseases of the respiratory system: Secondary | ICD-10-CM | POA: Diagnosis not present

## 2016-05-09 DIAGNOSIS — R39198 Other difficulties with micturition: Secondary | ICD-10-CM | POA: Diagnosis not present

## 2016-05-09 DIAGNOSIS — I5042 Chronic combined systolic (congestive) and diastolic (congestive) heart failure: Secondary | ICD-10-CM

## 2016-05-09 DIAGNOSIS — R140 Abdominal distension (gaseous): Secondary | ICD-10-CM | POA: Diagnosis not present

## 2016-05-09 DIAGNOSIS — R0602 Shortness of breath: Secondary | ICD-10-CM | POA: Diagnosis not present

## 2016-05-09 DIAGNOSIS — R531 Weakness: Secondary | ICD-10-CM

## 2016-05-09 DIAGNOSIS — N189 Chronic kidney disease, unspecified: Secondary | ICD-10-CM | POA: Diagnosis not present

## 2016-05-09 DIAGNOSIS — I509 Heart failure, unspecified: Secondary | ICD-10-CM | POA: Diagnosis not present

## 2016-05-09 DIAGNOSIS — R200 Anesthesia of skin: Secondary | ICD-10-CM | POA: Diagnosis not present

## 2016-05-09 DIAGNOSIS — Z8679 Personal history of other diseases of the circulatory system: Secondary | ICD-10-CM | POA: Diagnosis not present

## 2016-05-09 DIAGNOSIS — Z79899 Other long term (current) drug therapy: Secondary | ICD-10-CM | POA: Insufficient documentation

## 2016-05-09 DIAGNOSIS — E108 Type 1 diabetes mellitus with unspecified complications: Secondary | ICD-10-CM | POA: Insufficient documentation

## 2016-05-09 DIAGNOSIS — Z794 Long term (current) use of insulin: Secondary | ICD-10-CM | POA: Insufficient documentation

## 2016-05-09 DIAGNOSIS — E1022 Type 1 diabetes mellitus with diabetic chronic kidney disease: Secondary | ICD-10-CM

## 2016-05-09 LAB — CBC WITH DIFFERENTIAL/PLATELET
BASOS PCT: 1.3 % (ref 0.0–3.0)
Basophils Absolute: 0.1 10*3/uL (ref 0.0–0.1)
EOS ABS: 0.3 10*3/uL (ref 0.0–0.7)
EOS PCT: 5.1 % — AB (ref 0.0–5.0)
HEMATOCRIT: 38.3 % — AB (ref 39.0–52.0)
HEMOGLOBIN: 12.5 g/dL — AB (ref 13.0–17.0)
LYMPHS PCT: 21.5 % (ref 12.0–46.0)
Lymphs Abs: 1.3 10*3/uL (ref 0.7–4.0)
MCHC: 32.6 g/dL (ref 30.0–36.0)
MCV: 86.4 fl (ref 78.0–100.0)
Monocytes Absolute: 0.5 10*3/uL (ref 0.1–1.0)
Monocytes Relative: 8.7 % (ref 3.0–12.0)
Neutro Abs: 3.8 10*3/uL (ref 1.4–7.7)
Neutrophils Relative %: 63.4 % (ref 43.0–77.0)
Platelets: 333 10*3/uL (ref 150.0–400.0)
RBC: 4.43 Mil/uL (ref 4.22–5.81)
RDW: 15.6 % — AB (ref 11.5–15.5)
WBC: 6 10*3/uL (ref 4.0–10.5)

## 2016-05-09 LAB — COMPREHENSIVE METABOLIC PANEL
ALT: 19 U/L (ref 0–53)
AST: 18 U/L (ref 0–37)
Albumin: 4.1 g/dL (ref 3.5–5.2)
Alkaline Phosphatase: 131 U/L — ABNORMAL HIGH (ref 39–117)
BUN: 35 mg/dL — ABNORMAL HIGH (ref 6–23)
CO2: 32 meq/L (ref 19–32)
Calcium: 9.4 mg/dL (ref 8.4–10.5)
Chloride: 99 mEq/L (ref 96–112)
Creatinine, Ser: 2.07 mg/dL — ABNORMAL HIGH (ref 0.40–1.50)
GFR: 34.69 mL/min — AB (ref >60.00)
GLUCOSE: 165 mg/dL — AB (ref 70–99)
POTASSIUM: 5.3 meq/L — AB (ref 3.5–5.1)
Sodium: 140 mEq/L (ref 135–145)
Total Bilirubin: 0.6 mg/dL (ref 0.2–1.2)
Total Protein: 7.4 g/dL (ref 6.0–8.3)

## 2016-05-09 LAB — BRAIN NATRIURETIC PEPTIDE: Pro B Natriuretic peptide (BNP): 228 pg/mL — ABNORMAL HIGH (ref 0.0–100.0)

## 2016-05-09 LAB — URINALYSIS, ROUTINE W REFLEX MICROSCOPIC
Bilirubin Urine: NEGATIVE
Glucose, UA: NEGATIVE mg/dL
Hgb urine dipstick: NEGATIVE
KETONES UR: NEGATIVE mg/dL
LEUKOCYTES UA: NEGATIVE
NITRITE: NEGATIVE
PH: 7.5 (ref 5.0–8.0)
Protein, ur: NEGATIVE mg/dL
Specific Gravity, Urine: 1.011 (ref 1.005–1.030)

## 2016-05-09 LAB — TROPONIN I
TNIDX: 0.27 ug/l — ABNORMAL HIGH (ref 0.00–0.06)
Troponin I: 0.23 ng/mL (ref <0.03)

## 2016-05-09 MED ORDER — NITROGLYCERIN 0.4 MG SL SUBL: 0.4000 mg | SUBLINGUAL_TABLET | SUBLINGUAL | 12 refills | Status: AC | PRN

## 2016-05-09 MED ORDER — FUROSEMIDE 10 MG/ML IJ SOLN
80.0000 mg | Freq: Once | INTRAMUSCULAR | Status: AC
  Administered 2016-05-09: 80 mg via INTRAVENOUS
  Filled 2016-05-09: qty 8

## 2016-05-09 MED ORDER — HYDROCODONE-ACETAMINOPHEN 5-325 MG PO TABS: 1.0000 | ORAL_TABLET | Freq: Four times a day (QID) | ORAL | 0 refills | Status: AC | PRN

## 2016-05-09 MED FILL — NITROGLYCERIN 0.4 MG TAB SL: 0.4 | 7 days supply | Qty: 25 | Fill #0

## 2016-05-09 MED FILL — HYDROCODON-APAP 5-325: 5-325 | 7 days supply | Qty: 30 | Fill #0

## 2016-05-09 NOTE — ED Notes (Signed)
ED Provider at bedside. 

## 2016-05-09 NOTE — ED Notes (Signed)
Pt was able to ambulate in room. Pt normally uses a walker at home and is non weight bearing to right extremity. Pt was able to walk around bed, some increased SOB, resolved with rest, post ambulation saturations 100%. No chest pain.

## 2016-05-09 NOTE — Progress Notes (Signed)
Subjective:    Patient ID: Timothy Alvarez, male    DOB: 03-11-1956, 61 y.o.   MRN: [id number]  HPI  Pt in for a follow up. He has history of CAD and Acute on chronic systolic and diastolic chf. He has episodes of fluid overload and dyspnea. Also recent had acute kidney injury superimposed on his chronic kidney disease.  Most recently he was admitted to for progressively worsening dyspnea that initially started with exertion and has progressed to dyspnea at rest in the past 24 hours. This as associated with 14 lbs weight gain, difficulty with ambulation and occasional but not consistent chest pressure. He was transferred to [facility] for further evaluation and cardiology team was consul.ted for assistance.    Pt hospital course showed diuresed with lasix and metolazone. His echo showed EF of 30-35% and hypokinesis. Low dose Coreg added and ARB stopped due to orthostasis and renal insufficiency. He was discharged hom on lasix 40 mg bid.  His imdur was cut down and florinef due to chf.  Pt got bed through his cardiologist and I will looks at paperwork for gelpad for matress.    With pt has know CAD disease as described below:   Cardiac Catheterization: 03/18/2015 1. Mid RCA lesion, 30% stenosed. The lesion was previously treated with a stent (unknown type). 2. Mid RCA to Dist RCA lesion, 50% stenosed. 3. 1st RPLB lesion, 90% stenosed. 4. RPDA-2 lesion, 60% stenosed. 5. RPDA-1 lesion, 65% stenosed. 6. Ost RCA to Prox RCA lesion, 50% stenosed. 7. Ost 1st Mrg to 1st Mrg lesion, 95% stenosed. 8. 1st Mrg lesion, 75% stenosed. 9. 1st Diag lesion, 95% stenosed. 10. Mid LAD lesion, 99% stenosed. 11. Dist LAD lesion, 90% stenosed. 75. Ost LAD to Prox LAD lesion, 75% stenosed.   Severe diffuse LAD, and circumflex coronary artery disease. Diffuse disease in the distal RCA.  Patent mid right coronary stent.  Left ventriculography was not performed.LVEDP is 24  mmHg   Recommendations:   Continue aggressive medical therapy. If the patient has never formally had a surgical consultation for CABG, it would be appropriate to get a surgical opinion at this time. The diffuse nature of the patient's coronary disease makes PCI high risk and unlikely to be of benefit. This could also make the patient non-surgical (inadequate targets for grafting)(patient is on ranexa and crestor)   In past with episodes of chf he would get elevated troponins and subsequently be admitted.  For his chf is on lasix.  Pt weight looks stable compared to weight 4 days ago.  Pt still feels very weak. States even short distance walks will make him very sob. Pt weight on discharge was 195. For short time at home weight was 191 since discharge.   Pt states he feels chronically feels tired.  Pt thinks he our scale is inaccurate but his wife shakes he head indicating she thinks weight is accurate.  He needs refill of pain meds. For his foot and back pain.  Review of Systems  Constitutional: Negative for chills and fatigue.  Eyes:       Pt had eye surgery since last visit.   Respiratory: Positive for shortness of breath. Negative for cough, chest tightness and wheezing.        He has mild dry cough.  Cardiovascular: Negative for chest pain and palpitations.  Musculoskeletal: Positive for gait problem. Negative for back pain.       His rt foot still in boot and this contributes to  balance problems.  Neurological: Negative for seizures, syncope, facial asymmetry, weakness and numbness.  Hematological: Negative for adenopathy. Does not bruise/bleed easily.  Psychiatric/Behavioral: Negative for behavioral problems, confusion, dysphoric mood and sleep disturbance. The patient is not nervous/anxious.     Past Medical History:  Diagnosis Date  . CAD (coronary artery disease)    a. 03/2015: cath showing severe diffuse CAD along LAD, LCx, and distal RCA. Patient declined CABG  with medical management recommended.  . Cataracts, bilateral   . CHF (congestive heart failure) ([place])   . Chicken pox   . Contrast dye induced nephropathy   . Diabetes type 1, uncontrolled ([place])   . Diabetic retinopathy ([place])   . Frequent headaches   . Heart attack 09.13.2014   x5  . Hyperlipidemia   . Measles, [place] (rubella)   . Mumps   . Orthostatic hypotension   . OSA (obstructive sleep apnea) 07/17/2013  . Scarlet fever      Social History   Social History  . Marital status: Married    Spouse name: N/A  . Number of children: 1  . Years of education: N/A   Occupational History  . Not on file.   Social History Main Topics  . Smoking status: Never Smoker  . Smokeless tobacco: Never Used  . Alcohol use Yes     Comment: Rare  . Drug use: No  . Sexual activity: Not on file   Other Topics Concern  . 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  They have one grown daughter.   Highest level of education:  Master's Degree    Past Surgical History:  Procedure Laterality Date  . APPENDECTOMY  1966  . CARDIAC CATHETERIZATION N/A 03/18/2015   Procedure: Left Heart Cath and Coronary Angiography;  Surgeon: Belva Crome, MD;  Location: [facility];  Service: Cardiovascular;  Laterality: N/A;  . CORONARY ANGIOPLASTY WITH STENT PLACEMENT    . REFRACTIVE SURGERY     Retinopathy  . WISDOM TOOTH EXTRACTION      Family History  Problem Relation Age of Onset  . Hypertension Mother 14    Deceased  . Other Mother     s/p PPM  . Vascular Disease Father 99    Deceased  . Transient ischemic attack Father   . Liver cancer Paternal Grandfather   . Breast cancer Sister   . Heart disease Sister     Ablation for rhythm disturbance  . Healthy Brother     x1    Allergies  Allergen Reactions  . Contrast Media [Iodinated Diagnostic Agents] Other (See Comments)    Renal Failure  . Metrizamide Other (See  Comments)    Renal Failure  . Lisinopril Cough    Current Outpatient Prescriptions on File Prior to Visit  Medication Sig Dispense Refill  . aspirin 81 MG tablet Take 81 mg by mouth daily.    Marland Kitchen atropine 1 % ophthalmic solution Place 1 drop into the right eye 2 (two) times daily.     . brimonidine-timolol (COMBIGAN) 0.2-0.5 % ophthalmic solution Place 1 drop into the left eye every 12 (twelve) hours.     . carvedilol (COREG) 3.125 MG tablet Take 1 tablet (3.125 mg total) by mouth 2 (two) times daily with a meal. 30 tablet 0  . ciprofloxacin (CILOXAN) 0.3 % ophthalmic solution Place 1 drop into the right eye 3 (three) times daily.  Administer 1 drop, every 2 hours, while awake, for 2 days. Then 1 drop, every 4 hours, while awake, for the next 5 days.    . clopidogrel (PLAVIX) 75 MG tablet Take 1 tablet (75 mg total) by mouth daily. 90 tablet 3  . co-enzyme Q-10 30 MG capsule Take 30 mg by mouth daily.     . Difluprednate (DUREZOL) 0.05 % EMUL Place 1 drop into the right eye 3 (three) times daily.    . furosemide (LASIX) 20 MG tablet Take 2 tablets (40 mg total) by mouth 2 (two) times daily. 60 tablet 0  . gabapentin (NEURONTIN) 600 MG tablet TAKE 1 TABLET BY MOUTH 3 TIMES DAILY. 90 tablet 2  . glucagon 1 MG injection Inject 1 mg into the vein once as needed (Emergency Test Kit). Reported on 04/07/2015    . HYDROcodone-acetaminophen (NORCO/VICODIN) 5-325 MG tablet Take 1 tablet by mouth every 6 (six) hours as needed for severe pain. 60 tablet 0  . insulin lispro (HUMALOG) 100 UNIT/ML injection Inject into the skin as directed. Use in Insulin Pump, Max daily dose: 100 units/day    . isosorbide mononitrate (IMDUR) 60 MG 24 hr tablet Take 1 tablet (60 mg total) by mouth 2 (two) times daily. 60 tablet 0  . latanoprost (XALATAN) 0.005 % ophthalmic solution Place 1 drop into the left eye at bedtime.     . midodrine (PROAMATINE) 10 MG tablet Take 1 tablet (10 mg total) by mouth 3 (three) times daily.    .  potassium chloride SA (K-DUR,KLOR-CON) 20 MEQ tablet Take 1 tablet (20 mEq total) by mouth daily. 30 tablet 6  . ranitidine (ZANTAC) 150 MG tablet TAKE 1 TABLET BY MOUTH TWICE DAILY (Patient taking differently: TAKE 150 mg BY MOUTH TWICE DAILY) 60 tablet 5  . ranolazine (RANEXA) 1000 MG SR tablet Take 1 tablet (1,000 mg total) by mouth 2 (two) times daily. 60 tablet 0  . rosuvastatin (CRESTOR) 40 MG tablet Take 40 mg by mouth daily.    . SENEXON-S 8.6-50 MG tablet TAKE 1 TABLET BY MOUTH DAILY. (Patient taking differently: TAKE 1 TABLET BY MOUTH TWICE DAILY) 100 tablet 1   No current facility-administered medications on file prior to visit.     BP (!) 94/58 (BP Location: Right Arm, Patient Position: Sitting, Cuff Size: Large)   Pulse 71   Temp 98.2 F (36.8 C) (Oral)   Resp 16   Ht _0  (1.676 m)   Wt 194 lb (88 kg)   SpO2 95%   BMI 31.31 kg/m       Objective:   Physical Exam  General Mental Status- Alert. General Appearance- Not in acute distress.   Skin General: Color- Normal Color. Moisture- Normal Moisture.  Neck Carotid Arteries- Normal color. Moisture- Normal Moisture. No carotid bruits. No JVD.  Chest and Lung Exam Auscultation: Breath Sounds:-Normal.  Cardiovascular Auscultation:Rythm- Regular. Murmurs & Other Heart Sounds:Auscultation of the heart reveals- No Murmurs.  Abdomen Inspection:-Inspeection Normal. Palpation/Percussion:Note:No mass. Palpation and Percussion of the abdomen reveal- Non Tender, Non Distended + BS, no rebound or guarding.   Neurologic Cranial Nerve exam:- CN III-XII intact(No nystagmus), symmetric smile. Strength:- 5/5 equal and symmetric strength both upper and lower extremities.  Lower ext-no pedal edema       Assessment & Plan:  [phone].  We need to do cxr and labs to assess your current condition post hospitalization for your serious conditions.   Stat cxr and labs.  When results are back will  let you know. If any  emergency values will advise ED evaluation.  After labs back will need to talk to cardiologist office and ask how they want to adjust your meds to help increase your blood pressure. But getting labs first would initial step.  I did go ahead and put in palliative care referral. Anderson Malta is contact person in our office if no one calls you.   Will see about writing order for gel pad.    Follow up with cardiologist this Friday.  45 minutes spent with pt. 50% at least spent counseling pt on varius severe diagnosis and how we would approach treatment in future and work up now. Discussed than may need to be seen in ED depending on lab resuls and how he does clinically

## 2016-05-09 NOTE — Telephone Encounter (Signed)
[facility] @ [phone]) asking the pt to RTC regarding note below.//AB/CMA

## 2016-05-09 NOTE — ED Provider Notes (Signed)
[facility] DEPT [facility] Provider Note   CSN: [id number] Arrival date & time: 05/09/16  1801  By signing my name below, I, Dora Sims, attest that this documentation has been prepared under the direction and in the presence of physician practitioner, Merrily Pew, MD. Electronically Signed: Dora Sims, Scribe. 05/09/2016. 6:28 PM.  History   Chief Complaint Chief Complaint  Patient presents with  . Abnormal Lab    The history is provided by the patient. No language interpreter was used.    HPI Comments: Timothy Alvarez is a 61 y.o. male with PMHx including CHF, CAD, DM, HLD, and multiple MI who presents to the Emergency Department after an abnormal lab reading today. Patient was evaluated by his PCP this afternoon and was informed that his troponin was elevated; he was advised to go to the ED. Patient was discharged from the hospital on 2/22 after admission for CHF. Patient had an EF of 35% in the hospital and had a total of 15 liters of fluid removed during his stay. He weighed 191 lbs on the day of his discharge and was 193 lbs at his PCP's office today. Patient reports he took 40 mg Lasix every morning and 20 mg every night prior to his hospital admission and his dosage was changed to 40 mg BID. Wife reports patient has been short of breath with any degree of exertion and with prolonged speech since pt's discharge; she notes pt cannot ambulate more than 3 steps without becoming severely SOB. Wife also notes patient has had trouble balancing, fatigue/generalized weakness, a decreased appetite, and difficulty urinating since his discharge from the hospital. Wife reports patient has had some skin discoloration and was "pale gray" this morning but notes pt's normal color has returned. Wife additionally reports that patient's blood sugar has been low over the last several days with readings in the 30's upon waking in the morning. He endorses some abdominal distention currently and states his  abdomen feels "tight". Pt has been taking all of his regular medications as prescribed. No recent falls. Patient denies leg swelling, abdominal pain, chest pain, or any other associated symptoms.  PCP: Mackie Pai, PA-C Cardiologist: Dr. Stanford Breed  Past Medical History:  Diagnosis Date  . CAD (coronary artery disease)    a. 03/2015: cath showing severe diffuse CAD along LAD, LCx, and distal RCA. Patient declined CABG with medical management recommended.  . Cataracts, bilateral   . CHF (congestive heart failure) ([place])   . Chicken pox   . Contrast dye induced nephropathy   . Diabetes type 1, uncontrolled ([place])   . Diabetic retinopathy ([place])   . Frequent headaches   . Heart attack 09.13.2014   x5  . Hyperlipidemia   . Measles, [place] (rubella)   . Mumps   . Orthostatic hypotension   . OSA (obstructive sleep apnea) 07/17/2013  . Scarlet fever     Patient Active Problem List   Diagnosis Date Noted  . Elevated troponin 05/05/2016  . Acute kidney injury superimposed on chronic kidney disease ([place]) 05/05/2016  . Hypokalemia 05/05/2016  . Acute on chronic combined systolic and diastolic CHF (congestive heart failure) ([place])   . Fluid overload 04/29/2016  . Dyspnea 04/29/2016  . Influenza A 03/30/2016  . Essential hypertension 02/13/2016  . Foot ulcer, left ([place]) 11/06/2015  . Disability examination 07/26/2015  . Edema 07/01/2015  . Hematuria 04/30/2015  . Gastroesophageal reflux disease without esophagitis 04/22/2015  . NSTEMI (non-ST elevated myocardial infarction) ([place]) 03/18/2015  . Severe comorbid  illness   . SOB (shortness of breath) 08/12/2014  . Fatigue 02/03/2014  . OSA (obstructive sleep apnea) 07/17/2013  . Bruit 05/08/2013  . 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 Chronic pain syndrome 04/07/2013  . CAD S/P percutaneous coronary angioplasty 04/07/2013  . Neuropathy ([place]) 04/07/2013  . Orthostatic hypotension 04/07/2013  . Type 2 diabetes mellitus with  renal manifestations ([place]) 04/07/2013  . Depression 04/07/2013  . Insomnia 04/07/2013  . Hyperlipidemia LDL goal <70 04/07/2013  . Encounter to establish care 04/07/2013    Past Surgical History:  Procedure Laterality Date  . APPENDECTOMY  1966  . CARDIAC CATHETERIZATION N/A 03/18/2015   Procedure: Left Heart Cath and Coronary Angiography;  Surgeon: Belva Crome, MD;  Location: [facility];  Service: Cardiovascular;  Laterality: N/A;  . CORONARY ANGIOPLASTY WITH STENT PLACEMENT    . REFRACTIVE SURGERY     Retinopathy  . WISDOM TOOTH EXTRACTION         Home Medications    Prior to Admission medications   Medication Sig Start Date End Date Taking? Authorizing Provider  aspirin 81 MG tablet Take 81 mg by mouth daily.    Historical Provider, MD  atropine 1 % ophthalmic solution Place 1 drop into the right eye 2 (two) times daily.     Historical Provider, MD  brimonidine-timolol (COMBIGAN) 0.2-0.5 % ophthalmic solution Place 1 drop into the left eye every 12 (twelve) hours.     Historical Provider, MD  carvedilol (COREG) 3.125 MG tablet Take 1 tablet (3.125 mg total) by mouth 2 (two) times daily with a meal. 05/05/16   Domenic Polite, MD  ciprofloxacin (CILOXAN) 0.3 % ophthalmic solution Place 1 drop into the right eye 3 (three) times daily. Administer 1 drop, every 2 hours, while awake, for 2 days. Then 1 drop, every 4 hours, while awake, for the next 5 days.    Historical Provider, MD  clopidogrel (PLAVIX) 75 MG tablet Take 1 tablet (75 mg total) by mouth daily. 03/30/16   Erma Heritage, PA  co-enzyme Q-10 30 MG capsule Take 30 mg by mouth daily.     Historical Provider, MD  Difluprednate (DUREZOL) 0.05 % EMUL Place 1 drop into the right eye 3 (three) times daily.    Historical Provider, MD  furosemide (LASIX) 20 MG tablet Take 2 tablets (40 mg total) by mouth 2 (two) times daily. 05/05/16   Domenic Polite, MD  gabapentin (NEURONTIN) 600 MG tablet TAKE 1 TABLET BY MOUTH 3 TIMES  DAILY. 04/18/16   Percell Miller Saguier, PA-C  glucagon 1 MG injection Inject 1 mg into the vein once as needed (Emergency Test Kit). Reported on 04/07/2015    Historical Provider, MD  HYDROcodone-acetaminophen (NORCO) 5-325 MG tablet Take 1 tablet by mouth every 6 (six) hours as needed for moderate pain. 05/09/16   Mackie Pai, PA-C  HYDROcodone-acetaminophen (NORCO/VICODIN) 5-325 MG tablet Take 1 tablet by mouth every 6 (six) hours as needed for severe pain. 04/22/15   Brunetta Jeans, PA-C  insulin lispro (HUMALOG) 100 UNIT/ML injection Inject into the skin as directed. Use in Insulin Pump, Max daily dose: 100 units/day    Historical Provider, MD  isosorbide mononitrate (IMDUR) 60 MG 24 hr tablet Take 1 tablet (60 mg total) by mouth 2 (two) times daily. 05/05/16   Domenic Polite, MD  latanoprost (XALATAN) 0.005 % ophthalmic solution Place 1 drop into the left eye at bedtime.     Historical Provider, MD  midodrine (PROAMATINE) 10 MG  tablet Take 1 tablet (10 mg total) by mouth 3 (three) times daily. 05/05/16   Domenic Polite, MD  nitroGLYCERIN (NITROSTAT) 0.4 MG SL tablet Place 1 tablet (0.4 mg total) under the tongue every 5 (five) minutes x 3 doses as needed for chest pain. 05/09/16   Percell Miller Saguier, PA-C  potassium chloride SA (K-DUR,KLOR-CON) 20 MEQ tablet Take 1 tablet (20 mEq total) by mouth daily. 03/31/16   Erma Heritage, PA  ranitidine (ZANTAC) 150 MG tablet TAKE 1 TABLET BY MOUTH TWICE DAILY Patient taking differently: TAKE 150 mg BY MOUTH TWICE DAILY 12/09/15   Brunetta Jeans, PA-C  ranolazine (RANEXA) 1000 MG SR tablet Take 1 tablet (1,000 mg total) by mouth 2 (two) times daily. 05/05/16   Domenic Polite, MD  rosuvastatin (CRESTOR) 40 MG tablet Take 40 mg by mouth daily. 01/28/16   Historical Provider, MD  SENEXON-S 8.6-50 MG tablet TAKE 1 TABLET BY MOUTH DAILY. Patient taking differently: TAKE 1 TABLET BY MOUTH TWICE DAILY 02/13/15   Brunetta Jeans, PA-C    Family History Family History    Problem Relation Age of Onset  . Hypertension Mother 25    Deceased  . Other Mother     s/p PPM  . Vascular Disease Father 91    Deceased  . Transient ischemic attack Father   . Liver cancer Paternal Grandfather   . Breast cancer Sister   . Heart disease Sister     Ablation for rhythm disturbance  . Healthy Brother     x1    Social History Social History  Substance Use Topics  . Smoking status: Never Smoker  . Smokeless tobacco: Never Used  . Alcohol use Yes     Comment: Rare     Allergies   Contrast media [iodinated diagnostic agents]; Metrizamide; and Lisinopril   Review of Systems Review of Systems  Constitutional: Positive for appetite change (decreased) and fatigue.  Respiratory: Positive for shortness of breath.   Cardiovascular: Negative for chest pain and leg swelling.  Gastrointestinal: Positive for abdominal distention. Negative for abdominal pain.  Genitourinary: Positive for difficulty urinating.  Musculoskeletal: Positive for gait problem.  Skin: Positive for color change.  Neurological: Positive for weakness.  All other systems reviewed and are negative.    Physical Exam Updated Vital Signs BP 116/73   Pulse 70   Temp 97.7 F (36.5 C) (Oral)   Resp 15   Ht _0  (1.676 m)   Wt 188 lb (85.3 kg)   SpO2 95%   BMI 30.34 kg/m   Physical Exam  Constitutional: He is oriented to person, place, and time. He appears well-developed and well-nourished. No distress.  HENT:  Head: Normocephalic and atraumatic.  Eyes: Conjunctivae and EOM are normal.  Neck: Neck supple. JVD present. No tracheal deviation present.  Cardiovascular: Normal rate and regular rhythm.   No S3.  Pulmonary/Chest: Effort normal. Tachypnea noted. No respiratory distress. He has no wheezes.  Crackles in the bases.  Abdominal: He exhibits distension. He exhibits no mass. There is no rebound and no guarding.  Abdomen is distended. No TTP.  Musculoskeletal: Normal range of  motion. He exhibits no edema.  No lower extremity edema.  Neurological: He is alert and oriented to person, place, and time.  Skin: Skin is warm and dry.  Psychiatric: He has a normal mood and affect. His behavior is normal.  Nursing note and vitals reviewed.    ED Treatments / Results  Labs (all labs ordered are  listed, but only abnormal results are displayed) Labs Reviewed  TROPONIN I - Abnormal; Notable for the following:       Result Value   Troponin I 0.23 (*)    All other components within normal limits  URINALYSIS, ROUTINE W REFLEX MICROSCOPIC    EKG  EKG Interpretation None       Radiology Dg Chest 2 View  Result Date: 05/09/2016 CLINICAL DATA:  CHF EXAM: CHEST  2 VIEW COMPARISON:  05/02/2016.  04/29/2016. FINDINGS: The lungs are clear wiithout focal pneumonia, edema, pneumothorax or pleural effusion. Subsegmental atelectasis and/ or scarring noted in both lung bases. The cardiopericardial silhouette is within normal limits for size. The visualized bony structures of the thorax are intact. IMPRESSION: No active cardiopulmonary disease. Electronically Signed   By: Misty Stanley M.D.   On: 05/09/2016 14:48    Procedures Procedures (including critical care time)  DIAGNOSTIC STUDIES: Oxygen Saturation is 100% on RA, normal by my interpretation.    COORDINATION OF CARE: 6:47 PM Discussed treatment plan with pt at bedside and pt agreed to plan.  Medications Ordered in ED Medications  furosemide (LASIX) injection 80 mg (80 mg Intravenous Given 05/09/16 1848)     Initial Impression / Assessment and Plan / ED Course  I have reviewed the triage vital signs and the nursing notes.  Pertinent labs & imaging results that were available during my care of the patient were reviewed by me and considered in my medical decision making (see chart for details).     Sent here to rule out worsening heart failure. Patient with significant weakness and shortness of breath  exertional chest think is more related to deconditioning and heart failure. His BNP is much improved compared to previous heart failure exacerbations. He hasn't gained any weight since that time. He also hasn't improved troponin since his recent admission as well. Discussed hospitalization versus discharge home with out patient resources and they agreed that being treated at home was more appropriate for the situation. A give a IV dose of Lasix to see if there was any extra fluid to be diuresed with minimal response confirming that is not likely to be fluid overloaded. He'll follow-up with his primary doctor as needed and I put in a consult for care management and home health orders to try to get [facility] back out to the house for heart failure management, outpatient physical therapy and occupational therapy.  Final Clinical Impressions(s) / ED Diagnoses   Final diagnoses:  Weakness    New Prescriptions Discharge Medication List as of 05/09/2016 10:51 PM     I personally performed the services described in this documentation, which was scribed in my presence. The recorded information has been reviewed and is accurate.    Merrily Pew, MD 05/10/16 [phone]

## 2016-05-09 NOTE — Progress Notes (Signed)
Pre visit review using our clinic review tool, if applicable. No additional management support is needed unless otherwise documented below in the visit note/SLS  

## 2016-05-09 NOTE — ED Notes (Signed)
Denies chest pain or fevers. Reports recent admission last week. Today being seen at PCP for follow up from the admission. Pt states "I am always short of breath"   Reports blood sugars have been normal today at home.

## 2016-05-09 NOTE — Telephone Encounter (Signed)
Spoke with the pt regarding Statement of Medical Necessity received from [facility].  Pt stated this was regarding a hospital bed that he was needing, but it has already been taking care of by his Cardiologist.  He stated that he does not know [facility].  Forms was discarded.//AB/CMA

## 2016-05-09 NOTE — Patient Instructions (Addendum)
  We need to do cxr and labs to assess your current condition post hospitalization for your serious conditions.   Stat cxr and labs.  When results are back will let you know. If any emergency values will advise ED evaluation.  After labs back will need to talk to cardiologist office and ask how they want to adjust your meds to help increase your blood pressure. But getting labs first would initial step.  I did go ahead and put in palliative care referral. Victorino DikeJennifer is contact person in our office if no one calls you.   Will see about writing order for gel pad.    Follow up with cardiologist this Friday.  After reviewing labs and troponin elevation talked with pt wife and  advised pt to go to ED down stairs. I talked with MD downstairs and notified him of pt presentation and recent symptoms.    Explained to wife importance of going now and that getting another troponin level would be critical in helping determine course of action.

## 2016-05-09 NOTE — Telephone Encounter (Signed)
Patient has Hospital F/U scheduled today at 1:00pm, provider aware/SLS 02/26

## 2016-05-09 NOTE — ED Triage Notes (Signed)
[facility]. He developed pneumonia while in the hospital. Here today after his MD called him and told him his Troponin was elevated. He is SOB.

## 2016-05-09 NOTE — Telephone Encounter (Signed)
CRITICAL VALUE   KRISTY PRICE  05-09-2016  [facility] [phone]  [place]  [phone]

## 2016-05-11 ENCOUNTER — Telehealth: Payer: Self-pay | Admitting: *Deleted

## 2016-05-11 NOTE — Telephone Encounter (Signed)
Reviewed ED note. The other day in ED his bp was decent. Have him check his blood pressure tomorrow 3 times and give reading to cardiologist on Friday as when he saw me his bp was on low side.

## 2016-05-11 NOTE — Telephone Encounter (Signed)
Received signed Orders from Dr. Abner GreenspanBlyth.  Faxed to [facility] @ [phone]) on (05/03/16).  Confirmation received.//AB/CMA

## 2016-05-12 ENCOUNTER — Encounter (HOSPITAL_BASED_OUTPATIENT_CLINIC_OR_DEPARTMENT_OTHER): Payer: Self-pay | Admitting: Emergency Medicine

## 2016-05-12 ENCOUNTER — Emergency Department (HOSPITAL_BASED_OUTPATIENT_CLINIC_OR_DEPARTMENT_OTHER)
Admission: EM | Admit: 2016-05-12 | Discharge: 2016-05-12 | Disposition: A | Discharge status: Home / Self Care | Payer: Medicare HMO | Attending: Emergency Medicine | Admitting: Emergency Medicine

## 2016-05-12 ENCOUNTER — Emergency Department (HOSPITAL_BASED_OUTPATIENT_CLINIC_OR_DEPARTMENT_OTHER): Payer: Medicare HMO

## 2016-05-12 DIAGNOSIS — I5043 Acute on chronic combined systolic (congestive) and diastolic (congestive) heart failure: Secondary | ICD-10-CM | POA: Insufficient documentation

## 2016-05-12 DIAGNOSIS — I2510 Atherosclerotic heart disease of native coronary artery without angina pectoris: Secondary | ICD-10-CM | POA: Insufficient documentation

## 2016-05-12 DIAGNOSIS — E1122 Type 2 diabetes mellitus with diabetic chronic kidney disease: Secondary | ICD-10-CM | POA: Insufficient documentation

## 2016-05-12 DIAGNOSIS — R339 Retention of urine, unspecified: Secondary | ICD-10-CM | POA: Diagnosis not present

## 2016-05-12 DIAGNOSIS — R1032 Left lower quadrant pain: Secondary | ICD-10-CM | POA: Insufficient documentation

## 2016-05-12 DIAGNOSIS — Z794 Long term (current) use of insulin: Secondary | ICD-10-CM | POA: Insufficient documentation

## 2016-05-12 DIAGNOSIS — I130 Hypertensive heart and chronic kidney disease with heart failure and stage 1 through stage 4 chronic kidney disease, or unspecified chronic kidney disease: Secondary | ICD-10-CM | POA: Diagnosis not present

## 2016-05-12 DIAGNOSIS — R110 Nausea: Secondary | ICD-10-CM | POA: Insufficient documentation

## 2016-05-12 DIAGNOSIS — N189 Chronic kidney disease, unspecified: Secondary | ICD-10-CM | POA: Diagnosis not present

## 2016-05-12 DIAGNOSIS — Z79899 Other long term (current) drug therapy: Secondary | ICD-10-CM | POA: Diagnosis not present

## 2016-05-12 DIAGNOSIS — Z7982 Long term (current) use of aspirin: Secondary | ICD-10-CM | POA: Diagnosis not present

## 2016-05-12 DIAGNOSIS — R109 Unspecified abdominal pain: Secondary | ICD-10-CM | POA: Diagnosis not present

## 2016-05-12 LAB — COMPREHENSIVE METABOLIC PANEL
ALBUMIN: 3.7 g/dL (ref 3.5–5.0)
ALT: 17 U/L (ref 17–63)
ANION GAP: 9 (ref 5–15)
AST: 26 U/L (ref 15–41)
Alkaline Phosphatase: 110 U/L (ref 38–126)
BILIRUBIN TOTAL: 0.7 mg/dL (ref 0.3–1.2)
BUN: 44 mg/dL — ABNORMAL HIGH (ref 6–20)
CO2: 29 mmol/L (ref 22–32)
Calcium: 8.9 mg/dL (ref 8.9–10.3)
Chloride: 99 mmol/L — ABNORMAL LOW (ref 101–111)
Creatinine, Ser: 2.24 mg/dL — ABNORMAL HIGH (ref 0.61–1.24)
GFR calc Af Amer: 34 mL/min — ABNORMAL LOW (ref >60)
GFR calc non Af Amer: 30 mL/min — ABNORMAL LOW (ref >60)
GLUCOSE: 130 mg/dL — AB (ref 65–99)
POTASSIUM: 4.9 mmol/L (ref 3.5–5.1)
Sodium: 137 mmol/L (ref 135–145)
TOTAL PROTEIN: 7.3 g/dL (ref 6.5–8.1)

## 2016-05-12 LAB — URINALYSIS, ROUTINE W REFLEX MICROSCOPIC
Bilirubin Urine: NEGATIVE
GLUCOSE, UA: NEGATIVE mg/dL
Hgb urine dipstick: NEGATIVE
Ketones, ur: NEGATIVE mg/dL
LEUKOCYTES UA: NEGATIVE
NITRITE: NEGATIVE
PROTEIN: NEGATIVE mg/dL
Specific Gravity, Urine: 1.012 (ref 1.005–1.030)
pH: 7 (ref 5.0–8.0)

## 2016-05-12 LAB — CBC WITH DIFFERENTIAL/PLATELET
BASOS PCT: 1 %
Basophils Absolute: 0 10*3/uL (ref 0.0–0.1)
Eosinophils Absolute: 0.3 10*3/uL (ref 0.0–0.7)
Eosinophils Relative: 5 %
HCT: 37.7 % — ABNORMAL LOW (ref 39.0–52.0)
Hemoglobin: 12.6 g/dL — ABNORMAL LOW (ref 13.0–17.0)
Lymphocytes Relative: 24 %
Lymphs Abs: 1.3 10*3/uL (ref 0.7–4.0)
MCH: 28.4 pg (ref 26.0–34.0)
MCHC: 33.4 g/dL (ref 30.0–36.0)
MCV: 85.1 fL (ref 78.0–100.0)
MONOS PCT: 10 %
Monocytes Absolute: 0.5 10*3/uL (ref 0.1–1.0)
Neutro Abs: 3.1 10*3/uL (ref 1.7–7.7)
Neutrophils Relative %: 60 %
Platelets: 291 10*3/uL (ref 150–400)
RBC: 4.43 MIL/uL (ref 4.22–5.81)
RDW: 14.5 % (ref 11.5–15.5)
WBC: 5.2 10*3/uL (ref 4.0–10.5)

## 2016-05-12 MED ORDER — SODIUM CHLORIDE 0.9 % IV BOLUS (SEPSIS)
1000.0000 mL | Freq: Once | INTRAVENOUS | Status: AC
  Administered 2016-05-12: 1000 mL via INTRAVENOUS

## 2016-05-12 MED ORDER — ONDANSETRON HCL 4 MG/2ML IJ SOLN
4.0000 mg | Freq: Once | INTRAMUSCULAR | Status: AC
  Administered 2016-05-12: 4 mg via INTRAVENOUS
  Filled 2016-05-12: qty 2

## 2016-05-12 MED ORDER — SODIUM CHLORIDE 0.9 % IV SOLN: INTRAVENOUS | Status: DC

## 2016-05-12 MED ORDER — FENTANYL CITRATE (PF) 100 MCG/2ML IJ SOLN
50.0000 ug | Freq: Once | INTRAMUSCULAR | Status: AC
  Administered 2016-05-12: 50 ug via INTRAVENOUS
  Filled 2016-05-12: qty 2

## 2016-05-12 NOTE — ED Triage Notes (Addendum)
Generalized Weakness, abd pain, urinary retention, lower back pain since this morning. Pt seen here Monday for elevated troponin. He is pale.

## 2016-05-12 NOTE — ED Notes (Signed)
EDP made aware of bolus stopped

## 2016-05-12 NOTE — ED Notes (Signed)
Patient transported to CT 

## 2016-05-12 NOTE — ED Notes (Signed)
Pt on monitor 

## 2016-05-12 NOTE — ED Provider Notes (Signed)
[facility] Provider Note   CSN: [id number] Arrival date & time: 05/12/16  1221     History   Chief Complaint Chief Complaint  Patient presents with  . Weakness  . Urinary Retention    HPI Timothy Alvarez is a 61 y.o. male.  61 year old male presents with left flank pain times several hours. Pain is been persistent status is left flank and radiates to his left groin. He's had nausea but no vomiting. No diarrhea noted. Denies chest pain or chest pressure.  No fever or chills. Does have a history of kidney stones and this may be similar. He's also had some decreased urination as well as dark urine. Denies any syncope or near-syncope.      Past Medical History:  Diagnosis Date  . CAD (coronary artery disease)    a. 03/2015: cath showing severe diffuse CAD along LAD, LCx, and distal RCA. Patient declined CABG with medical management recommended.  . Cataracts, bilateral   . CHF (congestive heart failure) ([place])   . Chicken pox   . Contrast dye induced nephropathy   . Diabetes type 1, uncontrolled ([place])   . Diabetic retinopathy ([place])   . Frequent headaches   . Heart attack 09.13.2014   x5  . Hyperlipidemia   . Measles, [place] (rubella)   . Mumps   . Orthostatic hypotension   . OSA (obstructive sleep apnea) 07/17/2013  . Scarlet fever     Patient Active Problem List   Diagnosis Date Noted  . Elevated troponin 05/05/2016  . Acute kidney injury superimposed on chronic kidney disease ([place]) 05/05/2016  . Hypokalemia 05/05/2016  . Acute on chronic combined systolic and diastolic CHF (congestive heart failure) ([place])   . Fluid overload 04/29/2016  . Dyspnea 04/29/2016  . Influenza A 03/30/2016  . Essential hypertension 02/13/2016  . Foot ulcer, left ([place]) 11/06/2015  . Disability examination 07/26/2015  . Edema 07/01/2015  . Hematuria 04/30/2015  . Gastroesophageal reflux disease without esophagitis 04/22/2015  . NSTEMI (non-ST elevated myocardial infarction)  ([place]) 03/18/2015  . Severe comorbid illness   . SOB (shortness of breath) 08/12/2014  . Fatigue 02/03/2014  . OSA (obstructive sleep apnea) 07/17/2013  . Bruit 05/08/2013  . 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 Chronic pain syndrome 04/07/2013  . CAD S/P percutaneous coronary angioplasty 04/07/2013  . Neuropathy ([place]) 04/07/2013  . Orthostatic hypotension 04/07/2013  . Type 2 diabetes mellitus with renal manifestations ([place]) 04/07/2013  . Depression 04/07/2013  . Insomnia 04/07/2013  . Hyperlipidemia LDL goal <70 04/07/2013  . Encounter to establish care 04/07/2013    Past Surgical History:  Procedure Laterality Date  . APPENDECTOMY  1966  . CARDIAC CATHETERIZATION N/A 03/18/2015   Procedure: Left Heart Cath and Coronary Angiography;  Surgeon: Belva Crome, MD;  Location: [facility];  Service: Cardiovascular;  Laterality: N/A;  . CORONARY ANGIOPLASTY WITH STENT PLACEMENT    . REFRACTIVE SURGERY     Retinopathy  . WISDOM TOOTH EXTRACTION         Home Medications    Prior to Admission medications   Medication Sig Start Date End Date Taking? Authorizing Provider  aspirin 81 MG tablet Take 81 mg by mouth daily.    Historical Provider, MD  atropine 1 % ophthalmic solution Place 1 drop into the right eye 2 (two) times daily.     Historical Provider, MD  brimonidine-timolol (COMBIGAN) 0.2-0.5 % ophthalmic solution Place 1 drop into the left eye every 12 (twelve)  hours.     Historical Provider, MD  carvedilol (COREG) 3.125 MG tablet Take 1 tablet (3.125 mg total) by mouth 2 (two) times daily with a meal. 05/05/16   Domenic Polite, MD  ciprofloxacin (CILOXAN) 0.3 % ophthalmic solution Place 1 drop into the right eye 3 (three) times daily. Administer 1 drop, every 2 hours, while awake, for 2 days. Then 1 drop, every 4 hours, while awake, for the next 5 days.    Historical Provider, MD  clopidogrel (PLAVIX) 75 MG tablet Take 1 tablet (75 mg total) by mouth daily. 03/30/16    Erma Heritage, PA  co-enzyme Q-10 30 MG capsule Take 30 mg by mouth daily.     Historical Provider, MD  Difluprednate (DUREZOL) 0.05 % EMUL Place 1 drop into the right eye 3 (three) times daily.    Historical Provider, MD  furosemide (LASIX) 20 MG tablet Take 2 tablets (40 mg total) by mouth 2 (two) times daily. 05/05/16   Domenic Polite, MD  gabapentin (NEURONTIN) 600 MG tablet TAKE 1 TABLET BY MOUTH 3 TIMES DAILY. 04/18/16   Percell Miller Saguier, PA-C  glucagon 1 MG injection Inject 1 mg into the vein once as needed (Emergency Test Kit). Reported on 04/07/2015    Historical Provider, MD  HYDROcodone-acetaminophen (NORCO) 5-325 MG tablet Take 1 tablet by mouth every 6 (six) hours as needed for moderate pain. 05/09/16   Mackie Pai, PA-C  HYDROcodone-acetaminophen (NORCO/VICODIN) 5-325 MG tablet Take 1 tablet by mouth every 6 (six) hours as needed for severe pain. 04/22/15   Brunetta Jeans, PA-C  insulin lispro (HUMALOG) 100 UNIT/ML injection Inject into the skin as directed. Use in Insulin Pump, Max daily dose: 100 units/day    Historical Provider, MD  isosorbide mononitrate (IMDUR) 60 MG 24 hr tablet Take 1 tablet (60 mg total) by mouth 2 (two) times daily. 05/05/16   Domenic Polite, MD  latanoprost (XALATAN) 0.005 % ophthalmic solution Place 1 drop into the left eye at bedtime.     Historical Provider, MD  midodrine (PROAMATINE) 10 MG tablet Take 1 tablet (10 mg total) by mouth 3 (three) times daily. 05/05/16   Domenic Polite, MD  nitroGLYCERIN (NITROSTAT) 0.4 MG SL tablet Place 1 tablet (0.4 mg total) under the tongue every 5 (five) minutes x 3 doses as needed for chest pain. 05/09/16   Percell Miller Saguier, PA-C  potassium chloride SA (K-DUR,KLOR-CON) 20 MEQ tablet Take 1 tablet (20 mEq total) by mouth daily. 03/31/16   Erma Heritage, PA  ranitidine (ZANTAC) 150 MG tablet TAKE 1 TABLET BY MOUTH TWICE DAILY Patient taking differently: TAKE 150 mg BY MOUTH TWICE DAILY 12/09/15   Brunetta Jeans, PA-C    ranolazine (RANEXA) 1000 MG SR tablet Take 1 tablet (1,000 mg total) by mouth 2 (two) times daily. 05/05/16   Domenic Polite, MD  rosuvastatin (CRESTOR) 40 MG tablet Take 40 mg by mouth daily. 01/28/16   Historical Provider, MD  SENEXON-S 8.6-50 MG tablet TAKE 1 TABLET BY MOUTH DAILY. Patient taking differently: TAKE 1 TABLET BY MOUTH TWICE DAILY 02/13/15   Brunetta Jeans, PA-C    Family History Family History  Problem Relation Age of Onset  . Hypertension Mother 63    Deceased  . Other Mother     s/p PPM  . Vascular Disease Father 58    Deceased  . Transient ischemic attack Father   . Liver cancer Paternal Grandfather   . Breast cancer Sister   . Heart disease  Sister     Ablation for rhythm disturbance  . Healthy Brother     x1    Social History Social History  Substance Use Topics  . Smoking status: Never Smoker  . Smokeless tobacco: Never Used  . Alcohol use Yes     Comment: Rare     Allergies   Contrast media [iodinated diagnostic agents]; Metrizamide; and Lisinopril   Review of Systems Review of Systems  All other systems reviewed and are negative.    Physical Exam Updated Vital Signs BP 121/78   Pulse 63   Temp 97.8 F (36.6 C) (Oral)   Resp 18   Ht 5' 6" (1.676 m)   Wt 83.9 kg   SpO2 100%   BMI 29.86 kg/m   Physical Exam  Constitutional: He is oriented to person, place, and time. He appears well-developed and well-nourished.  Non-toxic appearance. No distress.  HENT:  Head: Normocephalic and atraumatic.  Eyes: Conjunctivae, EOM and lids are normal. Pupils are equal, round, and reactive to light.  Neck: Normal range of motion. Neck supple. No tracheal deviation present. No thyroid mass present.  Cardiovascular: Normal rate, regular rhythm and normal heart sounds.  Exam reveals no gallop.   No murmur heard. Pulmonary/Chest: Effort normal and breath sounds normal. No stridor. No respiratory distress. He has no decreased breath sounds. He has no  wheezes. He has no rhonchi. He has no rales.  Abdominal: Soft. Normal appearance and bowel sounds are normal. He exhibits no distension. There is tenderness in the left lower quadrant. There is no rebound and no CVA tenderness.    Musculoskeletal: Normal range of motion. He exhibits no edema or tenderness.  Neurological: He is alert and oriented to person, place, and time. He has normal strength. No cranial nerve deficit or sensory deficit. GCS eye subscore is 4. GCS verbal subscore is 5. GCS motor subscore is 6.  Skin: Skin is warm and dry. No abrasion and no rash noted.  Psychiatric: He has a normal mood and affect. His speech is normal and behavior is normal.  Nursing note and vitals reviewed.    ED Treatments / Results  Labs (all labs ordered are listed, but only abnormal results are displayed) Labs Reviewed  URINE CULTURE  CBC WITH DIFFERENTIAL/PLATELET  COMPREHENSIVE METABOLIC PANEL  TROPONIN I  URINALYSIS, ROUTINE W REFLEX MICROSCOPIC    EKG  EKG Interpretation None       Radiology No results found.  Procedures Procedures (including critical care time)  Medications Ordered in ED Medications  0.9 %  sodium chloride infusion (not administered)  0.9 %  sodium chloride infusion (not administered)  sodium chloride 0.9 % bolus 1,000 mL (not administered)  fentaNYL (SUBLIMAZE) injection 50 mcg (not administered)  ondansetron (ZOFRAN) injection 4 mg (not administered)     Initial Impression / Assessment and Plan / ED Course  I have reviewed the triage vital signs and the nursing notes.  Pertinent labs & imaging results that were available during my care of the patient were reviewed by me and considered in my medical decision making (see chart for details).     Foley catheter placed and patient feels better. CT scan did show hydronephrosis likely from bladder outlet obstruction. His urinalysis is negative. Creatinine is stable. We discharged home to follow-up with  his urologist.  Final Clinical Impressions(s) / ED Diagnoses   Final diagnoses:  None    New Prescriptions New Prescriptions   No medications on file        , MD 05/12/16 1507  

## 2016-05-12 NOTE — Telephone Encounter (Signed)
Please contact different service.

## 2016-05-12 NOTE — Telephone Encounter (Signed)
Called patient.  Ms. Timothy Alvarez stated he has been really sick lately and that she would take message.  Discussed provider's recommendations.  She stated understanding and agreed with plan.  No questions or concerns voiced at this time.

## 2016-05-12 NOTE — Telephone Encounter (Signed)
That is ok 

## 2016-05-13 ENCOUNTER — Ambulatory Visit: Payer: Medicare HMO | Admitting: Student

## 2016-05-13 LAB — URINE CULTURE: CULTURE: NO GROWTH

## 2016-05-15 NOTE — Progress Notes (Signed)
Cardiology Office Note    Date:  05/16/2016   ID:  GAJE TENNYSON, DOB [birth year], MRN [id number]  PCP:  Mackie Pai, PA-C  Cardiologist: Dr. Stanford Breed  Chief Complaint  Patient presents with  . Hospitalization Follow-up    History of Present Illness:    Timothy Alvarez is a 61 y.o. male with past medical history of multivessel CAD (severe diffuse CAD by cath in 03/2015, declined CABG in the past), IDDM, chronic combined systolic and diastolic CHF (EF 40-98% by echo in 03/2015, now at 30-35% by echo in 04/2016), HTN, and HLD who presents to the office today for hospital follow-up.   Was recently admitted from 2/16 - 05/05/2016 for acute CHF exacerbation. His EF was previously though to be preserved with a repeat echo during admission showing an EF of 30% to 35%, Grade 2 DD, and severe hypokinesis of the apical anterior, apical septal, apical lateral, and apical myocardium. He was diuresed with IV Lasix and required the addition of Metolazone with this. Overall, he diuresed -8.5L with weight of 195 lbs at time of discharge. Placed on Lasix 41m BID along with Coreg 3.1273mBID and Ranexa 100075mID. Not restarted on ACE-I/ARB secondary to AKI.  Troponin values were flat at 0.51 and 0.49 (similar to prior lab values in 03/2016). Was also found to have a focal PNA and was treated with Levaquin.   Seen by PCP on 2/26 with labs at that time showing creatinine of 2.07 (at 1.85 the day of hospital discharge), BNP 228, and Troponin of 0.27. Was sent to the ER for elevated troponin and BNP. Troponin was repeated with a value of 0.23. Was given a dose of IV Lasix and discharged home with Home Health management, PT, and OT.   Was seen back in the ER on 05/12/2016 for generalized weakness and urinary retention. Reported having left flank pain radiating to his groin. WBC normal at 5.2. Creatinine elevated at 2.24, K+ 4.9. UA with no acute findings. CT Imaging showed mild bilateral hydronephrosis which was  thought to be secondary to bladder outlet obstruction. Was informed to follow-up with Urology as an outpatient.   In talking with the patient today, he reports significant improvement in his symptoms since his recent ER visit. A Foley catheter was placed at that time and his wife reports he has had good urinary output since then. Has follow-up scheduled with Urology for next week. Dyspnea has significantly improved but remains an issue. No orthopnea or lower extremity edema. He denies any recent chest discomfort or palpitations.  The patient and his wife are interested in pursuing hospice services. He was recently evaluated by Palliative Care and deemed a candidate for hospice. He reports being "tired" of going to the hospital and wishes to focus more on comfort.    Past Medical History:  Diagnosis Date  . CAD (coronary artery disease)    a. 03/2015: cath showing severe diffuse CAD along LAD, LCx, and distal RCA. Patient declined CABG with medical management recommended.  . Cataracts, bilateral   . CHF (congestive heart failure) ([facility] . Chicken pox   . Contrast dye induced nephropathy   . Diabetes type 1, uncontrolled ([facility] [place] . Diabetic retinopathy ([facility] . Frequent headaches   . Heart attack 09.13.2014   x5  . Hyperlipidemia   . Measles, Ger[place]ubella)   . Mumps   . Orthostatic hypotension   . OSA (obstructive sleep apnea) 07/17/2013  . Scarlet fever  Past Surgical History:  Procedure Laterality Date  . APPENDECTOMY  1966  . CARDIAC CATHETERIZATION N/A 03/18/2015   Procedure: Left Heart Cath and Coronary Angiography;  Surgeon: Belva Crome, MD;  Location: [facility];  Service: Cardiovascular;  Laterality: N/A;  . CORONARY ANGIOPLASTY WITH STENT PLACEMENT    . REFRACTIVE SURGERY     Retinopathy  . WISDOM TOOTH EXTRACTION      Current Medications: Outpatient Medications Prior to Visit  Medication Sig Dispense Refill  . aspirin 81 MG tablet Take 81 mg by mouth  daily.    Marland Kitchen atropine 1 % ophthalmic solution Place 1 drop into the right eye 2 (two) times daily.     . brimonidine-timolol (COMBIGAN) 0.2-0.5 % ophthalmic solution Place 1 drop into the left eye every 12 (twelve) hours.     . ciprofloxacin (CILOXAN) 0.3 % ophthalmic solution Place 1 drop into the right eye 3 (three) times daily. Administer 1 drop, every 2 hours, while awake, for 2 days. Then 1 drop, every 4 hours, while awake, for the next 5 days.    . clopidogrel (PLAVIX) 75 MG tablet Take 1 tablet (75 mg total) by mouth daily. 90 tablet 3  . co-enzyme Q-10 30 MG capsule Take 30 mg by mouth daily.     . Difluprednate (DUREZOL) 0.05 % EMUL Place 1 drop into the right eye 3 (three) times daily.    . furosemide (LASIX) 20 MG tablet Take 2 tablets (40 mg total) by mouth 2 (two) times daily. 60 tablet 0  . gabapentin (NEURONTIN) 600 MG tablet TAKE 1 TABLET BY MOUTH 3 TIMES DAILY. 90 tablet 2  . glucagon 1 MG injection Inject 1 mg into the vein once as needed (Emergency Test Kit). Reported on 04/07/2015    . HYDROcodone-acetaminophen (NORCO) 5-325 MG tablet Take 1 tablet by mouth every 6 (six) hours as needed for moderate pain. 30 tablet 0  . insulin lispro (HUMALOG) 100 UNIT/ML injection Inject into the skin as directed. Use in Insulin Pump, Max daily dose: 100 units/day    . isosorbide mononitrate (IMDUR) 60 MG 24 hr tablet Take 1 tablet (60 mg total) by mouth 2 (two) times daily. 60 tablet 0  . latanoprost (XALATAN) 0.005 % ophthalmic solution Place 1 drop into the left eye at bedtime.     . midodrine (PROAMATINE) 10 MG tablet Take 1 tablet (10 mg total) by mouth 3 (three) times daily.    . nitroGLYCERIN (NITROSTAT) 0.4 MG SL tablet Place 1 tablet (0.4 mg total) under the tongue every 5 (five) minutes x 3 doses as needed for chest pain. 25 tablet 12  . potassium chloride SA (K-DUR,KLOR-CON) 20 MEQ tablet Take 1 tablet (20 mEq total) by mouth daily. 30 tablet 6  . ranitidine (ZANTAC) 150 MG tablet TAKE  1 TABLET BY MOUTH TWICE DAILY (Patient taking differently: TAKE 150 mg BY MOUTH TWICE DAILY) 60 tablet 5  . ranolazine (RANEXA) 1000 MG SR tablet Take 1 tablet (1,000 mg total) by mouth 2 (two) times daily. 60 tablet 0  . rosuvastatin (CRESTOR) 40 MG tablet Take 40 mg by mouth daily.    . SENEXON-S 8.6-50 MG tablet TAKE 1 TABLET BY MOUTH DAILY. (Patient taking differently: TAKE 1 TABLET BY MOUTH TWICE DAILY) 100 tablet 1  . carvedilol (COREG) 3.125 MG tablet Take 1 tablet (3.125 mg total) by mouth 2 (two) times daily with a meal. 30 tablet 0  . HYDROcodone-acetaminophen (NORCO/VICODIN) 5-325 MG tablet Take 1 tablet  by mouth every 6 (six) hours as needed for severe pain. (Patient not taking: Reported on 05/16/2016) 60 tablet 0   No facility-administered medications prior to visit.      Allergies:   Contrast media [iodinated diagnostic agents]; Metrizamide; Follitropin alfa; and Lisinopril   Social History   Social History  . Marital status: Married    Spouse name: N/A  . Number of children: 1  . Years of education: N/A   Social History Main Topics  . Smoking status: Never Smoker  . Smokeless tobacco: Never Used  . Alcohol use Yes     Comment: Rare  . Drug use: No  . Sexual activity: Not Asked   Other Topics Concern  . 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  They have one grown daughter.   Highest level of education:  Master's Degree     Family History:  The patient's family history includes Breast cancer in his sister; Healthy in his brother; Heart disease in his sister; Hypertension (age of onset: 67) in his mother; Liver cancer in his paternal grandfather; Other in his mother; Transient ischemic attack in his father; Vascular Disease (age of onset: 48) in his father.   Review of Systems:   Please see the history of present illness.     General:  No chills, fever, night sweats or weight changes. Positive for fatigue  and generalized weakness.  Cardiovascular:  No chest pain, edema, orthopnea, palpitations, paroxysmal nocturnal dyspnea. Positive for dyspnea on exertion.  Dermatological: No rash, lesions/masses Respiratory: No cough, dyspnea Urologic: No hematuria, dysuria Abdominal:   No nausea, vomiting, diarrhea, bright red blood per rectum, melena, or hematemesis Neurologic:  No visual changes, wkns, changes in mental status. All other systems reviewed and are otherwise negative except as noted above.   Physical Exam:    VS:  BP 101/62 (BP Location: Right Arm, Patient Position: Sitting, Cuff Size: Normal)   Pulse 65   Ht 5' 6"  (1.676 m)   Wt 188 lb 6.4 oz (85.5 kg)   SpO2 98%   BMI 30.41 kg/m    General: Well developed, Caucasian male appearing in no acute distress. Sitting comfortably in wheelchair.  Head: Normocephalic, atraumatic, sclera non-icteric, no xanthomas, nares are without discharge.  Neck: No carotid bruits. JVD not elevated.  Lungs: Respirations regular and unlabored, without wheezes or rales.  Heart: Regular rate and rhythm. No S3 or S4.  No murmur, no rubs, or gallops appreciated. Abdomen: Soft, non-tender, non-distended with normoactive bowel sounds. No hepatomegaly. No rebound/guarding. No obvious abdominal masses. Msk:  Strength and tone appear normal for age. No joint deformities or effusions. Extremities: No clubbing or cyanosis. No edema.  Distal pedal pulses are 2+ bilaterally. Neuro: Alert and oriented X 3. Moves all extremities spontaneously. No focal deficits noted. Psych:  Responds to questions appropriately with a normal affect. Skin: No rashes or lesions noted  Wt Readings from Last 3 Encounters:  05/16/16 188 lb 6.4 oz (85.5 kg)  05/12/16 185 lb (83.9 kg)  05/09/16 188 lb (85.3 kg)     Studies/Labs Reviewed:   EKG:  EKG is not ordered today.   Recent Labs: 05/02/2016: B Natriuretic Peptide 208.0 05/05/2016: Magnesium 2.2 05/09/2016: Pro B Natriuretic  peptide (BNP) 228.0 05/12/2016: ALT 17; BUN 44; Creatinine, Ser 2.24; Hemoglobin 12.6; Platelets 291; Potassium 4.9; Sodium 137   Lipid Panel    Component Value Date/Time  CHOL 108 03/27/2016 0541   TRIG 42 03/27/2016 0541   HDL 51 03/27/2016 0541   CHOLHDL 2.1 03/27/2016 0541   VLDL 8 03/27/2016 0541   LDLCALC 49 03/27/2016 0541    Additional studies/ records that were reviewed today include:   Echocardiogram: 05/01/2016 Study Conclusions  - Left ventricle: The cavity size was normal. Wall thickness was   increased in a pattern of mild LVH. Systolic function was   moderately to severely reduced. The estimated ejection fraction   was in the range of 30% to 35%. Features are consistent with a   pseudonormal left ventricular filling pattern, with concomitant   abnormal relaxation and increased filling pressure (grade 2   diastolic dysfunction). Doppler parameters are consistent with   high ventricular filling pressure. - Regional wall motion abnormality: Severe hypokinesis of the   apical anterior, apical septal, apical lateral, and apical   myocardium; moderate hypokinesis of the mid anterior, mid   anteroseptal, apical inferior, and mid inferolateral myocardium;   mild hypokinesis of the mid inferior myocardium. Basal anterior   and anterolateral hypokinesis. - Aortic valve: Moderately calcified annulus. Trileaflet. - Mitral valve: Severely calcified annulus. There was mild   regurgitation. - Left atrium: The atrium was moderately dilated. - Right ventricle: Systolic function was mildly reduced. - Inferior vena cava: Estimated CVP 15 mmHg.  Assessment:    1. Coronary artery disease involving native coronary artery of native heart with other form of angina pectoris ([place])   2. Chronic combined systolic and diastolic heart failure ([place])   3. Essential hypertension   4. Hyperlipidemia LDL goal <70   5. AKI (acute kidney injury) ([place])      Plan:   In order of problems  listed above:  1. Coronary Artery Disease with Unstable Angina - severe diffuse CAD by cath in 03/2015, declined CABG in the past. Recently admitted for CHF exacerbation with EF found to be reduced to 30-35%. Potential advanced CHF therapies were brought up during the admission but Mr. Leanos is not interested in being on these (IV Milrinone, etc).  - he denies any recent chest discomfort but still experiencing dyspnea with exertion.  - continues on ASA, Coreg, Plavix, statin therapy, Imdur, and recently started Ranexa.   - he was recently evaluated by Palliative Care with Hospice being recommended. A representative from Hospice is to come evaluate him for this later in the week. The patient and his wife wish to pursue Hospice Care as he reports being "tired" of going to the hospital and wishes to focus more on comfort as he knows his heart is a "ticking time bomb". Emotional support provided to the patient and his wife with this decision. I will arrange follow-up with Dr. Stanford Breed in 1 month for if he is not deemed a Hospice candidate, we will need to monitor him closely to avoid repeat hospitalizations. If deemed a candidate, I will leave the decision to keep or cancel the appointment up to the patient and his wife.   2. Chronic combined systolic and diastolic CHF - EF 16-10% by echo in 03/2015, now at 30-35% by echo in 04/2016. - he does not appear volume overloaded by physical exam. Weight has declined from 195 lbs at time of hospital discharge to 188 lbs today. - continue Lasix 83m BID with K+ supplementation. If Hospice Care is selected, would recommend continuing Lasix for palliation in regards to dyspnea and orthopnea.  - recently started on Coreg 3.1252mBID. Unable to titrate dosing  at this time secondary to hypotension. No ACE-I/ARB/ARNI secondary to hypotension and AKI.   3. HTN - BP at 101/62 during today's visit.  - continue Coreg 3.127m BID and Imdur 671mBID.   4. HLD - Lipid Panel  in 10/2015 showed total cholesterol 123, HDL 64, and LDL 49. - remains on Crestor 4013maily.   5. AKI - creatinine recently elevated at 2.24 on 3/1. Foley catheter placed at that time due to CT Imaging showing mild bilateral hydronephrosis which was thought to be secondary to bladder outlet obstruction.  - has follow-up with Urology next week.    Medication Adjustments/Labs and Tests Ordered: Current medicines are reviewed at length with the patient today.  Concerns regarding medicines are outlined above.  Medication changes, Labs and Tests ordered today are listed in the Patient Instructions below. Patient Instructions  Your physician recommends that you continue on your current medications as directed. Please refer to the Current Medication list given to you today.  Your physician recommends that you schedule a follow-up appointment in:  [address] [place] [place]     Signed, BriErma HeritageA [place]/07/2016 8:21 PM    [facility] [address]  [id number]: ([phone]: [phone] [address] [id number]: ([phone]

## 2016-05-16 ENCOUNTER — Ambulatory Visit (INDEPENDENT_AMBULATORY_CARE_PROVIDER_SITE_OTHER): Payer: Medicare HMO | Admitting: Student

## 2016-05-16 ENCOUNTER — Encounter: Payer: Self-pay | Admitting: Student

## 2016-05-16 ENCOUNTER — Telehealth: Payer: Self-pay | Admitting: Cardiology

## 2016-05-16 VITALS — BP 101/62 | HR 65 | Ht 66.0 in | Wt 188.4 lb

## 2016-05-16 DIAGNOSIS — I10 Essential (primary) hypertension: Secondary | ICD-10-CM | POA: Diagnosis not present

## 2016-05-16 DIAGNOSIS — I5042 Chronic combined systolic (congestive) and diastolic (congestive) heart failure: Secondary | ICD-10-CM | POA: Diagnosis not present

## 2016-05-16 DIAGNOSIS — E785 Hyperlipidemia, unspecified: Secondary | ICD-10-CM

## 2016-05-16 DIAGNOSIS — I25118 Atherosclerotic heart disease of native coronary artery with other forms of angina pectoris: Secondary | ICD-10-CM

## 2016-05-16 DIAGNOSIS — N179 Acute kidney failure, unspecified: Secondary | ICD-10-CM

## 2016-05-16 MED ORDER — CARVEDILOL 3.125 MG PO TABS: 3.1250 mg | ORAL_TABLET | Freq: Two times a day (BID) | ORAL | 11 refills | Status: AC

## 2016-05-16 NOTE — Patient Instructions (Addendum)
Your physician recommends that you continue on your current medications as directed. Please refer to the Current Medication list given to you today.  Your physician recommends that you schedule a follow-up appointment in:  1 MONTH  WITH  DR  Jens SomRENSHAW

## 2016-05-16 NOTE — Telephone Encounter (Signed)
Would you call pt and see if hospice is going to evaluate him. Please give me update. Hospital dc note indicates he is a candidate. Is that in the works. I referred to palliative care.

## 2016-05-16 NOTE — Telephone Encounter (Signed)
Left message for fran to call

## 2016-05-16 NOTE — Telephone Encounter (Signed)
Spoke with fran, they are evaluating the patient for palliative care and the wife has expressed interest in hospice. The patient has an appointment today and would like for us to discuss this with the patient and wife. brittney strader pa made aware.

## 2016-05-16 NOTE — Telephone Encounter (Signed)
New Message  Drenda FreezeFran voiced wanting to discuss hospice services for this patient.

## 2016-05-17 ENCOUNTER — Ambulatory Visit: Payer: Medicare HMO | Admitting: Podiatry

## 2016-05-17 ENCOUNTER — Telehealth: Payer: Self-pay | Admitting: Cardiology

## 2016-05-17 NOTE — Telephone Encounter (Signed)
Left message for fran, pt has an appt 06-22-16 and will call her once patient is seen.

## 2016-05-17 NOTE — Telephone Encounter (Signed)
Will need fu ov Brian Crenshaw  

## 2016-05-17 NOTE — Telephone Encounter (Signed)
Called Drenda FreezeFran back.   She's trying to find out if this patient was going to be referred to hospice/considered candidate. Seen 05/16/16 by Randall AnBrittany Strader PA.   Informed her based on my understanding after review of notes, decision for hospice to be deferred to Dr. Jens Somrenshaw, but I'm unsure if that was to be determined now or at next OV. Routing for guidance. She's aware we'll follow up w further information.

## 2016-05-17 NOTE — Telephone Encounter (Signed)
I did call the number to hospice. Tried to talk with Drenda FreezeFran. But call at 5:35 and message stated they were closed. I do agree with hospice agree. Please call hospice facility and let Drenda FreezeFran know. Thanks.

## 2016-05-17 NOTE — Telephone Encounter (Signed)
New Message   Drenda FreezeFran with [facility] called regarding a hospice referral for pt. Requesting a call back

## 2016-05-18 NOTE — Telephone Encounter (Signed)
Will we, Dr. Laury AxonLowne, be attending or would hospice provider need to be attending?

## 2016-05-19 ENCOUNTER — Telehealth: Payer: Self-pay | Admitting: Medical

## 2016-05-19 NOTE — Telephone Encounter (Signed)
I think it would be best for Hospice MD to be attending. Dr. Laury AxonLowne has never seen patient. I have given her periodic updates.

## 2016-05-20 MED FILL — HumaLOG 100 UNIT/ML SOLN: 100 | 15 days supply | Qty: 10 | Fill #0

## 2016-05-20 MED FILL — raNITIdine HCL 150 MG TABS: 150 | 15 days supply | Qty: 30 | Fill #5

## 2016-05-20 MED FILL — MIDODRINE HCL 10 MG TABLET: 10 | 15 days supply | Qty: 30 | Fill #3

## 2016-05-20 NOTE — Telephone Encounter (Signed)
Left VM notifying Drenda FreezeFran for attending

## 2016-05-23 DIAGNOSIS — N200 Calculus of kidney: Secondary | ICD-10-CM | POA: Diagnosis not present

## 2016-05-23 DIAGNOSIS — R339 Retention of urine, unspecified: Secondary | ICD-10-CM | POA: Diagnosis not present

## 2016-05-23 DIAGNOSIS — N401 Enlarged prostate with lower urinary tract symptoms: Secondary | ICD-10-CM | POA: Diagnosis not present

## 2016-05-23 DIAGNOSIS — N138 Other obstructive and reflux uropathy: Secondary | ICD-10-CM | POA: Diagnosis not present

## 2016-05-24 MED FILL — LORazepam 0.5 MG TABS: 0.5 | 3 days supply | Qty: 15 | Fill #0

## 2016-05-24 MED FILL — HALOPERIDOL 1 MG TABLET: 1 | 2 days supply | Qty: 12 | Fill #0

## 2016-05-25 MED FILL — MORPHINE SULF 100 MG/5 ML S: 100 | 10 days supply | Qty: 30 | Fill #0

## 2016-05-27 ENCOUNTER — Telehealth: Payer: Self-pay | Admitting: Medical

## 2016-05-27 MED FILL — RANEXA ER 1,000 MG TABLET: 1000 | 15 days supply | Qty: 30 | Fill #0

## 2016-05-27 NOTE — Telephone Encounter (Signed)
Called hospice at number given Drenda FreezeFran not available. So they gave me her confidential number. I left message stating approve of hospice. Explained had called approximate 2 weeks ago and tried to get through to her. I gave her my cell phone number if she needs to call me.

## 2016-05-30 MED FILL — MIDODRINE HCL 10 MG TABLET: 10 | 15 days supply | Qty: 30 | Fill #4

## 2016-05-30 MED FILL — ISOSORBIDE MN ER 60 MG TAB: 60 | 15 days supply | Qty: 15 | Fill #2

## 2016-05-30 MED FILL — HYDROCODON-APAP 5-325: 5-325 | 8 days supply | Qty: 45 | Fill #0

## 2016-05-31 ENCOUNTER — Encounter: Payer: Self-pay | Admitting: Podiatry

## 2016-05-31 ENCOUNTER — Ambulatory Visit (INDEPENDENT_AMBULATORY_CARE_PROVIDER_SITE_OTHER): Payer: Medicare Other | Admitting: Podiatry

## 2016-05-31 ENCOUNTER — Ambulatory Visit (HOSPITAL_BASED_OUTPATIENT_CLINIC_OR_DEPARTMENT_OTHER)
Admission: RE | Admit: 2016-05-31 | Discharge: 2016-05-31 | Disposition: A | Discharge status: Home / Self Care | Payer: Medicare Other | Source: Ambulatory Visit | Attending: Podiatry | Admitting: Podiatry

## 2016-05-31 VITALS — BP 107/70 | HR 76 | Temp 98.0°F | Resp 18

## 2016-05-31 DIAGNOSIS — M19071 Primary osteoarthritis, right ankle and foot: Secondary | ICD-10-CM | POA: Diagnosis not present

## 2016-05-31 DIAGNOSIS — S92301D Fracture of unspecified metatarsal bone(s), right foot, subsequent encounter for fracture with routine healing: Secondary | ICD-10-CM | POA: Diagnosis not present

## 2016-05-31 DIAGNOSIS — M19072 Primary osteoarthritis, left ankle and foot: Secondary | ICD-10-CM | POA: Diagnosis not present

## 2016-05-31 DIAGNOSIS — M14671 Charcot's joint, right ankle and foot: Secondary | ICD-10-CM

## 2016-05-31 DIAGNOSIS — M79671 Pain in right foot: Secondary | ICD-10-CM | POA: Diagnosis not present

## 2016-05-31 DIAGNOSIS — M79672 Pain in left foot: Secondary | ICD-10-CM | POA: Diagnosis present

## 2016-05-31 NOTE — Progress Notes (Signed)
Subjective: 61 year old male presents the office in follow-up evaluation of right foot fractures, Charcot. He states he is continue nonweightbearing as much possible. However since last appointment he has had multiple medical issues and he has recently gone to hospice, and he states acute rehabilitation. Denies any swelling. He gets some occasional redness to the right foot. He would like to have the left foot checked as he had some mild pain but denies any injury. No significant swelling that he has noticed. Denies any skin break down. Denies any systemic complaints such as fevers, chills, nausea, vomiting. No acute changes since last appointment, and no other complaints at this time.   Objective: AAO x3, NAD; presents today wearing a flat regular shoe. DP/PT pulses palpable bilaterally, CRT less than 3 seconds Protective sensation decreased with Simms Weinstein monofilament The right foot does appear to be wider than the left foot and there is the start of a medial plantar bulge on the foot. This has not changed in size. There is minimal edema to the right foot there is no erythema or increase in warmth. There is no area of tenderness however he does have significant neuropathy. Exam unchanged. There is no erythema.  There is no pain the left foot there is no swelling. There is no skin breakdown present. Bruising has resolved.  No open lesions or pre-ulcerative lesions were identified today.  No pain with calf compression, swelling, warmth, erythema  Assessment: 61 year old male right foot Lisfranc fracture/Charcot; left foot pain  Plan: -Treatment options discussed including all alternatives, risks, and complications -X-rays were obtained and reviewed with the patient.  -at this time a discussed with him a custom brace to the right foot given the Charcot. I will have him follow[place] [facility] [place]. -On the left foot there is no pain or swelling today. Old fracture to the 5th  metatarsal. Will continue to monitor.  -Follow up after bracing or sooner if needed.  Ovid CurdMatthew Rehema Muffley, DPM  -Etiology of symptoms were discussed -X-rays were ordered and reviewed with the patient. Fracture, subluxation of Lisfranc joint likely result of Charcot. -At this time ordered a CT scan of the right foot. -I again today stressed nonweightbearing in a cast for mobilization however he declined this. I discussed with him risks and complications of not having the cast. He understands this. We will do a cam boot. He hater to stay nonwbearing. I discussed with him sequela Charcot including ulceration, foot deformity, loss of leg. Discussed possible surgical intervention however given his medical conditions we will continue conservative treatment for now and the patient agreement to this and let hold off on any surgical intervention as well. -Follow-up in 3 weeks or sooner if any problems arise. In the meantime, encouraged to call the office with any questions, concerns, change in symptoms.   Ovid CurdMatthew Seleny Allbright, DPM

## 2016-05-31 NOTE — Telephone Encounter (Signed)
Thanks

## 2016-06-02 MED FILL — ZOLPIDEM TARTRATE 5 MG TAB: 5 | 15 days supply | Qty: 15 | Fill #0

## 2016-06-02 MED FILL — SENEXON-S TABLET: 8.6-50 | 8 days supply | Qty: 60 | Fill #0

## 2016-06-02 MED FILL — POTASSIUM CL ER 20 MEQ TABL: 20 | 15 days supply | Qty: 15 | Fill #0

## 2016-06-03 MED FILL — FREESTYLE TEST STRIPS: 11 days supply | Qty: 100 | Fill #4

## 2016-06-09 ENCOUNTER — Other Ambulatory Visit: Payer: Medicare HMO

## 2016-06-09 ENCOUNTER — Encounter: Payer: Self-pay | Admitting: Cardiology

## 2016-06-09 MED FILL — raNITIdine HCL 150 MG TABS: 150 | 15 days supply | Qty: 30 | Fill #6

## 2016-06-09 MED FILL — GABAPENTIN 600 MG TABLET: 600 | 15 days supply | Qty: 45 | Fill #1

## 2016-06-09 MED FILL — METOCLOPRAMIDE 10 MG TABLET: 10 | 15 days supply | Qty: 45 | Fill #0

## 2016-06-09 MED FILL — FUROSEMIDE 20 MG TABLET: 20 | 15 days supply | Qty: 45 | Fill #1

## 2016-06-14 NOTE — Progress Notes (Signed)
HPI: FU CAD. He had his first myocardial infarction in 2010. He has had subsequent events. Cardiac catheterization in September 2014 and had stent to the mid right coronary artery. There was note of a mid-90% LAD; small caliber and not suitable for PCI or grafting. Ejection fraction 60%. Procedure complicated by contrast nephropathy requiring dialysis. Carotid Dopplers February 2015 showed mild focal plaques in both carotid bifurcations but no hemodynamically significant stenosis. Also with h/o orthostatic hypotension. Last cardiac catheterization in January 2017 showed severe diffuse disease in the LAD and circumflex and diffuse distal disease in the RCA. The stent in the right coronary was patent. Echocardiogram January 2017 showed normal LV function. Patient was treated medically for his coronary disease as his targets were felt to be poor and he did not wish to pursue further evaluation. Last echocardiogram February 2018 showed ejection fraction 26-83%, grade 2 diastolic dysfunction, mild mitral regurgitation and moderate left atrial enlargement. Patient last admitted February 2018 with congestive heart failure. Patient recently contacted the office and inquired about hospice services.  Since last seen, Patient does have dyspnea on exertion but no orthopnea, PND, pedal edema or syncope. Occasional chest pain.  Current Outpatient Prescriptions  Medication Sig Dispense Refill  . aspirin 81 MG tablet Take 81 mg by mouth daily.    Marland Kitchen atropine 1 % ophthalmic solution Place 1 drop into the right eye 2 (two) times daily.     . brimonidine-timolol (COMBIGAN) 0.2-0.5 % ophthalmic solution Place 1 drop into the left eye every 12 (twelve) hours.     . carvedilol (COREG) 3.125 MG tablet Take 1 tablet (3.125 mg total) by mouth 2 (two) times daily with a meal. 30 tablet 11  . clopidogrel (PLAVIX) 75 MG tablet Take 1 tablet (75 mg total) by mouth daily. 90 tablet 3  . co-enzyme Q-10 30 MG capsule Take 30 mg  by mouth daily.     . Difluprednate (DUREZOL) 0.05 % EMUL Place 1 drop into the right eye 3 (three) times daily.    . furosemide (LASIX) 20 MG tablet Take 2 tablets (40 mg total) by mouth 2 (two) times daily. 60 tablet 0  . gabapentin (NEURONTIN) 600 MG tablet TAKE 1 TABLET BY MOUTH 3 TIMES DAILY. 90 tablet 2  . glucagon 1 MG injection Inject 1 mg into the vein once as needed (Emergency Test Kit). Reported on 04/07/2015    . HYDROcodone-acetaminophen (NORCO) 5-325 MG tablet Take 1 tablet by mouth every 6 (six) hours as needed for moderate pain. 30 tablet 0  . insulin lispro (HUMALOG) 100 UNIT/ML injection Inject into the skin as directed. Use in Insulin Pump, Max daily dose: 100 units/day    . isosorbide mononitrate (IMDUR) 60 MG 24 hr tablet Take 1 tablet (60 mg total) by mouth 2 (two) times daily. 60 tablet 0  . latanoprost (XALATAN) 0.005 % ophthalmic solution Place 1 drop into the left eye at bedtime.     . midodrine (PROAMATINE) 10 MG tablet Take 1 tablet (10 mg total) by mouth 3 (three) times daily.    . nitroGLYCERIN (NITROSTAT) 0.4 MG SL tablet Place 1 tablet (0.4 mg total) under the tongue every 5 (five) minutes x 3 doses as needed for chest pain. 25 tablet 12  . potassium chloride SA (K-DUR,KLOR-CON) 20 MEQ tablet Take 1 tablet (20 mEq total) by mouth daily. 30 tablet 6  . ranitidine (ZANTAC) 150 MG tablet TAKE 1 TABLET BY MOUTH TWICE DAILY (Patient taking differently: TAKE  150 mg BY MOUTH TWICE DAILY) 60 tablet 5  . ranolazine (RANEXA) 1000 MG SR tablet Take 1 tablet (1,000 mg total) by mouth 2 (two) times daily. 60 tablet 0  . rosuvastatin (CRESTOR) 40 MG tablet Take 40 mg by mouth daily.    . SENEXON-S 8.6-50 MG tablet TAKE 1 TABLET BY MOUTH DAILY. (Patient taking differently: TAKE 1 TABLET BY MOUTH TWICE DAILY) 100 tablet 1   No current facility-administered medications for this visit.      Past Medical History:  Diagnosis Date  . CAD (coronary artery disease)    a. 03/2015: cath  showing severe diffuse CAD along LAD, LCx, and distal RCA. Patient declined CABG with medical management recommended.  . Cataracts, bilateral   . CHF (congestive heart failure) ([place])   . Chicken pox   . Contrast dye induced nephropathy   . Diabetes type 1, uncontrolled ([place])   . Diabetic retinopathy ([place])   . Frequent headaches   . Heart attack 09.13.2014   x5  . Hyperlipidemia   . Measles, [place] (rubella)   . Mumps   . Orthostatic hypotension   . OSA (obstructive sleep apnea) 07/17/2013  . Scarlet fever     Past Surgical History:  Procedure Laterality Date  . APPENDECTOMY  1966  . CARDIAC CATHETERIZATION N/A 03/18/2015   Procedure: Left Heart Cath and Coronary Angiography;  Surgeon: Belva Crome, MD;  Location: [facility];  Service: Cardiovascular;  Laterality: N/A;  . CORONARY ANGIOPLASTY WITH STENT PLACEMENT    . REFRACTIVE SURGERY     Retinopathy  . WISDOM TOOTH EXTRACTION      Social History   Social History  . Marital status: Married    Spouse name: N/A  . Number of children: 1  . Years of education: N/A   Occupational History  . Not on file.   Social History Main Topics  . Smoking status: Never Smoker  . Smokeless tobacco: Never Used  . Alcohol use Yes     Comment: Rare  . Drug use: No  . Sexual activity: Not on file   Other Topics Concern  . 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  They have one grown daughter.   Highest level of education:  Master's Degree    Family History  Problem Relation Age of Onset  . Hypertension Mother 36    Deceased  . Other Mother     s/p PPM  . Vascular Disease Father 4    Deceased  . Transient ischemic attack Father   . Liver cancer Paternal Grandfather   . Breast cancer Sister   . Heart disease Sister     Ablation for rhythm disturbance  . Healthy Brother     x1    ROS: no fevers or chills, productive cough, hemoptysis, dysphasia,  odynophagia, melena, hematochezia, dysuria, hematuria, rash, seizure activity, orthopnea, PND, pedal edema, claudication. Remaining systems are negative.  Physical Exam: Well-developed Chronically ill-appearing in no acute distress.  Skin is warm and dry.  HEENT is normal.  Neck is supple.  Chest is clear to auscultation with normal expansion.  Cardiovascular exam is regular rate and rhythm.  Abdominal exam nontender or distended. No masses palpated. Extremities show no edema. neuro grossly intact  ECG- sinus rhythm with first-degree AV block, left anterior fascicular block, lateral T-wave inversion. personally reviewed  A/P  1 Hyperlipidemia-continue statin.   2  coronary artery disease-continue aspirin, Plavix and statin. Long discussion with patient today. He would like to be a no CODE BLUE including no intubation, CPR or defibrillation. He has requested hospice care which is in place. He would like comfort measures only including medications. No procedures.  3 orthostatic hypotension-blood pressure controlled today. We will not advance medications given history of orthostasis.   4 Chronic systolic and diastolic CHF-patient appears to be euvolemic. Continue present dose of diuretics. Continue low-dose beta blocker. No ACE inhibitor given renal insufficiency. I will not add hydralazine given history of orthostatic hypotension.  Kirk Ruths, MD

## 2016-06-16 MED FILL — ISOSORBIDE MN ER 60 MG TAB: 60 | 15 days supply | Qty: 15 | Fill #3

## 2016-06-16 MED FILL — POTASSIUM CL ER 20 MEQ TABL: 20 | 15 days supply | Qty: 15 | Fill #1

## 2016-06-21 MED FILL — HYDROCODON-APAP 5-325: 5-325 | 8 days supply | Qty: 45 | Fill #0

## 2016-06-21 MED FILL — ZOLPIDEM TARTRATE 10 MG TAB: 10 | 15 days supply | Qty: 15 | Fill #0

## 2016-06-21 MED FILL — HumaLOG 100 UNIT/ML SOLN: 100 | 15 days supply | Qty: 10 | Fill #1

## 2016-06-22 ENCOUNTER — Ambulatory Visit (INDEPENDENT_AMBULATORY_CARE_PROVIDER_SITE_OTHER): Admitting: Cardiology

## 2016-06-22 ENCOUNTER — Ambulatory Visit: Payer: Medicare HMO | Admitting: Cardiology

## 2016-06-22 ENCOUNTER — Encounter: Payer: Self-pay | Admitting: Cardiology

## 2016-06-22 VITALS — BP 151/77 | HR 77 | Ht 66.0 in | Wt 190.4 lb

## 2016-06-22 DIAGNOSIS — I5042 Chronic combined systolic (congestive) and diastolic (congestive) heart failure: Secondary | ICD-10-CM | POA: Diagnosis not present

## 2016-06-22 DIAGNOSIS — I2510 Atherosclerotic heart disease of native coronary artery without angina pectoris: Secondary | ICD-10-CM | POA: Diagnosis not present

## 2016-06-22 DIAGNOSIS — E7800 Pure hypercholesterolemia, unspecified: Secondary | ICD-10-CM

## 2016-06-22 NOTE — Patient Instructions (Signed)
Your physician recommends that you schedule a follow-up appointment in: 3 MONTHS WITH DR CRENSHAW  

## 2016-06-23 DIAGNOSIS — E1042 Type 1 diabetes mellitus with diabetic polyneuropathy: Secondary | ICD-10-CM | POA: Diagnosis not present

## 2016-06-23 DIAGNOSIS — E785 Hyperlipidemia, unspecified: Secondary | ICD-10-CM | POA: Diagnosis not present

## 2016-06-23 DIAGNOSIS — E10319 Type 1 diabetes mellitus with unspecified diabetic retinopathy without macular edema: Secondary | ICD-10-CM | POA: Diagnosis not present

## 2016-06-24 ENCOUNTER — Other Ambulatory Visit: Payer: Self-pay | Admitting: Physician Assistant

## 2016-06-24 MED FILL — FUROSEMIDE 20 MG TABLET: 20 | 15 days supply | Qty: 45 | Fill #2

## 2016-06-24 MED FILL — RANEXA ER 1,000 MG TABLET: 1000 | 15 days supply | Qty: 30 | Fill #1

## 2016-06-24 MED FILL — CLOPIDOGREL 75 MG TABLET: 75 | 15 days supply | Qty: 15 | Fill #1

## 2016-06-24 MED FILL — MIDODRINE HCL 10 MG TABLET: 10 | 15 days supply | Qty: 45 | Fill #0

## 2016-06-24 MED FILL — ROSUVASTATIN CALCIUM 40 MG: 40 | 30 days supply | Qty: 30 | Fill #2

## 2016-06-24 NOTE — Telephone Encounter (Signed)
rx ranitidine sent to the pharmacy.

## 2016-06-27 MED FILL — raNITIdine HCL 150 MG TABS: 150 | 15 days supply | Qty: 30 | Fill #0

## 2016-06-28 NOTE — Addendum Note (Signed)
Addended by: Beryle Quant on: 06/28/2016 01:30 PM   Modules accepted: Orders

## 2016-06-29 DIAGNOSIS — E103592 Type 1 diabetes mellitus with proliferative diabetic retinopathy without macular edema, left eye: Secondary | ICD-10-CM | POA: Diagnosis not present

## 2016-06-29 DIAGNOSIS — E103511 Type 1 diabetes mellitus with proliferative diabetic retinopathy with macular edema, right eye: Secondary | ICD-10-CM | POA: Diagnosis not present

## 2016-06-29 DIAGNOSIS — H40052 Ocular hypertension, left eye: Secondary | ICD-10-CM | POA: Diagnosis not present

## 2016-06-29 DIAGNOSIS — H40041 Steroid responder, right eye: Secondary | ICD-10-CM | POA: Diagnosis not present

## 2016-06-29 DIAGNOSIS — T380X5A Adverse effect of glucocorticoids and synthetic analogues, initial encounter: Secondary | ICD-10-CM | POA: Diagnosis not present

## 2016-06-30 MED FILL — HYDROmorphone HCL 2 MG TABS: 2 | 3 days supply | Qty: 30 | Fill #0

## 2016-07-01 MED FILL — ISOSORBIDE MN ER 60 MG TAB: 60 | 15 days supply | Qty: 15 | Fill #4

## 2016-07-01 MED FILL — GABAPENTIN 600 MG TABLET: 600 | 15 days supply | Qty: 45 | Fill #2

## 2016-07-05 MED FILL — FREESTYLE TEST STRIPS: 11 days supply | Qty: 100 | Fill #5

## 2016-07-05 MED FILL — LORazepam 0.5 MG TABS: 0.5 | 3 days supply | Qty: 15 | Fill #1

## 2016-07-05 MED FILL — HumaLOG 100 UNIT/ML SOLN: 100 | 15 days supply | Qty: 10 | Fill #4

## 2016-07-06 ENCOUNTER — Other Ambulatory Visit: Payer: Medicare HMO

## 2016-07-11 MED FILL — FUROSEMIDE 20 MG TABLET: 20 | 15 days supply | Qty: 45 | Fill #3

## 2016-07-11 MED FILL — SENEXON-S TABLET: 8.6-50 | 8 days supply | Qty: 60 | Fill #1

## 2016-07-11 MED FILL — raNITIdine HCL 150 MG TABS: 150 | 15 days supply | Qty: 30 | Fill #1

## 2016-07-11 MED FILL — POTASSIUM CL ER 20 MEQ TABL: 20 | 15 days supply | Qty: 15 | Fill #2

## 2016-07-11 MED FILL — MIDODRINE HCL 10 MG TABLET: 10 | 15 days supply | Qty: 45 | Fill #1

## 2016-07-12 MED FILL — ZOLPIDEM TARTRATE 10 MG TAB: 10 | 15 days supply | Qty: 15 | Fill #1

## 2016-07-13 MED FILL — LORazepam 0.5 MG TABS: 0.5 | 5 days supply | Qty: 30 | Fill #0

## 2016-07-13 MED FILL — HYDROmorphone HCL 2 MG TABS: 2 | 3 days supply | Qty: 30 | Fill #0

## 2016-07-13 MED FILL — fentaNYL 12 MCG/HR PT72: 12 | 15 days supply | Qty: 5 | Fill #0

## 2016-07-18 ENCOUNTER — Other Ambulatory Visit: Payer: Medicare HMO

## 2016-07-18 MED FILL — ISOSORBIDE MN ER 60 MG TAB: 60 | 15 days supply | Qty: 15 | Fill #5

## 2016-07-19 MED FILL — HALOPERIDOL 1 MG TABLET: 1 | 2 days supply | Qty: 12 | Fill #1

## 2016-07-19 MED FILL — METOCLOPRAMIDE 10 MG TABLET: 10 | 15 days supply | Qty: 45 | Fill #1

## 2016-07-19 MED FILL — HumaLOG 100 UNIT/ML SOLN: 100 | 15 days supply | Qty: 10 | Fill #5

## 2016-07-19 MED FILL — RANEXA ER 1,000 MG TABLET: 1000 | 15 days supply | Qty: 30 | Fill #0

## 2016-07-19 MED FILL — HYDROmorphone HCL 2 MG TABS: 2 | 3 days supply | Qty: 30 | Fill #0

## 2016-07-19 MED FILL — FREESTYLE TEST STRIPS: 11 days supply | Qty: 100 | Fill #6

## 2016-07-20 MED FILL — CLOPIDOGREL 75 MG TABLET: 75 | 15 days supply | Qty: 15 | Fill #2

## 2016-07-21 ENCOUNTER — Other Ambulatory Visit: Payer: Medicare HMO

## 2016-07-26 ENCOUNTER — Other Ambulatory Visit: Payer: Self-pay | Admitting: Cardiology

## 2016-07-26 MED FILL — POTASSIUM CL ER 20 MEQ TABL: 20 | 15 days supply | Qty: 15 | Fill #3

## 2016-07-26 MED FILL — raNITIdine HCL 150 MG TABS: 150 | 15 days supply | Qty: 30 | Fill #2

## 2016-07-26 MED FILL — ROSUVASTATIN CALCIUM 40 MG: 40 | 30 days supply | Qty: 30 | Fill #0

## 2016-07-26 MED FILL — GABAPENTIN 600 MG TABLET: 600 | 15 days supply | Qty: 45 | Fill #3

## 2016-07-26 MED FILL — ZOLPIDEM TARTRATE 10 MG TAB: 10 | 15 days supply | Qty: 15 | Fill #2

## 2016-07-26 MED FILL — ISOSORBIDE MN ER 60 MG TAB: 60 | 15 days supply | Qty: 15 | Fill #6

## 2016-07-26 MED FILL — FUROSEMIDE 20 MG TABLET: 20 | 15 days supply | Qty: 45 | Fill #4

## 2016-07-26 MED FILL — CARVEDILOL 3.125 MG TABLET: 3.125 | 15 days supply | Qty: 30 | Fill #0

## 2016-07-26 MED FILL — SENEXON-S TABLET: 8.6-50 | 8 days supply | Qty: 60 | Fill #2

## 2016-07-27 MED FILL — fentaNYL 12 MCG/HR PT72: 12 | 15 days supply | Qty: 5 | Fill #0

## 2016-08-01 DIAGNOSIS — G4733 Obstructive sleep apnea (adult) (pediatric): Secondary | ICD-10-CM | POA: Diagnosis not present

## 2016-08-02 MED FILL — RANEXA ER 1,000 MG TABLET: 1000 | 15 days supply | Qty: 30 | Fill #1

## 2016-08-03 MED FILL — CARVEDILOL 3.125 MG TABLET: 3.125 | 15 days supply | Qty: 30 | Fill #1

## 2016-08-03 MED FILL — MIDODRINE HCL 10 MG TABLET: 10 | 15 days supply | Qty: 45 | Fill #2

## 2016-08-03 MED FILL — HumaLOG 100 UNIT/ML SOLN: 100 | 15 days supply | Qty: 10 | Fill #6

## 2016-08-03 MED FILL — FUROSEMIDE 20 MG TABLET: 20 | 15 days supply | Qty: 45 | Fill #5

## 2016-08-05 MED FILL — BISCOLAX 10 MG SUPPOSITORY: 10 | 12 days supply | Qty: 12 | Fill #0

## 2016-08-09 MED FILL — CLOPIDOGREL 75 MG TABLET: 75 | 15 days supply | Qty: 15 | Fill #3

## 2016-08-09 MED FILL — fentaNYL 12 MCG/HR PT72: 12 | 15 days supply | Qty: 5 | Fill #0

## 2016-08-09 MED FILL — ISOSORBIDE MN ER 60 MG TAB: 60 | 15 days supply | Qty: 45 | Fill #0

## 2016-08-09 MED FILL — HYDROmorphone HCL 2 MG TABS: 2 | 3 days supply | Qty: 30 | Fill #0

## 2016-08-11 MED FILL — POTASSIUM CL ER 20 MEQ TABL: 20 | 15 days supply | Qty: 15 | Fill #0

## 2016-08-22 MED FILL — COMBIGAN EYE DROPS: 0.2-0.5 | 50 days supply | Qty: 5 | Fill #0

## 2016-09-15 NOTE — Progress Notes (Deleted)
HPI: FU CAD. He had his first myocardial infarction in 2010. He has had subsequent events. September 2014 had stent to the mid right coronary artery. There was note of a mid-90% LAD; small caliber and not suitable for PCI or grafting. Ejection fraction 60%. Procedure complicated by contrast nephropathy requiring dialysis. Carotid Dopplers February 2015 showed mild focal plaques in both carotid bifurcations but no hemodynamically significant stenosis. Also with h/o orthostatic hypotension. Last cardiac catheterization in January 2017 showed severe diffuse disease in the LAD and circumflex and diffuse distal disease in the RCA. The stent in the right coronary was patent. Echocardiogram January 2017 showed normal LV function. Patient was treated medically for his coronary disease as his targets were felt to be poor and he did not wish to pursue further evaluation. Last echocardiogram February 2018 showed ejection fraction 72-53%, grade 2 diastolic dysfunction, mild mitral regurgitation and moderate left atrial enlargement. Patient last admitted February 2018 with congestive heart failure. At last office visit patient requested no CODE BLUE and hospice care. He wanted comfort measures only. Since last seen,   Current Outpatient Prescriptions  Medication Sig Dispense Refill  . aspirin 81 MG tablet Take 81 mg by mouth daily.    Marland Kitchen atropine 1 % ophthalmic solution Place 1 drop into the right eye 2 (two) times daily.     . brimonidine-timolol (COMBIGAN) 0.2-0.5 % ophthalmic solution Place 1 drop into the left eye every 12 (twelve) hours.     . carvedilol (COREG) 3.125 MG tablet Take 1 tablet (3.125 mg total) by mouth 2 (two) times daily with a meal. 30 tablet 11  . clopidogrel (PLAVIX) 75 MG tablet Take 1 tablet (75 mg total) by mouth daily. 90 tablet 3  . co-enzyme Q-10 30 MG capsule Take 30 mg by mouth daily.     . Difluprednate (DUREZOL) 0.05 % EMUL Place 1 drop into the right eye 3 (three) times  daily.    . furosemide (LASIX) 20 MG tablet Take 2 tablets (40 mg total) by mouth 2 (two) times daily. 60 tablet 0  . gabapentin (NEURONTIN) 600 MG tablet TAKE 1 TABLET BY MOUTH 3 TIMES DAILY. 90 tablet 2  . glucagon 1 MG injection Inject 1 mg into the vein once as needed (Emergency Test Kit). Reported on 04/07/2015    . HYDROcodone-acetaminophen (NORCO) 5-325 MG tablet Take 1 tablet by mouth every 6 (six) hours as needed for moderate pain. 30 tablet 0  . insulin lispro (HUMALOG) 100 UNIT/ML injection Inject into the skin as directed. Use in Insulin Pump, Max daily dose: 100 units/day    . isosorbide mononitrate (IMDUR) 60 MG 24 hr tablet Take 1 tablet (60 mg total) by mouth 2 (two) times daily. 60 tablet 0  . latanoprost (XALATAN) 0.005 % ophthalmic solution Place 1 drop into the left eye at bedtime.     . midodrine (PROAMATINE) 10 MG tablet Take 1 tablet (10 mg total) by mouth 3 (three) times daily.    . nitroGLYCERIN (NITROSTAT) 0.4 MG SL tablet Place 1 tablet (0.4 mg total) under the tongue every 5 (five) minutes x 3 doses as needed for chest pain. 25 tablet 12  . potassium chloride SA (K-DUR,KLOR-CON) 20 MEQ tablet Take 1 tablet (20 mEq total) by mouth daily. 30 tablet 6  . ranitidine (ZANTAC) 150 MG tablet Take 1 tablet (150 mg total) by mouth 2 (two) times daily. 60 tablet 5  . ranolazine (RANEXA) 1000 MG SR tablet Take 1  tablet (1,000 mg total) by mouth 2 (two) times daily. 60 tablet 0  . rosuvastatin (CRESTOR) 40 MG tablet Take 40 mg by mouth daily.    . rosuvastatin (CRESTOR) 40 MG tablet Take 1 tablet (40 mg total) by mouth daily. 90 tablet 3  . SENEXON-S 8.6-50 MG tablet TAKE 1 TABLET BY MOUTH DAILY. (Patient taking differently: TAKE 1 TABLET BY MOUTH TWICE DAILY) 100 tablet 1   No current facility-administered medications for this visit.      Past Medical History:  Diagnosis Date  . CAD (coronary artery disease)    a. 03/2015: cath showing severe diffuse CAD along LAD, LCx, and  distal RCA. Patient declined CABG with medical management recommended.  . Cataracts, bilateral   . CHF (congestive heart failure) ([place])   . Chicken pox   . Contrast dye induced nephropathy   . Diabetes type 1, uncontrolled ([place])   . Diabetic retinopathy ([place])   . Frequent headaches   . Heart attack ([place]) 09.13.2014   x5  . Hyperlipidemia   . Measles, [place] (rubella)   . Mumps   . Orthostatic hypotension   . OSA (obstructive sleep apnea) 07/17/2013  . Scarlet fever     Past Surgical History:  Procedure Laterality Date  . APPENDECTOMY  1966  . CARDIAC CATHETERIZATION N/A 03/18/2015   Procedure: Left Heart Cath and Coronary Angiography;  Surgeon: Belva Crome, MD;  Location: [facility];  Service: Cardiovascular;  Laterality: N/A;  . CORONARY ANGIOPLASTY WITH STENT PLACEMENT    . REFRACTIVE SURGERY     Retinopathy  . WISDOM TOOTH EXTRACTION      Social History   Social History  . Marital status: Married    Spouse name: N/A  . Number of children: 1  . Years of education: N/A   Occupational History  . Not on file.   Social History Main Topics  . Smoking status: Never Smoker  . Smokeless tobacco: Never Used  . Alcohol use Yes     Comment: Rare  . Drug use: No  . Sexual activity: Not on file   Other Topics Concern  . 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  They have one grown daughter.   Highest level of education:  Master's Degree    Family History  Problem Relation Age of Onset  . Hypertension Mother 73       Deceased  . Other Mother        s/p PPM  . Vascular Disease Father 30       Deceased  . Transient ischemic attack Father   . Liver cancer Paternal Grandfather   . Breast cancer Sister   . Heart disease Sister        Ablation for rhythm disturbance  . Healthy Brother        x1    ROS: no fevers or chills, productive cough, hemoptysis, dysphasia, odynophagia, melena,  hematochezia, dysuria, hematuria, rash, seizure activity, orthopnea, PND, pedal edema, claudication. Remaining systems are negative.  Physical Exam: Well-developed well-nourished in no acute distress.  Skin is warm and dry.  HEENT is normal.  Neck is supple.  Chest is clear to auscultation with normal expansion.  Cardiovascular exam is regular rate and rhythm.  Abdominal exam nontender or distended. No masses palpated. Extremities show no edema. neuro grossly intact  ECG- personally reviewed  A/P  1 coronary artery disease-continue  aspirin, Plavix and statin. Patient wants only comfort measures and medications. No procedures. He is a no CODE BLUE and hospice care is in place.  2 hyperlipidemia-continue statin.  3 chronic systolic/diastolic congestive heart failure-patient appears to be euvolemic. Continue present dose of diuretics. We will not add an ACE inhibitor given renal insufficiency. No hydralazine/nitrates given history of orthostatic hypotension. Continue low-dose beta blocker.   4 orthostatic hypotension-symptoms are recently well controlled at present.     Kirk Ruths, MD

## 2016-09-28 ENCOUNTER — Ambulatory Visit: Payer: Medicare HMO | Admitting: Cardiology

## 2016-10-11 ENCOUNTER — Telehealth: Payer: Self-pay | Admitting: *Deleted

## 2016-10-11 NOTE — Telephone Encounter (Signed)
Received Physician Order Confirmation from [employer], forwarded to provider/SLS 07/31

## 2016-10-13 ENCOUNTER — Telehealth: Payer: Self-pay | Admitting: Medical

## 2016-10-13 ENCOUNTER — Telehealth: Payer: Self-pay | Admitting: Cardiology

## 2016-10-13 ENCOUNTER — Telehealth: Payer: Self-pay | Admitting: *Deleted

## 2016-10-14 NOTE — Telephone Encounter (Signed)
Noted.  I'm sorry to hear that. Please find sympathy card placed on your desk.

## 2016-10-14 NOTE — Telephone Encounter (Signed)
I will make sure this is complete.

## 2016-11-12 NOTE — Telephone Encounter (Signed)
Hospice called an let Timothy Alvarez know pt passed away. Do we ever send sympathy cards?

## 2016-11-12 NOTE — Telephone Encounter (Signed)
Also who changed his status to deceased in epic?

## 2016-11-12 NOTE — Telephone Encounter (Signed)
Left message for wife of our condolence, she is to call if we can do anything for the family.

## 2016-11-12 NOTE — Telephone Encounter (Signed)
Hospice called to let us know that patient passed away 11/05/2016 at 1220pm

## 2016-11-12 NOTE — Telephone Encounter (Signed)
Opened extra time by accident.

## 2016-11-12 NOTE — Telephone Encounter (Signed)
She wanted Dr Jens Somrenshaw to know that the pt passed this morning at 12:20A.M.

## 2016-11-12 DEATH — deceased

## 2016-12-09 ENCOUNTER — Telehealth: Payer: Self-pay | Admitting: *Deleted

## 2016-12-09 NOTE — Telephone Encounter (Signed)
Received request for Medical Records from [facility] Record Retrieval; forwarded to [place] for email/scan/SLS 09/28

## 2018-05-31 IMAGING — DX DG FOOT COMPLETE 3+V*R*
3 series · 3 of 3 positions shown · non-contrast
Comparison: 12/22/2015

CLINICAL DATA: Fifth metatarsal fracture.  Great toe pain.

EXAM:
RIGHT FOOT COMPLETE - 3+ VIEW

[foot ap]
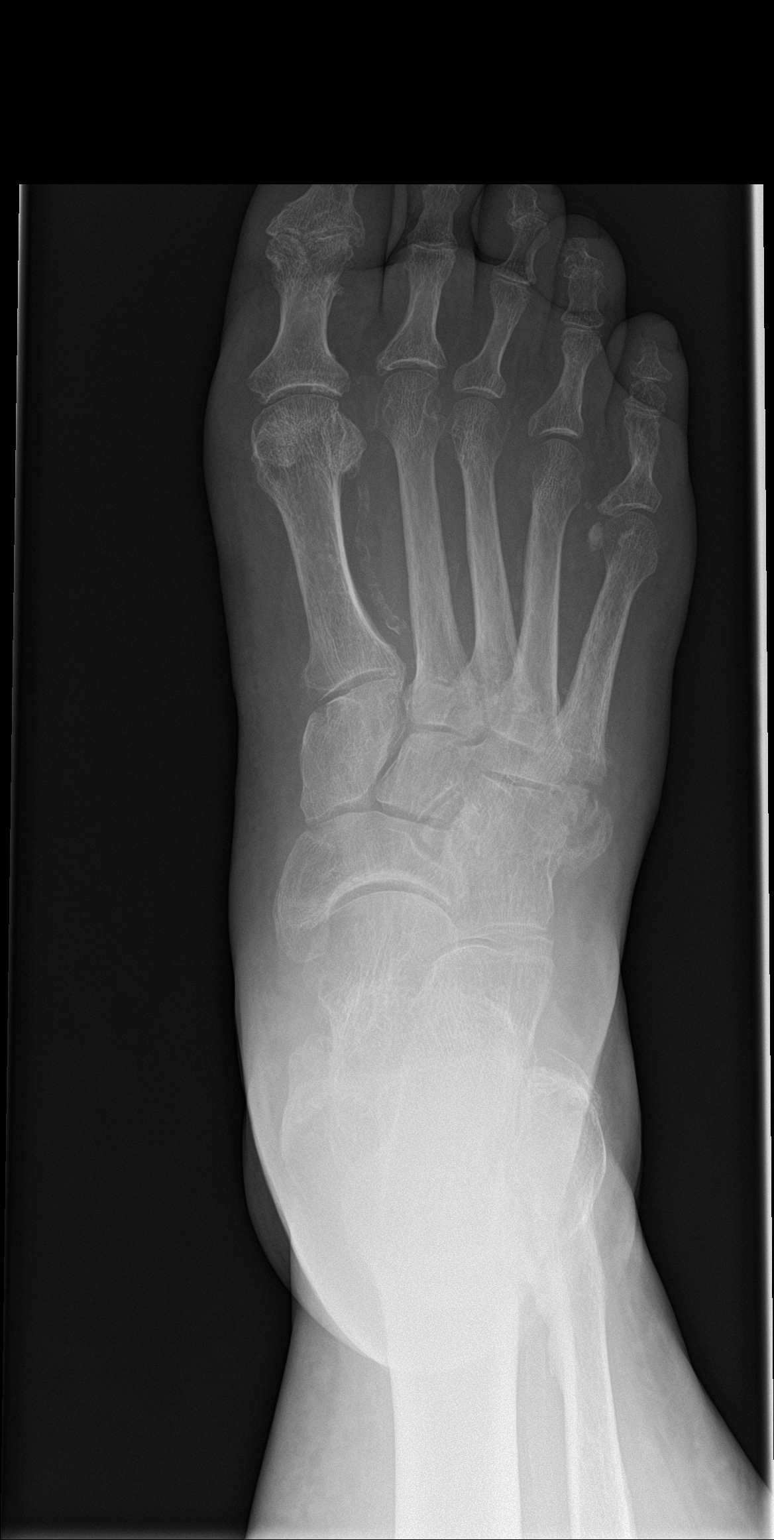

[foot obl]
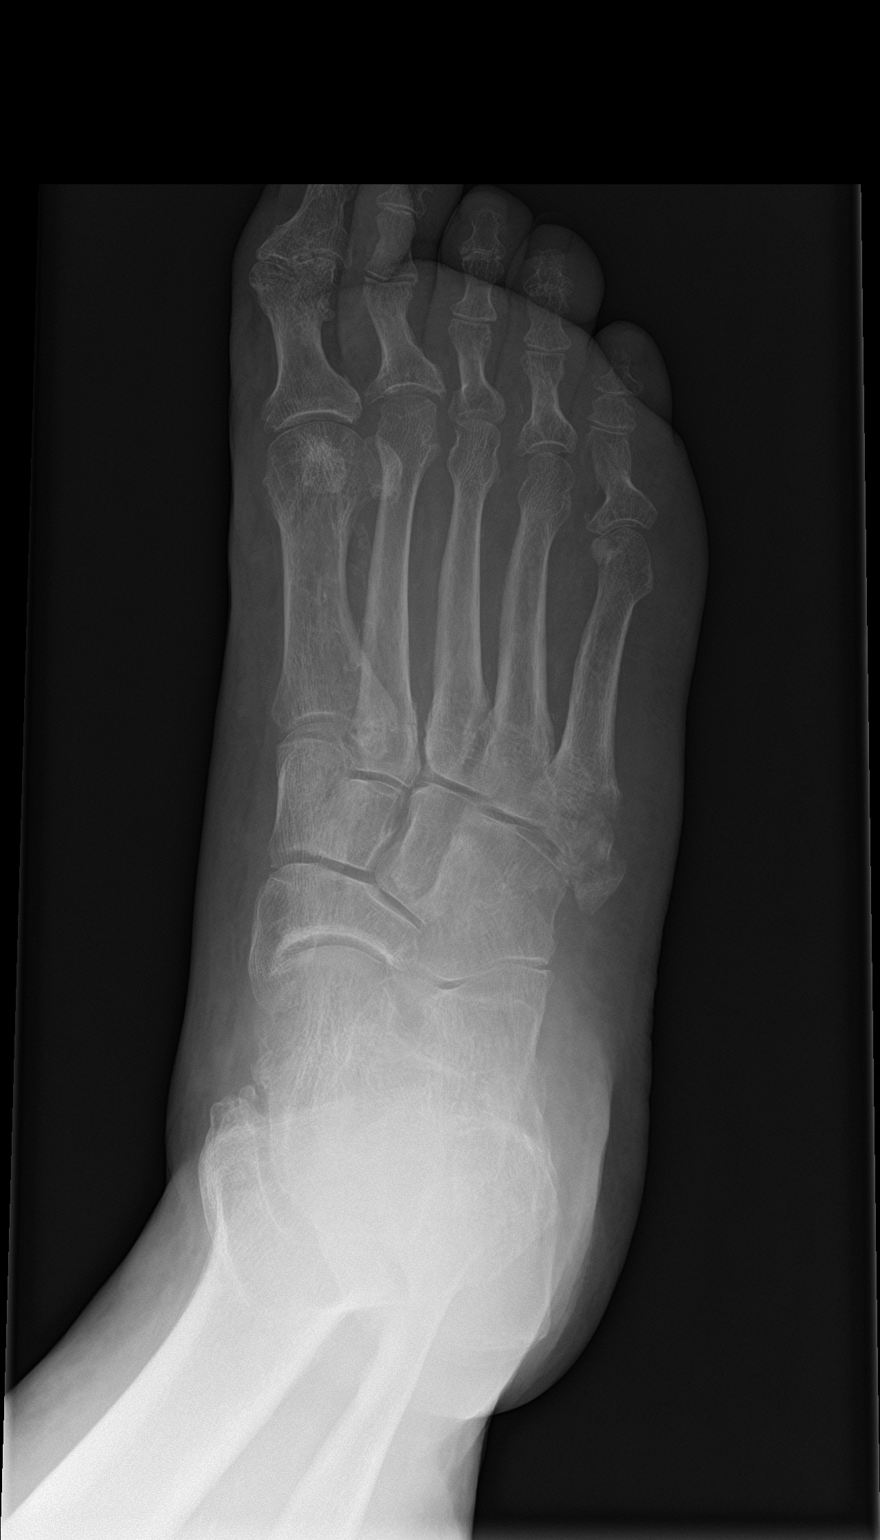

[foot lat]
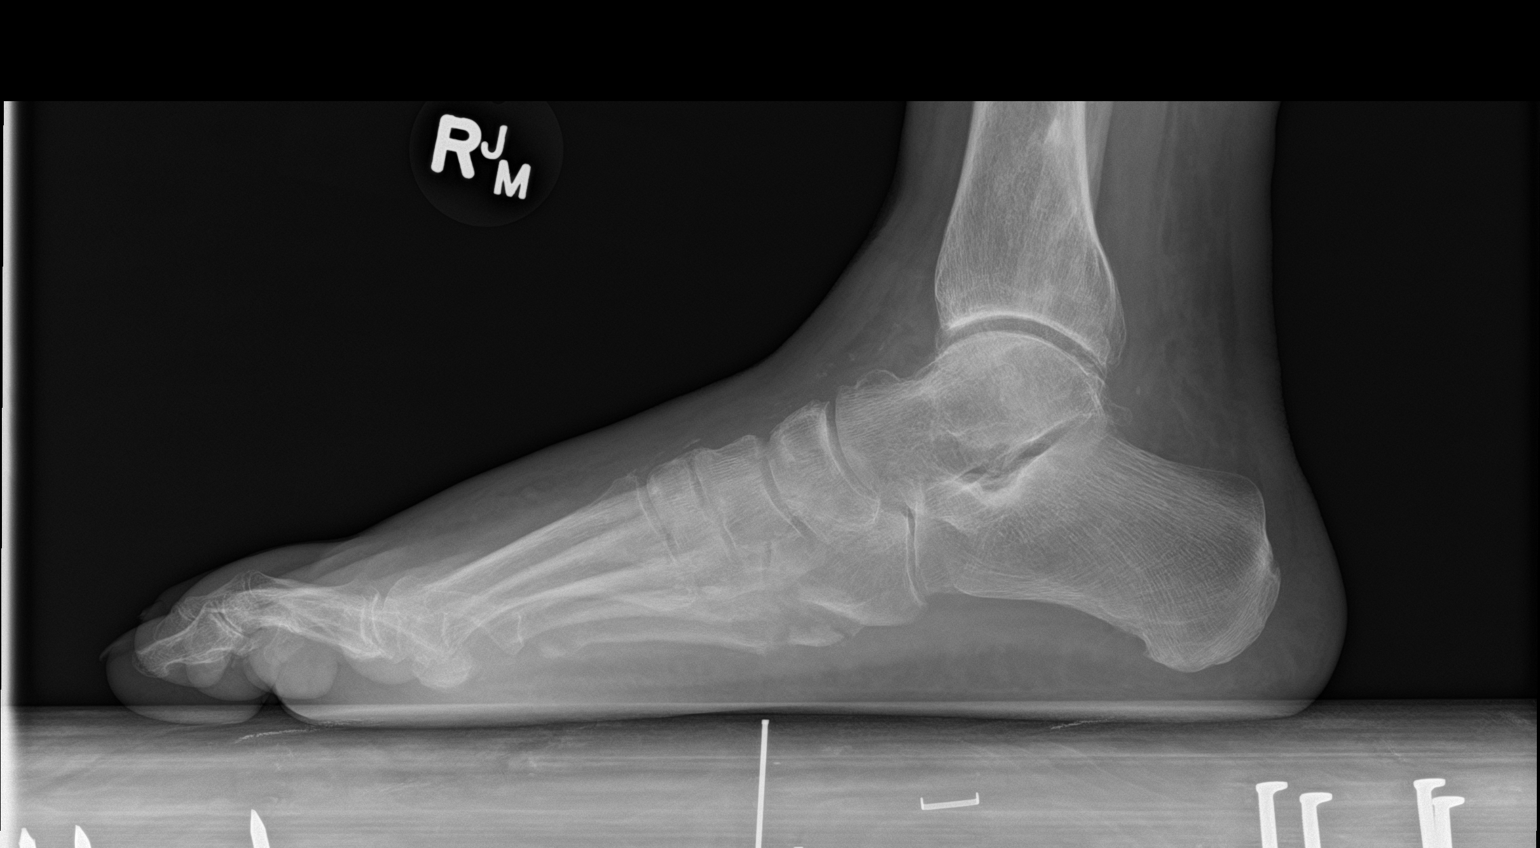

[3 of 3 positions shown; findings below may reference images not displayed]

FINDINGS: Comminuted fracture the base of the fifth toe is probably an
avulsion fracture rather than a Jones fracture. The comminution is
an unusual feature.

There appears to be a transverse fracture of the proximal metaphysis
of the second digit metatarsal. This raises concern for Lisfranc
joint instability although there is no current malalignment.

Abnormal vascular calcifications. Erosions along the interphalangeal
joint of the great toe. Degenerative arthropathy at the fifth MTP
joint.

Pes planus. Small plantar and Achilles calcaneal spurs. Dorsal
subcutaneous edema in the foot.
IMPRESSION: 1. New nondisplaced transverse fracture the proximal metaphysis of
the second metatarsal. This raises concern for Lisfranc joint
instability although there is no current malalignment.
2. Comminuted fracture the base of the fifth metatarsal. In general
this appears to extend into the proximal articulation and
accordingly is probably an avulsion fracture instead of a Jones
fracture, the comminution being an unusual feature.
3. Degenerative spurring in the interphalangeal joints and the MTP
joints. Possible erosions along the interphalangeal joint of the
great toe.
4. Pes planus.

## 2018-08-23 IMAGING — DX DG FOOT COMPLETE 3+V*R*
3 series · 3 of 3 positions shown · non-contrast
Comparison: Plain films the right foot 02/23/2016 and 12/01/2015.
MRI right foot 03/12/2016.

CLINICAL DATA: Right foot pain. History of Charcot arthropathy. No
known injury.

EXAM:
RIGHT FOOT COMPLETE - 3+ VIEW

[foot obl]
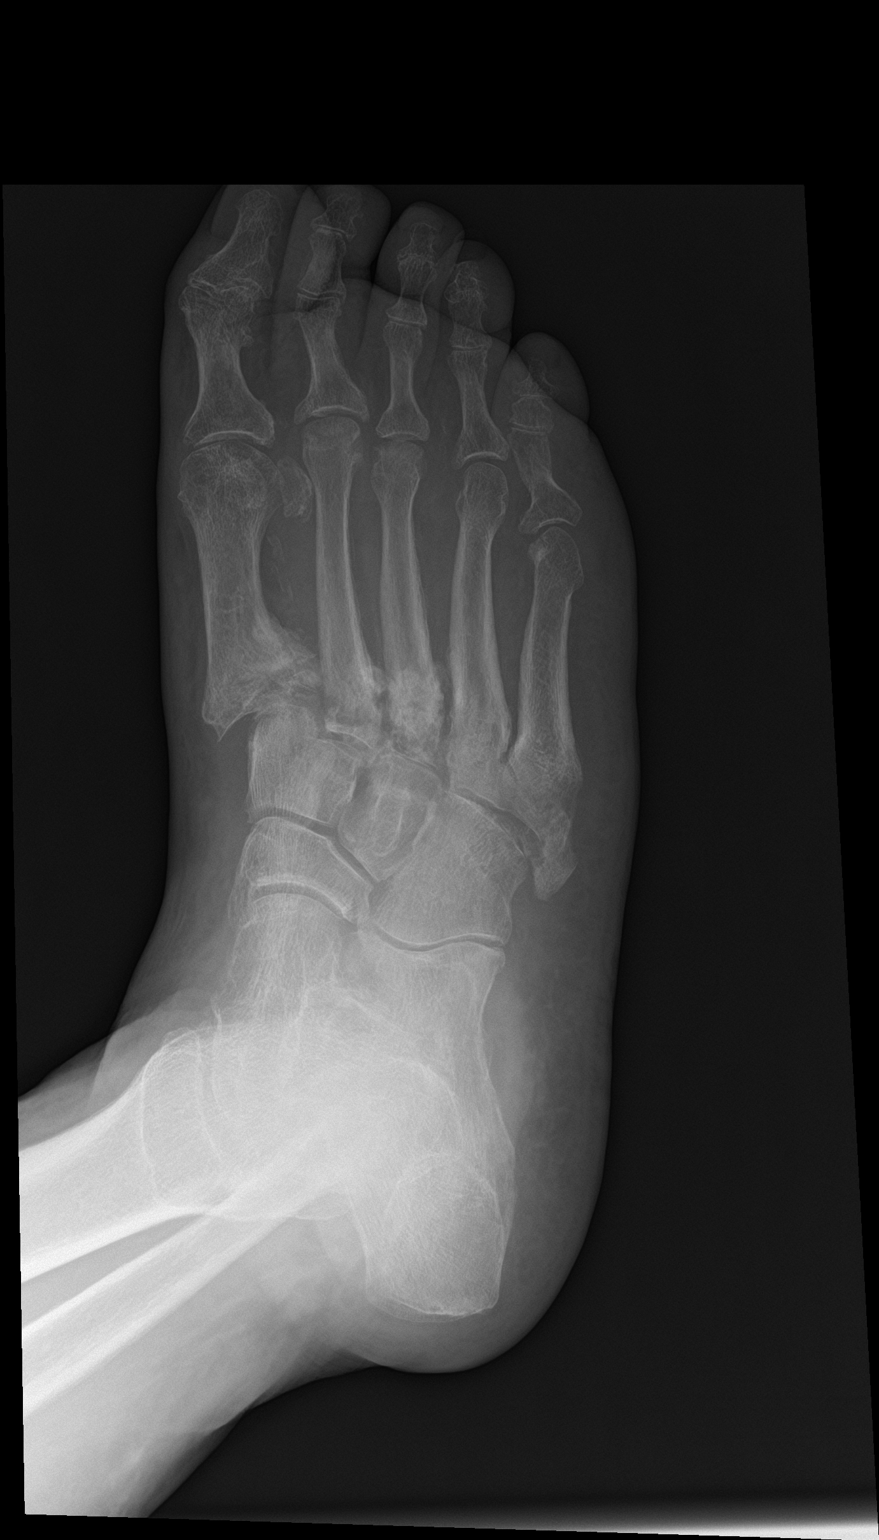

[foot lat]
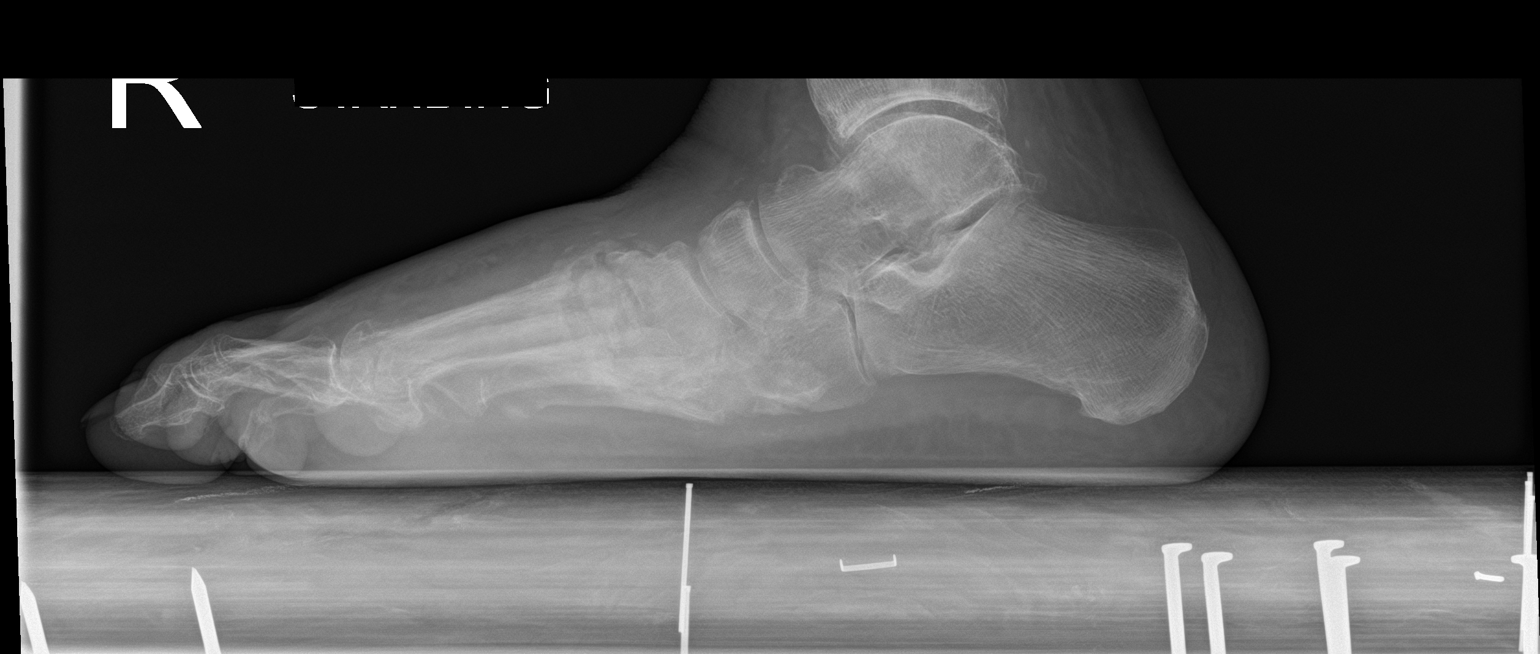

[foot ap]
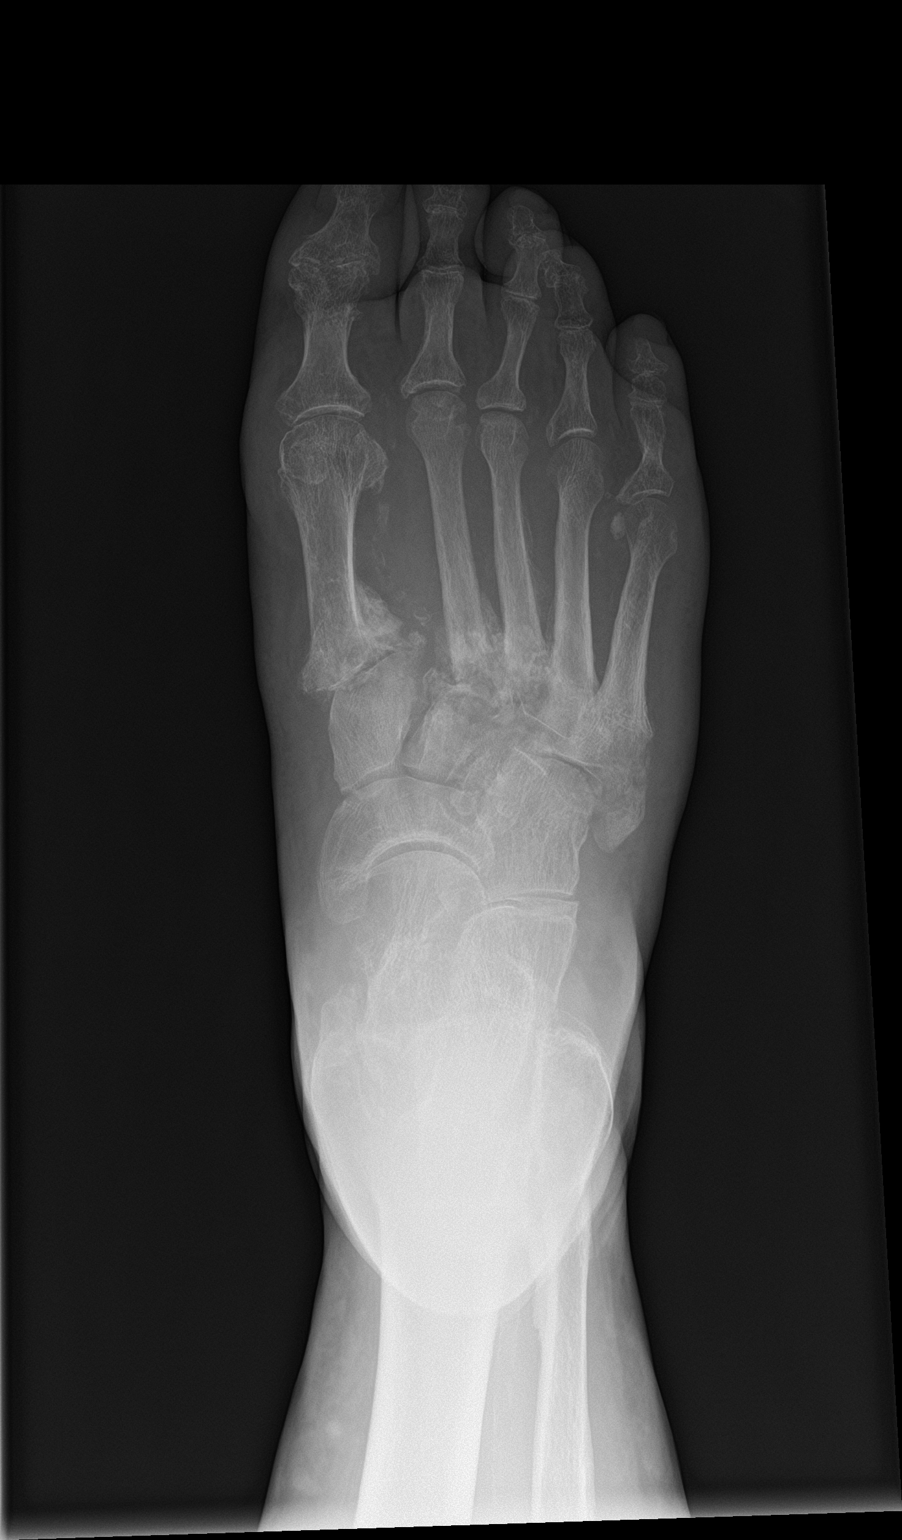

[3 of 3 positions shown; findings below may reference images not displayed]

FINDINGS: Extensive destructive change and disorganized appearance of the
tarsometatarsal joints is again seen and not notably changed since
the most recent plain films. There is medial subluxation of the
first metatarsal on the medial cuneiform and lateral subluxation of
the second metatarsal on the middle cuneiform, unchanged. Remote
fracture of the base of the fifth metatarsal is identified.
Osteoarthritis is seen at the IP joint of the great toe. Soft tissue
swelling is present about the foot. Atherosclerosis noted.
IMPRESSION: No acute abnormality.

Charcot change of the midfoot.

Remote fracture base of the right fifth metatarsal.

Osteoarthritis IP joint right great toe.

Soft tissue swelling.
